# Patient Record
Sex: Male | Born: 1950 | Race: White | Hispanic: No | Marital: Married | Smoking: Current every day smoker
Health system: Southern US, Community
[De-identification: ages and names within clinical notes are randomized; demographics above are authoritative.]

## PROBLEM LIST (undated history)

## (undated) DIAGNOSIS — C7951 Secondary malignant neoplasm of bone: Secondary | ICD-10-CM

## (undated) DIAGNOSIS — C3490 Malignant neoplasm of unspecified part of unspecified bronchus or lung: Secondary | ICD-10-CM

## (undated) DIAGNOSIS — R197 Diarrhea, unspecified: Secondary | ICD-10-CM

## (undated) DIAGNOSIS — Z923 Personal history of irradiation: Secondary | ICD-10-CM

## (undated) DIAGNOSIS — I499 Cardiac arrhythmia, unspecified: Secondary | ICD-10-CM

## (undated) DIAGNOSIS — I639 Cerebral infarction, unspecified: Secondary | ICD-10-CM

## (undated) DIAGNOSIS — I4891 Unspecified atrial fibrillation: Secondary | ICD-10-CM

## (undated) DIAGNOSIS — I82409 Acute embolism and thrombosis of unspecified deep veins of unspecified lower extremity: Secondary | ICD-10-CM

## (undated) DIAGNOSIS — C801 Malignant (primary) neoplasm, unspecified: Secondary | ICD-10-CM

## (undated) DIAGNOSIS — R42 Dizziness and giddiness: Secondary | ICD-10-CM

## (undated) DIAGNOSIS — Z973 Presence of spectacles and contact lenses: Secondary | ICD-10-CM

## (undated) DIAGNOSIS — F1010 Alcohol abuse, uncomplicated: Secondary | ICD-10-CM

## (undated) DIAGNOSIS — G4730 Sleep apnea, unspecified: Secondary | ICD-10-CM

## (undated) DIAGNOSIS — M1990 Unspecified osteoarthritis, unspecified site: Secondary | ICD-10-CM

## (undated) DIAGNOSIS — K219 Gastro-esophageal reflux disease without esophagitis: Secondary | ICD-10-CM

## (undated) HISTORY — PX: TONSILLECTOMY: SUR1361

## (undated) HISTORY — DX: Dizziness and giddiness: R42

## (undated) HISTORY — DX: Cerebral infarction, unspecified: I63.9

---
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
## 1969-07-05 HISTORY — PX: RETINAL DETACHMENT SURGERY: SHX105

## 2000-12-30 ENCOUNTER — Emergency Department (HOSPITAL_COMMUNITY): Admission: EM | Admit: 2000-12-30 | Discharge: 2000-12-31 | Payer: Self-pay | Admitting: Emergency Medicine

## 2001-07-05 HISTORY — PX: ANKLE FRACTURE SURGERY: SHX122

## 2001-08-21 ENCOUNTER — Emergency Department (HOSPITAL_COMMUNITY): Admission: EM | Admit: 2001-08-21 | Discharge: 2001-08-21 | Payer: Self-pay | Admitting: Emergency Medicine

## 2001-08-22 ENCOUNTER — Encounter: Payer: Self-pay | Admitting: Orthopedic Surgery

## 2001-08-22 ENCOUNTER — Ambulatory Visit (HOSPITAL_COMMUNITY): Admission: RE | Admit: 2001-08-22 | Discharge: 2001-08-23 | Payer: Self-pay | Admitting: Orthopedic Surgery

## 2001-11-17 ENCOUNTER — Ambulatory Visit (HOSPITAL_COMMUNITY): Admission: RE | Admit: 2001-11-17 | Discharge: 2001-11-17 | Payer: Self-pay | Admitting: *Deleted

## 2016-07-05 HISTORY — PX: COLONOSCOPY: SHX174

## 2017-04-04 HISTORY — PX: OTHER SURGICAL HISTORY: SHX169

## 2017-07-05 DIAGNOSIS — I639 Cerebral infarction, unspecified: Secondary | ICD-10-CM

## 2017-07-05 HISTORY — DX: Cerebral infarction, unspecified: I63.9

## 2017-08-09 ENCOUNTER — Other Ambulatory Visit: Payer: Self-pay | Admitting: Family Medicine

## 2017-08-09 DIAGNOSIS — R200 Anesthesia of skin: Secondary | ICD-10-CM

## 2017-08-16 ENCOUNTER — Ambulatory Visit
Admission: RE | Admit: 2017-08-16 | Discharge: 2017-08-16 | Disposition: A | Discharge status: Home / Self Care | Payer: Medicare Other | Source: Ambulatory Visit | Attending: Family Medicine | Admitting: Family Medicine

## 2017-08-16 DIAGNOSIS — R200 Anesthesia of skin: Secondary | ICD-10-CM

## 2017-08-18 ENCOUNTER — Other Ambulatory Visit: Payer: Self-pay | Admitting: Family Medicine

## 2017-08-18 DIAGNOSIS — R200 Anesthesia of skin: Secondary | ICD-10-CM

## 2017-09-02 ENCOUNTER — Ambulatory Visit
Admission: RE | Admit: 2017-09-02 | Discharge: 2017-09-02 | Disposition: A | Discharge status: Home / Self Care | Payer: Medicare Other | Source: Ambulatory Visit | Attending: Family Medicine | Admitting: Family Medicine

## 2017-09-02 DIAGNOSIS — R200 Anesthesia of skin: Secondary | ICD-10-CM

## 2017-09-02 MED ORDER — GADOBENATE DIMEGLUMINE 529 MG/ML IV SOLN
17.0000 mL | Freq: Once | INTRAVENOUS | Status: AC | PRN
  Administered 2017-09-02: 17 mL via INTRAVENOUS

## 2017-11-08 ENCOUNTER — Encounter: Payer: Self-pay | Admitting: Diagnostic Neuroimaging

## 2017-11-08 ENCOUNTER — Ambulatory Visit: Payer: Medicare Other | Admitting: Diagnostic Neuroimaging

## 2017-11-08 VITALS — BP 157/84 | HR 52 | Ht 68.0 in | Wt 183.0 lb

## 2017-11-08 DIAGNOSIS — I639 Cerebral infarction, unspecified: Secondary | ICD-10-CM | POA: Diagnosis not present

## 2017-11-08 DIAGNOSIS — I6381 Other cerebral infarction due to occlusion or stenosis of small artery: Secondary | ICD-10-CM

## 2017-11-08 MED ORDER — ASPIRIN 81 MG PO TABS: 81.0000 mg | ORAL_TABLET | Freq: Every day | ORAL | Status: DC

## 2017-11-08 NOTE — Progress Notes (Signed)
[facility]  PATIENT: Albert Perez DOB: 1951/03/04  REFERRING CLINICIAN: Charlynne Cousins, MD HISTORY FROM: patient  REASON FOR VISIT: new consult    HISTORICAL  CHIEF COMPLAINT:  Chief Complaint  Patient presents with  . New Patient (Initial Visit)    Numbness R thumb and R lip  . Referral    Aura Dials, MD  . Room 7    He is here alone    HISTORY OF PRESENT ILLNESS:   67 year old male here for evaluation of right hand and right mouth numbness.  October 2018 patient woke up and had sudden onset of right thumb and right lip numbness.  He had some word finding difficulty.  He did not seek medical attention for this.  Since that time symptoms have persisted but is slightly improved.  No problem with weakness.  No problem with left side of the body.  Patient does endorse at least half pack of cigarettes per day.  He also "drinks too much" alcohol, including beer and hard liquor.  He does have some high blood pressure but is trying to control this with diet and exercise.  He states his systolic blood pressure ranges in the 130 - 150 range.    REVIEW OF SYSTEMS: Full 14 system review of systems performed and negative with exception of: Numbness snoring moles melanoma.  ALLERGIES: Not on File  HOME MEDICATIONS: Outpatient Medications Prior to Visit  Medication Sig Dispense Refill  . aspirin 325 MG tablet Take 325 mg by mouth daily.    . meloxicam (MOBIC) 15 MG tablet Take 15 mg by mouth as needed for pain.     No facility-administered medications prior to visit.     PAST MEDICAL HISTORY: Past Medical History:  Diagnosis Date  . Vertigo     PAST SURGICAL HISTORY: Past Surgical History:  Procedure Laterality Date  . ANKLE FRACTURE SURGERY Right 2003  . COLONOSCOPY  2018  . ELBOW SURGERY Left 1964  . melanoma removal  04/2017   Lt shoulder  . RETINAL DETACHMENT SURGERY Right 1971    FAMILY HISTORY: Family History  Problem Relation Age of  Onset  . Aneurysm Mother   . Colon cancer Brother   . Prostate cancer Brother     SOCIAL HISTORY:  Social History   Socioeconomic History  . Marital status: Married    Spouse name: Not on file  . Number of children: 2  . Years of education: 60  . Highest education level: Some college, no degree  Occupational History  . Not on file  Social Needs  . Financial resource strain: Not on file  . Food insecurity:    Worry: Not on file    Inability: Not on file  . Transportation needs:    Medical: Not on file    Non-medical: Not on file  Tobacco Use  . Smoking status: Current Every Day Smoker    Packs/day: 0.50    Types: Cigarettes, Cigars  . Smokeless tobacco: Never Used  Substance and Sexual Activity  . Alcohol use: Yes    Alcohol/week: 14.4 oz    Types: 24 Cans of beer per week  . Drug use: Not Currently    Comment: marijuana rare  . Sexual activity: Not on file  Lifestyle  . Physical activity:    Days per week: Not on file    Minutes per session: Not on file  . Stress: Not on file  Relationships  . Social connections:    Talks  on phone: Not on file    Gets together: Not on file    Attends religious service: Not on file    Active member of club or organization: Not on file    Attends meetings of clubs or organizations: Not on file    Relationship status: Not on file  . Intimate partner violence:    Fear of current or ex partner: Not on file    Emotionally abused: Not on file    Physically abused: Not on file    Forced sexual activity: Not on file  Other Topics Concern  . Not on file  Social History Narrative   Lives at home with wife, daughter and grandson   Right handed   Caffeine: sodas 2 per day     PHYSICAL EXAM  GENERAL EXAM/CONSTITUTIONAL: Vitals:  Vitals:   11/08/17 1255  BP: (!) 157/84  Pulse: (!) 52  Weight: 183 lb (83 kg)  Height: 5\' 8"  (1.727 m)   Body mass index is 27.83 kg/m. No exam data present  Patient is in no distress; well  developed, nourished and groomed; neck is supple  CARDIOVASCULAR:  Examination of carotid arteries is normal; no carotid bruits  Regular rate and rhythm, no murmurs  Examination of peripheral vascular system by observation and palpation is normal  EYES:  Ophthalmoscopic exam of optic discs and posterior segments is normal; no papilledema or hemorrhages  MUSCULOSKELETAL:  Gait, strength, tone, movements noted in Neurologic exam below  NEUROLOGIC: MENTAL STATUS:  No flowsheet data found.  awake, alert, oriented to person, place and time  recent and remote memory intact  normal attention and concentration  language fluent, comprehension intact, naming intact,   fund of knowledge appropriate  CRANIAL NERVE:   2nd - no papilledema on fundoscopic exam  2nd, 3rd, 4th, 6th - pupils equal and reactive to light, visual fields full to confrontation, extraocular muscles intact, no nystagmus  5th - facial sensation --> DECR IN RIGHT LIP  7th - facial strength symmetric  8th - hearing intact  9th - palate elevates symmetrically, uvula midline  11th - shoulder shrug symmetric  12th - tongue protrusion midline  MOTOR:   normal bulk and tone, full strength in the BUE, BLE  SENSORY:   normal and symmetric to light touch, temperature, vibration; SLIGHTLY DECR IN RIGHT HAND AND RIGHT LIP  COORDINATION:   finger-nose-finger, fine finger movements normal  REFLEXES:   deep tendon reflexes present and symmetric  GAIT/STATION:   narrow based gait; able to walk tandem; romberg is negative    DIAGNOSTIC DATA (LABS, IMAGING, TESTING) - I reviewed patient records, labs, notes, testing and imaging myself where available.  No results found for: WBC, HGB, HCT, MCV, PLT No results found for: NA, K, CL, CO2, GLUCOSE, BUN, CREATININE, CALCIUM, PROT, ALBUMIN, AST, ALT, ALKPHOS, BILITOT, GFRNONAA, GFRAA No results found for: CHOL, HDL, LDLCALC, LDLDIRECT, TRIG, CHOLHDL No  results found for: HGBA1C No results found for: VITAMINB12 No results found for: TSH   09/02/17 MRI brain [I reviewed images myself and agree with interpretation. Also there is probable chronic left thalamic ischemic infarction. -VRP]   Brain: There is no evidence of acute infarct, intracranial hemorrhage, mass, midline shift, or extra-axial fluid collection. The ventricles and sulci are normal for age. Scattered T2 hyperintensities in the cerebral white matter are nonspecific but compatible with mild chronic small vessel ischemic disease. There are 2 small foci of FLAIR hyperintensity versus artifact in the inferomedial cerebellar hemispheres, not confirmed  on standard T2 sequences. Chronic lacunar infarcts versus dilated perivascular spaces are present in the right caudate body and left thalamus. No abnormal enhancement is identified.  Vascular: Major intracranial vascular flow voids are preserved.  Skull and upper cervical spine: Unremarkable bone marrow signal.  Sinuses/Orbits: Right scleral banding. Minimal scattered paranasal sinus mucosal thickening. Clear mastoid air cells.  Other: None.  IMPRESSION: 1. No acute intracranial abnormality. 2. Mild chronic small vessel ischemic disease.     ASSESSMENT AND PLAN  67 y.o. year old male here with new onset of right hand and right mouth numbness, most likely related to chronic left thalamic ischemic infarction, noted on MRI of the brain.  This is likely due to small vessel thrombosis, related to patient's history of hypertension, smoking and alcohol abuse.  Will complete stroke work-up with echocardiogram.  Patient should continue on aspirin 81 mg/day.  Also should treat underlying risk factors including hypertension.  In future could consider sleep study to rule out sleep apnea. Follow up lipid panel with PCP.    Dx:  1. Thalamic stroke (HCC)     PLAN: - aspirin 81mg  daily - follow up BP with PCP; monitor BP at  home - reduce alcohol (caution with withdrawal) - stop cigarette smoking - check TTE  Orders Placed This Encounter  Procedures  . ECHOCARDIOGRAM COMPLETE   Meds ordered this encounter  Medications  . aspirin 81 MG tablet    Sig: Take 1 tablet (81 mg total) by mouth daily.   Return in about 6 months (around 05/11/2018) for with NP Venancio Poisson).  I reviewed images, labs, notes, records myself. I summarized findings and reviewed with patient, for this high risk condition (stroke) requiring high complexity decision making.     Penni Bombard, MD 09/11/5318, 2:33 PM Certified in Neurology, Neurophysiology and Neuroimaging  [facility] [address] [place], [place] [address] [phone]

## 2017-11-08 NOTE — Patient Instructions (Addendum)
-   aspirin 81mg  daily  - follow up BP with PCP; monitor BP at home (omron BP monitor)  - gradually reduce alcohol (caution with withdrawal)  - stop cigarette smoking  - check ultrasound of heart (echocardiogram)

## 2017-11-16 ENCOUNTER — Other Ambulatory Visit: Payer: Self-pay

## 2017-11-16 ENCOUNTER — Ambulatory Visit (HOSPITAL_COMMUNITY): Payer: Medicare Other | Attending: Cardiovascular Disease

## 2017-11-16 DIAGNOSIS — I639 Cerebral infarction, unspecified: Secondary | ICD-10-CM | POA: Diagnosis present

## 2017-11-16 DIAGNOSIS — I6381 Other cerebral infarction due to occlusion or stenosis of small artery: Secondary | ICD-10-CM

## 2017-11-16 DIAGNOSIS — I119 Hypertensive heart disease without heart failure: Secondary | ICD-10-CM | POA: Insufficient documentation

## 2017-11-16 DIAGNOSIS — Z720 Tobacco use: Secondary | ICD-10-CM | POA: Diagnosis not present

## 2017-11-18 ENCOUNTER — Telehealth: Payer: Self-pay | Admitting: *Deleted

## 2017-11-18 NOTE — Telephone Encounter (Signed)
Pt returned RN's call. Please call to advise

## 2017-11-18 NOTE — Telephone Encounter (Signed)
-----   Message from Penni Bombard, MD sent at 11/17/2017  3:17 PM EDT ----- Unremarkable study. No major findings. Please call patient. Continue current plan. -VRP

## 2017-11-18 NOTE — Telephone Encounter (Signed)
LMVM for pt on his home # that Echocardiogram is unremarkable, no major findings per Dr. Leta Baptist.  Continue current plan.  Pt is to return call if questions.

## 2018-05-10 ENCOUNTER — Ambulatory Visit: Payer: Medicare Other | Admitting: Adult Health

## 2018-05-10 ENCOUNTER — Encounter: Payer: Self-pay | Admitting: Adult Health

## 2018-05-10 VITALS — BP 125/73 | HR 56 | Ht 68.0 in | Wt 185.0 lb

## 2018-05-10 DIAGNOSIS — I10 Essential (primary) hypertension: Secondary | ICD-10-CM | POA: Diagnosis not present

## 2018-05-10 DIAGNOSIS — I639 Cerebral infarction, unspecified: Secondary | ICD-10-CM | POA: Diagnosis not present

## 2018-05-10 DIAGNOSIS — E785 Hyperlipidemia, unspecified: Secondary | ICD-10-CM | POA: Diagnosis not present

## 2018-05-10 DIAGNOSIS — I6381 Other cerebral infarction due to occlusion or stenosis of small artery: Secondary | ICD-10-CM

## 2018-05-10 MED ORDER — NICOTINE 14 MG/24HR TD PT24: 14.0000 mg | MEDICATED_PATCH | Freq: Every day | TRANSDERMAL | 0 refills | Status: DC

## 2018-05-10 MED ORDER — BUPROPION HCL ER (SMOKING DET) 150 MG PO TB12: 150.0000 mg | ORAL_TABLET | Freq: Two times a day (BID) | ORAL | 2 refills | Status: DC

## 2018-05-10 NOTE — Progress Notes (Signed)
[facility]  PATIENT: Albert Perez DOB: [birth year]  REFERRING CLINICIAN: Charlynne Cousins, MD HISTORY FROM: patient  REASON FOR VISIT: new consult    HISTORICAL  CHIEF COMPLAINT:  Chief Complaint  Patient presents with  . Follow-up    Follow up for stroke room in back hallway pt alone    HISTORY OF PRESENT ILLNESS:  Initial visit 11/08/2017 VP:  67 year old male here for evaluation of right hand and right mouth numbness.  October 2018 patient woke up and had sudden onset of right thumb and right lip numbness.  He had some word finding difficulty.  He did not seek medical attention for this.  Since that time symptoms have persisted but is slightly improved.  No problem with weakness.  No problem with left side of the body.  Patient does endorse at least half pack of cigarettes per day.  He also "drinks too much" alcohol, including beer and hard liquor.  He does have some high blood pressure but is trying to control this with diet and exercise.  He states his systolic blood pressure ranges in the 130 - 150 range.   Interval history 05/10/2018: Patient is being seen today for follow-up visit.  He did undergo echocardiogram which was unremarkable.  He does say he continues to have right hand numbness but only mild lip numbness with improvement.  He continues to take aspirin 81 mg without side effects of bleeding or bruising.  Continues to take Crestor 5 mg without side effects myalgias.  Blood pressure today satisfactory 125/73.  He does state that he is currently smoking half pack of cigarettes per day and did attempt to use nicotine 21 mg patches but endorses rash despite rotating sites.  While using pack, he was able to decrease from 10 cigarettes daily to 4 cigarettes daily.  He is very motivated to quit.  He does continue to drink alcohol but has greatly decreased.  He does endorse OSA symptoms such as snoring, daytime fatigue, and easily napping.  Denies any prior work-up  with sleep study in the past.  No further concerns at this time.  Denies new or worsening stroke/TIA symptoms.   REVIEW OF SYSTEMS: Full 14 system review of systems performed and negative with exception of: Ringing in ears, leg swelling, snoring, joint pain, muscle cramps, moles, memory loss and numbness  ALLERGIES: No Known Allergies  HOME MEDICATIONS: Outpatient Medications Prior to Visit  Medication Sig Dispense Refill  . aspirin 81 MG tablet Take 1 tablet (81 mg total) by mouth daily.    . Cyanocobalamin (B-12 PO) Take 1,000 mg by mouth.    . Omega-3 1000 MG CAPS Take 2,000 mg by mouth.     . rosuvastatin (CRESTOR) 5 MG tablet Take 5 mg by mouth daily.  2  . meloxicam (MOBIC) 15 MG tablet Take 15 mg by mouth as needed for pain.    . TURMERIC PO Take by mouth.     No facility-administered medications prior to visit.     PAST MEDICAL HISTORY: Past Medical History:  Diagnosis Date  . Stroke ([place])   . Vertigo     PAST SURGICAL HISTORY: Past Surgical History:  Procedure Laterality Date  . ANKLE FRACTURE SURGERY Right 2003  . COLONOSCOPY  2018  . ELBOW SURGERY Left 1964  . melanoma removal  04/2017   Lt shoulder  . RETINAL DETACHMENT SURGERY Right 1971    FAMILY HISTORY: Family History  Problem Relation Age of Onset  . Aneurysm  Mother   . Colon cancer Brother   . Prostate cancer Brother     SOCIAL HISTORY:  Social History   Socioeconomic History  . Marital status: Married    Spouse name: Not on file  . Number of children: 2  . Years of education: 54  . Highest education level: Some college, no degree  Occupational History  . Not on file  Social Needs  . Financial resource strain: Not on file  . Food insecurity:    Worry: Not on file    Inability: Not on file  . Transportation needs:    Medical: Not on file    Non-medical: Not on file  Tobacco Use  . Smoking status: Current Every Day Smoker    Packs/day: 0.50    Types: Cigarettes  . Smokeless  tobacco: Never Used  Substance and Sexual Activity  . Alcohol use: Yes    Alcohol/week: 24.0 standard drinks    Types: 24 Cans of beer per week  . Drug use: Not Currently    Comment: marijuana rare  . Sexual activity: Not on file  Lifestyle  . Physical activity:    Days per week: Not on file    Minutes per session: Not on file  . Stress: Not on file  Relationships  . Social connections:    Talks on phone: Not on file    Gets together: Not on file    Attends religious service: Not on file    Active member of club or organization: Not on file    Attends meetings of clubs or organizations: Not on file    Relationship status: Not on file  . Intimate partner violence:    Fear of current or ex partner: Not on file    Emotionally abused: Not on file    Physically abused: Not on file    Forced sexual activity: Not on file  Other Topics Concern  . Not on file  Social History Narrative   Lives at home with wife, daughter and grandson   Right handed   Caffeine: sodas 2 per day     PHYSICAL EXAM  GENERAL EXAM/CONSTITUTIONAL: Vitals:  Vitals:   05/10/18 1519  BP: 125/73  Pulse: (!) 56  Weight: 185 lb (83.9 kg)  Height: 5\' 8"  (1.727 m)   Body mass index is 28.13 kg/m. No exam data present  Patient is in no distress; well developed, nourished and groomed; neck is supple  CARDIOVASCULAR:  Examination of carotid arteries is normal; no carotid bruits  Regular rate and rhythm, no murmurs  Examination of peripheral vascular system by observation and palpation is normal  EYES:  Ophthalmoscopic exam of optic discs and posterior segments is normal; no papilledema or hemorrhages  MUSCULOSKELETAL:  Gait, strength, tone, movements noted in Neurologic exam below  NEUROLOGIC: MENTAL STATUS:  No flowsheet data found.  awake, alert, oriented to person, place and time  recent and remote memory intact  normal attention and concentration  language fluent, comprehension  intact, naming intact,   fund of knowledge appropriate  CRANIAL NERVE:   2nd - no papilledema on fundoscopic exam  2nd, 3rd, 4th, 6th - pupils equal and reactive to light, visual fields full to confrontation, extraocular muscles intact, no nystagmus  5th - facial sensation intact  7th - facial strength symmetric  8th - hearing intact  9th - palate elevates symmetrically, uvula midline  11th - shoulder shrug symmetric  12th - tongue protrusion midline  MOTOR:   normal bulk  and tone, full strength in the BUE, BLE  SENSORY:   normal and symmetric to light touch, temperature, vibration; SLIGHTLY DECR IN RIGHT HAND   COORDINATION:   finger-nose-finger, fine finger movements normal  REFLEXES:   deep tendon reflexes present and symmetric  GAIT/STATION:   narrow based gait; able to walk tandem; romberg is negative    DIAGNOSTIC DATA (LABS, IMAGING, TESTING) - I reviewed patient records, labs, notes, testing and imaging myself where available.  No results found for: WBC, HGB, HCT, MCV, PLT No results found for: NA, K, CL, CO2, GLUCOSE, BUN, CREATININE, CALCIUM, PROT, ALBUMIN, AST, ALT, ALKPHOS, BILITOT, GFRNONAA, GFRAA No results found for: CHOL, HDL, LDLCALC, LDLDIRECT, TRIG, CHOLHDL No results found for: HGBA1C No results found for: VITAMINB12 No results found for: TSH   09/02/17 MRI brain [I reviewed images myself and agree with interpretation. Also there is probable chronic left thalamic ischemic infarction. -VRP]   Brain: There is no evidence of acute infarct, intracranial hemorrhage, mass, midline shift, or extra-axial fluid collection. The ventricles and sulci are normal for age. Scattered T2 hyperintensities in the cerebral white matter are nonspecific but compatible with mild chronic small vessel ischemic disease. There are 2 small foci of FLAIR hyperintensity versus artifact in the inferomedial cerebellar hemispheres, not confirmed on standard  T2 sequences. Chronic lacunar infarcts versus dilated perivascular spaces are present in the right caudate body and left thalamus. No abnormal enhancement is identified.  Vascular: Major intracranial vascular flow voids are preserved.  Skull and upper cervical spine: Unremarkable bone marrow signal.  Sinuses/Orbits: Right scleral banding. Minimal scattered paranasal sinus mucosal thickening. Clear mastoid air cells.  Other: None.  IMPRESSION: 1. No acute intracranial abnormality. 2. Mild chronic small vessel ischemic disease.     ASSESSMENT AND PLAN  67 y.o. year old male who is being seen today for follow-up of left thalamic infarct most likely secondary to small vessel disease.  Vascular risk factors include HTN, HLD, tobacco use and alcohol use.  He returns today for follow-up evaluation and overall has been stable with residual right hand numbness but otherwise has been stable without new or worsening stroke/TIA symptoms.   Dx:  1. Thalamic stroke ([place])   2. Essential hypertension   3. Hyperlipidemia with target LDL less than 70     -Continue aspirin 81 mg daily  and Crestor for secondary stroke prevention -F/u with PCP regarding your HLD and HTN management -Advised to trial nicotine patches 14 mg to see if lesser dose causes skin irritation as well.  Also recommended to start Zyban 150 mg daily for 3 days and then increase to 150 mg twice daily to assist with smoking cessation -Referral placed for sleep study to [facility] -Advised to continue to stay active and maintain a healthy diet -continue to monitor BP at home -Maintain strict control of hypertension with blood pressure goal below 130/90, diabetes with hemoglobin A1c goal below 6.5% and cholesterol with LDL cholesterol (bad cholesterol) goal below 70 mg/dL. I also advised the patient to eat a healthy diet with plenty of whole grains, cereals, fruits and vegetables, exercise regularly and maintain ideal  body weight.  Follow up in 6 months or call earlier if needed  Greater than 50% of time during this 25 minute visit was spent on counseling,explanation of diagnosis of left thalamic infarct, reviewing risk factor management of HTN, HLD, alcohol use and continued tobacco use, planning of further management, discussion with patient and family and coordination of care  Venancio Poisson, AGNP-BC  [facility] [address] [place], [place] [address]  Phone [phone] Fax [phone] Note: This document was prepared with digital dictation and possible smart phrase technology. Any transcriptional errors that result from this process are unintentional.

## 2018-05-10 NOTE — Patient Instructions (Signed)
Continue aspirin 81 mg daily  and Crestor 5mg   for secondary stroke prevention  Continue to follow up with PCP regarding cholesterol and blood pressure management   Use nicotine patches 14mg  along with starting Zyban 150mg  daily for 3 days and then increase to 150mg  twice a day. By week 2, you should stop smoking at this time.   You will be call to schedule sleep apnea testing  Follow up with orthopedic concerning knee pain   Continue to monitor blood pressure at home  Maintain strict control of hypertension with blood pressure goal below 130/90, diabetes with hemoglobin A1c goal below 6.5% and cholesterol with LDL cholesterol (bad cholesterol) goal below 70 mg/dL. I also advised the patient to eat a healthy diet with plenty of whole grains, cereals, fruits and vegetables, exercise regularly and maintain ideal body weight.  Followup in the future with me in 6 months or call earlier if needed       Thank you for coming to see [place] at [facility]. I hope we have been able to provide you high quality care today.  You may receive a patient satisfaction survey over the next few weeks. We would appreciate your feedback and comments so that we may continue to improve ourselves and the health of our patients.

## 2018-05-11 ENCOUNTER — Ambulatory Visit: Payer: Medicare Other | Admitting: Adult Health

## 2018-05-23 NOTE — Progress Notes (Signed)
I reviewed note and agree with plan.   VIKRAM R. PENUMALLI, MD  Certified in Neurology, Neurophysiology and Neuroimaging  [facility] [address], [address] [place], [place] [address] [phone]   

## 2018-07-19 ENCOUNTER — Ambulatory Visit: Payer: Medicare Other | Admitting: Neurology

## 2018-07-19 ENCOUNTER — Encounter: Payer: Self-pay | Admitting: Neurology

## 2018-07-19 VITALS — BP 155/75 | HR 54 | Ht 68.5 in | Wt 186.0 lb

## 2018-07-19 DIAGNOSIS — I10 Essential (primary) hypertension: Secondary | ICD-10-CM | POA: Diagnosis not present

## 2018-07-19 DIAGNOSIS — I6381 Other cerebral infarction due to occlusion or stenosis of small artery: Secondary | ICD-10-CM

## 2018-07-19 DIAGNOSIS — I639 Cerebral infarction, unspecified: Secondary | ICD-10-CM

## 2018-07-19 DIAGNOSIS — F10182 Alcohol abuse with alcohol-induced sleep disorder: Secondary | ICD-10-CM

## 2018-07-19 DIAGNOSIS — R0683 Snoring: Secondary | ICD-10-CM

## 2018-07-19 DIAGNOSIS — E785 Hyperlipidemia, unspecified: Secondary | ICD-10-CM | POA: Diagnosis not present

## 2018-07-19 DIAGNOSIS — Z8679 Personal history of other diseases of the circulatory system: Secondary | ICD-10-CM | POA: Insufficient documentation

## 2018-07-19 NOTE — Progress Notes (Signed)
SLEEP MEDICINE CLINIC   Provider:  Larey Seat, MD   Primary Care Physician:  Albert Dials, MD   Referring Provider: Nolberto Perez , MD and Albert Maudlin, NP    Chief Complaint  Patient presents with  . New Patient (Initial Visit)    pt alone, rm 10. pt states that he tends to go to bed late, states that he has snored for years. pt states he feels well rested when he wakes up. denies apnea that he knows of. goes to bed 1 am and wakes up 7:30 am.    HPI:  Albert Perez is a 68 y.o. male patient, who had suffered a stroke in 2019 and was seen in follow up by NP Albert Perez and Dr Albert Perez in 05-2018.  He is seen here on 07-19-2018 in a referral to evaluate him for sleep apnea.  The patient was seen by Albert Perez on 08 Nov 2017 being evaluated for right hand and right angle of the mouth numbness.  His symptoms were related to a stroke he also had some word finding difficulties initially but did not seek medical attention he did not feel any extremity weakness, gait instability or cognitive impairment.  Dr. Leta Perez noted that the patient does smoke and also drinks about his blood pressure at the time of his visit was slightly elevated.  On 10 May 2018 there was a follow-up visit his echocardiogram had meanwhile been obtained and was unremarkable.  Hand and lip numbness had slightly further improved he continue to take aspirin 81 mg Crestor 5 mg, blood pressure was well controlled 125/73 mmHg.  He continues to smoke but is down to half a pack a day and he has used nicotine patches.  He also has reduced his alcohol intake.  He did report symptoms of snoring, daytime fatigue and easily falling asleep when not stimulated or physically active.  He had no further sleep studies or sleep evaluations prior and was referred based on the above named concerns.  An MRI of the brain was obtained on 02 September 2017 which showed a left thalamic ischemic infarction and chronic lacunar infarcts.  The  right caudate body was also involved.  No recent contrast enhancement.  The impression was that of mild chronic small vessel ischemic disease with lacunar infarcts and a left thalamic ischemic stroke.     Sleep habits are as follows: He tinkers with electriclighting fixtures and with cars as a hobby. The garage is his evening workshop. 1972 Oldmobile Cutless convertible, and a [place].bedtime is around 1 - 1.30 AM-  He prays hard every night and sleeps well- the bedroom is cool, comfy and dark and quiet, no electronics are running. He sleeps prone- one one pillow. He dreams every night and vividly and a lot, lively- hard to differentiate reality from dream. He wakes up once for the bathroom. He rise at 7.20 and starts his day, refreshed.  No intentional naps, no inadvertent naps either.  sleeps 6 hours or less. On sundays he sleeps in at 9 AM.    Sleep medical history and family sleep history: HTN, alcohol and tobacco overuse/ abuse.  Had atrial fib in 1999, and quit coffee after that.  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  Symptoms also include vivid dreams.    Social history: former Armed forces operational officer, now Freight forwarder, used to own  a racing team, motorcycle team-  Married with 2 adult children - his wife gets to bed at 9 Pm and rises at 5.45 AM-  works at [employer].  He tinkers with electriclighting fixtures and with cars as a hobby. The garage is his evening workshop. 1972 Oldmobile Cutless convertible, and a [place].  1/2 pack  per day, no iced tea but pepsi sodas, no coffee, alcohol: mixed drinks and beer.   Review of Systems: Out of a complete 14 system review, the patient complains of only the following symptoms, and all other reviewed systems are negative.  vivid dreams, stroke.  The overall [name] states that he has been snoring forever,  since he can remember, and his wife has never known him not to snore, and all his family snores.  Epworth sleepiness score:5  , Fatigue severity score; 14 points  , depression score : 2/ 15  I am very happy   Symptoms endorsed were swelling in the legs, tinnitus or ringing in the ears, loud snoring, joint pain without joint swelling, the feeling of not getting enough sleep, numbness in the right hand but only part of the right hand is involved.  Hands sleepiness and snoring.  Also reviewed his medication list and his family history is a strong family history of snoring diabetes and prostate cancer.   Social History   Socioeconomic History  . Marital status: Married    Spouse name: Not on file  . Number of children: 2  . Years of education: 26  . Highest education level: Some college, no degree  Occupational History  . Not on file  Social Needs  . Financial resource strain: Not on file  . Food insecurity:    Worry: Not on file    Inability: Not on file  . Transportation needs:    Medical: Not on file    Non-medical: Not on file  Tobacco Use  . Smoking status: Current Every Day Smoker    Packs/day: 0.50    Types: Cigarettes  . Smokeless tobacco: Never Used  Substance and Sexual Activity  . Alcohol use: Yes    Alcohol/week: 24.0 standard drinks    Types: 24 Cans of beer per week  . Drug use: Not Currently    Comment: marijuana rare  . Sexual activity: Not on file  Lifestyle  . Physical activity:    Days per week: Not on file    Minutes per session: Not on file  . Stress: Not on file  Relationships  . Social connections:    Talks on phone: Not on file    Gets together: Not on file    Attends religious service: Not on file    Active member of club or organization: Not on file    Attends meetings of clubs or organizations: Not on file    Relationship status: Not on file  . Intimate partner violence:    Fear of current or ex partner: Not on file    Emotionally abused: Not  on file    Physically abused: Not on file    Forced sexual activity: Not on file  Other Topics Concern  . Not on file  Social History Narrative   Lives at home with wife, daughter and grandson   Right handed   Caffeine: sodas 2 per day    Family History  Problem Relation Age of Onset  . Aneurysm Mother   . Colon cancer Brother   . Prostate cancer Brother  Past Medical History:  Diagnosis Date  . Stroke ([place])   . Vertigo     Past Surgical History:  Procedure Laterality Date  . ANKLE FRACTURE SURGERY Right 2003  . COLONOSCOPY  2018  . ELBOW SURGERY Left 1964  . melanoma removal  04/2017   Lt shoulder  . RETINAL DETACHMENT SURGERY Right 1971    Current Outpatient Medications  Medication Sig Dispense Refill  . aspirin 81 MG tablet Take 1 tablet (81 mg total) by mouth daily.    . Cyanocobalamin (B-12 PO) Take 1,000 mg by mouth.    . Omega-3 1000 MG CAPS Take 2,000 mg by mouth.     . rosuvastatin (CRESTOR) 5 MG tablet Take 5 mg by mouth daily.  2  . buPROPion (ZYBAN) 150 MG 12 hr tablet Take 1 tablet (150 mg total) by mouth 2 (two) times daily. Take 150mg  daily for the first 3 days. After 3 days, take 150mg  twice daily. (Patient not taking: Reported on 07/19/2018) 60 tablet 2   No current facility-administered medications for this visit.     Allergies as of 07/19/2018  . (No Known Allergies)    Vitals: BP (!) 155/75   Pulse (!) 54   Ht 5' 8.5" (1.74 m)   Wt 186 lb (84.4 kg)   BMI 27.87 kg/m  Last Weight:  Wt Readings from Last 1 Encounters:  07/19/18 186 lb (84.4 kg)   EXH:BZJI mass index is 27.87 kg/m.     Last Height:   Ht Readings from Last 1 Encounters:  07/19/18 5' 8.5" (1.74 m)    Physical exam:  General: The patient is awake, alert and appears not in acute distress. The patient is well groomed. Head: Normocephalic, atraumatic. Neck is supple. Mallampati -   With a swollen , red uvula. Thick uvula.  neck circumference:16. 5 , thick tongue, .  Nasal airflow congested. Cardiovascular:  Regular rate and rhythm , without  murmurs or carotid bruit, and without distended neck veins.  Had irregular heart rate .  Peripheral pulses palpable.  Respiratory: Lungs are clear to auscultation,but  bronchial rhonchi. . Skin:  Facial and palmar erythema. Without evidence of edema, or rash Trunk: BMI is 27.9. The patient's posture is relaxed.   Neurologic exam : The patient is awake and alert, oriented to place and time.    Attention span & concentration ability appears normal.  Speech is fluent,  without dysarthria, dysphonia or aphasia.  Mood and affect are aloof.  Cranial nerves: Pupils are equal and briskly reactive to light.  Extraocular movements  in vertical and horizontal planes intact and without nystagmus. Visual fields by finger perimetry are intact. Hearing to finger rub intact.   Facial sensation reduced at the right angle of his mouth.  Facial motor strength is asymmetric, there is a clear droop pf the right mouth.  but tongue and uvula move midline. Shoulder shrug was symmetrical.   Motor exam:  Normal tone, muscle bulk and symmetric strength in all extremities.  Sensory:  Fine touch, pinprick and vibration were tested in all extremities. Proprioception tested in the upper extremities was normal.  Coordination: Rapid alternating movements in the fingers/hands was normal. Finger-to-nose maneuver  normal without evidence of ataxia, dysmetria or tremor.  Gait and station: Patient walks without assistive device  Deep tendon reflexes: in the  upper and lower extremities are symmetric.   Assessment:  After physical and neurologic examination, review of laboratory studies,  Personal review of imaging studies, reports of  other /same  Imaging studies, results of polysomnography and / or neurophysiology testing and pre-existing records as far as provided in visit., my assessment is   1)  patient with no evident awareness of sleep  problems, life long snorer. Wife reported apneas. Alcohol certainly can contribute to snoring volume, and he may have developed  COPD. No wheezing but rhonci.   2)  Remote stroke before March 2019-  With residual findings in sensory and facial motor.   3) alcohol and nicotine abuse , ongoing.    The patient was advised of the nature of the diagnosed disorder , the treatment options and the  risks for general health and wellness arising from not treating the condition.   I spent more than 45 minutes of face to face time with the patient.  Greater than 50% of time was spent in counseling and coordination of care. We have discussed the diagnosis and differential and I answered the patient's questions.    Plan:  Treatment plan and additional workup :   He will need to use a HST device testing, per [facility] guidelines. I have no doubt that he will present with a high RDI and he has risk factors for OSA and hypoxemia.  Return to primary physician for  Smoking cessation and alcohol counseling.       Albert Seat, MD 2/87/6811, 57:26 AM  Certified in Neurology by ABPN Certified in [place] by [facility] [address], [place], [place] [address]

## 2018-08-09 ENCOUNTER — Ambulatory Visit (INDEPENDENT_AMBULATORY_CARE_PROVIDER_SITE_OTHER): Payer: Medicare Other | Admitting: Neurology

## 2018-08-09 DIAGNOSIS — G4733 Obstructive sleep apnea (adult) (pediatric): Secondary | ICD-10-CM | POA: Diagnosis not present

## 2018-08-09 DIAGNOSIS — I639 Cerebral infarction, unspecified: Secondary | ICD-10-CM

## 2018-08-09 DIAGNOSIS — E785 Hyperlipidemia, unspecified: Secondary | ICD-10-CM

## 2018-08-09 DIAGNOSIS — I10 Essential (primary) hypertension: Secondary | ICD-10-CM

## 2018-08-09 DIAGNOSIS — I6381 Other cerebral infarction due to occlusion or stenosis of small artery: Secondary | ICD-10-CM

## 2018-08-09 DIAGNOSIS — F10182 Alcohol abuse with alcohol-induced sleep disorder: Secondary | ICD-10-CM

## 2018-08-09 DIAGNOSIS — R0683 Snoring: Secondary | ICD-10-CM

## 2018-08-18 DIAGNOSIS — G4733 Obstructive sleep apnea (adult) (pediatric): Secondary | ICD-10-CM | POA: Insufficient documentation

## 2018-08-18 NOTE — Procedures (Signed)
NAME:   Albert Perez                                                                                DOB: 02/26/2020 MEDICAL RECORD No:  [id number]                                                       DOS:  08/09/2018 REFERRING PHYSICIAN: Andrey Spearman, MD STUDY PERFORMED: Home Sleep Test on Watch Pat HISTORY: Albert Perez is a 68 y.o. male patient, who suffered a stroke in 2019 and was seen in follow up by NP Vanschaick and Dr. Leta Baptist in 05-10-2018. He is seen here on 07-19-2018 in a referral to evaluate him for sleep apnea. The patient was seen by Ronette Deter on 08 Nov 2017 being evaluated for right hand and right angle of the mouth numbness.  His symptoms were related to a stroke. He also had some word finding difficulties initially but did not seek medical attention he did not feel any extremity weakness, gait instability or cognitive impairment.  Dr. Leta Baptist noted that the patient does smoke and also drinks about his blood pressure at the time of his visit was slightly elevated.  On 10 May 2018 there was a follow-up visit his echocardiogram had meanwhile been obtained and was unremarkable.  He continued to smoke but cut down to half a pack a day and he has used nicotine patches. He also had reduced his alcohol intake.  He did report symptoms of snoring, daytime fatigue and easily falling asleep when not stimulated or physically active.  He had no further sleep studies or sleep evaluations prior and was referred based on the above named concerns.  An MRI of the brain from 02 September 2017 showed a left thalamic ischemic infarction and chronic lacunar infarcts. The right caudate body was also involved. Epworth sleepiness score: 5/24, Fatigue severity score; 14/63 points, BMI: 27.4 kg/m2  STUDY RESULTS:  Total Recording Time: 7 h 40 mins; Estimated Sleep Time:  5 h 35 mins Total Apnea/Hypopnea Index (AHI):  49.0 /h; RDI:  52.6/h; REM AHI:  60.8/h Average Oxygen Saturation:    94%; Lowest Oxygen  Saturation:   84%  Total Time in Oxygen Saturation below 89 %:  3.6 minutes  Average Heart Rate:  51 bpm, max 85 bpm.  IMPRESSION: Severe Obstructive Sleep Apnea with mild REM accentuation, loud snoring, and clinically insignificant oxygen desaturation.  RECOMMENDATION: Treatment options include CPAP, dental device and Inspire device. Treatment of first choice is autotitration CPAP therapy, 5-16 cm water, 2 cm EPR and mask of patient's choice and comfort, heated humidity. If not tolerated may change to the alternative treatments named above.  I certify that I have reviewed the raw data recording prior to the issuance of this report in accordance with the standards of the American Academy of Sleep Medicine (AASM). Larey Seat, M.D.   08-18-2018   Medical Director of [facility] at [facility], accredited by the AASM. Diplomat of the ABPN and ABSM.

## 2018-08-18 NOTE — Addendum Note (Signed)
Addended by: Larey Seat on: 08/18/2018 12:09 PM   Modules accepted: Orders

## 2018-08-22 ENCOUNTER — Telehealth: Payer: Self-pay | Admitting: Neurology

## 2018-08-22 NOTE — Telephone Encounter (Signed)
Pt returned call. I advised pt that Dr. Brett Fairy reviewed their sleep study results and found that pt has sleep apnea. Dr. Brett Fairy recommends that pt auto CPAP. I reviewed PAP compliance expectations with the pt. Pt is agreeable to starting a CPAP. I advised pt that an order will be sent to a DME, [facility], and [facility] will call the pt within about one week after they file with the pt's insurance. [facility] will show the pt how to use the machine, fit for masks, and troubleshoot the CPAP if needed. A follow up appt was made for insurance purposes with Debbora Presto, NP, April 21,2020 at 10:30 am  Pt verbalized understanding to arrive 15 minutes early and bring their CPAP. A letter with all of this information in it will be mailed to the pt as a reminder. I verified with the pt that the address we have on file is correct. Pt verbalized understanding of results. Pt had no questions at this time but was encouraged to call back if questions arise. I have sent the order to [facility] and have received confirmation that they have received the order.

## 2018-08-22 NOTE — Telephone Encounter (Signed)
-----   Message from Larey Seat, MD sent at 08/18/2018 12:09 PM EST ----- IMPRESSION: Severe Obstructive Sleep Apnea ( AHI 49/h) with mild REM  accentuation, loud snoring, and clinically insignificant oxygen  desaturation.  RECOMMENDATION: Treatment options include CPAP, dental device and  Inspire device. Treatment of first choice is autotitration CPAP therapy, 5-16 cm  water, 2 cm EPR and mask of patient's choice and comfort, heated  humidity. If CPAP is not tolerated may change to the alternative treatments named  above. Please educate about need for smoking cessation.  Cc Dr Sheryn Bison, MD

## 2018-08-22 NOTE — Telephone Encounter (Signed)
Called patient to discuss sleep study results. No answer at this time. LVM for the patient to call back.   

## 2018-10-23 ENCOUNTER — Telehealth: Payer: Self-pay | Admitting: *Deleted

## 2018-10-23 NOTE — Telephone Encounter (Signed)
LMVM for pt and wife to convert appt to VV webex, this is 3rd message left.  (also update chart).

## 2018-10-23 NOTE — Telephone Encounter (Signed)
VV webex made, email sent.

## 2018-10-23 NOTE — Telephone Encounter (Signed)
Pt returned call and stated he doesn't know how to do a video visit , I discussed how it works and patient is agreeable and has given consent  Email- [email or web address]

## 2018-10-23 NOTE — Telephone Encounter (Signed)
Spoke to pt, he was at work and not able to update chart.

## 2018-10-24 ENCOUNTER — Ambulatory Visit (INDEPENDENT_AMBULATORY_CARE_PROVIDER_SITE_OTHER): Payer: Medicare Other | Admitting: Family Medicine

## 2018-10-24 ENCOUNTER — Encounter: Payer: Self-pay | Admitting: Family Medicine

## 2018-10-24 ENCOUNTER — Other Ambulatory Visit: Payer: Self-pay

## 2018-10-24 DIAGNOSIS — G4733 Obstructive sleep apnea (adult) (pediatric): Secondary | ICD-10-CM

## 2018-10-24 DIAGNOSIS — I6381 Other cerebral infarction due to occlusion or stenosis of small artery: Secondary | ICD-10-CM

## 2018-10-24 DIAGNOSIS — I639 Cerebral infarction, unspecified: Secondary | ICD-10-CM | POA: Diagnosis not present

## 2018-10-24 NOTE — Progress Notes (Signed)
PATIENT: Albert Perez DOB: [birth year]  REASON FOR VISIT: follow up HISTORY FROM: patient  Virtual Visit via Telephone Note  I connected with [name] on 10/24/18 at 10:30 AM EDT by telephone and verified that I am speaking with the correct person using two identifiers.   I discussed the limitations, risks, security and privacy concerns of performing an evaluation and management service by telephone and the availability of in person appointments. I also discussed with the patient that there may be a patient responsible charge related to this service. The patient expressed understanding and agreed to proceed.   History of Present Illness:  10/24/18 Albert Perez is a 68 y.o. male for follow up os OSA on CPAP.  Mr. Albert Perez admits that he is not using CPAP in quite some time.  He cannot even remember what mask he was using.  He states that he was having a very difficult time getting accustomed to using the machine at night.  Download report is unavailable for review today.  He states that he has not used his machine at all over the last 30 days.    History (copied from Dr Dohmeier's note on 07/19/2018)  HPI:  Albert Perez is a 68 y.o. male patient, who had suffered a stroke in 2019 and was seen in follow up by NP VanSchaick and Dr Leta Baptist in 05-2018.  He is seen here on 07-19-2018 in a referral to evaluate him for sleep apnea.  The patient was seen by Ronette Deter on 08 Nov 2017 being evaluated for right hand and right angle of the mouth numbness.  His symptoms were related to a stroke he also had some word finding difficulties initially but did not seek medical attention he did not feel any extremity weakness, gait instability or cognitive impairment.  Dr. Leta Baptist noted that the patient does smoke and also drinks about his blood pressure at the time of his visit was slightly elevated.  On 10 May 2018 there was a follow-up visit his echocardiogram had meanwhile been obtained and  was unremarkable.  Hand and lip numbness had slightly further improved he continue to take aspirin 81 mg Crestor 5 mg, blood pressure was well controlled 125/73 mmHg.  He continues to smoke but is down to half a pack a day and he has used nicotine patches.  He also has reduced his alcohol intake.  He did report symptoms of snoring, daytime fatigue and easily falling asleep when not stimulated or physically active.  He had no further sleep studies or sleep evaluations prior and was referred based on the above named concerns.  An MRI of the brain was obtained on 02 September 2017 which showed a left thalamic ischemic infarction and chronic lacunar infarcts.  The right caudate body was also involved.  No recent contrast enhancement.  The impression was that of mild chronic small vessel ischemic disease with lacunar infarcts and a left thalamic ischemic stroke.     Sleep habits are as follows: He tinkers with electriclighting fixtures and with cars as a hobby. The garage is his evening workshop. 1972 Oldmobile Cutless convertible, and a [place].bedtime is around 1 - 1.30 AM-  He prays hard every night and sleeps well- the bedroom is cool, comfy and dark and quiet, no electronics are running. He sleeps prone- one one pillow. He dreams every night and vividly and a lot, lively- hard to differentiate reality from dream. He wakes up once for the bathroom. He rise  at 7.20 and starts his day, refreshed.  No intentional naps, no inadvertent naps either.  sleeps 6 hours or less. On sundays he sleeps in at 9 AM.    Sleep medical history and family sleep history: HTN, alcohol and tobacco overuse/ abuse.  Had atrial fib in 1999, and quit coffee after that.  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  Symptoms also include vivid dreams.    Social history: former Armed forces operational officer,  now Freight forwarder, used to own a Adult nurse, motorcycle team-  Married with 2 adult children - his wife gets to bed at 9 Pm and rises at 5.45 AM-  works at [employer].  He tinkers with electriclighting fixtures and with cars as a hobby. The garage is his evening workshop. 1972 Oldmobile Cutless convertible, and a [place].  1/2 pack  per day, no iced tea but pepsi sodas, no coffee, alcohol: mixed drinks and beer.    Observations/Objective:  Generalized: Well developed, in no acute distress  Mentation: Alert oriented to time, place, history taking. Follows all commands speech and language fluent   Assessment and Plan:  68 y.o. year old male  has a past medical history of Stroke ([place]) and Vertigo. here with    ICD-10-CM   1. Severe obstructive sleep apnea-hypopnea syndrome G47.33 For home use only DME continuous positive airway pressure (CPAP)  2. Thalamic stroke ([place]) I63.9    We have had a long detailed discussion regarding risk factors for untreated sleep apnea including stroke and heart attack.  We have discussed the details of his previous sleep study including apneic events.  We have reviewed his diagnosis of severe sleep apnea.  I have educated Mr. Albert Perez that he is at an increased risk for stroke due to previous history as well as untreated sleep apnea.  I have educated him on the importance of treating sleep apnea.  After discussion of multiple options, Mr. Albert Perez has decided that he wishes to give CPAP therapy another try.  He agrees to start therapy today.  I have advised nightly use and for greater than 4 hours each night.  I will also send an order to the DME company to refit him for a nasal mask.  I feel that he may do better with this device.  I have advised very close follow-up to ensure that he stays compliant with therapy.  I recommend we see him back in 4 weeks.  He verbalizes understanding and agreement with this plan.  Orders Placed This Encounter  Procedures   For home  use only DME continuous positive airway pressure (CPAP)    Refit mask please, consider nasal mask    Order Specific Question:   Patient has OSA or probable OSA    Answer:   Yes    Order Specific Question:   Is the patient currently using CPAP in the home    Answer:   Yes    Order Specific Question:   Settings    Answer:   Other see comments    Order Specific Question:   CPAP supplies needed    Answer:   Mask, headgear, cushions, filters, heated tubing and water chamber    No orders of the defined types were placed in this encounter.    Follow Up Instructions:  I discussed the assessment and treatment plan with the patient. The  patient was provided an opportunity to ask questions and all were answered. The patient agreed with the plan and demonstrated an understanding of the instructions.   The patient was advised to call back or seek an in-person evaluation if the symptoms worsen or if the condition fails to improve as anticipated.  I provided 30 minutes of non-face-to-face time during this encounter.  Mr. Troy is located at his place of business during video conference.  Provider is located at her place of residence.  Liane Comber, RN help to facilitate visit.   Debbora Presto, NP

## 2018-10-25 ENCOUNTER — Telehealth: Payer: Self-pay | Admitting: Family Medicine

## 2018-10-25 NOTE — Telephone Encounter (Signed)
Called patient and got him scheduled for a 4 week follow-up per Amy on 5/20. I advised patient on the phone that we will assume that this will be treated as a virtual visit and patient should expect to go through the same process as his appointment yesterday. I advised patient that if this changes we will let him know. Patient agreed to do a virtual visit again for this appointment.

## 2018-10-25 NOTE — Telephone Encounter (Signed)
Noted  

## 2018-11-08 ENCOUNTER — Telehealth: Payer: Self-pay

## 2018-11-08 NOTE — Telephone Encounter (Signed)
Left vm for patient that his visit with JEssica NP for stroke will be video due to COVID 19. We will need his verbal consent to do video and to file insurance. We will cell phone number to text link to.

## 2018-11-09 ENCOUNTER — Ambulatory Visit (INDEPENDENT_AMBULATORY_CARE_PROVIDER_SITE_OTHER): Payer: Medicare Other | Admitting: Adult Health

## 2018-11-09 ENCOUNTER — Encounter: Payer: Self-pay | Admitting: Adult Health

## 2018-11-09 ENCOUNTER — Other Ambulatory Visit: Payer: Self-pay

## 2018-11-09 DIAGNOSIS — G4733 Obstructive sleep apnea (adult) (pediatric): Secondary | ICD-10-CM

## 2018-11-09 DIAGNOSIS — E785 Hyperlipidemia, unspecified: Secondary | ICD-10-CM

## 2018-11-09 DIAGNOSIS — Z720 Tobacco use: Secondary | ICD-10-CM

## 2018-11-09 DIAGNOSIS — I639 Cerebral infarction, unspecified: Secondary | ICD-10-CM | POA: Diagnosis not present

## 2018-11-09 DIAGNOSIS — I10 Essential (primary) hypertension: Secondary | ICD-10-CM

## 2018-11-09 DIAGNOSIS — I6381 Other cerebral infarction due to occlusion or stenosis of small artery: Secondary | ICD-10-CM

## 2018-11-09 NOTE — Telephone Encounter (Signed)
I spoke with patient and receive verbal consent and to file insurance. I stated provider will text him a link to his cell phone 5-10 minutes prior to join and he will click on it.  You will be asked to enter your name (First and Last) and then it will put you into Jessica NP "waiting room." Janett Billow NP should be with you at your scheduled appointment time, but please be patient because he may be running a few minutes behind. Pt verbalized understanding.

## 2018-11-09 NOTE — Progress Notes (Signed)
[facility]  PATIENT: Albert Perez DOB: 05/11/1951  REFERRING CLINICIAN: Charlynne Cousins, MD HISTORY FROM: patient   Virtual Visit via Video Note  I connected with Albert Perez on 11/09/18 at  1:45 PM EDT by a video enabled telemedicine application located remotely in my own home and verified that I am speaking with the correct person using two identifiers who was located at their own home.   I discussed the limitations of evaluation and management by telemedicine and the availability of in person appointments. The patient expressed understanding and agreed to proceed.     HISTORY OF PRESENT ILLNESS:  Initial visit 11/08/2017 VP:  Albert Perez is a 68 y.o. male was being followed in this office for chronic left thalamic infarct with right hand and right mouth numbness.  He was scheduled today for stroke follow-up office visit but due to COVID-19 safety precautions, visit transition to telemedicine via [email or web address] with patient's consent.  Has been stable from a stroke standpoint with mild residual right thumb numbness but denies any other residual numbness. He continues on aspirin 81 mg and rosuvastatin 5 mg without side effects.  Blood pressure 130-135.  Tobacco use continues with about 8-10 cigarettes per day. He continues to try to decrease amt aware he needs to quit completely. Recent weight loss. Recently underwent OSA study with diagnosis of severe OSA with recommendation of CPAP initiation.  He did have initial CPAP compliance visit on 10/24/2018 but he apparently has not been compliant with CPAP machine and has not used the machine at all over the past 30 days prior.  He did agree other visit to start use of CPAP that evening but admits that he did not restart.  He feels as though there is too much moisture coming out of the mask and does not feel it is safe to be "breathing that into my lungs" and continues to have difficulty tolerating mask.  He did not follow-up with DME  company as recommended.  He states he is considering "donating my machine to somebody who needs it more".  No further concerns at this time.    REVIEW OF SYSTEMS: Full 14 system review of systems performed and negative with exception of: Numbness, apnea and snoring  ALLERGIES: No Known Allergies  HOME MEDICATIONS: Outpatient Medications Prior to Visit  Medication Sig Dispense Refill   aspirin 81 MG tablet Take 1 tablet (81 mg total) by mouth daily.     buPROPion (ZYBAN) 150 MG 12 hr tablet Take 1 tablet (150 mg total) by mouth 2 (two) times daily. Take 150mg  daily for the first 3 days. After 3 days, take 150mg  twice daily. (Patient not taking: Reported on 07/19/2018) 60 tablet 2   Cyanocobalamin (B-12 PO) Take 1,000 mg by mouth.     Omega-3 1000 MG CAPS Take 2,000 mg by mouth.      rosuvastatin (CRESTOR) 5 MG tablet Take 5 mg by mouth daily.  2   No facility-administered medications prior to visit.     PAST MEDICAL HISTORY: Past Medical History:  Diagnosis Date   Stroke [facility])    Vertigo     PAST SURGICAL HISTORY: Past Surgical History:  Procedure Laterality Date   ANKLE FRACTURE SURGERY Right 2003   COLONOSCOPY  2018   ELBOW SURGERY Left 1964   melanoma removal  04/2017   Lt shoulder   RETINAL DETACHMENT SURGERY Right 1971    FAMILY HISTORY: Family History  Problem Relation Age of Onset  Aneurysm Mother    Colon cancer Brother    Prostate cancer Brother     SOCIAL HISTORY:  Social History   Socioeconomic History   Marital status: Married    Spouse name: Not on file   Number of children: 2   Years of education: 14   Highest education level: Some college, no degree  Occupational History   Not on file  Social Needs   Financial resource strain: Not on file   Food insecurity:    Worry: Not on file    Inability: Not on file   Transportation needs:    Medical: Not on file    Non-medical: Not on file  Tobacco Use   Smoking status:  Current Every Day Smoker    Packs/day: 0.50    Types: Cigarettes   Smokeless tobacco: Never Used  Substance and Sexual Activity   Alcohol use: Yes    Alcohol/week: 24.0 standard drinks    Types: 24 Cans of beer per week   Drug use: Not Currently    Comment: marijuana rare   Sexual activity: Not on file  Lifestyle   Physical activity:    Days per week: Not on file    Minutes per session: Not on file   Stress: Not on file  Relationships   Social connections:    Talks on phone: Not on file    Gets together: Not on file    Attends religious service: Not on file    Active member of club or organization: Not on file    Attends meetings of clubs or organizations: Not on file    Relationship status: Not on file   Intimate partner violence:    Fear of current or ex partner: Not on file    Emotionally abused: Not on file    Physically abused: Not on file    Forced sexual activity: Not on file  Other Topics Concern   Not on file  Social History Narrative   Lives at home with wife, daughter and grandson   Right handed   Caffeine: sodas 2 per day     PHYSICAL EXAM  GENERAL EXAM/CONSTITUTIONAL:  Patient is in no distress; well developed, nourished and groomed; pleasant elderly Caucasian male   NEUROLOGIC: MENTAL STATUS:   awake, alert, oriented to person, place and time  recent and remote memory intact  normal attention and concentration  language fluent, comprehension intact, naming intact,   fund of knowledge appropriate  CRANIAL NERVE:   2nd -UTA  2nd, 3rd, 4th, 6th -  extraocular muscles intact, no nystagmus  5th - facial sensation intact  7th - facial strength symmetric  8th - hearing intact to voice  9th - palate elevates symmetrically, uvula midline  11th - shoulder shrug symmetric  12th - tongue protrusion midline  MOTOR:   No evidence of weakness per direct assessment  SENSORY:   normal and symmetric to light touch  COORDINATION:    finger-nose-finger, fine finger movements normal  REFLEXES:   deep tendon reflexes present and symmetric  GAIT/STATION:   narrow based gait; able to walk tandem    DIAGNOSTIC DATA (LABS, IMAGING, TESTING) - I reviewed patient records, labs, notes, testing and imaging myself where available.  Home sleep test 08/09/2018 STUDY RESULTS: Total Recording Time: 7 h 40 mins; Estimated  Sleep Time: 5 h 35 mins Total Apnea/Hypopnea Index (AHI): 49.0 /h; RDI: 52.6/h; REM AHI: 60.8/h Average Oxygen Saturation:  94%; Lowest Oxygen Saturation:84%  Total Time in Oxygen  Saturation below 89 %: 3.6 minutes  Average Heart Rate: 51 bpm, max 85 bpm.  IMPRESSION: Severe Obstructive Sleep Apnea with mild REM  accentuation, loud snoring, and clinically insignificant oxygen  desaturation.      ASSESSMENT AND PLAN  68 y.o. year old male who is being seen today for follow-up of left thalamic infarct most likely secondary to small vessel disease.  Vascular risk factors include HTN, HLD, tobacco use and alcohol use.  Recent diagnosis of OSA with recommendations of CPAP therapy.  Stable from stroke standpoint with residual mild right thumb numbness.     -Continue aspirin 81 mg daily  and Crestor for secondary stroke prevention -F/u with PCP regarding your HLD and HTN management -Discussion regarding smoking cessation -Appointment with Debbora Presto, NP on 11/22/2018 for CPAP compliance.  Long discussion with patient regarding the importance of use of CPAP especially with findings of severe sleep apnea.  Discussion of increased risk of stroke and heart disease with untreated OSA.  He states he will try the CPAP machine again starting Sunday evening and agreed to contact DME company if he continues have difficulty tolerating.  If he continues to have difficulty tolerating, can consider use of dental device or inspire device -Advised to continue to stay active and maintain a healthy diet -continue to  monitor BP at home -Maintain strict control of hypertension with blood pressure goal below 130/90, diabetes with hemoglobin A1c goal below 6.5% and cholesterol with LDL cholesterol (bad cholesterol) goal below 70 mg/dL. I also advised the patient to eat a healthy diet with plenty of whole grains, cereals, fruits and vegetables, exercise regularly and maintain ideal body weight.  Follow-up as needed is stable from a stroke standpoint  Greater than 50% of time during this 25 minute visit was spent on counseling,explanation of diagnosis of left thalamic infarct, reviewing risk factor management of HTN, HLD, alcohol use and continued tobacco use, planning of further management, discussion with patient and family and coordination of care    Venancio Poisson, AGNP-BC  [facility] [address] [place], [place] [address]  Phone [phone] Fax [phone] Note: This document was prepared with digital dictation and possible smart phrase technology. Any transcriptional errors that result from this process are unintentional.

## 2018-11-09 NOTE — Progress Notes (Signed)
I agree with the above plan 

## 2018-11-22 ENCOUNTER — Other Ambulatory Visit: Payer: Self-pay

## 2018-11-22 ENCOUNTER — Ambulatory Visit: Payer: Medicare Other | Admitting: Family Medicine

## 2018-11-22 NOTE — Telephone Encounter (Signed)
Pt forgot about scheduled appt and is not in office to have it. Requesting a call to r/s

## 2018-11-22 NOTE — Telephone Encounter (Signed)
LMVM for pt to call back and we can reschedule him with Amy/NP anytime available.  Should be able to use same link for this appt.  Ok to schedule when he calls back.

## 2018-12-30 ENCOUNTER — Emergency Department (HOSPITAL_COMMUNITY)
Admission: EM | Admit: 2018-12-30 | Discharge: 2018-12-31 | Disposition: A | Discharge status: Home / Self Care | Payer: Medicare Other | Attending: Emergency Medicine | Admitting: Emergency Medicine

## 2018-12-30 ENCOUNTER — Encounter (HOSPITAL_COMMUNITY): Payer: Self-pay | Admitting: Emergency Medicine

## 2018-12-30 ENCOUNTER — Other Ambulatory Visit: Payer: Self-pay

## 2018-12-30 ENCOUNTER — Emergency Department (HOSPITAL_COMMUNITY): Payer: Medicare Other

## 2018-12-30 DIAGNOSIS — S42292A Other displaced fracture of upper end of left humerus, initial encounter for closed fracture: Secondary | ICD-10-CM | POA: Insufficient documentation

## 2018-12-30 DIAGNOSIS — Y92009 Unspecified place in unspecified non-institutional (private) residence as the place of occurrence of the external cause: Secondary | ICD-10-CM | POA: Diagnosis not present

## 2018-12-30 DIAGNOSIS — Y999 Unspecified external cause status: Secondary | ICD-10-CM | POA: Diagnosis not present

## 2018-12-30 DIAGNOSIS — Z79899 Other long term (current) drug therapy: Secondary | ICD-10-CM | POA: Diagnosis not present

## 2018-12-30 DIAGNOSIS — Z8673 Personal history of transient ischemic attack (TIA), and cerebral infarction without residual deficits: Secondary | ICD-10-CM | POA: Insufficient documentation

## 2018-12-30 DIAGNOSIS — S4992XA Unspecified injury of left shoulder and upper arm, initial encounter: Secondary | ICD-10-CM | POA: Diagnosis present

## 2018-12-30 DIAGNOSIS — Z7982 Long term (current) use of aspirin: Secondary | ICD-10-CM | POA: Diagnosis not present

## 2018-12-30 DIAGNOSIS — F17210 Nicotine dependence, cigarettes, uncomplicated: Secondary | ICD-10-CM | POA: Diagnosis not present

## 2018-12-30 DIAGNOSIS — Y9389 Activity, other specified: Secondary | ICD-10-CM | POA: Diagnosis not present

## 2018-12-30 DIAGNOSIS — X500XXA Overexertion from strenuous movement or load, initial encounter: Secondary | ICD-10-CM | POA: Diagnosis not present

## 2018-12-30 MED ORDER — FENTANYL CITRATE (PF) 100 MCG/2ML IJ SOLN
50.0000 ug | Freq: Once | INTRAMUSCULAR | Status: AC
  Administered 2018-12-30: 50 ug via INTRAVENOUS
  Filled 2018-12-30: qty 2

## 2018-12-30 MED ORDER — OXYCODONE HCL 5 MG PO TABS
5.0000 mg | ORAL_TABLET | Freq: Once | ORAL | Status: AC
  Administered 2018-12-30: 5 mg via ORAL
  Filled 2018-12-30: qty 1

## 2018-12-30 NOTE — ED Provider Notes (Signed)
Patient seen/examined in the Emergency Department in conjunction with Advanced Practice Provider Ge[place] Patient reports left shoulder after feeling a "pop" Exam : awake/alert, no distress, left shoulder tenderness, distal pulses intact Plan: pt with left prox. Humerus fx.  Sling and ortho f/u    Ripley Fraise, MD 12/30/18 2324

## 2018-12-30 NOTE — ED Notes (Signed)
Patient placed on cardiac monitor in triage.

## 2018-12-30 NOTE — Discharge Instructions (Signed)
Please read and follow all provided instructions.  Your diagnoses today include:  1. Other closed displaced fracture of proximal end of left humerus, initial encounter     Tests performed today include:  An x-ray of the affected area - shows broken upper arm (humerus) fracture.   Vital signs. See below for your results today.   Medications prescribed:   Oxycodone - narcotic pain medication  DO NOT drive or perform any activities that require you to be awake and alert because this medicine can make you drowsy.   Use pain medication only under direct supervision at the lowest possible dose needed to control your pain.   Take any prescribed medications only as directed.  Home care instructions:   Follow any educational materials contained in this packet  Follow R.I.C.E. Protocol:  R - rest your injury   I  - use ice on injury without applying directly to skin  C - compress injury with bandage or splint  E - elevate the injury as much as possible  Follow-up instructions: Please follow-up with the provided orthopedic physician next week. Call Monday for an appointment.   Return instructions:   Please return if your fingers are numb or tingling, appear gray or blue, or you have severe pain (also elevate the arm and loosen splint or wrap if you were given one)  Please return to the Emergency Department if you experience worsening symptoms.   Please return if you have any other emergent concerns.  Additional Information:  Your vital signs today were: BP (!) 179/72 (BP Location: Right Arm)    Pulse 61    Temp 97.9 F (36.6 C) (Oral)    Resp 15    SpO2 100%  If your blood pressure (BP) was elevated above 135/85 this visit, please have this repeated by your doctor within one month. --------------

## 2018-12-30 NOTE — ED Triage Notes (Signed)
Per EMS, patient from home, deformity to left shoulder after "feeling pop" while moving a rug.   20g R AC 171mcg Fentanyl

## 2018-12-30 NOTE — ED Provider Notes (Signed)
[facility] DEPT Provider Note   CSN: [id number] Arrival date & time: 12/30/18  2144     History   Chief Complaint Chief Complaint  Patient presents with  . Shoulder Injury    HPI Albert Perez is a 68 y.o. male.     Patient presents to the emergency department by EMS with complaint of acute onset left shoulder and upper arm pain.  Patient states that he was attempting to straighten a rug at home and gave the rug a jerk.  Patient had immediate pain in his left upper arm.  EMS was called.  They administered pain medication with temporary relief.  X-rays in the emergency department demonstrate a left proximal humerus fracture.  Patient denies any numbness or tingling in the arm or hand on the left side.  He denies other injuries.  Patient has a history of melanoma 2 years ago which was excised, lymph nodes negative.  He did not have any chemotherapy or radiation therapy.  The onset of this condition was acute. The course is constant. Aggravating factors: none. Alleviating factors: none.       Past Medical History:  Diagnosis Date  . Stroke ([place])   . Vertigo     Patient Active Problem List   Diagnosis Date Noted  . Severe obstructive sleep apnea-hypopnea syndrome 08/18/2018  . History of atrial fibrillation 07/19/2018  . Snoring 07/19/2018  . Essential hypertension 07/19/2018  . Thalamic stroke ([place]) 07/19/2018    Past Surgical History:  Procedure Laterality Date  . ANKLE FRACTURE SURGERY Right 2003  . COLONOSCOPY  2018  . ELBOW SURGERY Left 1964  . melanoma removal  04/2017   Lt shoulder  . RETINAL DETACHMENT SURGERY Right 1971        Home Medications    Prior to Admission medications   Medication Sig Start Date End Date Taking? Authorizing Provider  aspirin 81 MG tablet Take 1 tablet (81 mg total) by mouth daily. 11/08/17   Penumalli, Earlean Polka, MD  buPROPion (ZYBAN) 150 MG 12 hr tablet Take 1 tablet (150 mg total) by mouth 2 (two)  times daily. Take 150mg  daily for the first 3 days. After 3 days, take 150mg  twice daily. Patient not taking: Reported on 07/19/2018 05/10/18   Venancio Poisson, NP  Cyanocobalamin (B-12 PO) Take 1,000 mg by mouth.    [provider]  Omega-3 1000 MG CAPS Take 2,000 mg by mouth.     [provider]  rosuvastatin (CRESTOR) 5 MG tablet Take 5 mg by mouth daily. 04/26/18   [provider]    Family History Family History  Problem Relation Age of Onset  . Aneurysm Mother   . Colon cancer Brother   . Prostate cancer Brother     Social History Social History   Tobacco Use  . Smoking status: Current Every Day Smoker    Packs/day: 0.50    Types: Cigarettes  . Smokeless tobacco: Never Used  Substance Use Topics  . Alcohol use: Yes    Alcohol/week: 24.0 standard drinks    Types: 24 Cans of beer per week  . Drug use: Not Currently    Comment: marijuana rare     Allergies   Patient has no known allergies.   Review of Systems Review of Systems  Constitutional: Negative for activity change.  Musculoskeletal: Positive for arthralgias. Negative for back pain, gait problem, joint swelling and neck pain.  Skin: Negative for wound.  Neurological: Negative for weakness and  numbness.     Physical Exam Updated Vital Signs BP (!) 155/79 (BP Location: Right Arm)   Pulse (!) 48   Temp 97.9 F (36.6 C) (Oral)   Resp (!) 21   SpO2 96%   Physical Exam Vitals signs and nursing note reviewed.  Constitutional:      Appearance: He is well-developed.  HENT:     Head: Normocephalic and atraumatic.  Eyes:     General:        Right eye: No discharge.        Left eye: No discharge.     Conjunctiva/sclera: Conjunctivae normal.  Neck:     Musculoskeletal: Normal range of motion and neck supple.  Cardiovascular:     Rate and Rhythm: Normal rate and regular rhythm.     Pulses:          Radial pulses are 2+ on the right side and 2+ on the left side.     Heart  sounds: Normal heart sounds.  Pulmonary:     Effort: Pulmonary effort is normal.     Breath sounds: Normal breath sounds.  Abdominal:     Palpations: Abdomen is soft.     Tenderness: There is no abdominal tenderness.  Musculoskeletal:     Left shoulder: He exhibits decreased range of motion and tenderness.     Left elbow: Normal.     Left wrist: Normal.     Left upper arm: He exhibits tenderness. He exhibits no bony tenderness and no swelling.  Skin:    General: Skin is warm and dry.  Neurological:     Mental Status: He is alert.      ED Treatments / Results  Labs (all labs ordered are listed, but only abnormal results are displayed) Labs Reviewed - No data to display  EKG    Radiology Dg Shoulder Left  Result Date: 12/30/2018 CLINICAL DATA:  LEFT shoulder deformity after feeling a pop while moving a rug, injury EXAM: LEFT SHOULDER - 2+ VIEW COMPARISON:  None FINDINGS: Osseous demineralization. AC joint alignment normal. Visualized LEFT ribs intact. Comminuted fracture of the proximal LEFT humeral diaphysis, minimally displaced. No glenohumeral dislocation seen. IMPRESSION: Comminuted minimally displaced oblique fracture of the proximal LEFT humeral diaphysis. Electronically Signed   By: Lavonia Dana M.D.   On: 12/30/2018 23:03   Dg Humerus Left  Result Date: 12/30/2018 CLINICAL DATA:  LEFT shoulder deformity after feeling a pop while moving a rug EXAM: LEFT HUMERUS - 2+ VIEW COMPARISON:  None FINDINGS: Osseous demineralization. Elbow and shoulder joint alignments normal. Comminuted minimally displaced oblique fracture of the proximal LEFT humeral diaphysis. Remainder of humerus intact. Orthopedic screw at proximal ulna. IMPRESSION: Comminuted minimally displaced oblique fracture of the proximal LEFT humeral diaphysis. Electronically Signed   By: Lavonia Dana M.D.   On: 12/30/2018 23:06    Procedures Procedures (including critical care time)  Medications Ordered in ED  Medications  fentaNYL (SUBLIMAZE) injection 50 mcg (50 mcg Intravenous Given 12/30/18 2328)  oxyCODONE (Oxy IR/ROXICODONE) immediate release tablet 5 mg (5 mg Oral Given 12/30/18 2329)     Initial Impression / Assessment and Plan / ED Course  I have reviewed the triage vital signs and the nursing notes.  Pertinent labs & imaging results that were available during my care of the patient were reviewed by me and considered in my medical decision making (see chart for details).        Patient seen and examined.  X-rays personally reviewed and  discussed with patient at bedside.  Patient with significant pain in his arm making transfer from the wheelchair to the bed difficult.  Patient will be given additional pain medication, sling/immobilizer.  Will need orthopedic follow-up and will be given a referral.  Upper extremity is neurovascularly intact.  Patient discussed with and seen by Dr. Christy Gentles.  Vital signs reviewed and are as follows: BP (!) 155/79 (BP Location: Right Arm)   Pulse (!) 48   Temp 97.9 F (36.6 C) (Oral)   Resp (!) 21   SpO2 96%   12:26 AM patient rechecked.  Upper extremity remains neurovascularly intact.  Patient is sitting on the side of the bed.  Pain is controlled.  He states that he is ready for discharge.  He will be prescribed oxycodone for home.  We discussed pain medication precautions.  He will call orthopedic office on Monday.    Use pain medication only under direct supervision at the lowest possible dose needed to control your pain.   Patient counseled on use of narcotic pain medications. Counseled not to combine these medications with others containing tylenol. Urged not to drink alcohol, drive, or perform any other activities that requires focus while taking these medications. The patient verbalizes understanding and agrees with the plan.   Final Clinical Impressions(s) / ED Diagnoses   Final diagnoses:  Other closed displaced fracture of proximal end  of left humerus, initial encounter   Patient with closed left proximal humerus fracture.  No distal vascular or nerve compromise suspected.  Pain controlled in ED.  No head or neck injury suspected.  Patient will need orthopedic follow-up.  ED Discharge Orders         Ordered    oxyCODONE (ROXICODONE) 5 MG immediate release tablet  Every 6 hours PRN     12/31/18 0024           Carlisle Cater, PA-C 12/31/18 Hinda Kehr, MD 12/31/18 0221

## 2018-12-31 MED ORDER — OXYCODONE HCL 5 MG PO TABS: 2.5000 mg | ORAL_TABLET | Freq: Four times a day (QID) | ORAL | 0 refills | Status: DC | PRN

## 2019-01-16 ENCOUNTER — Other Ambulatory Visit (HOSPITAL_COMMUNITY): Payer: Self-pay | Admitting: Family Medicine

## 2019-01-16 ENCOUNTER — Ambulatory Visit (HOSPITAL_COMMUNITY)
Admission: RE | Admit: 2019-01-16 | Discharge: 2019-01-16 | Disposition: A | Discharge status: Home / Self Care | Payer: Medicare Other | Source: Ambulatory Visit | Attending: Family Medicine | Admitting: Family Medicine

## 2019-01-16 ENCOUNTER — Other Ambulatory Visit: Payer: Self-pay

## 2019-01-16 DIAGNOSIS — M79604 Pain in right leg: Secondary | ICD-10-CM

## 2019-01-16 DIAGNOSIS — M7989 Other specified soft tissue disorders: Secondary | ICD-10-CM | POA: Diagnosis not present

## 2019-01-16 NOTE — Progress Notes (Signed)
RLE venous duplex       has been completed. Preliminary results can be found under CV proc through chart review. June Leap, BS, RDMS, RVT  Called results to Amy

## 2019-01-30 ENCOUNTER — Other Ambulatory Visit: Payer: Self-pay

## 2019-01-30 ENCOUNTER — Ambulatory Visit (HOSPITAL_COMMUNITY)
Admission: RE | Admit: 2019-01-30 | Discharge: 2019-01-30 | Disposition: A | Discharge status: Home / Self Care | Payer: Medicare Other | Source: Ambulatory Visit | Attending: Urgent Care | Admitting: Urgent Care

## 2019-01-30 ENCOUNTER — Other Ambulatory Visit (HOSPITAL_COMMUNITY): Payer: Self-pay

## 2019-01-30 DIAGNOSIS — R609 Edema, unspecified: Secondary | ICD-10-CM | POA: Insufficient documentation

## 2019-01-30 NOTE — Progress Notes (Signed)
Left upper extremity venous duplex has been completed. Preliminary results can be found in CV Proc through chart review.  Results were given to Dr. Rip Harbour.  01/30/19 4:47 PM Carlos Levering RVT

## 2019-02-14 ENCOUNTER — Other Ambulatory Visit: Payer: Self-pay | Admitting: Family Medicine

## 2019-02-14 DIAGNOSIS — R9389 Abnormal findings on diagnostic imaging of other specified body structures: Secondary | ICD-10-CM

## 2019-02-21 ENCOUNTER — Other Ambulatory Visit: Payer: Self-pay

## 2019-02-21 ENCOUNTER — Ambulatory Visit
Admission: RE | Admit: 2019-02-21 | Discharge: 2019-02-21 | Disposition: A | Discharge status: Home / Self Care | Payer: Medicare Other | Source: Ambulatory Visit | Attending: Family Medicine | Admitting: Family Medicine

## 2019-02-21 DIAGNOSIS — R9389 Abnormal findings on diagnostic imaging of other specified body structures: Secondary | ICD-10-CM

## 2019-02-21 MED ORDER — IOPAMIDOL (ISOVUE-300) INJECTION 61%
75.0000 mL | Freq: Once | INTRAVENOUS | Status: AC | PRN
  Administered 2019-02-21: 15:00:00 75 mL via INTRAVENOUS

## 2019-03-19 ENCOUNTER — Telehealth: Payer: Self-pay | Admitting: *Deleted

## 2019-03-19 DIAGNOSIS — R918 Other nonspecific abnormal finding of lung field: Secondary | ICD-10-CM

## 2019-03-19 NOTE — Telephone Encounter (Signed)
Oncology Nurse Navigator Documentation  Oncology Nurse Navigator Flowsheets 03/19/2019  Navigator Location [facility]  Referral Date to RadOnc/MedOnc 03/19/2019  Navigator Encounter Type Telephone/I received referral on Mr. Streett today. I called him to schedule him for [place] this week.  He verbalized understanding of appt time and place.   Telephone Outgoing Call  Treatment Phase Abnormal Scans  Barriers/Navigation Needs Coordination of Care;Education  Education Other  Interventions Coordination of Care;Education  Coordination of Care Appts  Education Method Verbal  Acuity Level 1  Time Spent with Patient 30

## 2019-03-20 ENCOUNTER — Encounter: Payer: Self-pay | Admitting: *Deleted

## 2019-03-22 ENCOUNTER — Inpatient Hospital Stay: Payer: Medicare Other | Attending: Internal Medicine

## 2019-03-22 ENCOUNTER — Inpatient Hospital Stay (HOSPITAL_BASED_OUTPATIENT_CLINIC_OR_DEPARTMENT_OTHER): Payer: Medicare Other | Admitting: Internal Medicine

## 2019-03-22 ENCOUNTER — Other Ambulatory Visit: Payer: Self-pay | Admitting: *Deleted

## 2019-03-22 ENCOUNTER — Encounter: Payer: Self-pay | Admitting: Internal Medicine

## 2019-03-22 ENCOUNTER — Other Ambulatory Visit: Payer: Self-pay

## 2019-03-22 ENCOUNTER — Telehealth: Payer: Self-pay | Admitting: Emergency Medicine

## 2019-03-22 VITALS — BP 156/70 | HR 58 | Temp 97.8°F | Resp 17 | Ht 67.5 in | Wt 182.3 lb

## 2019-03-22 DIAGNOSIS — Z8042 Family history of malignant neoplasm of prostate: Secondary | ICD-10-CM | POA: Diagnosis not present

## 2019-03-22 DIAGNOSIS — I10 Essential (primary) hypertension: Secondary | ICD-10-CM

## 2019-03-22 DIAGNOSIS — Z7901 Long term (current) use of anticoagulants: Secondary | ICD-10-CM | POA: Insufficient documentation

## 2019-03-22 DIAGNOSIS — R911 Solitary pulmonary nodule: Secondary | ICD-10-CM

## 2019-03-22 DIAGNOSIS — Z800 Family history of malignant neoplasm of digestive organs: Secondary | ICD-10-CM

## 2019-03-22 DIAGNOSIS — Z85820 Personal history of malignant melanoma of skin: Secondary | ICD-10-CM

## 2019-03-22 DIAGNOSIS — Z79899 Other long term (current) drug therapy: Secondary | ICD-10-CM | POA: Insufficient documentation

## 2019-03-22 DIAGNOSIS — F17210 Nicotine dependence, cigarettes, uncomplicated: Secondary | ICD-10-CM | POA: Diagnosis not present

## 2019-03-22 DIAGNOSIS — Z7982 Long term (current) use of aspirin: Secondary | ICD-10-CM | POA: Insufficient documentation

## 2019-03-22 DIAGNOSIS — R918 Other nonspecific abnormal finding of lung field: Secondary | ICD-10-CM | POA: Insufficient documentation

## 2019-03-22 DIAGNOSIS — Z8673 Personal history of transient ischemic attack (TIA), and cerebral infarction without residual deficits: Secondary | ICD-10-CM | POA: Diagnosis not present

## 2019-03-22 LAB — CMP (CANCER CENTER ONLY)
ALT: 23 U/L (ref 0–44)
AST: 20 U/L (ref 15–41)
Albumin: 4.1 g/dL (ref 3.5–5.0)
Alkaline Phosphatase: 98 U/L (ref 38–126)
Anion gap: 9 (ref 5–15)
BUN: 16 mg/dL (ref 8–23)
CO2: 24 mmol/L (ref 22–32)
Calcium: 9.1 mg/dL (ref 8.9–10.3)
Chloride: 106 mmol/L (ref 98–111)
Creatinine: 1.11 mg/dL (ref 0.61–1.24)
GFR, Est AFR Am: >60 mL/min (ref >60)
GFR, Estimated: >60 mL/min (ref >60)
Glucose, Bld: 103 mg/dL — ABNORMAL HIGH (ref 70–99)
Potassium: 4.1 mmol/L (ref 3.5–5.1)
Sodium: 139 mmol/L (ref 135–145)
Total Bilirubin: 0.7 mg/dL (ref 0.3–1.2)
Total Protein: 6.8 g/dL (ref 6.5–8.1)

## 2019-03-22 LAB — CBC WITH DIFFERENTIAL (CANCER CENTER ONLY)
Abs Immature Granulocytes: 0.04 10*3/uL (ref 0.00–0.07)
Basophils Absolute: 0 10*3/uL (ref 0.0–0.1)
Basophils Relative: 1 %
Eosinophils Absolute: 0.2 10*3/uL (ref 0.0–0.5)
Eosinophils Relative: 3 %
HCT: 40.7 % (ref 39.0–52.0)
Hemoglobin: 13.7 g/dL (ref 13.0–17.0)
Immature Granulocytes: 1 %
Lymphocytes Relative: 28 %
Lymphs Abs: 1.8 10*3/uL (ref 0.7–4.0)
MCH: 32.3 pg (ref 26.0–34.0)
MCHC: 33.7 g/dL (ref 30.0–36.0)
MCV: 96 fL (ref 80.0–100.0)
Monocytes Absolute: 0.6 10*3/uL (ref 0.1–1.0)
Monocytes Relative: 10 %
Neutro Abs: 3.8 10*3/uL (ref 1.7–7.7)
Neutrophils Relative %: 57 %
Platelet Count: 262 10*3/uL (ref 150–400)
RBC: 4.24 MIL/uL (ref 4.22–5.81)
RDW: 14.3 % (ref 11.5–15.5)
WBC Count: 6.4 10*3/uL (ref 4.0–10.5)
nRBC: 0 % (ref 0.0–0.2)

## 2019-03-22 NOTE — Telephone Encounter (Signed)
Also he is on xarelto for A fib Would need to stop 2 days prior.

## 2019-03-22 NOTE — Progress Notes (Signed)
The proposed treatment plan discussed at cancer conference on 03/22/19 is for discussion purpose only and is not a binding recommendation.  The patient was not examined nor present for their treatment options.

## 2019-03-22 NOTE — Progress Notes (Signed)
[place] [phone]   Fax:([phone] Multidisciplinary thoracic oncology clinic  CONSULT NOTE  REFERRING PHYSICIAN: Dr. Orpah Melter  REASON FOR CONSULTATION:  68 years old white male with suspicious lung cancer.  HPI Albert Perez is a 68 y.o. male with past medical history significant for vertigo and stroke and currently on Xarelto.  The patient presented to his primary care physician complaining of cough with hemoptysis.  Chest x-ray was performed initially in their office and it showed suspicious lesion in the left lung.  This was followed by CT scan of the chest with contrast on 02/21/2019 and that showed a masslike consolidation of the superior segment left lower lobe measuring approximately 3.5 x 2.2 x 4.0 cm with some degree of internal cavitation and a large hilum of adjacent groundglass measuring at least 7.0 cm.  The scan showed no enlarged mediastinal, hilar or axillary lymph nodes.  There are prominent but not pathologically enlarged AP window lymph nodes measuring up to 0.9 cm.  These findings are highly concerning for primary lung malignancy. The patient was referred to the multidisciplinary thoracic oncology clinic today for further evaluation and recommendation regarding his condition. When seen today the patient is feeling fine except for the persistent mild cough with blood-tinged sputum but no significant chest pain, shortness breath or frank hemoptysis.  He intentionally lost around 10 pounds in the last 12 months.  He has no nausea, vomiting, diarrhea or constipation.  He denied having any headache or visual changes. Family history significant for mother died from aneurysm, brother had colon and prostate cancer and sister had cancer of unknown to him. The patient is married and has a son who is 67 and daughter 31.  The patient used to work in [employer].  He has a history of smoking for a total of 35 years and quit last months.  He also drinks  2-3 alcoholic drinks every day.  He smoked marijuana occasionally.  HPI  Past Medical History:  Diagnosis Date  . Stroke ([place])   . Vertigo     Past Surgical History:  Procedure Laterality Date  . ANKLE FRACTURE SURGERY Right 2003  . COLONOSCOPY  2018  . ELBOW SURGERY Left 1964  . melanoma removal  04/2017   Lt shoulder  . RETINAL DETACHMENT SURGERY Right 1971    Family History  Problem Relation Age of Onset  . Aneurysm Mother   . Colon cancer Brother   . Prostate cancer Brother     Social History Social History   Tobacco Use  . Smoking status: Current Every Day Smoker    Packs/day: 0.50    Types: Cigarettes  . Smokeless tobacco: Never Used  Substance Use Topics  . Alcohol use: Yes    Alcohol/week: 24.0 standard drinks    Types: 24 Cans of beer per week  . Drug use: Not Currently    Comment: marijuana rare    No Known Allergies  Current Outpatient Medications  Medication Sig Dispense Refill  . aspirin 81 MG tablet Take 1 tablet (81 mg total) by mouth daily.    Marland Kitchen buPROPion (ZYBAN) 150 MG 12 hr tablet Take 1 tablet (150 mg total) by mouth 2 (two) times daily. Take 150mg  daily for the first 3 days. After 3 days, take 150mg  twice daily. (Patient not taking: Reported on 07/19/2018) 60 tablet 2  . Cyanocobalamin (B-12 PO) Take 1,000 mg by mouth.    . Omega-3 1000 MG CAPS Take 2,000 mg  by mouth.     . oxyCODONE (ROXICODONE) 5 MG immediate release tablet Take 0.5-1 tablets (2.5-5 mg total) by mouth every 6 (six) hours as needed for severe pain. 10 tablet 0  . rosuvastatin (CRESTOR) 5 MG tablet Take 5 mg by mouth daily.  2   No current facility-administered medications for this visit.     Review of Systems  Constitutional: positive for weight loss Eyes: negative Ears, nose, mouth, throat, and face: negative Respiratory: positive for cough and sputum Cardiovascular: negative Gastrointestinal: negative Genitourinary:negative Integument/breast: negative  Hematologic/lymphatic: negative Musculoskeletal:negative Neurological: negative Behavioral/Psych: negative Endocrine: negative Allergic/Immunologic: negative  Physical Exam  DTO:IZTIW, healthy, no distress, well nourished and well developed SKIN: skin color, texture, turgor are normal, no rashes or significant lesions HEAD: Normocephalic, No masses, lesions, tenderness or abnormalities EYES: normal, PERRLA, Conjunctiva are pink and non-injected EARS: External ears normal, Canals clear OROPHARYNX:no exudate, no erythema and lips, buccal mucosa, and tongue normal  NECK: supple, no adenopathy, no JVD LYMPH:  no palpable lymphadenopathy, no hepatosplenomegaly LUNGS: clear to auscultation , and palpation HEART: regular rate & rhythm, no murmurs and no gallops ABDOMEN:abdomen soft, non-tender, normal bowel sounds and no masses or organomegaly BACK: Back symmetric, no curvature., No CVA tenderness EXTREMITIES:no joint deformities, effusion, or inflammation, no edema  NEURO: alert & oriented x 3 with fluent speech, no focal motor/sensory deficits  PERFORMANCE STATUS: ECOG 1  LABORATORY DATA: Lab Results  Component Value Date   WBC 6.4 03/22/2019   HGB 13.7 03/22/2019   HCT 40.7 03/22/2019   MCV 96.0 03/22/2019   PLT 262 03/22/2019      Chemistry   No results found for: NA, K, CL, CO2, BUN, CREATININE, GLU No results found for: CALCIUM, ALKPHOS, AST, ALT, BILITOT     RADIOGRAPHIC STUDIES: Ct Chest W Contrast  Result Date: 02/21/2019 CLINICAL DATA:  Abnormal chest x-ray, possible malignancy EXAM: CT CHEST WITH CONTRAST TECHNIQUE: Multidetector CT imaging of the chest was performed during intravenous contrast administration. CONTRAST:  63mL ISOVUE-300 IOPAMIDOL (ISOVUE-300) INJECTION 61% COMPARISON:  Chest radiograph, 02/09/2019, MR left shoulder, 02/16/2019, left humeral radiographs, 01/29/2019 FINDINGS: Cardiovascular: Aortic atherosclerosis. Normal heart size. Scattered  three-vessel coronary artery calcifications. No pericardial effusion. Mediastinum/Nodes: No enlarged mediastinal, hilar, or axillary lymph nodes. There are prominent although not pathologically enlarged AP window lymph nodes measuring up to 9 mm (series 2, image 62). Thyroid gland, trachea, and esophagus demonstrate no significant findings. Lungs/Pleura: There is a masslike consolidation of the superior segment left lower lobe measuring approximately 3.5 x 2.2 x 4.0 cm with some degree of internal cavitation and a large halo of adjacent ground-glass measuring at least 7.0 cm (series 3, image 75, series 5, image 115). There is fine centrilobular nodularity in the upper lobes consistent with smoking-related respiratory bronchiolitis. No pleural effusion or pneumothorax. Upper Abdomen: No acute abnormality. Musculoskeletal: Disc degenerative disease and osteophytosis of the midthoracic spine, with sclerosis of the T8 vertebral body and a lucent superior endplate deformity. Incompletely callused fracture of the mid left humerus, previously characterized by MRI and radiographs. IMPRESSION: 1. There is a masslike consolidation of the superior segment left lower lobe measuring approximately 3.5 x 2.2 x 4.0 cm with some degree of internal cavitation and a large halo of adjacent ground-glass measuring at least 7.0 cm (series 3, image 75, series 5, image 115). There are prominent although not pathologically enlarged AP window lymph nodes measuring up to 9 mm (series 2, image 62). Findings are highly concerning for primary lung  malignancy. Consider PET/CT to characterize for metabolic activity and percutaneous CT-guided biopsy. 2. Disc degenerative disease and osteophytosis of the midthoracic spine, with sclerosis of the T8 vertebral body and a lucent superior endplate deformity. This likely reflects reactive endplate change and an unusually large Schmorl type lesion of the endplate although osseous metastatic disease is not  excluded. This may be further characterized by contrast enhanced MRI and PET-CT. 3.  Coronary artery disease and aortic atherosclerosis. Electronically Signed   By: Darrel Candle M.D.   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  The patient also has a history of early stage malignant melanoma removed from the left shoulder in October 2018 at [facility] and possibility of metastatic disease in the lung cannot be excluded.   PLAN: I had a lengthy discussion with the patient today about his current condition and further investigation to confirm his diagnosis. I personally and independently reviewed the scan images and discussed the result and showed the images to the patient today. I recommended for him to have a PET scan for further evaluation of this lesion and to rule out any other metastatic disease. I also referred the patient to Dr. Valeta Harms for consideration of bronchoscopy for tissue diagnosis and also for pulmonary function test. I will arrange for the patient to come back to the multidisciplinary thoracic oncology clinic in 2 weeks for discussion of his PET scan as well as the biopsy results and also surgical evaluation if no evidence of metastatic disease. The patient is in agreement with the current plan. He was advised to call immediately if he has any concerning symptoms in the interval. The patient voices understanding of current disease status and treatment options and is in agreement with the current care plan.  All questions were answered. The patient knows to call the clinic with any problems, questions or concerns. We can certainly see the patient much sooner if necessary.  Thank you so much for allowing me to participate in the care of [place]. I will continue to follow up the patient with you and assist in his care.  I spent 40 minutes  counseling the patient face to face. The total time spent in the appointment was 60 minutes.  Disclaimer: This note was dictated with voice recognition software. Similar sounding words can inadvertently be transcribed and may not be corrected upon review.   Eilleen Kempf March 22, 2019, 2:02 PM

## 2019-03-22 NOTE — Telephone Encounter (Signed)
Need to try to set him up with OV w me, preferably on Tuesday 9/22, so that we can potentially arrange for ENB on the am of 9/23.   I think best plan is to speak to patient, tell him I'll meet him on 9/22, go ahead and ask LO to schedule him for 9/23 morning ENB.   The other issues:  - he will need a COVID test - need to make his CT chest from 02/21/2019 into a superD, done at [place].   Thanks for your help.

## 2019-03-22 NOTE — Telephone Encounter (Signed)
LMTCB x1 for pt. I have left a message with [facility] to convert the CT from 02/21/2019.

## 2019-03-23 ENCOUNTER — Encounter: Payer: Self-pay | Admitting: Internal Medicine

## 2019-03-23 ENCOUNTER — Other Ambulatory Visit: Payer: Self-pay | Admitting: Emergency Medicine

## 2019-03-23 NOTE — Telephone Encounter (Signed)
Called and spoke to patient.  Scheduled pre-procedure COVID testing.

## 2019-03-23 NOTE — Telephone Encounter (Signed)
Spoke with pt. He has been scheduled to see RB on 03/27/2019 at 1100. I have contacted [facility] again today. They are going to get this taken care of and will be available pick at the [address] location. RB is aware of this.  Will route message to Burman Nieves to arrange COVID testing.

## 2019-03-24 ENCOUNTER — Other Ambulatory Visit (HOSPITAL_COMMUNITY)
Admission: RE | Admit: 2019-03-24 | Discharge: 2019-03-24 | Disposition: A | Discharge status: Home / Self Care | Payer: Medicare Other | Source: Ambulatory Visit | Attending: Emergency Medicine | Admitting: Emergency Medicine

## 2019-03-24 DIAGNOSIS — Z20828 Contact with and (suspected) exposure to other viral communicable diseases: Secondary | ICD-10-CM | POA: Diagnosis not present

## 2019-03-24 DIAGNOSIS — Z01812 Encounter for preprocedural laboratory examination: Secondary | ICD-10-CM | POA: Diagnosis present

## 2019-03-25 LAB — NOVEL CORONAVIRUS, NAA (HOSP ORDER, SEND-OUT TO REF LAB; TAT 18-24 HRS): SARS-CoV-2, NAA: NOT DETECTED

## 2019-03-26 ENCOUNTER — Encounter: Payer: Self-pay | Admitting: *Deleted

## 2019-03-26 NOTE — Progress Notes (Signed)
Oncology Nurse Navigator Documentation  Oncology Nurse Navigator Flowsheets 03/26/2019  Diagnosis Status Additional Work Up  Navigator Follow Up Date: 03/28/2019  Navigator Follow Up Reason: Radiology  Navigator Location [facility]  Referral Date to RadOnc/MedOnc -  Navigator Encounter Type Other/I followed up on Albert Perez PET scan.  This scan is not authed yet.  I contacted autho coordinator to expedite this process so it can get scheduled.   Telephone -  Abnormal Finding Date 02/21/2019  Treatment Phase Abnormal Scans  Barriers/Navigation Needs Coordination of Care  Education -  Interventions Coordination of Care  Coordination of Care Other  Education Method -  Acuity Level 2-Minimal Needs (1-2 Barriers Identified)  Time Spent with Patient 30

## 2019-03-27 ENCOUNTER — Encounter (HOSPITAL_COMMUNITY): Payer: Self-pay | Admitting: Vascular Surgery

## 2019-03-27 ENCOUNTER — Other Ambulatory Visit: Payer: Self-pay

## 2019-03-27 ENCOUNTER — Ambulatory Visit: Payer: Medicare Other | Admitting: Emergency Medicine

## 2019-03-27 ENCOUNTER — Encounter: Payer: Self-pay | Admitting: Emergency Medicine

## 2019-03-27 ENCOUNTER — Telehealth: Payer: Self-pay | Admitting: Emergency Medicine

## 2019-03-27 VITALS — BP 156/98 | HR 68 | Ht 67.5 in | Wt 181.0 lb

## 2019-03-27 DIAGNOSIS — Z720 Tobacco use: Secondary | ICD-10-CM

## 2019-03-27 DIAGNOSIS — R918 Other nonspecific abnormal finding of lung field: Secondary | ICD-10-CM

## 2019-03-27 NOTE — Telephone Encounter (Signed)
Orders entered

## 2019-03-27 NOTE — Progress Notes (Signed)
Pt denies SOB, chest pain, and being under the care of a cardiologist. Pt denies having a cardiac cath but stated that a stress test was performed " in the 80's. Pt denies having an EKG and chest x ray in the last year. Pt stated that he had A-fib " around 2000-2001. Pt stated that last dose of Xarelto was 03/22/19 as instructed by MD.  Pt made aware to stop taking vitamins, fish oil and herbal medications. Do not take any NSAIDs ie: Ibuprofen, Advil, Naproxen (Aleve), Motrin, BC and Goody Powder. Pt verbalized understanding of all pre-op instructions. PA, Anesthesiology, asked to review pt history.

## 2019-03-27 NOTE — Progress Notes (Signed)
Subjective:    Patient ID: Albert Perez, male    DOB: 12-27-1950, 68 y.o.   MRN: [id number]  HPI 68 year old former smoker (10 pack years, recently stopped), with a history of CVA. He was started on xarelto in July after he developed a DVT in his LUE.  He was evaluated for cough and hemoptysis in the last month, underwent CT chest 02/21/2019 which I reviewed and which shows left lower lobe opacity, mixed groundglass and solid with an area of apparent cavitation.  He denies any significant dyspnea or chest pain.  His hemoptysis has improved since he held his Xarelto.  He is here today for further evaluation of the left lower lobe lesion.   Review of Systems  Constitutional: Negative for fever and unexpected weight change.  HENT: Negative for congestion, dental problem, ear pain, nosebleeds, postnasal drip, rhinorrhea, sinus pressure, sneezing, sore throat and trouble swallowing.   Eyes: Negative for redness and itching.  Respiratory: Negative for cough, chest tightness, shortness of breath and wheezing.   Cardiovascular: Negative for palpitations and leg swelling.  Gastrointestinal: Negative for nausea and vomiting.  Genitourinary: Negative for dysuria.  Musculoskeletal: Negative for joint swelling.  Skin: Negative for rash.  Neurological: Negative for headaches.  Hematological: Does not bruise/bleed easily.  Psychiatric/Behavioral: Negative for dysphoric mood. The patient is not nervous/anxious.     Past Medical History:  Diagnosis Date  . A-fib [facility])    PMH: 2000-2001  . Arthritis   . Cancer ([place])    melanoma 2018  . DVT (deep venous thrombosis) ([facility])    Left brachial vein DVT (01/30/19, following fracture)  . ETOH abuse   . GERD (gastroesophageal reflux disease)   . Sleep apnea    do not wear CPAP  . Stroke [facility])    " mini"  . Vertigo   . Wears glasses      Family History  Problem Relation Age of Onset  . Aneurysm Mother   . Colon cancer Brother   . Prostate cancer  Brother      Social History   Socioeconomic History  . Marital status: Married    Spouse name: Not on file  . Number of children: 2  . Years of education: 71  . Highest education level: Some college, no degree  Occupational History  . Not on file  Social Needs  . Financial resource strain: Not on file  . Food insecurity    Worry: Not on file    Inability: Not on file  . Transportation needs    Medical: Not on file    Non-medical: Not on file  Tobacco Use  . Smoking status: Former Smoker    Packs/day: 0.50    Years: 20.00    Pack years: 10.00    Types: Cigarettes    Quit date: 03/06/2019    Years since quitting: 0.0  . Smokeless tobacco: Never Used  Substance and Sexual Activity  . Alcohol use: Yes    Alcohol/week: 24.0 standard drinks    Types: 24 Cans of beer per week    Comment: 3 -4 drinks of Vodka daily  . Drug use: Yes    Types: Marijuana    Comment: CBD oil  . Sexual activity: Not on file  Lifestyle  . Physical activity    Days per week: Not on file    Minutes per session: Not on file  . Stress: Not on file  Relationships  . Social Herbalist on phone:  Not on file    Gets together: Not on file    Attends religious service: Not on file    Active member of club or organization: Not on file    Attends meetings of clubs or organizations: Not on file    Relationship status: Not on file  . Intimate partner violence    Fear of current or ex partner: Not on file    Emotionally abused: Not on file    Physically abused: Not on file    Forced sexual activity: Not on file  Other Topics Concern  . Not on file  Social History Narrative   Lives at home with wife, daughter and grandson   Right handed   Caffeine: sodas 2 per day     No Known Allergies   Outpatient Medications Prior to Visit  Medication Sig Dispense Refill  . acetaminophen (TYLENOL) 650 MG CR tablet Take 1,300 mg by mouth every 8 (eight) hours as needed for pain.    Marland Kitchen aspirin 81 MG  tablet Take 1 tablet (81 mg total) by mouth daily.    . Cyanocobalamin (B-12 PO) Take 1,000 mg by mouth daily.     . Omega-3 Fatty Acids (OMEGA-3 PO) Take 1,200 mg by mouth daily.     . rivaroxaban (XARELTO) 20 MG TABS tablet Take 20 mg by mouth daily with supper.    . rosuvastatin (CRESTOR) 5 MG tablet Take 5 mg by mouth daily.  2   No facility-administered medications prior to visit.         Objective:   Physical Exam Vitals:   03/27/19 1123  BP: (!) 156/98  Pulse: 68  SpO2: 100%  Weight: 181 lb (82.1 kg)  Height: 5' 7.5" (1.715 m)   Gen: Pleasant, well-nourished, in no distress,  normal affect  ENT: No lesions,  mouth clear,  oropharynx clear, no postnasal drip  Neck: No JVD, no stridor  Lungs: No use of accessory muscles, no crackles or wheezing on normal respiration, no wheeze on forced expiration  Cardiovascular: RRR, heart sounds normal, no murmur or gallops, no peripheral edema  Musculoskeletal: No deformities, no cyanosis or clubbing  Neuro: alert, awake, non focal  Skin: Warm, no lesions or rash     Assessment & Plan:  Mass of lower lobe of left lung In a patient with a minimal tobacco history.  The mass has both solid and groundglass components, may have some area of cavitation.  Suspicious for primary malignancy.  He needs a tissue diagnosis.  Based on the cytology, PFT, PET scan to look for any distant disease, he may end up being a surgical candidate for IIb disease.  He is set up for navigational bronchoscopy on 9/23.  Keep Xarelto on hold.   Baltazar Apo, MD, PhD 03/27/2019, 2:58 PM [facility] [phone] or if no answer [phone]

## 2019-03-27 NOTE — Patient Instructions (Signed)
Do not restart your Xarelto at this time We are planning for navigational bronchoscopy on 9/23.  Do not eat after midnight.  Arrive at outpatient admitting tomorrow as instructed We will arrange for full pulmonary function testing in the near future to assess overall lung function. Follow with Dr Lamonte Sakai in 1 month

## 2019-03-27 NOTE — Anesthesia Preprocedure Evaluation (Addendum)
Anesthesia Evaluation  Patient identified by MRN, date of birth, ID band Patient awake    Reviewed: Allergy & Precautions, NPO status , Patient's Chart, lab work & pertinent test results  Airway Mallampati: II  TM Distance: >3 FB Neck ROM: Full    Dental  (+) Dental Advisory Given   Pulmonary sleep apnea , former smoker,    Pulmonary exam normal breath sounds clear to auscultation       Cardiovascular hypertension, Normal cardiovascular exam+ dysrhythmias Atrial Fibrillation  Rhythm:Regular Rate:Normal     Neuro/Psych PSYCHIATRIC DISORDERS TIA   GI/Hepatic Neg liver ROS, GERD  ,  Endo/Other  negative endocrine ROS  Renal/GU negative Renal ROS     Musculoskeletal  (+) Arthritis ,   Abdominal   Peds negative pediatric ROS (+)  Hematology negative hematology ROS (+)   Anesthesia Other Findings   Reproductive/Obstetrics negative OB ROS                           Anesthesia Physical Anesthesia Plan  ASA: III  Anesthesia Plan: General   Post-op Pain Management:    Induction: Intravenous  PONV Risk Score and Plan: 2 and Ondansetron, Dexamethasone and Treatment may vary due to age or medical condition  Airway Management Planned: Oral ETT  Additional Equipment: None  Intra-op Plan:   Post-operative Plan: Extubation in OR  Informed Consent: I have reviewed the patients History and Physical, chart, labs and discussed the procedure including the risks, benefits and alternatives for the proposed anesthesia with the patient or authorized representative who has indicated his/her understanding and acceptance.     Dental advisory given  Plan Discussed with: CRNA  Anesthesia Plan Comments: (See PAT note written 03/27/2019 by Myra Gianotti, PA-C. SAME DAY WORK-UP. History melanoma. Recent former smoker. + ETOH 3-4/day. Remote afib ~ 2000 (SR in 2003)--no recent EKG, so EKG on arrival day of  surgery. LUE DVT 01/30/19 following injury.    )       Anesthesia Quick Evaluation

## 2019-03-27 NOTE — H&P (View-Only) (Signed)
Subjective:    Patient ID: Albert Perez, male    DOB: 1951/03/25, 68 y.o.   MRN: [id number]  HPI 68 year old former smoker (10 pack years, recently stopped), with a history of CVA. He was started on xarelto in July after he developed a DVT in his LUE.  He was evaluated for cough and hemoptysis in the last month, underwent CT chest 02/21/2019 which I reviewed and which shows left lower lobe opacity, mixed groundglass and solid with an area of apparent cavitation.  He denies any significant dyspnea or chest pain.  His hemoptysis has improved since he held his Xarelto.  He is here today for further evaluation of the left lower lobe lesion.   Review of Systems  Constitutional: Negative for fever and unexpected weight change.  HENT: Negative for congestion, dental problem, ear pain, nosebleeds, postnasal drip, rhinorrhea, sinus pressure, sneezing, sore throat and trouble swallowing.   Eyes: Negative for redness and itching.  Respiratory: Negative for cough, chest tightness, shortness of breath and wheezing.   Cardiovascular: Negative for palpitations and leg swelling.  Gastrointestinal: Negative for nausea and vomiting.  Genitourinary: Negative for dysuria.  Musculoskeletal: Negative for joint swelling.  Skin: Negative for rash.  Neurological: Negative for headaches.  Hematological: Does not bruise/bleed easily.  Psychiatric/Behavioral: Negative for dysphoric mood. The patient is not nervous/anxious.     Past Medical History:  Diagnosis Date  . A-fib [facility])    PMH: 2000-2001  . Arthritis   . Cancer ([place])    melanoma 2018  . DVT (deep venous thrombosis) (HCC)    Left brachial vein DVT (01/30/19, following fracture)  . ETOH abuse   . GERD (gastroesophageal reflux disease)   . Sleep apnea    do not wear CPAP  . Stroke [facility])    " mini"  . Vertigo   . Wears glasses      Family History  Problem Relation Age of Onset  . Aneurysm Mother   . Colon cancer Brother   . Prostate cancer  Brother      Social History   Socioeconomic History  . Marital status: Married    Spouse name: Not on file  . Number of children: 2  . Years of education: 56  . Highest education level: Some college, no degree  Occupational History  . Not on file  Social Needs  . Financial resource strain: Not on file  . Food insecurity    Worry: Not on file    Inability: Not on file  . Transportation needs    Medical: Not on file    Non-medical: Not on file  Tobacco Use  . Smoking status: Former Smoker    Packs/day: 0.50    Years: 20.00    Pack years: 10.00    Types: Cigarettes    Quit date: 03/06/2019    Years since quitting: 0.0  . Smokeless tobacco: Never Used  Substance and Sexual Activity  . Alcohol use: Yes    Alcohol/week: 24.0 standard drinks    Types: 24 Cans of beer per week    Comment: 3 -4 drinks of Vodka daily  . Drug use: Yes    Types: Marijuana    Comment: CBD oil  . Sexual activity: Not on file  Lifestyle  . Physical activity    Days per week: Not on file    Minutes per session: Not on file  . Stress: Not on file  Relationships  . Social Herbalist on phone:  Not on file    Gets together: Not on file    Attends religious service: Not on file    Active member of club or organization: Not on file    Attends meetings of clubs or organizations: Not on file    Relationship status: Not on file  . Intimate partner violence    Fear of current or ex partner: Not on file    Emotionally abused: Not on file    Physically abused: Not on file    Forced sexual activity: Not on file  Other Topics Concern  . Not on file  Social History Narrative   Lives at home with wife, daughter and grandson   Right handed   Caffeine: sodas 2 per day     No Known Allergies   Outpatient Medications Prior to Visit  Medication Sig Dispense Refill  . acetaminophen (TYLENOL) 650 MG CR tablet Take 1,300 mg by mouth every 8 (eight) hours as needed for pain.    Marland Kitchen aspirin 81 MG  tablet Take 1 tablet (81 mg total) by mouth daily.    . Cyanocobalamin (B-12 PO) Take 1,000 mg by mouth daily.     . Omega-3 Fatty Acids (OMEGA-3 PO) Take 1,200 mg by mouth daily.     . rivaroxaban (XARELTO) 20 MG TABS tablet Take 20 mg by mouth daily with supper.    . rosuvastatin (CRESTOR) 5 MG tablet Take 5 mg by mouth daily.  2   No facility-administered medications prior to visit.         Objective:   Physical Exam Vitals:   03/27/19 1123  BP: (!) 156/98  Pulse: 68  SpO2: 100%  Weight: 181 lb (82.1 kg)  Height: 5' 7.5" (1.715 m)   Gen: Pleasant, well-nourished, in no distress,  normal affect  ENT: No lesions,  mouth clear,  oropharynx clear, no postnasal drip  Neck: No JVD, no stridor  Lungs: No use of accessory muscles, no crackles or wheezing on normal respiration, no wheeze on forced expiration  Cardiovascular: RRR, heart sounds normal, no murmur or gallops, no peripheral edema  Musculoskeletal: No deformities, no cyanosis or clubbing  Neuro: alert, awake, non focal  Skin: Warm, no lesions or rash     Assessment & Plan:  Mass of lower lobe of left lung In a patient with a minimal tobacco history.  The mass has both solid and groundglass components, may have some area of cavitation.  Suspicious for primary malignancy.  He needs a tissue diagnosis.  Based on the cytology, PFT, PET scan to look for any distant disease, he may end up being a surgical candidate for IIb disease.  He is set up for navigational bronchoscopy on 9/23.  Keep Xarelto on hold.   Baltazar Apo, MD, PhD 03/27/2019, 2:58 PM [facility] [phone] or if no answer [phone]

## 2019-03-27 NOTE — Progress Notes (Addendum)
Anesthesia Chart Review:  Case: [id number] Date/Time: 03/28/19 1015   Procedure: VIDEO BRONCHOSCOPY WITH ENDOBRONCHIAL NAVIGATION (N/A )   Anesthesia type: General   Pre-op diagnosis: LUNG MASS   Location: [facility] OR ROOM 10 / [place] [facility]   Surgeon: Collene Gobble, MD      DISCUSSION: Patient is a 68 year old male scheduled for the above procedure. He recently developed hemoptysis prompting a chest CT which showed a left lower lobe mass suspicious for lung carcinoma; however, also with history of melanoma. Oncology referred for above procedure in hopes to get definitive tissue diagnosis. He was just seen by Dr. Lamonte Sakai on 03/27/19--note is not yet completed, but it looks like he advised Xarelto to be held for 2 days (although patient reported last dose 03/22/19).  History includes former smoker (quit 03/06/19), afib (~ 2000/2001; no known recent afib), LUE DVT (01/30/19), CVA (right hand/mouth numbness; 09/02/17 MRI: chronic lacunar infarcts vs dilated perivascular spaces in the right caudate body and left thalamus), vertigo, OSA (intolerant to CPAP), melanoma (s/p wide local excision, left back/shoulder 04/04/17), ETOH abuse (reported 24 alcoholic beverages per week: 3-4 vodka + Gatorade daily).    He will need an EKG on the day of surgery (last tracing 08/22/01 in [place] showed NSR; no EKG at his PCP office [facility]). He had labs on 03/22/19. He is on Xarelto, so he would meet anesthesia criteria for day of surgery PT/INR--unless deferred by his assigned anesthesiologist. 03/24/19 COVID-19 test negative. I updated anesthesiologist Oleta Mouse, MD of history. Anesthesia team to evaluate on the day of surgery.    PROVIDERSAura Dials, MD is listed as PCP. 02/09/19 office notes from his Orpah Melter, MD and referral to [facility] scanned under Media Tab. Since DVT was provoked, three month of Xarelto planned. Curt Bears, MD is HEM-ONC - Neurologist is with [employer]. Initially  seen by Andrey Spearman, MD for stroke-like symptoms. Also saw Dohmeier, Asencion Partridge, MD for sleep evaluation. Last visit with Claris Gower, NP on 11/09/18. She was stable from a CVA standpoint, but he was not tolerating CPAP at that time.    LABS: As of 03/22/19, labs showed: Lab Results  Component Value Date   WBC 6.4 03/22/2019   HGB 13.7 03/22/2019   HCT 40.7 03/22/2019   PLT 262 03/22/2019   GLUCOSE 103 (H) 03/22/2019   ALT 23 03/22/2019   AST 20 03/22/2019   NA 139 03/22/2019   K 4.1 03/22/2019   CL 106 03/22/2019   CREATININE 1.11 03/22/2019   BUN 16 03/22/2019   CO2 24 03/22/2019      OTHER:  Home Sleep Test 08/09/18: IMPRESSION: Severe Obstructive Sleep Apnea ( AHI 49/h) with mild REM  accentuation, loud snoring, and clinically insignificant oxygen  desaturation.  RECOMMENDATION: Treatment options include CPAP, dental device and  Inspire device.  Treatment of first choice is autotitration CPAP therapy, 5-16 cm  water, 2 cm EPR and mask of patient's choice and comfort, heated  humidity.  If CPAP is not tolerated may change to the alternative treatments named  above. Please educate about need for smoking cessation.    IMAGES: CT Chest 02/21/19: IMPRESSION: 1. There is a masslike consolidation of the superior segment left lower lobe measuring approximately 3.5 x 2.2 x 4.0 cm with some degree of internal cavitation and a large halo of adjacent ground-glass measuring at least 7.0 cm (series 3, image 75, series 5, image 115). There are prominent although not pathologically enlarged AP window lymph nodes  measuring up to 9 mm (series 2, image 62). Findings are highly concerning for primary lung malignancy. Consider PET/CT to characterize for metabolic activity and percutaneous CT-guided biopsy. 2. Disc degenerative disease and osteophytosis of the midthoracic spine, with sclerosis of the T8 vertebral body and a lucent superior endplate deformity. This likely reflects  reactive endplate change and an unusually large Schmorl type lesion of the endplate although osseous metastatic disease is not excluded. This may be further characterized by contrast enhanced MRI and PET-CT. 3.  Coronary artery disease and aortic atherosclerosis.   EKG: Needs day of surgery.    CV: LUE Venous US 01/30/19: Left: Findings consistent with acute deep vein thrombosis involving the left brachial veins. Findings consistent with acute superficial vein thrombosis involving the left basilic vein.    Echo 11/16/17: Study Conclusions - Left ventricle: The cavity size was normal. Wall thickness was   normal. Systolic function was normal. The estimated ejection   fraction was in the range of 55% to 60%. Wall motion was normal;   there were no regional wall motion abnormalities. Doppler   parameters are consistent with abnormal left ventricular   relaxation (grade 1 diastolic dysfunction). - Right atrium: The atrium was mildly dilated.   Carotid US 08/16/17: IMPRESSION: Minor carotid atherosclerosis. No hemodynamically significant ICA stenosis. Degree of narrowing less than 50% bilaterally by ultrasound criteria. Patent antegrade vertebral flow bilaterally.   Reported a remote history of a stress test in the 1980's.   Past Medical History:  Diagnosis Date  . A-fib [facility])    PMH: 2000-2001  . Arthritis   . Cancer ([place])    melanoma 2018  . DVT (deep venous thrombosis) (HCC)    Left brachial vein DVT (01/30/19, following fracture)  . ETOH abuse   . GERD (gastroesophageal reflux disease)   . Sleep apnea    do not wear CPAP  . Stroke [facility])    " mini"  . Vertigo   . Wears glasses     Past Surgical History:  Procedure Laterality Date  . ANKLE FRACTURE SURGERY Right 2003  . COLONOSCOPY  2018  . ELBOW SURGERY Left 1964  . melanoma removal  04/2017   Lt shoulder  . RETINAL DETACHMENT SURGERY Right 1971  . TONSILLECTOMY      MEDICATIONS: No current  facility-administered medications for this encounter.    Marland Kitchen acetaminophen (TYLENOL) 650 MG CR tablet  . aspirin 81 MG tablet  . Cyanocobalamin (B-12 PO)  . Omega-3 Fatty Acids (OMEGA-3 PO)  . rosuvastatin (CRESTOR) 5 MG tablet  . rivaroxaban (XARELTO) 20 MG TABS tablet    Myra Gianotti, PA-C Surgical Short Stay/Anesthesiology [facility] Phone [phone] [facility] Phone [phone] 03/27/2019 2:49 PM

## 2019-03-27 NOTE — Assessment & Plan Note (Signed)
In a patient with a minimal tobacco history.  The mass has both solid and groundglass components, may have some area of cavitation.  Suspicious for primary malignancy.  He needs a tissue diagnosis.  Based on the cytology, PFT, PET scan to look for any distant disease, he may end up being a surgical candidate for IIb disease.  He is set up for navigational bronchoscopy on 9/23.  Keep Xarelto on hold.

## 2019-03-27 NOTE — Telephone Encounter (Signed)
Called [place] and spoke with Sherlynn Stalls who states the pt is needing pre-op orders for his ENB tomorrow 03/28/2019.   Dr. Lamonte Sakai please advise. Thanks.

## 2019-03-28 ENCOUNTER — Ambulatory Visit (HOSPITAL_COMMUNITY)
Admission: RE | Admit: 2019-03-28 | Discharge: 2019-03-28 | Disposition: A | Discharge status: Home / Self Care | Payer: Medicare Other | Attending: Emergency Medicine | Admitting: Emergency Medicine

## 2019-03-28 ENCOUNTER — Encounter (HOSPITAL_COMMUNITY)
Admission: RE | Disposition: A | Discharge status: Home / Self Care | Payer: Self-pay | Source: Home / Self Care | Attending: Emergency Medicine

## 2019-03-28 ENCOUNTER — Ambulatory Visit (HOSPITAL_COMMUNITY): Payer: Medicare Other | Admitting: Vascular Surgery

## 2019-03-28 ENCOUNTER — Ambulatory Visit (HOSPITAL_COMMUNITY): Payer: Medicare Other

## 2019-03-28 ENCOUNTER — Encounter (HOSPITAL_COMMUNITY): Payer: Self-pay | Admitting: Emergency Medicine

## 2019-03-28 DIAGNOSIS — Z7901 Long term (current) use of anticoagulants: Secondary | ICD-10-CM | POA: Insufficient documentation

## 2019-03-28 DIAGNOSIS — Z86718 Personal history of other venous thrombosis and embolism: Secondary | ICD-10-CM | POA: Diagnosis not present

## 2019-03-28 DIAGNOSIS — M1990 Unspecified osteoarthritis, unspecified site: Secondary | ICD-10-CM | POA: Insufficient documentation

## 2019-03-28 DIAGNOSIS — Z419 Encounter for procedure for purposes other than remedying health state, unspecified: Secondary | ICD-10-CM

## 2019-03-28 DIAGNOSIS — R918 Other nonspecific abnormal finding of lung field: Secondary | ICD-10-CM | POA: Diagnosis present

## 2019-03-28 DIAGNOSIS — Z85820 Personal history of malignant melanoma of skin: Secondary | ICD-10-CM | POA: Insufficient documentation

## 2019-03-28 DIAGNOSIS — K219 Gastro-esophageal reflux disease without esophagitis: Secondary | ICD-10-CM | POA: Diagnosis not present

## 2019-03-28 DIAGNOSIS — Z8673 Personal history of transient ischemic attack (TIA), and cerebral infarction without residual deficits: Secondary | ICD-10-CM | POA: Diagnosis not present

## 2019-03-28 DIAGNOSIS — G4733 Obstructive sleep apnea (adult) (pediatric): Secondary | ICD-10-CM | POA: Insufficient documentation

## 2019-03-28 DIAGNOSIS — I2510 Atherosclerotic heart disease of native coronary artery without angina pectoris: Secondary | ICD-10-CM | POA: Insufficient documentation

## 2019-03-28 DIAGNOSIS — Z87891 Personal history of nicotine dependence: Secondary | ICD-10-CM | POA: Diagnosis not present

## 2019-03-28 DIAGNOSIS — I4891 Unspecified atrial fibrillation: Secondary | ICD-10-CM | POA: Diagnosis not present

## 2019-03-28 DIAGNOSIS — Z79899 Other long term (current) drug therapy: Secondary | ICD-10-CM | POA: Insufficient documentation

## 2019-03-28 DIAGNOSIS — Z7982 Long term (current) use of aspirin: Secondary | ICD-10-CM | POA: Diagnosis not present

## 2019-03-28 DIAGNOSIS — I10 Essential (primary) hypertension: Secondary | ICD-10-CM | POA: Insufficient documentation

## 2019-03-28 DIAGNOSIS — Z9889 Other specified postprocedural states: Secondary | ICD-10-CM

## 2019-03-28 DIAGNOSIS — C3432 Malignant neoplasm of lower lobe, left bronchus or lung: Secondary | ICD-10-CM | POA: Insufficient documentation

## 2019-03-28 DIAGNOSIS — I700 Atherosclerosis of aorta: Secondary | ICD-10-CM | POA: Diagnosis not present

## 2019-03-28 HISTORY — DX: Unspecified osteoarthritis, unspecified site: M19.90

## 2019-03-28 HISTORY — DX: Unspecified atrial fibrillation: I48.91

## 2019-03-28 HISTORY — PX: VIDEO BRONCHOSCOPY WITH ENDOBRONCHIAL NAVIGATION: SHX6175

## 2019-03-28 HISTORY — DX: Sleep apnea, unspecified: G47.30

## 2019-03-28 HISTORY — DX: Acute embolism and thrombosis of unspecified deep veins of unspecified lower extremity: I82.409

## 2019-03-28 HISTORY — DX: Gastro-esophageal reflux disease without esophagitis: K21.9

## 2019-03-28 HISTORY — DX: Presence of spectacles and contact lenses: Z97.3

## 2019-03-28 HISTORY — DX: Malignant (primary) neoplasm, unspecified: C80.1

## 2019-03-28 HISTORY — DX: Alcohol abuse, uncomplicated: F10.10

## 2019-03-28 SURGERY — VIDEO BRONCHOSCOPY WITH ENDOBRONCHIAL NAVIGATION
Anesthesia: General | Site: Chest

## 2019-03-28 MED ORDER — MIDAZOLAM HCL 2 MG/2ML IJ SOLN
INTRAMUSCULAR | Status: DC | PRN
  Administered 2019-03-28: 2 mg via INTRAVENOUS

## 2019-03-28 MED ORDER — FENTANYL CITRATE (PF) 250 MCG/5ML IJ SOLN
INTRAMUSCULAR | Status: AC
  Filled 2019-03-28: qty 5

## 2019-03-28 MED ORDER — ROCURONIUM BROMIDE 10 MG/ML (PF) SYRINGE
PREFILLED_SYRINGE | INTRAVENOUS | Status: DC | PRN
  Administered 2019-03-28: 50 mg via INTRAVENOUS

## 2019-03-28 MED ORDER — ONDANSETRON HCL 4 MG/2ML IJ SOLN: 4.0000 mg | Freq: Once | INTRAMUSCULAR | Status: DC | PRN

## 2019-03-28 MED ORDER — PROPOFOL 10 MG/ML IV BOLUS
INTRAVENOUS | Status: DC | PRN
  Administered 2019-03-28: 110 mg via INTRAVENOUS

## 2019-03-28 MED ORDER — DEXAMETHASONE SODIUM PHOSPHATE 10 MG/ML IJ SOLN
INTRAMUSCULAR | Status: DC | PRN
  Administered 2019-03-28: 10 mg via INTRAVENOUS

## 2019-03-28 MED ORDER — RIVAROXABAN 20 MG PO TABS: 20.0000 mg | ORAL_TABLET | Freq: Every day | ORAL | Status: DC

## 2019-03-28 MED ORDER — LIDOCAINE 2% (20 MG/ML) 5 ML SYRINGE
INTRAMUSCULAR | Status: DC | PRN
  Administered 2019-03-28: 100 mg via INTRAVENOUS

## 2019-03-28 MED ORDER — FENTANYL CITRATE (PF) 250 MCG/5ML IJ SOLN
INTRAMUSCULAR | Status: DC | PRN
  Administered 2019-03-28 (×3): 50 ug via INTRAVENOUS

## 2019-03-28 MED ORDER — SODIUM CHLORIDE 0.9 % IV SOLN
INTRAVENOUS | Status: DC | PRN
  Administered 2019-03-28: 15 ug/min via INTRAVENOUS

## 2019-03-28 MED ORDER — EPHEDRINE SULFATE 50 MG/ML IJ SOLN
INTRAMUSCULAR | Status: DC | PRN
  Administered 2019-03-28: 10 mg via INTRAVENOUS

## 2019-03-28 MED ORDER — MEPERIDINE HCL 25 MG/ML IJ SOLN: 6.2500 mg | INTRAMUSCULAR | Status: DC | PRN

## 2019-03-28 MED ORDER — PROPOFOL 10 MG/ML IV BOLUS
INTRAVENOUS | Status: AC
  Filled 2019-03-28: qty 20

## 2019-03-28 MED ORDER — ONDANSETRON HCL 4 MG/2ML IJ SOLN
INTRAMUSCULAR | Status: DC | PRN
  Administered 2019-03-28: 4 mg via INTRAVENOUS

## 2019-03-28 MED ORDER — FENTANYL CITRATE (PF) 100 MCG/2ML IJ SOLN: 25.0000 ug | INTRAMUSCULAR | Status: DC | PRN

## 2019-03-28 MED ORDER — 0.9 % SODIUM CHLORIDE (POUR BTL) OPTIME
TOPICAL | Status: DC | PRN
  Administered 2019-03-28: 1000 mL

## 2019-03-28 MED ORDER — MIDAZOLAM HCL 2 MG/2ML IJ SOLN
INTRAMUSCULAR | Status: AC
  Filled 2019-03-28: qty 2

## 2019-03-28 MED ORDER — SUGAMMADEX SODIUM 200 MG/2ML IV SOLN
INTRAVENOUS | Status: DC | PRN
  Administered 2019-03-28: 200 mg via INTRAVENOUS

## 2019-03-28 MED ORDER — LACTATED RINGERS IV SOLN
INTRAVENOUS | Status: DC
  Administered 2019-03-28: 09:00:00 via INTRAVENOUS

## 2019-03-28 SURGICAL SUPPLY — 44 items
ADAPTER BRONCHOSCOPE OLYMPUS (ADAPTER) ×3 IMPLANT
ADAPTER VALVE BIOPSY EBUS (MISCELLANEOUS) IMPLANT
ADPR BSCP OLMPS EDG (ADAPTER) ×1
ADPTR VALVE BIOPSY EBUS (MISCELLANEOUS)
BRUSH CYTOL CELLEBRITY 1.5X140 (MISCELLANEOUS) ×5 IMPLANT
BRUSH SUPERTRAX BIOPSY (INSTRUMENTS) IMPLANT
BRUSH SUPERTRAX NDL-TIP CYTO (INSTRUMENTS) ×5 IMPLANT
CANISTER SUCT 3000ML PPV (MISCELLANEOUS) ×3 IMPLANT
CHANNEL WORK EXTEND EDGE 180 (KITS) IMPLANT
CHANNEL WORK EXTEND EDGE 90 (KITS) IMPLANT
CONT SPEC 4OZ CLIKSEAL STRL BL (MISCELLANEOUS) ×3 IMPLANT
COVER BACK TABLE 60X90IN (DRAPES) ×3 IMPLANT
COVER WAND RF STERILE (DRAPES) ×3 IMPLANT
FILTER STRAW FLUID ASPIR (MISCELLANEOUS) IMPLANT
FORCEPS BIOP SUPERTRX PREMAR (INSTRUMENTS) ×5 IMPLANT
GAUZE SPONGE 4X4 12PLY STRL (GAUZE/BANDAGES/DRESSINGS) ×3 IMPLANT
GLOVE BIO SURGEON STRL SZ7.5 (GLOVE) ×6 IMPLANT
GOWN STRL REUS W/ TWL LRG LVL3 (GOWN DISPOSABLE) ×2 IMPLANT
GOWN STRL REUS W/TWL LRG LVL3 (GOWN DISPOSABLE) ×6
KIT CLEAN ENDO COMPLIANCE (KITS) ×3 IMPLANT
KIT LOCATABLE GUIDE (CANNULA) IMPLANT
KIT MARKER FIDUCIAL DELIVERY (KITS) IMPLANT
KIT PROCEDURE EDGE 180 (KITS) IMPLANT
KIT PROCEDURE EDGE 90 (KITS) ×2 IMPLANT
KIT TURNOVER KIT B (KITS) ×3 IMPLANT
MARKER SKIN DUAL TIP RULER LAB (MISCELLANEOUS) ×3 IMPLANT
NDL SUPERTRX PREMARK BIOPSY (NEEDLE) ×1 IMPLANT
NEEDLE SUPERTRX PREMARK BIOPSY (NEEDLE) ×6 IMPLANT
NS IRRIG 1000ML POUR BTL (IV SOLUTION) ×3 IMPLANT
OIL SILICONE PENTAX (PARTS (SERVICE/REPAIRS)) ×3 IMPLANT
PAD ARMBOARD 7.5X6 YLW CONV (MISCELLANEOUS) ×6 IMPLANT
PATCHES PATIENT (LABEL) ×9 IMPLANT
SYR 20ML ECCENTRIC (SYRINGE) ×3 IMPLANT
SYR 20ML LL LF (SYRINGE) ×3 IMPLANT
SYR 50ML SLIP (SYRINGE) ×3 IMPLANT
TOWEL GREEN STERILE FF (TOWEL DISPOSABLE) ×3 IMPLANT
TRAP SPECIMEN MUCOUS 40CC (MISCELLANEOUS) IMPLANT
TUBE CONNECTING 20'X1/4 (TUBING) ×1
TUBE CONNECTING 20X1/4 (TUBING) ×2 IMPLANT
UNDERPAD 30X30 (UNDERPADS AND DIAPERS) ×3 IMPLANT
VALVE BIOPSY  SINGLE USE (MISCELLANEOUS) ×2
VALVE BIOPSY SINGLE USE (MISCELLANEOUS) ×1 IMPLANT
VALVE SUCTION BRONCHIO DISP (MISCELLANEOUS) ×3 IMPLANT
WATER STERILE IRR 1000ML POUR (IV SOLUTION) ×3 IMPLANT

## 2019-03-28 NOTE — Interval H&P Note (Signed)
History and Physical Interval Note:  03/28/2019 8:34 AM  Albert Perez  has presented today for surgery, with the diagnosis of LUNG MASS.  The various methods of treatment have been discussed with the patient and family. After consideration of risks, benefits and other options for treatment, the patient has consented to  Procedure(s): [place] (N/A) as a surgical intervention.  The patient's history has been reviewed, patient examined, no change in status, stable for surgery.  I have reviewed the patient's chart and labs.  Questions were answered to the patient's satisfaction.     Baltazar Apo, MD, PhD 03/28/2019, 8:35 AM [facility] [phone] or if no answer [phone]

## 2019-03-28 NOTE — Anesthesia Postprocedure Evaluation (Signed)
Anesthesia Post Note  Patient: Albert Perez  Procedure(s) Performed: VIDEO BRONCHOSCOPY WITH ENDOBRONCHIAL NAVIGATION and Biopsies (N/A Chest)     Patient location during evaluation: PACU Anesthesia Type: General Level of consciousness: sedated and patient cooperative Pain management: pain level controlled Vital Signs Assessment: post-procedure vital signs reviewed and stable Respiratory status: spontaneous breathing Cardiovascular status: stable Anesthetic complications: no    Last Vitals:  Vitals:   03/28/19 1105 03/28/19 1120  BP: (!) 127/56 (!) 129/57  Pulse: (!) 54 (!) 51  Resp: 20 14  Temp:  (!) 36.1 C  SpO2: 94% 91%    Last Pain:  Vitals:   03/28/19 1120  TempSrc:   PainSc: 0-No pain                 Nolon Nations

## 2019-03-28 NOTE — OR Nursing (Signed)
Cytology Br[place] for Nursing Reserch  Per Gladstone Lighter from Patient.

## 2019-03-28 NOTE — Transfer of Care (Signed)
Immediate Anesthesia Transfer of Care Note  Patient: Albert Perez  Procedure(s) Performed: VIDEO BRONCHOSCOPY WITH ENDOBRONCHIAL NAVIGATION and Biopsies (N/A Chest)  Patient Location: PACU  Anesthesia Type:General  Level of Consciousness: awake, alert  and oriented  Airway & Oxygen Therapy: Patient Spontanous Breathing and Patient connected to nasal cannula oxygen  Post-op Assessment: Report given to RN and Post -op Vital signs reviewed and stable  Post vital signs: Reviewed and stable  Last Vitals:  Vitals Value Taken Time  BP 127/50 03/28/19 1050  Temp    Pulse 56 03/28/19 1050  Resp 12 03/28/19 1050  SpO2 97 % 03/28/19 1050  Vitals shown include unvalidated device data.  Last Pain:  Vitals:   03/28/19 0821  TempSrc: Oral      Patients Stated Pain Goal: 3 (74/14/23 9532)  Complications: No apparent anesthesia complications

## 2019-03-28 NOTE — Discharge Instructions (Signed)
Flexible Bronchoscopy, Care After This sheet gives you information about how to care for yourself after your test. Your doctor may also give you more specific instructions. If you have problems or questions, contact your doctor. Follow these instructions at home: Eating and drinking  Do not eat or drink anything (not even water) for 2 hours after your test, or until your numbing medicine (local anesthetic) wears off.  When your numbness is gone and your cough and gag reflexes have come back, you may: ? Eat only soft foods. ? Slowly drink liquids.  The day after the test, go back to your normal diet. Driving  Do not drive for 24 hours if you were given a medicine to help you relax (sedative).  Do not drive or use heavy machinery while taking prescription pain medicine. General instructions   Take over-the-counter and prescription medicines only as told by your doctor.  Return to your normal activities as told. Ask what activities are safe for you.  Do not use any products that have nicotine or tobacco in them. This includes cigarettes and e-cigarettes. If you need help quitting, ask your doctor.  Keep all follow-up visits as told by your doctor. This is important. It is very important if you had a tissue sample (biopsy) taken. Get help right away if:  You have shortness of breath that gets worse.  You get light-headed.  You feel like you are going to pass out (faint).  You have chest pain.  You cough up: ? More than a little blood. ? More blood than before. Summary  Do not eat or drink anything (not even water) for 2 hours after your test, or until your numbing medicine wears off.  Do not use cigarettes. Do not use e-cigarettes.  Get help right away if you have chest pain.   Please call our office for any questions or concerns.  [phone].   This information is not intended to replace advice given to you by your health care provider. Make sure you discuss any  questions you have with your health care provider. Document Released: 04/18/2009 Document Revised: 06/03/2017 Document Reviewed: 07/09/2016 Elsevier Patient Education  2020 [employer].

## 2019-03-28 NOTE — Op Note (Signed)
Video Bronchoscopy with Electromagnetic Navigation Procedure Note  Date of Operation: 03/28/2019  Pre-op Diagnosis: Left lower lobe mass  Post-op Diagnosis: Same  Surgeon: Baltazar Apo  Assistants: None  Anesthesia: General endotracheal anesthesia  Operation: Flexible video fiberoptic bronchoscopy with electromagnetic navigation and biopsies.  Estimated Blood Loss: Minimal  Complications: None apparent  Indications and History: Albert Perez is a 68 y.o. male with history of tobacco use.  He presented with hemoptysis after he was started on Xarelto for an upper extremity DVT.  CT chest has revealed a mixed density left lower lobe mass.  Recommendation was made to achieve a tissue diagnosis via navigational bronchoscopy with biopsies.  The risks, benefits, complications, treatment options and expected outcomes were discussed with the patient.  The possibilities of pneumothorax, pneumonia, reaction to medication, pulmonary aspiration, perforation of a viscus, bleeding, failure to diagnose a condition and creating a complication requiring transfusion or operation were discussed with the patient who freely signed the consent.    Description of Procedure: The patient was seen in the Preoperative Area, was examined and was deemed appropriate to proceed.  The patient was taken to OR 10, identified as Albert Perez and the procedure verified as Flexible Video Fiberoptic Bronchoscopy.  A Time Out was held and the above information confirmed.   Prior to the date of the procedure a high-resolution CT scan of the chest was performed. Utilizing [place] a virtual tracheobronchial tree was generated to allow the creation of distinct navigation pathways to the patient's parenchymal abnormalities.  Targets were planned in both the solid component of the left lower lobe mass (target 1) and the adjacent groundglass component of the left lower lobe mass (target 2).  After being taken to the  operating room general anesthesia was initiated and the patient  was orally intubated. The video fiberoptic bronchoscope was introduced via the endotracheal tube and a general inspection was performed which showed normal airways throughout.  There was some small amount of bleeding noted from 1 of the subsegmental airways in the superior segment of the left lower lobe. The extendable working channel and locator guide were introduced into the bronchoscope. The distinct navigation pathways prepared prior to this procedure were then utilized to navigate to within 1 cm of the center of patient's lesions identified on CT scan. The extendable working channel was secured into place and the locator guide was withdrawn. Under fluoroscopic guidance transbronchial needle brushings, transbronchial Wang needle biopsies, and transbronchial forceps biopsies were performed at the solid component labeled target 1 to be sent for cytology and pathology.  Attention was then turned to the groundglass component and transbronchial brushings, transbronchial biopsies were performed in this region also (target 2).  A bronchioalveolar lavage was performed in the superior segment of the left lower lobe and sent for cytology and microbiology (bacterial, fungal, AFB smears and cultures). At the end of the procedure a general airway inspection was performed and there was no evidence of active bleeding. The bronchoscope was removed.  The patient tolerated the procedure well. There was no significant blood loss and there were no obvious complications. A post-procedural chest x-ray is pending.  Samples: 1. Transbronchial needle brushings from left lower lobe nodule (target 1) 2. Transbronchial Wang needle biopsies from left lower lobe nodule (target 1) 3. Transbronchial forceps biopsies from left lower lobe nodule (target 1) 4.  Transbronchial brushings from left lower lobe groundglass opacity (target 2) 5.  Transbronchial forceps biopsies  from left lower lobe groundglass opacity (  target 2) 6. Bronchoalveolar lavage from left lower lobe superior segment  Plans:  The patient will be discharged from the PACU to home when recovered from anesthesia and after chest x-ray is reviewed. We will review the cytology, pathology and microbiology results with the patient when they become available. Outpatient followup will be with Dr. Lamonte Sakai.   Baltazar Apo, MD, PhD 03/28/2019, 10:48 AM [facility] [phone] or if no answer [phone]

## 2019-03-28 NOTE — Anesthesia Procedure Notes (Signed)
Procedure Name: Intubation Date/Time: 03/28/2019 9:35 AM Performed by: Clearnce Sorrel, CRNA Pre-anesthesia Checklist: Patient identified, Emergency Drugs available, Suction available, Patient being monitored and Timeout performed Patient Re-evaluated:Patient Re-evaluated prior to induction Oxygen Delivery Method: Circle system utilized Preoxygenation: Pre-oxygenation with 100% oxygen Induction Type: IV induction Ventilation: Mask ventilation without difficulty Laryngoscope Size: Mac and 4 Grade View: Grade I Tube type: Oral Tube size: 8.5 mm Number of attempts: 1 Airway Equipment and Method: Stylet Placement Confirmation: positive ETCO2,  ETT inserted through vocal cords under direct vision and breath sounds checked- equal and bilateral Secured at: 23 cm Tube secured with: Tape Dental Injury: Teeth and Oropharynx as per pre-operative assessment

## 2019-03-29 ENCOUNTER — Encounter (HOSPITAL_COMMUNITY): Payer: Self-pay | Admitting: Emergency Medicine

## 2019-03-29 ENCOUNTER — Other Ambulatory Visit: Payer: Self-pay

## 2019-03-29 LAB — CYTOLOGY - NON PAP

## 2019-03-30 ENCOUNTER — Telehealth: Payer: Self-pay | Admitting: Emergency Medicine

## 2019-03-30 LAB — CULTURE, RESPIRATORY W GRAM STAIN: Culture: NORMAL

## 2019-03-30 LAB — SURGICAL PATHOLOGY

## 2019-03-30 LAB — CYTOLOGY - NON PAP

## 2019-03-30 NOTE — Telephone Encounter (Signed)
Called patient to review biopsy results. Shows NSCLCA consistent with adenoCA. He was unavailable, left a voice mail that we would try him back.

## 2019-04-02 ENCOUNTER — Telehealth: Payer: Self-pay | Admitting: *Deleted

## 2019-04-02 NOTE — Telephone Encounter (Signed)
Oncology Nurse Navigator Documentation  Oncology Nurse Navigator Flowsheets 04/02/2019  Diagnosis Status -  Navigator Follow Up Date: -  Navigator Follow Up Reason: -  Navigator Location [facility]  Referral Date to RadOnc/MedOnc -  Navigator Encounter Type Telephone/I received a message today from Dr. Lamonte Sakai that patient needs to be seen at [place].  I called patient to update and he verbalized understanding of appt time and place.  I will udpate scheduling for tcts and rad onc on appt.  I will update path dept to put him on for discussion.   Telephone -  Abnormal Finding Date -  Treatment Phase Pre-Tx/Tx Discussion  Barriers/Navigation Needs Coordination of Care;Education  Education Other  Interventions Coordination of Care;Education  Coordination of Care Appts;Other  Education Method Verbal  Acuity Level 3-Moderate Needs (3-4 Barriers Identified)  Time Spent with Patient 45

## 2019-04-02 NOTE — Telephone Encounter (Signed)
Spoke with Albert Perez regarding his bronchoscopy results.  Show adenocarcinoma as expected.  He has a PET scan this Wednesday, is planning to go to [facility] on Thursday afternoon.  He has not yet had PFT but he may be a surgical candidate depending on the staging evaluation.  He tolerated the bronchoscopy well, no complications noted except for some sore throat which is resolving.  He will call me as needed

## 2019-04-04 ENCOUNTER — Other Ambulatory Visit: Payer: Self-pay

## 2019-04-04 ENCOUNTER — Ambulatory Visit (HOSPITAL_COMMUNITY)
Admission: RE | Admit: 2019-04-04 | Discharge: 2019-04-04 | Disposition: A | Discharge status: Home / Self Care | Payer: Medicare Other | Source: Ambulatory Visit | Attending: Internal Medicine | Admitting: Internal Medicine

## 2019-04-04 DIAGNOSIS — I2510 Atherosclerotic heart disease of native coronary artery without angina pectoris: Secondary | ICD-10-CM | POA: Diagnosis not present

## 2019-04-04 DIAGNOSIS — N433 Hydrocele, unspecified: Secondary | ICD-10-CM | POA: Diagnosis not present

## 2019-04-04 DIAGNOSIS — R911 Solitary pulmonary nodule: Secondary | ICD-10-CM | POA: Diagnosis present

## 2019-04-04 LAB — GLUCOSE, CAPILLARY: Glucose-Capillary: 106 mg/dL — ABNORMAL HIGH (ref 70–99)

## 2019-04-04 MED ORDER — FLUDEOXYGLUCOSE F - 18 (FDG) INJECTION
9.0200 | Freq: Once | INTRAVENOUS | Status: AC | PRN
  Administered 2019-04-04: 9.02 via INTRAVENOUS

## 2019-04-05 ENCOUNTER — Encounter: Payer: Self-pay | Admitting: Internal Medicine

## 2019-04-05 ENCOUNTER — Encounter: Payer: Self-pay | Admitting: *Deleted

## 2019-04-05 ENCOUNTER — Encounter: Payer: Medicare Other | Admitting: Thoracic Surgery (Cardiothoracic Vascular Surgery)

## 2019-04-05 ENCOUNTER — Other Ambulatory Visit: Payer: Self-pay

## 2019-04-05 ENCOUNTER — Other Ambulatory Visit: Payer: Self-pay | Admitting: *Deleted

## 2019-04-05 ENCOUNTER — Inpatient Hospital Stay: Payer: Medicare Other

## 2019-04-05 ENCOUNTER — Inpatient Hospital Stay: Payer: Medicare Other | Attending: Internal Medicine | Admitting: Internal Medicine

## 2019-04-05 ENCOUNTER — Ambulatory Visit
Admission: RE | Admit: 2019-04-05 | Discharge: 2019-04-05 | Disposition: A | Discharge status: Home / Self Care | Payer: Medicare Other | Source: Ambulatory Visit | Attending: Radiation Oncology | Admitting: Radiation Oncology

## 2019-04-05 VITALS — BP 155/54 | HR 55 | Temp 98.9°F | Resp 18 | Wt 184.8 lb

## 2019-04-05 VITALS — BP 155/54 | HR 55 | Temp 98.9°F | Resp 18 | Ht 67.5 in | Wt 184.8 lb

## 2019-04-05 DIAGNOSIS — Z7189 Other specified counseling: Secondary | ICD-10-CM

## 2019-04-05 DIAGNOSIS — Z8673 Personal history of transient ischemic attack (TIA), and cerebral infarction without residual deficits: Secondary | ICD-10-CM | POA: Insufficient documentation

## 2019-04-05 DIAGNOSIS — G893 Neoplasm related pain (acute) (chronic): Secondary | ICD-10-CM | POA: Insufficient documentation

## 2019-04-05 DIAGNOSIS — Z85820 Personal history of malignant melanoma of skin: Secondary | ICD-10-CM | POA: Diagnosis not present

## 2019-04-05 DIAGNOSIS — Z7982 Long term (current) use of aspirin: Secondary | ICD-10-CM | POA: Insufficient documentation

## 2019-04-05 DIAGNOSIS — C3432 Malignant neoplasm of lower lobe, left bronchus or lung: Secondary | ICD-10-CM | POA: Insufficient documentation

## 2019-04-05 DIAGNOSIS — R918 Other nonspecific abnormal finding of lung field: Secondary | ICD-10-CM

## 2019-04-05 DIAGNOSIS — C7951 Secondary malignant neoplasm of bone: Secondary | ICD-10-CM | POA: Insufficient documentation

## 2019-04-05 DIAGNOSIS — Z7901 Long term (current) use of anticoagulants: Secondary | ICD-10-CM | POA: Insufficient documentation

## 2019-04-05 DIAGNOSIS — C3490 Malignant neoplasm of unspecified part of unspecified bronchus or lung: Secondary | ICD-10-CM

## 2019-04-05 DIAGNOSIS — I10 Essential (primary) hypertension: Secondary | ICD-10-CM | POA: Diagnosis not present

## 2019-04-05 DIAGNOSIS — Z86718 Personal history of other venous thrombosis and embolism: Secondary | ICD-10-CM | POA: Insufficient documentation

## 2019-04-05 DIAGNOSIS — I4891 Unspecified atrial fibrillation: Secondary | ICD-10-CM | POA: Diagnosis not present

## 2019-04-05 DIAGNOSIS — C3492 Malignant neoplasm of unspecified part of left bronchus or lung: Secondary | ICD-10-CM

## 2019-04-05 DIAGNOSIS — Z5111 Encounter for antineoplastic chemotherapy: Secondary | ICD-10-CM

## 2019-04-05 DIAGNOSIS — Z79899 Other long term (current) drug therapy: Secondary | ICD-10-CM | POA: Diagnosis not present

## 2019-04-05 NOTE — Progress Notes (Signed)
The proposed treatment discussed in cancer conference 04/05/19 is for discussion purpose only and is not a binding recommendation.  The patient was not physically examined nor present for their treatment options.  Therefore, final treatment plans cannot be decided.

## 2019-04-05 NOTE — Progress Notes (Signed)
Radiation Oncology         [phone] ________________________________  Multidisciplinary Thoracic Oncology Clinic [facility]) Initial Outpatient Consultation  Name: Albert Perez MRN: [id number]  Date: 04/05/2019  DOB: 10-28-1950  CC:Aura Dials, MD  Curt Bears, MD   REFERRING PHYSICIAN: Curt Bears, MD  DIAGNOSIS: Stage IV (T3, N0, M1c) non-small cell lung cancer, adenocarcinoma presented with cavitary left lower lobe lung mass in addition to other pulmonary nodules and scattered hypermetabolic osseous metastatic disease involving the spine, ribs and bony pelvis as well as left proximal humeral with  pathologic fracture  HISTORY OF PRESENT ILLNESS::Albert Perez is a 68 y.o. male who presented for a scheduled follow up and reported new onset hemoptysis the night before. He underwent chest x-ray that day, on 02/09/2019, which showed a possible mass. He proceeded to chest CT on 02/21/2019, which revealed a 4 cm mass-like consolidation in the left lower lobe.  He was referred to Dr. Julien Nordmann on 03/22/2019, who ordered PET scan and referred the patient for surgical consideration. He met with Dr. Lamonte Sakai on 03/27/2019, who recommended navigational bronchoscopy. Performed the following day, pathology and cytology from the procedure showed results consistent with malignant cell consistent with non-small cell carcinoma.  He underwent PET scan yesterday, 04/04/2019, which showed: highly hypermetabolic 2.4 cm LLL mass; hypermetabolic peribronchovascular nodule in the RML likely metastatic; fairly metabolic small lingular nodule probably metastatic; scattered hypermetabolic osseous lesions compatible with metastatic disease, involving the spine, ribs, and bony pelvis; possible metastatic lesion along the left proximal humeral fracture; pathologic fracture the right 9th rib.  .  The patient was referred today for presentation in the multidisciplinary conference.  Radiology studies and pathology slides  were presented there for review and discussion of treatment options.  A consensus was discussed regarding potential next steps.  PREVIOUS RADIATION THERAPY: No  PAST MEDICAL HISTORY:  has a past medical history of A-fib ([place]), Arthritis, Cancer ([place]), DVT (deep venous thrombosis) ([place]), ETOH abuse, GERD (gastroesophageal reflux disease), Sleep apnea, Stroke ([place]), Vertigo, and Wears glasses.    PAST SURGICAL HISTORY: Past Surgical History:  Procedure Laterality Date  . ANKLE FRACTURE SURGERY Right 2003  . COLONOSCOPY  2018  . ELBOW SURGERY Left 1964  . melanoma removal  04/2017   Lt shoulder  . RETINAL DETACHMENT SURGERY Right 1971  . TONSILLECTOMY    . VIDEO BRONCHOSCOPY WITH ENDOBRONCHIAL NAVIGATION N/A 03/28/2019   Procedure: VIDEO BRONCHOSCOPY WITH ENDOBRONCHIAL NAVIGATION and Biopsies;  Surgeon: Collene Gobble, MD;  Location: [facility] OR;  Service: Thoracic;  Laterality: N/A;    FAMILY HISTORY: family history includes Aneurysm in his mother; Colon cancer in his brother; Prostate cancer in his brother.  SOCIAL HISTORY:  reports that he quit smoking about 4 weeks ago. His smoking use included cigarettes. He has a 10.00 pack-year smoking history. He has never used smokeless tobacco. He reports current alcohol use of about 24.0 standard drinks of alcohol per week. He reports current drug use. Drug: Marijuana.  ALLERGIES: Patient has no known allergies.  MEDICATIONS:  Current Outpatient Medications  Medication Sig Dispense Refill  . acetaminophen (TYLENOL) 650 MG CR tablet Take 1,300 mg by mouth every 8 (eight) hours as needed for pain.    Marland Kitchen aspirin 81 MG tablet Take 1 tablet (81 mg total) by mouth daily.    . Cyanocobalamin (B-12 PO) Take 1,000 mg by mouth daily.     . Omega-3 Fatty Acids (OMEGA-3 PO) Take 1,200 mg by mouth daily.     Marland Kitchen  rivaroxaban (XARELTO) 20 MG TABS tablet Take 1 tablet (20 mg total) by mouth daily with supper. Okay to restart this medication on Saturday, 03/31/2019. 30  tablet   . rosuvastatin (CRESTOR) 5 MG tablet Take 5 mg by mouth daily.  2   No current facility-administered medications for this encounter.     REVIEW OF SYSTEMS:     PHYSICAL EXAM:  weight is 184 lb 12.8 oz (83.8 kg). His temperature is 98.9 F (37.2 C). His blood pressure is 155/54 (abnormal) and his pulse is 55 (abnormal). His respiration is 18 and oxygen saturation is 100%.    General: Alert and oriented, in no acute distress HEENT: Head is normocephalic. Extraocular movements are intact. Oropharynx is clear. Neck: Neck is supple, no palpable cervical or supraclavicular lymphadenopathy. Heart: Regular in rate and rhythm with no murmurs, rubs, or gallops. Chest: Clear to auscultation bilaterally, with no rhonchi, wheezes, or rales. Abdomen: Soft, nontender, nondistended, with no rigidity or guarding. Extremities: No cyanosis or edema. Lymphatics: see Neck Exam Skin: No concerning lesions. Musculoskeletal: symmetric strength and muscle tone throughout.  Swelling in the left upper arm and some discomfort with palpation are noted.  Patient has difficulty abducting his left arm in light of pain in this region. Neurologic: Cranial nerves II through XII are grossly intact. No obvious focalities. Speech is fluent. Coordination is intact. Psychiatric: Judgment and insight are intact. Affect is appropriate.    KPS = 80  100 - Normal; no complaints; no evidence of disease. 90   - Able to carry on normal activity; minor signs or symptoms of disease. 80   - Normal activity with effort; some signs or symptoms of disease. 55   - Cares for self; unable to carry on normal activity or to do active work. 60   - Requires occasional assistance, but is able to care for most of his personal needs. 50   - Requires considerable assistance and frequent medical care. 9   - Disabled; requires special care and assistance. 63   - Severely disabled; hospital admission is indicated although death not  imminent. 63   - Very sick; hospital admission necessary; active supportive treatment necessary. 10   - Moribund; fatal processes progressing rapidly. 0     - Dead  Karnofsky DA, Abelmann Shidler, Craver LS and Burchenal [facility] [phone]) The use of the nitrogen mustards in the palliative treatment of carcinoma: with particular reference to bronchogenic carcinoma Cancer 1 [id number]  LABORATORY DATA:  Lab Results  Component Value Date   WBC 6.4 03/22/2019   HGB 13.7 03/22/2019   HCT 40.7 03/22/2019   MCV 96.0 03/22/2019   PLT 262 03/22/2019   Lab Results  Component Value Date   NA 139 03/22/2019   K 4.1 03/22/2019   CL 106 03/22/2019   CO2 24 03/22/2019   Lab Results  Component Value Date   ALT 23 03/22/2019   AST 20 03/22/2019   ALKPHOS 98 03/22/2019   BILITOT 0.7 03/22/2019    PULMONARY FUNCTION TEST:   Recent Review Flowsheet Data    There is no flowsheet data to display.      RADIOGRAPHY: Nm Pet Image Initial (pi) Skull Base To Thigh  Result Date: 04/04/2019 CLINICAL DATA:  Initial treatment strategy for pulmonary nodules. EXAM: NUCLEAR MEDICINE PET SKULL BASE TO THIGH TECHNIQUE: 9.0 mCi F-18 FDG was injected intravenously. Full-ring PET imaging was performed from the skull base to thigh after the radiotracer. CT data was obtained and used for  attenuation correction and anatomic localization. Fasting blood glucose: 106 mg/dl COMPARISON:  Chest CT from 02/21/2019 FINDINGS: Mediastinal blood pool activity: SUV max 2.4 Liver activity: SUV max 3.3 NECK: No significant abnormal hypermetabolic activity in this region. Incidental CT findings: Bilateral common carotid atherosclerotic calcification. Right scleral band. CHEST: Left lower lobe cavitary mass just posterior to the major fissure, with surrounding airspace opacity. The hypermetabolic region associated with this opacity measures 2.4 by 2.2 cm and has a maximum SUV of 12.5. Central cavitation noted. Below this there is airspace opacity  which is not overtly hypermetabolic, leading down to a new cavitary lesion measuring 2.3 by 1.8 cm on image 40/8 which likewise is not particularly hypermetabolic, maximum SUV 2.0. Primarily along the right middle lobe side of the minor fissure, a nodule measuring approximately 2.1 by 1.1 cm on image 36/8 has a maximum SUV of 4.8. A lingular nodule measuring 0.8 by 0.6 cm on image 36/8 has a maximum SUV of 1.5. Medially in the right lower lobe on image 56/8, a 0.6 by 0.5 cm right lower lobe nodule is not appreciably hypermetabolic, but is below sensitive PET-CT size thresholds. The small mediastinal lymph nodes are not appreciably hypermetabolic. Incidental CT findings: Coronary, aortic arch, and branch vessel atherosclerotic vascular disease. ABDOMEN/PELVIS: No significant abnormal hypermetabolic activity in this region. Incidental CT findings: Aortoiliac atherosclerotic vascular disease. Diffuse urinary bladder wall thickening, cystitis is not excluded. Small bilateral scrotal hydroceles. SKELETON: Scattered hypermetabolic osseous lesions present and for the most part are relatively occult on the CT data, or with only very subtle lucency. Lesions are observed in the left C7 vertebral body and pedicle; in the left T8 vertebral body; in the left L3 pedicle; in the right ninth rib; in the left eighth rib in the left iliac crest; in the right iliac bone and adjacent right sacrum; in the left sacrum; and in the right anterior and posterior acetabular walls. Index lesion in the sacrum along the SI joint has a maximum SUV of 12.0. Index lesion in the left L3 pedicle has a maximum SUV of 8.7. There is also considerable accentuated activity within along the known left proximal humeral fracture which has an irregular appearance. I suspect that this is probably a pathologic fracture given the degree of activity although healing response can sometimes simulated pathologic fracture. Maximum SUV along the left proximal humeral  healing fracture site is 10.4. Incidental CT findings: The right ninth rib lesion is likely associated with a pathologic fracture degenerative endplate sclerosis at G9-9 and L4-5 with Schmorl's nodes. Suspected T8 compression fracture. IMPRESSION: 1. 2.4 cm left lower lobe cavitary mass is highly hypermetabolic with maximum SUV of 12.5, favoring malignancy especially in the context of other findings. This lesion is associated with adjacent left lower lobe airspace opacity and a new cavitary lesion which are less hypermetabolic and probably infectious. Hypermetabolic peribronchovascular nodule in the right middle lobe just below the minor fissure, likely metastatic. There is also a smaller lingular nodule which is faintly metabolic and probably metastatic. 2. Scattered hypermetabolic osseous lesions compatible with metastatic disease, involving the spine, ribs, and bony pelvis. In addition, I suspect that there is an underlying metastatic lesion along the left proximal humeral fracture which may have predisposed to pathologic fracture. There is a pathologic fracture the right ninth rib as well. 3. Although lung cancer primary in the left lower lobe is certainly a possibility, by report the patient has a history of melanoma, and metastatic melanoma is also a differential  diagnostic consideration. 4. Other imaging findings of potential clinical significance: Aortic Atherosclerosis (ICD10-I70.0). Coronary atherosclerosis. Diffuse urinary bladder wall thickening, cystitis not excluded. Small bilateral scrotal hydroceles. Electronically Signed   By: Van Clines M.D.   On: 04/04/2019 10:01   Dg Chest Port 1 View  Result Date: 03/28/2019 CLINICAL DATA:  Bronchoscopy with biopsy. EXAM: PORTABLE CHEST 1 VIEW COMPARISON:  CT 02/21/2019. FINDINGS: Mediastinum and hilar structures normal. Mild left base subsegmental atelectasis and or scarring. No focal infiltrate. No pleural effusion or pneumothorax. Cardiomegaly  with normal pulmonary vascularity. Degenerative change thoracic spine. IMPRESSION: 1.  Cardiomegaly.  No pulmonary venous congestion. 2. Mild left base subsegmental atelectasis and or scarring. No focal infiltrate noted. No pneumothorax post bronchoscopy. Electronically Signed   By: Marcello Moores  Register   On: 03/28/2019 11:16   Dg C-arm Bronchoscopy  Result Date: 03/28/2019 C-ARM BRONCHOSCOPY: Fluoroscopy was utilized by the requesting physician.  No radiographic interpretation.      IMPRESSION: Stage IV (T3, N0, M1c) non-small cell lung cancer, adenocarcinoma.  Unfortunately the patient's PET scan yesterday shows widespread disease and therefore he would not be a candidate for surgical intervention.  Thoracic consultation this afternoon is therefore been canceled.  Patient does have some discomfort/pain in the left upper arm.  He actually is awakened at night couple times due to left arm pain.  This afternoon I reviewed the patient's prior imaging concerning his fracture of the left humerus.  Does appear in retrospect that this is a pathologic fracture and in light of his continued pain in this area would recommend a short course of palliative radiation therapy.  He does not seem to be symptomatic from his other areas of osseous metastasis.  Patient will proceed with blood test today for molecular testing.  Systemic management details are pending at this time [facility].  PLAN: Patient will be scheduled for simulation and planning next week concerning treatments to his left humerus area.  Anticipate 2 weeks of radiation therapy.     ------------------------------------------------  Blair Promise, PhD, MD  This document serves as a record of services personally performed by Gery Pray, MD. It was created on his behalf by Wilburn Mylar, a trained medical scribe. The creation of this record is based on the scribe's personal observations and the provider's statements to them. This document has been  checked and approved by the attending provider.

## 2019-04-05 NOTE — Progress Notes (Signed)
[facility] Telephone:([phone]) [phone]   Fax:([phone]) [phone]  OFFICE PROGRESS NOTE  Aura Dials, MD [place] [address]  DIAGNOSIS: Stage IV (T3, N0, M1c) non-small cell lung cancer, adenocarcinoma presented with cavitary left lower lobe lung mass in addition to other pulmonary nodules and scattered hypermetabolic osseous metastatic disease involving the spine, ribs and bony pelvis as well as left proximal humeral pathologic fracture  PRIOR THERAPY: None  CURRENT THERAPY: None.  INTERVAL HISTORY: Albert Perez 68 y.o. male returns to the clinic today for follow-up visit.  The patient is feeling fine today with no concerning complaints except for mild pain in the left arm.  He denied having any current chest pain, shortness of breath except with exertion with mild cough and no hemoptysis.  He denied having any fever or chills.  He has no nausea, vomiting, diarrhea or constipation.  He denied having any headache or visual changes.  The patient had several studies performed recently including a PET scan as well as bronchoscopy with biopsy of the left lower lobe lung mass on March 28, 2019 and the final pathology ([id number]) was consistent with a lung adenocarcinoma. Immunohistochemical stains show that the scant tumor cells are diffusely positive for TTF-1 and show focal labeling for p63 and CK 5/6 (likely nonspecific). This immunophenotype is most consistent with a lung adenocarcinoma.  The patient also had a PET scan performed recently and he is here for evaluation and discussion of his scan results as well as treatment options based on the recent biopsy.  MEDICAL HISTORY: Past Medical History:  Diagnosis Date  . A-fib [facility])    PMH: 2000-2001  . Arthritis   . Cancer ([facility])    melanoma 2018  . DVT (deep venous thrombosis) (H[facility])    Left brachial vein DVT (01/30/19, following fracture)  . ETOH abuse   . GERD (gastroesophageal reflux disease)   .  Sleep apnea    do not wear CPAP  . Stroke [facility])    " mini"  . Vertigo   . Wears glasses     ALLERGIES:  has No Known Allergies.  MEDICATIONS:  Current Outpatient Medications  Medication Sig Dispense Refill  . acetaminophen (TYLENOL) 650 MG CR tablet Take 1,300 mg by mouth every 8 (eight) hours as needed for pain.    Marland Kitchen aspirin 81 MG tablet Take 1 tablet (81 mg total) by mouth daily.    . Cyanocobalamin (B-12 PO) Take 1,000 mg by mouth daily.     . Omega-3 Fatty Acids (OMEGA-3 PO) Take 1,200 mg by mouth daily.     . rivaroxaban (XARELTO) 20 MG TABS tablet Take 1 tablet (20 mg total) by mouth daily with supper. Okay to restart this medication on Saturday, 03/31/2019. 30 tablet   . rosuvastatin (CRESTOR) 5 MG tablet Take 5 mg by mouth daily.  2   No current facility-administered medications for this visit.     SURGICAL HISTORY:  Past Surgical History:  Procedure Laterality Date  . ANKLE FRACTURE SURGERY Right 2003  . COLONOSCOPY  2018  . ELBOW SURGERY Left 1964  . melanoma removal  04/2017   Lt shoulder  . RETINAL DETACHMENT SURGERY Right 1971  . TONSILLECTOMY    . VIDEO BRONCHOSCOPY WITH ENDOBRONCHIAL NAVIGATION N/A 03/28/2019   Procedure: VIDEO BRONCHOSCOPY WITH ENDOBRONCHIAL NAVIGATION and Biopsies;  Surgeon: Collene Gobble, MD;  Location: [facility];  Service: Thoracic;  Laterality: N/A;    REVIEW  OF SYSTEMS:  Constitutional: positive for fatigue Eyes: negative Ears, nose, mouth, throat, and face: negative Respiratory: positive for cough and dyspnea on exertion Cardiovascular: negative Gastrointestinal: negative Genitourinary:negative Integument/breast: negative Hematologic/lymphatic: negative Musculoskeletal:positive for bone pain Neurological: negative Behavioral/Psych: negative Endocrine: negative Allergic/Immunologic: negative   PHYSICAL EXAMINATION: General appearance: alert, cooperative and no distress Head: Normocephalic, without obvious abnormality, atraumatic  Neck: no adenopathy, no JVD, supple, symmetrical, trachea midline and thyroid not enlarged, symmetric, no tenderness/mass/nodules Lymph nodes: Cervical, supraclavicular, and axillary nodes normal. Resp: clear to auscultation bilaterally Back: symmetric, no curvature. ROM normal. No CVA tenderness. Cardio: regular rate and rhythm, S1, S2 normal, no murmur, click, rub or gallop GI: soft, non-tender; bowel sounds normal; no masses,  no organomegaly Extremities: extremities normal, atraumatic, no cyanosis or edema Neurologic: Alert and oriented X 3, normal strength and tone. Normal symmetric reflexes. Normal coordination and gait  ECOG PERFORMANCE STATUS: 1 - Symptomatic but completely ambulatory  Blood pressure (!) 155/54, pulse (!) 55, temperature 98.9 F (37.2 C), temperature source Temporal, resp. rate 18, height 5' 7.5" (1.715 m), weight 184 lb 12.8 oz (83.8 kg), SpO2 100 %.  LABORATORY DATA: Lab Results  Component Value Date   WBC 6.4 03/22/2019   HGB 13.7 03/22/2019   HCT 40.7 03/22/2019   MCV 96.0 03/22/2019   PLT 262 03/22/2019      Chemistry      Component Value Date/Time   NA 139 03/22/2019 1341   K 4.1 03/22/2019 1341   CL 106 03/22/2019 1341   CO2 24 03/22/2019 1341   BUN 16 03/22/2019 1341   CREATININE 1.11 03/22/2019 1341      Component Value Date/Time   CALCIUM 9.1 03/22/2019 1341   ALKPHOS 98 03/22/2019 1341   AST 20 03/22/2019 1341   ALT 23 03/22/2019 1341   BILITOT 0.7 03/22/2019 1341       RADIOGRAPHIC STUDIES: Nm Pet Image Initial (pi) Skull Base To Thigh  Result Date: 04/04/2019 CLINICAL DATA:  Initial treatment strategy for pulmonary nodules. EXAM: NUCLEAR MEDICINE PET SKULL BASE TO THIGH TECHNIQUE: 9.0 mCi F-18 FDG was injected intravenously. Full-ring PET imaging was performed from the skull base to thigh after the radiotracer. CT data was obtained and used for attenuation correction and anatomic localization. Fasting blood glucose: 106 mg/dl  COMPARISON:  Chest CT from 02/21/2019 FINDINGS: Mediastinal blood pool activity: SUV max 2.4 Liver activity: SUV max 3.3 NECK: No significant abnormal hypermetabolic activity in this region. Incidental CT findings: Bilateral common carotid atherosclerotic calcification. Right scleral band. CHEST: Left lower lobe cavitary mass just posterior to the major fissure, with surrounding airspace opacity. The hypermetabolic region associated with this opacity measures 2.4 by 2.2 cm and has a maximum SUV of 12.5. Central cavitation noted. Below this there is airspace opacity which is not overtly hypermetabolic, leading down to a new cavitary lesion measuring 2.3 by 1.8 cm on image 40/8 which likewise is not particularly hypermetabolic, maximum SUV 2.0. Primarily along the right middle lobe side of the minor fissure, a nodule measuring approximately 2.1 by 1.1 cm on image 36/8 has a maximum SUV of 4.8. A lingular nodule measuring 0.8 by 0.6 cm on image 36/8 has a maximum SUV of 1.5. Medially in the right lower lobe on image 56/8, a 0.6 by 0.5 cm right lower lobe nodule is not appreciably hypermetabolic, but is below sensitive PET-CT size thresholds. The small mediastinal lymph nodes are not appreciably hypermetabolic. Incidental CT findings: Coronary, aortic arch, and branch vessel atherosclerotic  vascular disease. ABDOMEN/PELVIS: No significant abnormal hypermetabolic activity in this region. Incidental CT findings: Aortoiliac atherosclerotic vascular disease. Diffuse urinary bladder wall thickening, cystitis is not excluded. Small bilateral scrotal hydroceles. SKELETON: Scattered hypermetabolic osseous lesions present and for the most part are relatively occult on the CT data, or with only very subtle lucency. Lesions are observed in the left C7 vertebral body and pedicle; in the left T8 vertebral body; in the left L3 pedicle; in the right ninth rib; in the left eighth rib in the left iliac crest; in the right iliac bone and  adjacent right sacrum; in the left sacrum; and in the right anterior and posterior acetabular walls. Index lesion in the sacrum along the SI joint has a maximum SUV of 12.0. Index lesion in the left L3 pedicle has a maximum SUV of 8.7. There is also considerable accentuated activity within along the known left proximal humeral fracture which has an irregular appearance. I suspect that this is probably a pathologic fracture given the degree of activity although healing response can sometimes simulated pathologic fracture. Maximum SUV along the left proximal humeral healing fracture site is 10.4. Incidental CT findings: The right ninth rib lesion is likely associated with a pathologic fracture degenerative endplate sclerosis at P7-9 and L4-5 with Schmorl's nodes. Suspected T8 compression fracture. IMPRESSION: 1. 2.4 cm left lower lobe cavitary mass is highly hypermetabolic with maximum SUV of 12.5, favoring malignancy especially in the context of other findings. This lesion is associated with adjacent left lower lobe airspace opacity and a new cavitary lesion which are less hypermetabolic and probably infectious. Hypermetabolic peribronchovascular nodule in the right middle lobe just below the minor fissure, likely metastatic. There is also a smaller lingular nodule which is faintly metabolic and probably metastatic. 2. Scattered hypermetabolic osseous lesions compatible with metastatic disease, involving the spine, ribs, and bony pelvis. In addition, I suspect that there is an underlying metastatic lesion along the left proximal humeral fracture which may have predisposed to pathologic fracture. There is a pathologic fracture the right ninth rib as well. 3. Although lung cancer primary in the left lower lobe is certainly a possibility, by report the patient has a history of melanoma, and metastatic melanoma is also a differential diagnostic consideration. 4. Other imaging findings of potential clinical significance:  Aortic Atherosclerosis (ICD10-I70.0). Coronary atherosclerosis. Diffuse urinary bladder wall thickening, cystitis not excluded. Small bilateral scrotal hydroceles. Electronically Signed   By: Van Clines M.D.   On: 04/04/2019 10:01   Dg Chest Port 1 View  Result Date: 03/28/2019 CLINICAL DATA:  Bronchoscopy with biopsy. EXAM: PORTABLE CHEST 1 VIEW COMPARISON:  CT 02/21/2019. FINDINGS: Mediastinum and hilar structures normal. Mild left base subsegmental atelectasis and or scarring. No focal infiltrate. No pleural effusion or pneumothorax. Cardiomegaly with normal pulmonary vascularity. Degenerative change thoracic spine. IMPRESSION: 1.  Cardiomegaly.  No pulmonary venous congestion. 2. Mild left base subsegmental atelectasis and or scarring. No focal infiltrate noted. No pneumothorax post bronchoscopy. Electronically Signed   By: Marcello Moores  Register   On: 03/28/2019 11:16   Dg C-arm Bronchoscopy  Result Date: 03/28/2019 C-ARM BRONCHOSCOPY: Fluoroscopy was utilized by the requesting physician.  No radiographic interpretation.    ASSESSMENT AND PLAN: This is a very pleasant 68 years old white male recently diagnosed with stage IV (T3, N0, M1c) non-small cell lung cancer, adenocarcinoma presented with large left lower lobe lung mass in addition to metastatic bone disease diagnosed in September 2020. I had a lengthy discussion with the patient today  about his current disease stage, prognosis and treatment options. I personally and independently reviewed the PET scan images and discussed the result and showed the images to the patient today. I recommended for the patient to have MRI of the brain to complete the staging work-up of his disease. I will also send a blood test to [facility] for molecular studies to check for the presence of any actionable mutations. For the painful bone disease, the patient will be seen later today by Dr. Sondra Come for evaluation and discussion of palliative radiotherapy to  this area especially the left arm. I will see him back for follow-up visit in 2 weeks for evaluation and more detailed discussion of his treatment options based on the molecular studies and staging work-up. He was advised to call immediately if he has any concerning symptoms in the interval. The patient voices understanding of current disease status and treatment options and is in agreement with the current care plan.  All questions were answered. The patient knows to call the clinic with any problems, questions or concerns. We can certainly see the patient much sooner if necessary.  I spent 20 minutes counseling the patient face to face. The total time spent in the appointment was 30 minutes.  Disclaimer: This note was dictated with voice recognition software. Similar sounding words can inadvertently be transcribed and may not be corrected upon review.

## 2019-04-05 NOTE — Progress Notes (Signed)
Oncology Nurse Navigator Documentation  Oncology Nurse Navigator Flowsheets 04/05/2019  Diagnosis Status Confirmed Diagnosis Complete  Navigator Follow Up Date: 04/10/2019  Navigator Follow Up Reason: Other/I spoke with patient at thoracic clinic today.  I gave and explain information on lung cancer and next steps.  I help coordinate appt for lab and him getting [facility].  He verbalized understanding of appt time and place.   Navigator Location [facility]  Referral Date to RadOnc/MedOnc -  Navigator Encounter Type Clinic/MDC  Telephone -  Abnormal Finding Date -  Confirmed Diagnosis Date 03/28/2019  Multidisiplinary Clinic Date 04/05/2019  Patient Visit Type MedOnc  Treatment Phase Pre-Tx/Tx Discussion  Barriers/Navigation Needs Coordination of Care;Education  Education Newly Diagnosed Cancer Education;Other  Interventions Coordination of Care;Education  Coordination of Care Appts;Other  Education Method Verbal  Acuity Level 3-Moderate Needs (3-4 Barriers Identified)  Time Spent with Patient 45

## 2019-04-06 ENCOUNTER — Encounter: Payer: Self-pay | Admitting: Internal Medicine

## 2019-04-06 ENCOUNTER — Telehealth: Payer: Self-pay | Admitting: Internal Medicine

## 2019-04-06 DIAGNOSIS — Z5111 Encounter for antineoplastic chemotherapy: Secondary | ICD-10-CM | POA: Insufficient documentation

## 2019-04-06 DIAGNOSIS — Z7189 Other specified counseling: Secondary | ICD-10-CM | POA: Insufficient documentation

## 2019-04-06 DIAGNOSIS — C3492 Malignant neoplasm of unspecified part of left bronchus or lung: Secondary | ICD-10-CM | POA: Insufficient documentation

## 2019-04-06 NOTE — Telephone Encounter (Signed)
Scheduled appt per 10/1 los - unable to reach pt . Left message with appt date and time

## 2019-04-08 NOTE — Progress Notes (Signed)
  Radiation Oncology         [phone] ________________________________  Name: Albert Perez MRN: [id number]  Date: 04/09/2019  DOB: 1950-09-21  SIMULATION AND TREATMENT PLANNING NOTE    ICD-10-CM   1. Adenocarcinoma of left lung, stage 4 (HCC)  C34.92     DIAGNOSIS:  Stage IV (T3, N0, M1c) non-small cell lung cancer, adenocarcinoma with left proximal humerus pathologic fracture  NARRATIVE:  The patient was brought to the [facility].  Identity was confirmed.  All relevant records and images related to the planned course of therapy were reviewed.  The patient freely provided informed written consent to proceed with treatment after reviewing the details related to the planned course of therapy. The consent form was witnessed and verified by the simulation staff.  Then, the patient was set-up in a stable reproducible  supine position for radiation therapy.  CT images were obtained.  Surface markings were placed.  The CT images were loaded into the planning software.  Then the target and avoidance structures were contoured.  Treatment planning then occurred.  The radiation prescription was entered and confirmed.  Then, I designed and supervised the construction of a total of 5 medically necessary complex treatment devices.  I have requested : Isodose Plan.  I have ordered:dose calc.  PLAN:  The patient will receive 30 Gy in 10 fractions.  -----------------------------------  Blair Promise, PhD, MD  This document serves as a record of services personally performed by Gery Pray, MD. It was created on his behalf by Wilburn Mylar, a trained medical scribe. The creation of this record is based on the scribe's personal observations and the provider's statements to them. This document has been checked and approved by the attending provider.

## 2019-04-09 ENCOUNTER — Telehealth: Payer: Self-pay | Admitting: Internal Medicine

## 2019-04-09 ENCOUNTER — Other Ambulatory Visit: Payer: Self-pay

## 2019-04-09 ENCOUNTER — Ambulatory Visit
Admission: RE | Admit: 2019-04-09 | Discharge: 2019-04-09 | Disposition: A | Discharge status: Home / Self Care | Payer: Medicare Other | Source: Ambulatory Visit | Attending: Radiation Oncology | Admitting: Radiation Oncology

## 2019-04-09 DIAGNOSIS — C3492 Malignant neoplasm of unspecified part of left bronchus or lung: Secondary | ICD-10-CM | POA: Diagnosis present

## 2019-04-09 DIAGNOSIS — Z510 Encounter for antineoplastic radiation therapy: Secondary | ICD-10-CM | POA: Diagnosis not present

## 2019-04-09 NOTE — Telephone Encounter (Signed)
LVM for patient in reference to upcoming MRI appointment gave address and arrival time, also let patient know I would leave a map for him at his next treatment appointment

## 2019-04-10 DIAGNOSIS — Z510 Encounter for antineoplastic radiation therapy: Secondary | ICD-10-CM | POA: Diagnosis not present

## 2019-04-11 ENCOUNTER — Other Ambulatory Visit: Payer: Self-pay | Admitting: Emergency Medicine

## 2019-04-11 ENCOUNTER — Ambulatory Visit
Admission: RE | Admit: 2019-04-11 | Discharge: 2019-04-11 | Disposition: A | Discharge status: Home / Self Care | Payer: Medicare Other | Source: Ambulatory Visit | Attending: Radiation Oncology | Admitting: Radiation Oncology

## 2019-04-11 ENCOUNTER — Telehealth: Payer: Self-pay | Admitting: *Deleted

## 2019-04-11 ENCOUNTER — Other Ambulatory Visit: Payer: Self-pay

## 2019-04-11 DIAGNOSIS — Z510 Encounter for antineoplastic radiation therapy: Secondary | ICD-10-CM | POA: Diagnosis not present

## 2019-04-11 DIAGNOSIS — C3492 Malignant neoplasm of unspecified part of left bronchus or lung: Secondary | ICD-10-CM

## 2019-04-11 NOTE — Progress Notes (Signed)
  Radiation Oncology         [phone] ________________________________  Name: Albert Perez MRN: [id number]  Date: 04/11/2019  DOB: 1951/01/06  Simulation Verification Note    ICD-10-CM   1. Adenocarcinoma of left lung, stage 4 (HCC)  C34.92     Status: outpatient  NARRATIVE: The patient was brought to the treatment unit and placed in the planned treatment position. The clinical setup was verified. Then port films were obtained and uploaded to the radiation oncology medical record software.  The treatment beams were carefully compared against the planned radiation fields. The position location and shape of the radiation fields was reviewed. They targeted volume of tissue appears to be appropriately covered by the radiation beams. Organs at risk appear to be excluded as planned.  Based on my personal review, I approved the simulation verification. The patient's treatment will proceed as planned.  -----------------------------------  Blair Promise, PhD, MD

## 2019-04-11 NOTE — Telephone Encounter (Signed)
Oncology Nurse Navigator Documentation  Oncology Nurse Navigator Flowsheets 04/11/2019  Diagnosis Status -  Navigator Follow Up Date: -  Navigator Follow Up Reason: -  Navigator Location [facility]  Referral Date to RadOnc/MedOnc -  Navigator Encounter Type Telephone/I followed up on Mr. Helmers scheduled and his MRI brain and follow up are set up.  He received his first radiation therapy today.  I called and spoke with patient.  He had questions about his cancer.  I clarified.  He was thankful for the follow up.    I did check to see if Mr. Humbert Morozov was completed but it is still being processed.   Telephone Outgoing Call  Abnormal Finding Date -  Confirmed Diagnosis Date -  Multidisiplinary Clinic Date -  Treatment Initiated Date 04/11/2019  Patient Visit Type -  Treatment Phase Treatment  Barriers/Navigation Needs Coordination of Care;Education  Education Other  Interventions -  Coordination of Care Other  Education Method Verbal  Acuity Level 2-Minimal Needs (1-2 Barriers Identified)  Time Spent with Patient 75

## 2019-04-12 ENCOUNTER — Other Ambulatory Visit: Payer: Self-pay

## 2019-04-12 ENCOUNTER — Ambulatory Visit
Admission: RE | Admit: 2019-04-12 | Discharge: 2019-04-12 | Disposition: A | Discharge status: Home / Self Care | Payer: Medicare Other | Source: Ambulatory Visit | Attending: Radiation Oncology | Admitting: Radiation Oncology

## 2019-04-12 DIAGNOSIS — Z510 Encounter for antineoplastic radiation therapy: Secondary | ICD-10-CM | POA: Diagnosis not present

## 2019-04-13 ENCOUNTER — Ambulatory Visit
Admission: RE | Admit: 2019-04-13 | Discharge: 2019-04-13 | Disposition: A | Discharge status: Home / Self Care | Payer: Medicare Other | Source: Ambulatory Visit | Attending: Radiation Oncology | Admitting: Radiation Oncology

## 2019-04-13 ENCOUNTER — Other Ambulatory Visit: Payer: Self-pay

## 2019-04-13 DIAGNOSIS — Z510 Encounter for antineoplastic radiation therapy: Secondary | ICD-10-CM | POA: Diagnosis not present

## 2019-04-16 ENCOUNTER — Other Ambulatory Visit: Payer: Self-pay

## 2019-04-16 ENCOUNTER — Ambulatory Visit
Admission: RE | Admit: 2019-04-16 | Discharge: 2019-04-16 | Disposition: A | Discharge status: Home / Self Care | Payer: Medicare Other | Source: Ambulatory Visit | Attending: Radiation Oncology | Admitting: Radiation Oncology

## 2019-04-16 ENCOUNTER — Ambulatory Visit: Payer: Medicare Other | Admitting: Radiation Oncology

## 2019-04-16 ENCOUNTER — Ambulatory Visit (HOSPITAL_COMMUNITY)
Admission: RE | Admit: 2019-04-16 | Discharge: 2019-04-16 | Disposition: A | Discharge status: Home / Self Care | Payer: Medicare Other | Source: Ambulatory Visit | Attending: Internal Medicine | Admitting: Internal Medicine

## 2019-04-16 DIAGNOSIS — C3490 Malignant neoplasm of unspecified part of unspecified bronchus or lung: Secondary | ICD-10-CM | POA: Insufficient documentation

## 2019-04-16 DIAGNOSIS — Z510 Encounter for antineoplastic radiation therapy: Secondary | ICD-10-CM | POA: Diagnosis not present

## 2019-04-16 MED ORDER — GADOBUTROL 1 MMOL/ML IV SOLN
8.0000 mL | Freq: Once | INTRAVENOUS | Status: AC | PRN
  Administered 2019-04-16: 10:00:00 8 mL via INTRAVENOUS

## 2019-04-17 ENCOUNTER — Ambulatory Visit
Admission: RE | Admit: 2019-04-17 | Discharge: 2019-04-17 | Disposition: A | Discharge status: Home / Self Care | Payer: Medicare Other | Source: Ambulatory Visit | Attending: Radiation Oncology | Admitting: Radiation Oncology

## 2019-04-17 ENCOUNTER — Other Ambulatory Visit: Payer: Self-pay

## 2019-04-17 DIAGNOSIS — Z510 Encounter for antineoplastic radiation therapy: Secondary | ICD-10-CM | POA: Diagnosis not present

## 2019-04-18 ENCOUNTER — Other Ambulatory Visit: Payer: Self-pay

## 2019-04-18 ENCOUNTER — Ambulatory Visit
Admission: RE | Admit: 2019-04-18 | Discharge: 2019-04-18 | Disposition: A | Discharge status: Home / Self Care | Payer: Medicare Other | Source: Ambulatory Visit | Attending: Radiation Oncology | Admitting: Radiation Oncology

## 2019-04-18 DIAGNOSIS — Z510 Encounter for antineoplastic radiation therapy: Secondary | ICD-10-CM | POA: Diagnosis not present

## 2019-04-19 ENCOUNTER — Inpatient Hospital Stay: Payer: Medicare Other

## 2019-04-19 ENCOUNTER — Inpatient Hospital Stay: Payer: Medicare Other | Admitting: Physician Assistant

## 2019-04-19 ENCOUNTER — Encounter: Payer: Self-pay | Admitting: Physician Assistant

## 2019-04-19 ENCOUNTER — Ambulatory Visit
Admission: RE | Admit: 2019-04-19 | Discharge: 2019-04-19 | Disposition: A | Discharge status: Home / Self Care | Payer: Medicare Other | Source: Ambulatory Visit | Attending: Radiation Oncology | Admitting: Radiation Oncology

## 2019-04-19 ENCOUNTER — Other Ambulatory Visit: Payer: Self-pay | Admitting: Internal Medicine

## 2019-04-19 ENCOUNTER — Other Ambulatory Visit: Payer: Self-pay

## 2019-04-19 VITALS — BP 151/70 | HR 61 | Temp 98.9°F | Resp 17 | Ht 67.5 in | Wt 184.6 lb

## 2019-04-19 DIAGNOSIS — C3432 Malignant neoplasm of lower lobe, left bronchus or lung: Secondary | ICD-10-CM | POA: Diagnosis not present

## 2019-04-19 DIAGNOSIS — I10 Essential (primary) hypertension: Secondary | ICD-10-CM

## 2019-04-19 DIAGNOSIS — C3492 Malignant neoplasm of unspecified part of left bronchus or lung: Secondary | ICD-10-CM | POA: Diagnosis not present

## 2019-04-19 DIAGNOSIS — M84522D Pathological fracture in neoplastic disease, left humerus, subsequent encounter for fracture with routine healing: Secondary | ICD-10-CM | POA: Diagnosis not present

## 2019-04-19 DIAGNOSIS — Z5111 Encounter for antineoplastic chemotherapy: Secondary | ICD-10-CM

## 2019-04-19 DIAGNOSIS — Z510 Encounter for antineoplastic radiation therapy: Secondary | ICD-10-CM | POA: Diagnosis not present

## 2019-04-19 DIAGNOSIS — Z7189 Other specified counseling: Secondary | ICD-10-CM | POA: Diagnosis not present

## 2019-04-19 LAB — CMP (CANCER CENTER ONLY)
ALT: 22 U/L (ref 0–44)
AST: 20 U/L (ref 15–41)
Albumin: 4.1 g/dL (ref 3.5–5.0)
Alkaline Phosphatase: 108 U/L (ref 38–126)
Anion gap: 10 (ref 5–15)
BUN: 20 mg/dL (ref 8–23)
CO2: 25 mmol/L (ref 22–32)
Calcium: 9.5 mg/dL (ref 8.9–10.3)
Chloride: 106 mmol/L (ref 98–111)
Creatinine: 1.1 mg/dL (ref 0.61–1.24)
GFR, Est AFR Am: >60 mL/min (ref >60)
GFR, Estimated: >60 mL/min (ref >60)
Glucose, Bld: 109 mg/dL — ABNORMAL HIGH (ref 70–99)
Potassium: 4.6 mmol/L (ref 3.5–5.1)
Sodium: 141 mmol/L (ref 135–145)
Total Bilirubin: 0.6 mg/dL (ref 0.3–1.2)
Total Protein: 7 g/dL (ref 6.5–8.1)

## 2019-04-19 LAB — CBC WITH DIFFERENTIAL (CANCER CENTER ONLY)
Abs Immature Granulocytes: 0.03 10*3/uL (ref 0.00–0.07)
Basophils Absolute: 0 10*3/uL (ref 0.0–0.1)
Basophils Relative: 0 %
Eosinophils Absolute: 0.1 10*3/uL (ref 0.0–0.5)
Eosinophils Relative: 2 %
HCT: 39.5 % (ref 39.0–52.0)
Hemoglobin: 13.1 g/dL (ref 13.0–17.0)
Immature Granulocytes: 1 %
Lymphocytes Relative: 27 %
Lymphs Abs: 1.7 10*3/uL (ref 0.7–4.0)
MCH: 32.3 pg (ref 26.0–34.0)
MCHC: 33.2 g/dL (ref 30.0–36.0)
MCV: 97.5 fL (ref 80.0–100.0)
Monocytes Absolute: 0.7 10*3/uL (ref 0.1–1.0)
Monocytes Relative: 11 %
Neutro Abs: 3.8 10*3/uL (ref 1.7–7.7)
Neutrophils Relative %: 59 %
Platelet Count: 241 10*3/uL (ref 150–400)
RBC: 4.05 MIL/uL — ABNORMAL LOW (ref 4.22–5.81)
RDW: 13.7 % (ref 11.5–15.5)
WBC Count: 6.3 10*3/uL (ref 4.0–10.5)
nRBC: 0 % (ref 0.0–0.2)

## 2019-04-19 MED ORDER — OSIMERTINIB MESYLATE 80 MG PO TABS: 80.0000 mg | ORAL_TABLET | Freq: Every day | ORAL | 2 refills | Status: DC

## 2019-04-19 MED ORDER — OXYCODONE-ACETAMINOPHEN 5-325 MG PO TABS: 1.0000 | ORAL_TABLET | Freq: Three times a day (TID) | ORAL | 0 refills | Status: DC | PRN

## 2019-04-19 MED ORDER — PROCHLORPERAZINE MALEATE 10 MG PO TABS: 10.0000 mg | ORAL_TABLET | Freq: Four times a day (QID) | ORAL | 1 refills | Status: DC | PRN

## 2019-04-19 NOTE — Progress Notes (Signed)
START ON PATHWAY REGIMEN - Non-Small Cell Lung     A cycle is every 28 days:     Osimertinib   **Always confirm dose/schedule in your pharmacy ordering system**  Patient Characteristics: Stage IV Metastatic, Nonsquamous, Molecular Targeted Therapy, Initial Molecular Targeted Therapy, EGFR Sensitizing Mutation - Common AJCC T Category: T3 Current Disease Status: Distant Metastases AJCC N Category: N0 AJCC M Category: M1c AJCC 8 Stage Grouping: IVB Histology: Nonsquamous Cell ROS1 Rearrangement Status: Negative T790M Mutation Status: Not Applicable - EGFR Mutation 1st Line of Therapy Other Mutations/Biomarkers: No Other Actionable Mutations Chemotherapy/Immunotherapy LOT: Not Appropriate Molecular Targeted Therapy: Initial Molecular Targeted Therapy MET Exon 14 Mutation Status: Negative RET Gene Fusion Status: Negative EGFR Mutation Status: Sensitizing Common NTRK Gene Fusion Status: Negative PD-L1 Expression Status: Quantity Not Sufficient ALK Rearrangement Status: Negative BRAF V600E Mutation Status: Negative Intent of Therapy: Non-Curative / Palliative Intent, Discussed with Patient

## 2019-04-19 NOTE — Progress Notes (Signed)
[place] OFFICE PROGRESS NOTE  Aura Dials, MD [place] [address]  DIAGNOSIS: Stage IV (T3, N0, M1c) non-small cell lung cancer, adenocarcinoma presented with cavitary left lower lobe lung mass in addition to other pulmonary nodules and scattered hypermetabolic osseous metastatic disease involving the spine, ribs and bony pelvis as well as left proximal humeral pathologic fracture.   Molecular Biomarkers performed by [facility] DETECTED ALTERATION(S) / [name]  [id number]  Afatinib, Dacomitinib, Erlotinib, Gefitinib, Osimertinib, Ramucirumab Neratinib  FGFR1Amplification  Erdafitinib, Lenvatinib, Nintedanib, Pazopanib, Pemigatinib, Ponatinib  [id number] None  PRIOR THERAPY: None  CURRENT THERAPY:  1) Palliative radiotherapy to the the left humerus under the care of Dr. Sondra Come. Last treatment expected on 04/24/2019 2) Oral chemotherapy with Tagrisso 80 p.o. daily. Expected to start within the next week.   INTERVAL HISTORY: Albert Perez 68 y.o. male returns to the clinic for a follow-up visit.  The patient is feeling well today without any concerning complaints. He denies any fever, chills, night sweats, or weight loss.  He denies any chest pain or shortness of breath. The patient still has a few episodes of hemoptysis. He states that about 1/2 of the time he coughs, that he has blood clots about the size of a finger nail. He denies any nausea, vomiting, diarrhea, or constipation.  He denies any headache or visual changes. He recently had a brain MRI performed which was negative for any evidence of metastatic disease to the brain. The patient was recently diagnosed with stage IV non-small cell lung cancer, adenocarcinoma.  The patient had molecular studies performed by [facility] to assess for any actionable mutations, for which an oral chemotherapy agent can be used.  In the meantime, the patient has been receiving palliative  radiotherapy to the left humerus under the care of Dr. Sondra Come. The patient has a pathological fracture in this region. The patient's last radiation treatment is expected to be on 04/24/2019. He still is endorsing pain in his left arm, particularly at nighttime. The patient tries to take Tylenol or Advil PM for his pain, but still he is waking up after 1.5 hours secondary to pain. The patient is here today for evaluation and to review the results of his molecular studies in order to determine his treatment.   MEDICAL HISTORY: Past Medical History:  Diagnosis Date  . A-fib [facility])    PMH: 2000-2001  . Arthritis   . Cancer ([place])    melanoma 2018  . DVT (deep venous thrombosis) ([facility])    Left brachial vein DVT (01/30/19, following fracture)  . ETOH abuse   . GERD (gastroesophageal reflux disease)   . Sleep apnea    do not wear CPAP  . Stroke [facility])    " mini"  . Vertigo   . Wears glasses     ALLERGIES:  has No Known Allergies.  MEDICATIONS:  Current Outpatient Medications  Medication Sig Dispense Refill  . acetaminophen (TYLENOL) 650 MG CR tablet Take 1,300 mg by mouth every 8 (eight) hours as needed for pain.    Marland Kitchen aspirin 81 MG tablet Take 1 tablet (81 mg total) by mouth daily.    . Cyanocobalamin (B-12 PO) Take 1,000 mg by mouth daily.     . Omega-3 Fatty Acids (OMEGA-3 PO) Take 1,200 mg by mouth daily.     . rivaroxaban (XARELTO) 20 MG TABS tablet Take 1 tablet (20 mg total) by mouth daily with supper. Okay to restart this medication  on Saturday, 03/31/2019. 30 tablet   . rosuvastatin (CRESTOR) 5 MG tablet Take 5 mg by mouth daily.  2  . osimertinib mesylate (TAGRISSO) 80 MG tablet Take 1 tablet (80 mg total) by mouth daily. 30 tablet 2  . oxyCODONE-acetaminophen (PERCOCET/ROXICET) 5-325 MG tablet Take 1 tablet by mouth every 8 (eight) hours as needed for severe pain. 10 tablet 0  . prochlorperazine (COMPAZINE) 10 MG tablet Take 1 tablet (10 mg total) by mouth every 6 (six) hours as  needed for nausea or vomiting. 30 tablet 1   No current facility-administered medications for this visit.     SURGICAL HISTORY:  Past Surgical History:  Procedure Laterality Date  . ANKLE FRACTURE SURGERY Right 2003  . COLONOSCOPY  2018  . ELBOW SURGERY Left 1964  . melanoma removal  04/2017   Lt shoulder  . RETINAL DETACHMENT SURGERY Right 1971  . TONSILLECTOMY    . VIDEO BRONCHOSCOPY WITH ENDOBRONCHIAL NAVIGATION N/A 03/28/2019   Procedure: VIDEO BRONCHOSCOPY WITH ENDOBRONCHIAL NAVIGATION and Biopsies;  Surgeon: Collene Gobble, MD;  Location: [facility] OR;  Service: Thoracic;  Laterality: N/A;    REVIEW OF SYSTEMS:   Review of Systems  Constitutional: Negative for appetite change, chills, fatigue, fever and unexpected weight change.  HENT:   Negative for mouth sores, nosebleeds, sore throat and trouble swallowing.   Eyes: Negative for eye problems and icterus.  Respiratory: Positive for hemoptysis. Negative for shortness of breath and wheezing.   Cardiovascular: Negative for chest pain and leg swelling.  Gastrointestinal: Negative for abdominal pain, constipation, diarrhea, nausea and vomiting.  Genitourinary: Negative for bladder incontinence, difficulty urinating, dysuria, frequency and hematuria.   Musculoskeletal: Positive for left humerus pain. Negative for back pain, gait problem, neck pain and neck stiffness.  Skin: Negative for itching and rash.  Neurological: Negative for dizziness, extremity weakness, gait problem, headaches, light-headedness and seizures.  Hematological: Negative for adenopathy. Does not bruise/bleed easily.  Psychiatric/Behavioral: Negative for confusion, depression and sleep disturbance. The patient is not nervous/anxious.     PHYSICAL EXAMINATION:  Blood pressure (!) 151/70, pulse 61, temperature 98.9 F (37.2 C), temperature source Temporal, resp. rate 17, height 5' 7.5" (1.715 m), weight 184 lb 9.6 oz (83.7 kg), SpO2 100 %.  ECOG PERFORMANCE STATUS:  1 - Symptomatic but completely ambulatory  Physical Exam  Constitutional: Oriented to person, place, and time and well-developed, well-nourished, and in no distress. HENT:  Head: Normocephalic and atraumatic.  Mouth/Throat: Oropharynx is clear and moist. No oropharyngeal exudate.  Eyes: Conjunctivae are normal. Right eye exhibits no discharge. Left eye exhibits no discharge. No scleral icterus.  Neck: Normal range of motion. Neck supple.  Cardiovascular: Normal rate, regular rhythm, normal heart sounds and intact distal pulses.   Pulmonary/Chest: Effort normal and breath sounds normal. No respiratory distress. No wheezes. No rales.  Abdominal: Soft. Bowel sounds are normal. Exhibits no distension and no mass. There is no tenderness.  Musculoskeletal: Normal range of motion. Exhibits no edema.  Lymphadenopathy:    No cervical adenopathy.  Neurological: Alert and oriented to person, place, and time. Exhibits normal muscle tone. Gait normal. Coordination normal.  Skin: Skin is warm and dry. No rash noted. Not diaphoretic. No erythema. No pallor.  Psychiatric: Mood, memory and judgment normal.  Vitals reviewed.  LABORATORY DATA: Lab Results  Component Value Date   WBC 6.3 04/19/2019   HGB 13.1 04/19/2019   HCT 39.5 04/19/2019   MCV 97.5 04/19/2019   PLT 241 04/19/2019  Chemistry      Component Value Date/Time   NA 141 04/19/2019 1404   K 4.6 04/19/2019 1404   CL 106 04/19/2019 1404   CO2 25 04/19/2019 1404   BUN 20 04/19/2019 1404   CREATININE 1.10 04/19/2019 1404      Component Value Date/Time   CALCIUM 9.5 04/19/2019 1404   ALKPHOS 108 04/19/2019 1404   AST 20 04/19/2019 1404   ALT 22 04/19/2019 1404   BILITOT 0.6 04/19/2019 1404       RADIOGRAPHIC STUDIES:  Mr Jeri Cos MG Contrast  Result Date: 04/16/2019 CLINICAL DATA:  Stage IV non-small cell lung cancer left lower lobe. EXAM: MRI HEAD WITHOUT AND WITH CONTRAST TECHNIQUE: Multiplanar, multiecho pulse  sequences of the brain and surrounding structures were obtained without and with intravenous contrast. CONTRAST:  33m GADAVIST GADOBUTROL 1 MMOL/ML IV SOLN COMPARISON:  09/02/2017 FINDINGS: Brain: Diffusion imaging does not show any acute or subacute infarction or other cause of restricted diffusion. The brainstem and cerebellum are normal. 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 Mild chronic small-vessel change of the cerebral hemispheric white matter. No cortical or large vessel territory infarction. No sign of primary or metastatic mass lesion, hemorrhage, hydrocephalus or extra-axial collection. Vascular: Major vessels at the base of the brain show flow. Skull and upper cervical spine: Negative Sinuses/Orbits: Sinuses are clear. Chronic deformity of the right globe. Other: None IMPRESSION: No evidence of metastatic disease. Chronic small-vessel ischemic changes as outlined above. Electronically Signed   By: MNelson ChimesM.D.   On: 04/16/2019 11:05   Nm Pet Image Initial (pi) Skull Base To Thigh  Result Date: 04/04/2019 CLINICAL DATA:  Initial treatment strategy for pulmonary nodules. EXAM: NUCLEAR MEDICINE PET SKULL BASE TO THIGH TECHNIQUE: 9.0 mCi F-18 FDG was injected intravenously. Full-ring PET imaging was performed from the skull base to thigh after the radiotracer. CT data was obtained and used for attenuation correction and anatomic localization. Fasting blood glucose: 106 mg/dl COMPARISON:  Chest CT from 02/21/2019 FINDINGS: Mediastinal blood pool activity: SUV max 2.4 Liver activity: SUV max 3.3 NECK: No significant abnormal hypermetabolic activity in this region. Incidental CT findings: Bilateral common carotid atherosclerotic calcification. Right scleral band. CHEST: Left lower lobe cavitary mass just posterior to the major fissure, with surrounding airspace opacity. The hypermetabolic region associated with this opacity measures 2.4 by 2.2 cm and has a maximum SUV of 12.5. Central cavitation  noted. Below this there is airspace opacity which is not overtly hypermetabolic, leading down to a new cavitary lesion measuring 2.3 by 1.8 cm on image 40/8 which likewise is not particularly hypermetabolic, maximum SUV 2.0. Primarily along the right middle lobe side of the minor fissure, a nodule measuring approximately 2.1 by 1.1 cm on image 36/8 has a maximum SUV of 4.8. A lingular nodule measuring 0.8 by 0.6 cm on image 36/8 has a maximum SUV of 1.5. Medially in the right lower lobe on image 56/8, a 0.6 by 0.5 cm right lower lobe nodule is not appreciably hypermetabolic, but is below sensitive PET-CT size thresholds. The small mediastinal lymph nodes are not appreciably hypermetabolic. Incidental CT findings: Coronary, aortic arch, and branch vessel atherosclerotic vascular disease. ABDOMEN/PELVIS: No significant abnormal hypermetabolic activity in this region. Incidental CT findings: Aortoiliac atherosclerotic vascular disease. Diffuse urinary bladder wall thickening, cystitis is not excluded. Small bilateral scrotal hydroceles. SKELETON: Scattered hypermetabolic osseous lesions present and for the most part are relatively occult on the CT data, or with only very subtle lucency. Lesions  are observed in the left C7 vertebral body and pedicle; in the left T8 vertebral body; in the left L3 pedicle; in the right ninth rib; in the left eighth rib in the left iliac crest; in the right iliac bone and adjacent right sacrum; in the left sacrum; and in the right anterior and posterior acetabular walls. Index lesion in the sacrum along the SI joint has a maximum SUV of 12.0. Index lesion in the left L3 pedicle has a maximum SUV of 8.7. There is also considerable accentuated activity within along the known left proximal humeral fracture which has an irregular appearance. I suspect that this is probably a pathologic fracture given the degree of activity although healing response can sometimes simulated pathologic fracture.  Maximum SUV along the left proximal humeral healing fracture site is 10.4. Incidental CT findings: The right ninth rib lesion is likely associated with a pathologic fracture degenerative endplate sclerosis at Q3-7 and L4-5 with Schmorl's nodes. Suspected T8 compression fracture. IMPRESSION: 1. 2.4 cm left lower lobe cavitary mass is highly hypermetabolic with maximum SUV of 12.5, favoring malignancy especially in the context of other findings. This lesion is associated with adjacent left lower lobe airspace opacity and a new cavitary lesion which are less hypermetabolic and probably infectious. Hypermetabolic peribronchovascular nodule in the right middle lobe just below the minor fissure, likely metastatic. There is also a smaller lingular nodule which is faintly metabolic and probably metastatic. 2. Scattered hypermetabolic osseous lesions compatible with metastatic disease, involving the spine, ribs, and bony pelvis. In addition, I suspect that there is an underlying metastatic lesion along the left proximal humeral fracture which may have predisposed to pathologic fracture. There is a pathologic fracture the right ninth rib as well. 3. Although lung cancer primary in the left lower lobe is certainly a possibility, by report the patient has a history of melanoma, and metastatic melanoma is also a differential diagnostic consideration. 4. Other imaging findings of potential clinical significance: Aortic Atherosclerosis (ICD10-I70.0). Coronary atherosclerosis. Diffuse urinary bladder wall thickening, cystitis not excluded. Small bilateral scrotal hydroceles. Electronically Signed   By: Van Clines M.D.   On: 04/04/2019 10:01   Dg Chest Port 1 View  Result Date: 03/28/2019 CLINICAL DATA:  Bronchoscopy with biopsy. EXAM: PORTABLE CHEST 1 VIEW COMPARISON:  CT 02/21/2019. FINDINGS: Mediastinum and hilar structures normal. Mild left base subsegmental atelectasis and or scarring. No focal infiltrate. No  pleural effusion or pneumothorax. Cardiomegaly with normal pulmonary vascularity. Degenerative change thoracic spine. IMPRESSION: 1.  Cardiomegaly.  No pulmonary venous congestion. 2. Mild left base subsegmental atelectasis and or scarring. No focal infiltrate noted. No pneumothorax post bronchoscopy. Electronically Signed   By: Marcello Moores  Register   On: 03/28/2019 11:16   Dg C-arm Bronchoscopy  Result Date: 03/28/2019 C-ARM BRONCHOSCOPY: Fluoroscopy was utilized by the requesting physician.  No radiographic interpretation.     ASSESSMENT/PLAN:  This is a very pleasant 68 year old Caucasian male recently diagnosed with stage IV non-small cell lung cancer, adenocarcinoma.  He presented with a large left lower lobe lung mass with metastatic disease to the bone in the spine, ribs, pelvis, and a pathological fracture of the proximal left humerus.  He was diagnosed in September 2020.    The patient was seen with Dr. Julien Nordmann today.  Dr. Julien Nordmann discussed the results of the patient's molecular studies performed by [facility] with him today. 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   Dr. Julien Nordmann had a lengthy discussion today with the  patient about his current condition and treatment options.  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 Julien Nordmann recommends that the patient be treated with targeted oral chemotherapy with Tagrisso 80 mg p.o. daily. The patient is interested in this option and is expected to start treatment in the next few days.  The adverse side effects of treatment were discussed including but not limited to diarrhea, cardiac conduction abnormalities, skin rash, nausea, vomiting, liver dysfunction, and fatigue.  I have sent a prescription for Tagrisso 80 mg p.o. daily to the the [facility].   We will obtain a baseline EKG on the patient while in the clinic today.   We will see the patient back for a follow up visit in 3 weeks for evaluation,  repeat blood work, and to manage any adverse side effects of treatment.   I have sent a prescription for a few tablets of percocet to the patient's pharmacy to manage his cancer related pain secondary to his fracture. I have also send a prescription for compazine 10 mg p.o. every 6 hours as needed for nausea.   The patient expressed his wishes to for body donation upon his passing. He is requesting that his body be donated for students in anatomy/cadaver lab. The patient was provided information regarding body donation and various programs.   The patient was advised to call immediately if he has any concerning symptoms in the interval. The patient voices understanding of current disease status and treatment options and is in agreement with the current care plan. All questions were answered. The patient knows to call the clinic with any problems, questions or concerns. We can certainly see the patient much sooner if necessary   Orders Placed This Encounter  Procedures  . CBC with Differential (Cancer Center Only)    Standing Status:   Future    Standing Expiration Date:   04/18/2020  . CMP ([place] only)    Standing Status:   Future    Standing Expiration Date:   04/18/2020  . EKG 12-Lead    Standing Status:   Standing    Number of Occurrences:   1    Order Specific Question:   Reason for Exam    Answer:   Baseline EKG before starting chemotherapy      L , PA-C 04/19/19  ADDENDUM: Hematology/Oncology Attending: I had a face-to-face encounter with the patient today.  I recommended his care plan.  This is a very pleasant 68 years old white male who was recently diagnosed with metastatic non-small cell lung cancer, adenocarcinoma presented with large left lower lobe lung mass in addition to metastatic disease to the bones in the spine drip pelvis as well as left arm.  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 The patient is  currently undergoing palliative radiotherapy to the left arm lesion under the care of Dr. Sondra Come. I had a lengthy discussion with the patient today about his current condition and treatment options. With the detection of EGFR mutation with deletion in exon 19, I discussed with radiation treatment with target therapy with EGFR tyrosine kinase inhibitor with Tagrisso 80 mg p.o. daily. I discussed with the patient the adverse effect of this treatment including but not limited to skin rash, diarrhea, interstitial lung disease, QT prolongation as well as cardiac dysfunction. We will arrange for the patient to have a EKG before starting the treatment. He will come back for follow-up  visit in 3 weeks for evaluation with repeat blood work for close monitoring of his condition and management of any adverse effect of his treatment. The patient was advised to call immediately if he has any concerning symptoms in the interval.  Disclaimer: This note was dictated with voice recognition software. Similar sounding words can inadvertently be transcribed and may be missed upon review. Eilleen Kempf, MD 04/19/19

## 2019-04-19 NOTE — Patient Instructions (Addendum)
[email or web address] -[employer] (Physician Assistant Studies or Physical Therapy).  [email or web address] For more information, to talk directly with our staff, or to visit our campus, please contact the [employer] Program by phone at [phone]) or by email at [email or web address]  Osimertinib oral tablets What is this medicine? Osimertinib (OH sim ER ti nib) is a medicine that targets proteins in cancer cells and stops the cancer cells from growing. It is used to treat non-small cell lung cancer. This medicine may be used for other purposes; ask your health care provider or pharmacist if you have questions. COMMON BRAND NAME(S): Tagrisso What should I tell my health care provider before I take this medicine? They need to know if you have any of these conditions:  heart disease  history of irregular heartbeat  history of low levels of calcium, magnesium, or potassium in the blood  lung or breathing disease, like asthma  QT prolongation  scarring or thickening of the lungs  an unusual or allergic reaction to osimertinib, other medicines, foods, dyes, or preservatives  pregnant or trying to get pregnant  breast-feeding How should I use this medicine? Take osimertinib tablets by mouth with a glass of water. You can take it with or without food. Follow the directions on the prescription label. Take your medicine at regular intervals. Do not take it more often than directed. Do not stop taking except on your doctor's advice. If swallowing is difficult, you can take the tablet by placing your dose in a container with 2 ounces of cool water only. Stir until the tablet is in small pieces. The tablet will not completely dissolve. Do not crush or chew. Drink the water and the tablet pieces right away. Then add 4 to 8 ounces of water to the same container and drink to make sure you take your full dose. Talk to your  pediatrician regarding the use of this medicine in children. Special care may be needed. Overdosage: If you think you have taken too much of this medicine contact a poison control center or emergency room at once. NOTE: This medicine is only for you. Do not share this medicine with others. What if I miss a dose? If you miss a dose, do not make up for the missed dose. Take your next dose at your regular time. Do not take extra or double doses. What may interact with this medicine? Do not take this medicine with any of the following medications:  cisapride  dronedarone  penicillamine  pimozide  thioridazine This medicine may interact with the following medications:  certain medicines for seizures like carbamazepine, phenobarbital, phenytoin  other medicines that prolong the QT interval (cause an abnormal heart rhythm) like dofetilide, ziprasidone  rifampin  rosuvastatin  St. John's Wort  sulfasalazine  topotecan This list may not describe all possible interactions. Give your health care provider a list of all the medicines, herbs, non-prescription drugs, or dietary supplements you use. Also tell them if you smoke, drink alcohol, or use illegal drugs. Some items may interact with your medicine. What should I watch for while using this medicine? Visit your doctor for regular check ups. Report any side effects. Continue your course of treatment unless your doctor tells you to stop. You will need blood work done while you are taking this medicine. Do not become pregnant while taking this medicine or for 6 weeks after the last dose. Males with male partners of reproductive potential should use effective contraception for 4 months after the last dose.  Women should inform their doctor if they wish to become pregnant or think they might be pregnant. There is a potential for serious side effects to an unborn child. This medicine may interfere with the ability to have a child for both men  and women. You should talk with your doctor or health care professional if you are concerned about your fertility. Talk to your health care professional or pharmacist for more information. Do not breast-feed an infant while taking this medicine or for 2 weeks after the last dose. This drug may make you feel generally unwell. This is not uncommon, as chemotherapy can affect healthy cells as well as cancer cells. Report any side effects. Continue your course of treatment even though you feel ill unless your doctor tells you to stop. What side effects may I notice from receiving this medicine? Side effects that you should report to your doctor or health care professional as soon as possible:  allergic reactions like skin rash, itching or hives, swelling of the face, lips, or tongue  red spots on the skin  redness, blistering, peeling, or loosening of the skin, including inside the mouth  signs and symptoms of a dangerous change in heartbeat or heart rhythm like chest pain; dizziness; fast or irregular heartbeat; palpitations; feeling faint or lightheaded, falls; breathing problems  signs and symptoms of increased potassium like muscle weakness; chest pain; or fast, irregular heartbeat  signs and symptoms of low potassium like muscle cramps or muscle pain; chest pain; dizziness; feeling faint or lightheaded, falls; palpitations; breathing problems; or fast, irregular heartbeat  swelling of the legs or ankles  unusually weak or tired Side effects that usually do not require medical attention (report to your doctor or health care professional if they continue or are bothersome):  diarrhea  dry skin  nail changes This list may not describe all possible side effects. Call your doctor for medical advice about side effects. You may report side effects to FDA at 1-800-FDA-1088. Where should I keep my medicine? Keep out of the reach of children. Store at room temperature between 20 and 25 degrees C  (68 and 77 degrees F). Throw away any unused medicine after the expiration date. NOTE: This sheet is a summary. It may not cover all possible information. If you have questions about this medicine, talk to your doctor, pharmacist, or health care provider.  2020 Elsevier/Gold Standard (2018-06-26 18:27:35)

## 2019-04-20 ENCOUNTER — Telehealth: Payer: Self-pay | Admitting: Pharmacist

## 2019-04-20 ENCOUNTER — Other Ambulatory Visit: Payer: Self-pay

## 2019-04-20 ENCOUNTER — Ambulatory Visit
Admission: RE | Admit: 2019-04-20 | Discharge: 2019-04-20 | Disposition: A | Discharge status: Home / Self Care | Payer: Medicare Other | Source: Ambulatory Visit | Attending: Radiation Oncology | Admitting: Radiation Oncology

## 2019-04-20 ENCOUNTER — Telehealth: Payer: Self-pay | Admitting: Internal Medicine

## 2019-04-20 ENCOUNTER — Telehealth: Payer: Self-pay

## 2019-04-20 DIAGNOSIS — Z510 Encounter for antineoplastic radiation therapy: Secondary | ICD-10-CM | POA: Diagnosis not present

## 2019-04-20 LAB — GUARDANT 360

## 2019-04-20 NOTE — Telephone Encounter (Signed)
Oral Oncology Patient Advocate Encounter  Received notification from [employer] that prior authorization for Tagrisso is required.  PA submitted on CoverMyMeds Key [id number] Status is pending  Oral Oncology Clinic will continue to follow.  [place] Patient [place] Phone [phone] Fax [phone] 04/20/2019    3:33 PM

## 2019-04-20 NOTE — Telephone Encounter (Addendum)
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  Labs from 04/18/49 assessed, OK for treatment initiation. 04/19/19 EKG shows QTc at 432 msec, safe for Tagrisso initiation  Current medication list in Epic reviewed, no clinically significant DDIs with osimertinib identified.  Prescription has been e-scribed to the [facility] for benefits analysis and approval on 04/19/19 by MD.  Oral Oncology Clinic will continue to follow for insurance authorization, copayment issues, initial counseling and start date.  Johny Drilling, PharmD, BCPS, BCOP  04/20/2019 12:28 PM Oral Oncology Clinic [phone]

## 2019-04-20 NOTE — Telephone Encounter (Signed)
Confirmed with patient lab/fu 11/5.

## 2019-04-23 ENCOUNTER — Ambulatory Visit
Admission: RE | Admit: 2019-04-23 | Discharge: 2019-04-23 | Disposition: A | Discharge status: Home / Self Care | Payer: Medicare Other | Source: Ambulatory Visit | Attending: Radiation Oncology | Admitting: Radiation Oncology

## 2019-04-23 ENCOUNTER — Other Ambulatory Visit: Payer: Self-pay

## 2019-04-23 ENCOUNTER — Telehealth: Payer: Self-pay

## 2019-04-23 DIAGNOSIS — Z510 Encounter for antineoplastic radiation therapy: Secondary | ICD-10-CM | POA: Diagnosis not present

## 2019-04-23 NOTE — Telephone Encounter (Signed)
Oral Oncology Patient Advocate Encounter  Met patient in the lobby to complete application for [employer]ME in an effort to reduce patient's out of pocket expense for Tagrisso to $0.    Application completed and faxed to [phone].   AZ&ME patient assistance phone number for follow up is [phone].   This encounter will be updated until final determination.   [place] Patient [place] Phone [phone] Fax [phone] 04/23/2019    11:28 AM

## 2019-04-23 NOTE — Telephone Encounter (Signed)
Oral Oncology Patient Advocate Encounter  Prior Authorization for Albert Perez has been approved.    PA# [id number] Effective dates: 04/20/19 through 07/04/20  Oral Oncology Clinic will continue to follow.   [place] Patient [place] Phone [phone] Fax [phone] 04/23/2019    11:26 AM

## 2019-04-24 ENCOUNTER — Ambulatory Visit: Payer: Medicare Other

## 2019-04-24 ENCOUNTER — Encounter: Payer: Self-pay | Admitting: Radiation Oncology

## 2019-04-24 ENCOUNTER — Ambulatory Visit
Admission: RE | Admit: 2019-04-24 | Discharge: 2019-04-24 | Disposition: A | Discharge status: Home / Self Care | Payer: Medicare Other | Source: Ambulatory Visit | Attending: Radiation Oncology | Admitting: Radiation Oncology

## 2019-04-24 ENCOUNTER — Other Ambulatory Visit: Payer: Self-pay

## 2019-04-24 DIAGNOSIS — Z510 Encounter for antineoplastic radiation therapy: Secondary | ICD-10-CM | POA: Diagnosis not present

## 2019-04-24 NOTE — Telephone Encounter (Signed)
Oral Oncology Patient Advocate Encounter  Received notification from AZ[employer]ME Patient Assistance program that patient has been successfully enrolled into their program to receive Tagrisso from the manufacturer at $0 out of pocket until 07/05/19.    I called and left a voicemail for the patient.    Oral Oncology Clinic will continue to follow.  [place] Patient [place] Phone [phone] Fax [phone] 04/24/2019    2:23 PM

## 2019-04-25 ENCOUNTER — Ambulatory Visit: Payer: Medicare Other

## 2019-04-25 ENCOUNTER — Telehealth: Payer: Self-pay | Admitting: Primary Care

## 2019-04-25 NOTE — Telephone Encounter (Signed)
Pt needs to self quarantine between covid test and PFT  LMTCB

## 2019-04-25 NOTE — Telephone Encounter (Signed)
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   Counseled patient on administration, dosing, side effects, monitoring, drug-food interactions, safe handling, storage, and disposal.  Patient will take Tagrisso 80 tablets, 1 tablet by mouth once daily, without regard to food.  Tagrisso start date: TBD, pending medication acquisition Patient has been approved to receive Tagrisso at $0 out of pocket charge through a manufacturer assistance program with [employer] He has not yet received his 1st shipment  Adverse effects include but are not limited to: diarrhea, mouth sores, decreased appetitie, fatigue, dry skin, rash, nail changes, altered cardiac conduction, and decreased blood counts or electrolytes.  Patient will obtain anti diarrheal and alert the office of 4 or more loose stools above baseline.   Reviewed with patient importance of keeping a medication schedule and plan for any missed doses.  Medication reconciliation performed and medication/allergy list updated.  Insurance authorization for Newman Nip has been obtained. Test claim at the pharmacy revealed copayment $2900 for 1st fill of Tagrisso. This is not affordable to the patient. There are no foundation copayment grants available for patient's diagnosis. Patient has been successfully enrolled to receive Tagrisso through [facility] manufacturer assistance program as above.  Patient instructed to call patient assistance pharmacy 10 days before he needs a new bottle of Tagrisso each month.  All questions answered.  Mr. Beer voiced understanding and appreciation.   Patient knows to call the office with questions or concerns.  Johny Drilling, PharmD, BCPS, BCOP  04/25/2019   9:27 AM Oral Oncology  Clinic [phone]

## 2019-04-26 ENCOUNTER — Ambulatory Visit: Payer: Medicare Other

## 2019-04-26 NOTE — Telephone Encounter (Signed)
Pt is returning call - please call CB# [phone]

## 2019-04-26 NOTE — Telephone Encounter (Signed)
Attempted to call patient, left message for patient to call back.

## 2019-04-26 NOTE — Telephone Encounter (Signed)
Called and spoke to pt. Informed him he needs to self isolate if he must go out to practice safe social distancing, good frequent hand hygiene, and wear a mask. Pt verbalized understanding and denied any further questions or concerns at this time.

## 2019-04-27 ENCOUNTER — Ambulatory Visit: Payer: Medicare Other

## 2019-04-27 NOTE — Telephone Encounter (Signed)
Oral Oncology Patient Advocate Encounter  Confirmed with the patient that Albert Perez was shipped by AZ&ME on 10/21 to be delivered on 10/22  [place] Patient [place] Phone [phone] Fax [phone] 04/27/2019   8:23 AM

## 2019-04-30 ENCOUNTER — Other Ambulatory Visit (HOSPITAL_COMMUNITY)
Admission: RE | Admit: 2019-04-30 | Discharge: 2019-04-30 | Disposition: A | Discharge status: Home / Self Care | Payer: Medicare Other | Source: Ambulatory Visit | Attending: Emergency Medicine | Admitting: Emergency Medicine

## 2019-04-30 DIAGNOSIS — Z01812 Encounter for preprocedural laboratory examination: Secondary | ICD-10-CM | POA: Diagnosis present

## 2019-04-30 DIAGNOSIS — Z20828 Contact with and (suspected) exposure to other viral communicable diseases: Secondary | ICD-10-CM | POA: Insufficient documentation

## 2019-05-01 LAB — NOVEL CORONAVIRUS, NAA (HOSP ORDER, SEND-OUT TO REF LAB; TAT 18-24 HRS): SARS-CoV-2, NAA: NOT DETECTED

## 2019-05-02 ENCOUNTER — Other Ambulatory Visit: Payer: Self-pay | Admitting: *Deleted

## 2019-05-02 DIAGNOSIS — R918 Other nonspecific abnormal finding of lung field: Secondary | ICD-10-CM

## 2019-05-03 ENCOUNTER — Ambulatory Visit: Payer: Medicare Other | Admitting: Primary Care

## 2019-05-03 ENCOUNTER — Encounter: Payer: Self-pay | Admitting: Primary Care

## 2019-05-03 ENCOUNTER — Ambulatory Visit (INDEPENDENT_AMBULATORY_CARE_PROVIDER_SITE_OTHER): Payer: Medicare Other | Admitting: Emergency Medicine

## 2019-05-03 ENCOUNTER — Other Ambulatory Visit: Payer: Self-pay

## 2019-05-03 DIAGNOSIS — R918 Other nonspecific abnormal finding of lung field: Secondary | ICD-10-CM | POA: Diagnosis not present

## 2019-05-03 LAB — PULMONARY FUNCTION TEST
DL/VA % pred: 97 %
DL/VA: 4 ml/min/mmHg/L
DLCO unc % pred: 97 %
DLCO unc: 23.24 ml/min/mmHg
FEF 25-75 Post: 3.5 L/sec
FEF 25-75 Pre: 3.05 L/sec
FEF2575-%Change-Post: 14 %
FEF2575-%Pred-Post: 155 %
FEF2575-%Pred-Pre: 135 %
FEV1-%Change-Post: 4 %
FEV1-%Pred-Post: 109 %
FEV1-%Pred-Pre: 105 %
FEV1-Post: 3.2 L
FEV1-Pre: 3.07 L
FEV1FVC-%Change-Post: 2 %
FEV1FVC-%Pred-Pre: 107 %
FEV6-%Change-Post: 2 %
FEV6-%Pred-Post: 104 %
FEV6-%Pred-Pre: 102 %
FEV6-Post: 3.92 L
FEV6-Pre: 3.82 L
FEV6FVC-%Change-Post: 0 %
FEV6FVC-%Pred-Post: 105 %
FEV6FVC-%Pred-Pre: 105 %
FVC-%Change-Post: 1 %
FVC-%Pred-Post: 99 %
FVC-%Pred-Pre: 97 %
FVC-Post: 3.95 L
FVC-Pre: 3.87 L
Post FEV1/FVC ratio: 81 %
Post FEV6/FVC ratio: 99 %
Pre FEV1/FVC ratio: 79 %
Pre FEV6/FVC Ratio: 99 %
RV % pred: 107 %
RV: 2.42 L
TLC % pred: 98 %
TLC: 6.29 L

## 2019-05-03 NOTE — Progress Notes (Signed)
PFT done today. 

## 2019-05-03 NOTE — Assessment & Plan Note (Signed)
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 Mohammed/oncology, last seen on 04/19/19  - Started on targeted oral chemotherapy with Tagrisso 80mg  daily - PFTs normal; no evidence of obstruction or restriction / FEV1 3.20 (109%) - FU in 3-4 months with Dr. Lamonte Sakai to monitor respiratory symptoms and then likely only as needed

## 2019-05-03 NOTE — Progress Notes (Addendum)
@Patient  ID: [name], male    DOB: 1950-09-15, 68 y.o.   MRN: [id number]  Chief Complaint  Patient presents with  . Follow-up    Referring provider: Aura Dials, MD  HPI: 68 year old male, former smoker (10 pack years). PMH significant for adenocarcinoma left lung stage 4, severe obstructive sleep apnea-hypopnea syndrome, thalamic stroke, LUE DVT. Patient of Dr. Lamonte Sakai, seen for initial consult on 03/27/19 for abnormal chest imaging and hemoptysis. He underwent CT chest 02/21/19 which showed left lower lobe opacity, mixed ground glass/solid appearance with an area of apparent cavitation. He had a navigational bronchoscopy on 9/23 which showed adenocarcinoma. PET scan showed 2.4 LLL cavitary mass which was highly hypermetabolic with max SUV 34.7 as well as other pulmonary nodules and scattered hypermetabolic osseous metastatic disease. Currently undergoing palliative radiotherapy to the left humerus, last treatment on 04/24/19. He saw Dr. Julien Nordmann on 04/19/19 and recommended to started targeted oral chemotherapy with Tagrisso 80mg  daily. Caner pain treated with percocet. Plan follow-up in 3 weeks.   05/03/2019 Patient presents today for follow-up visit with PFTs. He is doing very well considering, no respiratory complaints. States that he feels great. He has no shortness of breath. Occasional productive cough with white mucus. He has had no hemoptysis since stopping Xarelto. Bone pain has been subsiding, now mostly in the left shoulder. Takes 1/2 percocet at night if pain wakes him up. He has limited mobility to this arm, he was having physical therapy but that seemed to aggravate his pain more.   PFTs 05/03/2019 FVC 3.95 (99%), FEV1 3.20 (109%), ratio 81, TLC 98. No BD response, normal DLCO.  No Known Allergies   There is no immunization history on file for this patient.  Past Medical History:  Diagnosis Date  . A-fib [facility])    PMH: 2000-2001  . Arthritis   . Cancer ([place])    melanoma 2018  . DVT (deep venous thrombosis) (HCC)    Left brachial vein DVT (01/30/19, following fracture)  . ETOH abuse   . GERD (gastroesophageal reflux disease)   . Sleep apnea    do not wear CPAP  . Stroke [facility])    " mini"  . Vertigo   . Wears glasses     Tobacco History: Social History   Tobacco Use  Smoking Status Former Smoker  . Packs/day: 0.50  . Years: 20.00  . Pack years: 10.00  . Types: Cigarettes  . Quit date: 03/06/2019  . Years since quitting: 0.1  Smokeless Tobacco Never Used   Counseling given: Not Answered   Outpatient Medications Prior to Visit  Medication Sig Dispense Refill  . acetaminophen (TYLENOL) 650 MG CR tablet Take 1,300 mg by mouth every 8 (eight) hours as needed for pain.    Marland Kitchen aspirin 81 MG tablet Take 1 tablet (81 mg total) by mouth daily.    . Cyanocobalamin (B-12 PO) Take 1,000 mg by mouth daily.     . Omega-3 Fatty Acids (OMEGA-3 PO) Take 1,200 mg by mouth daily.     Marland Kitchen osimertinib mesylate (TAGRISSO) 80 MG tablet Take 1 tablet (80 mg total) by mouth daily. 30 tablet 2  . oxyCODONE-acetaminophen (PERCOCET/ROXICET) 5-325 MG tablet Take 1 tablet by mouth every 8 (eight) hours as needed for severe pain. 10 tablet 0  . prochlorperazine (COMPAZINE) 10 MG tablet Take 1 tablet (10 mg total) by mouth every 6 (six) hours as needed for nausea or vomiting. 30 tablet 1  . rosuvastatin (CRESTOR) 5 MG  tablet Take 5 mg by mouth daily.  2  . rivaroxaban (XARELTO) 20 MG TABS tablet Take 1 tablet (20 mg total) by mouth daily with supper. Okay to restart this medication on Saturday, 03/31/2019. (Patient not taking: Reported on 05/03/2019) 30 tablet    No facility-administered medications prior to visit.    Review of Systems  Review of Systems  Constitutional: Negative.  Negative for activity change, appetite change, fatigue, fever and unexpected weight change.  Respiratory: Positive for cough. Negative for chest tightness, shortness of breath and wheezing.    Gastrointestinal: Negative for nausea.  Musculoskeletal:       Left shoulder pain   Physical Exam  BP 134/70 (BP Location: Right Arm, Cuff Size: Large)   Pulse (!) 57   Ht 5\' 7"  (1.702 m)   Wt 183 lb (83 kg)   SpO2 100%   BMI 28.66 kg/m  Physical Exam Constitutional:      Appearance: Normal appearance.  HENT:     Head: Normocephalic and atraumatic.  Cardiovascular:     Rate and Rhythm: Normal rate and regular rhythm.  Pulmonary:     Effort: Pulmonary effort is normal.     Breath sounds: Normal breath sounds.     Comments: CTA Musculoskeletal:     Comments: Limited ROM to LUE  Neurological:     General: No focal deficit present.     Mental Status: He is alert and oriented to person, place, and time. Mental status is at baseline.  Psychiatric:        Mood and Affect: Mood normal.        Behavior: Behavior normal.        Thought Content: Thought content normal.        Judgment: Judgment normal.     Lab Results:  CBC    Component Value Date/Time   WBC 6.3 04/19/2019 1404   RBC 4.05 (L) 04/19/2019 1404   HGB 13.1 04/19/2019 1404   HCT 39.5 04/19/2019 1404   PLT 241 04/19/2019 1404   MCV 97.5 04/19/2019 1404   MCH 32.3 04/19/2019 1404   MCHC 33.2 04/19/2019 1404   RDW 13.7 04/19/2019 1404   LYMPHSABS 1.7 04/19/2019 1404   MONOABS 0.7 04/19/2019 1404   EOSABS 0.1 04/19/2019 1404   BASOSABS 0.0 04/19/2019 1404    BMET    Component Value Date/Time   NA 141 04/19/2019 1404   K 4.6 04/19/2019 1404   CL 106 04/19/2019 1404   CO2 25 04/19/2019 1404   GLUCOSE 109 (H) 04/19/2019 1404   BUN 20 04/19/2019 1404   CREATININE 1.10 04/19/2019 1404   CALCIUM 9.5 04/19/2019 1404   GFRNONAA >60 04/19/2019 1404   GFRAA >60 04/19/2019 1404    BNP No results found for: BNP  ProBNP No results found for: PROBNP  Imaging: Mr Jeri Cos YQ Contrast  Result Date: 04/16/2019 CLINICAL DATA:  Stage IV non-small cell lung cancer left lower lobe. EXAM: MRI HEAD WITHOUT AND  WITH CONTRAST TECHNIQUE: Multiplanar, multiecho pulse sequences of the brain and surrounding structures were obtained without and with intravenous contrast. CONTRAST:  3mL GADAVIST GADOBUTROL 1 MMOL/ML IV SOLN COMPARISON:  09/02/2017 FINDINGS: Brain: Diffusion imaging does not show any acute or subacute infarction or other cause of restricted diffusion. The brainstem and cerebellum are normal. 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 Mild chronic small-vessel change of the cerebral hemispheric white matter. No cortical or large vessel territory infarction. No sign of primary or metastatic mass lesion, hemorrhage,  hydrocephalus or extra-axial collection. Vascular: Major vessels at the base of the brain show flow. Skull and upper cervical spine: Negative Sinuses/Orbits: Sinuses are clear. Chronic deformity of the right globe. Other: None IMPRESSION: No evidence of metastatic disease. Chronic small-vessel ischemic changes as outlined above. Electronically Signed   By: Nelson Chimes M.D.   On: 04/16/2019 11:05   Nm Pet Image Initial (pi) Skull Base To Thigh  Result Date: 04/04/2019 CLINICAL DATA:  Initial treatment strategy for pulmonary nodules. EXAM: NUCLEAR MEDICINE PET SKULL BASE TO THIGH TECHNIQUE: 9.0 mCi F-18 FDG was injected intravenously. Full-ring PET imaging was performed from the skull base to thigh after the radiotracer. CT data was obtained and used for attenuation correction and anatomic localization. Fasting blood glucose: 106 mg/dl COMPARISON:  Chest CT from 02/21/2019 FINDINGS: Mediastinal blood pool activity: SUV max 2.4 Liver activity: SUV max 3.3 NECK: No significant abnormal hypermetabolic activity in this region. Incidental CT findings: Bilateral common carotid atherosclerotic calcification. Right scleral band. CHEST: Left lower lobe cavitary mass just posterior to the major fissure, with surrounding airspace opacity. The hypermetabolic region associated with this opacity measures 2.4 by 2.2  cm and has a maximum SUV of 12.5. Central cavitation noted. Below this there is airspace opacity which is not overtly hypermetabolic, leading down to a new cavitary lesion measuring 2.3 by 1.8 cm on image 40/8 which likewise is not particularly hypermetabolic, maximum SUV 2.0. Primarily along the right middle lobe side of the minor fissure, a nodule measuring approximately 2.1 by 1.1 cm on image 36/8 has a maximum SUV of 4.8. A lingular nodule measuring 0.8 by 0.6 cm on image 36/8 has a maximum SUV of 1.5. Medially in the right lower lobe on image 56/8, a 0.6 by 0.5 cm right lower lobe nodule is not appreciably hypermetabolic, but is below sensitive PET-CT size thresholds. The small mediastinal lymph nodes are not appreciably hypermetabolic. Incidental CT findings: Coronary, aortic arch, and branch vessel atherosclerotic vascular disease. ABDOMEN/PELVIS: No significant abnormal hypermetabolic activity in this region. Incidental CT findings: Aortoiliac atherosclerotic vascular disease. Diffuse urinary bladder wall thickening, cystitis is not excluded. Small bilateral scrotal hydroceles. SKELETON: Scattered hypermetabolic osseous lesions present and for the most part are relatively occult on the CT data, or with only very subtle lucency. Lesions are observed in the left C7 vertebral body and pedicle; in the left T8 vertebral body; in the left L3 pedicle; in the right ninth rib; in the left eighth rib in the left iliac crest; in the right iliac bone and adjacent right sacrum; in the left sacrum; and in the right anterior and posterior acetabular walls. Index lesion in the sacrum along the SI joint has a maximum SUV of 12.0. Index lesion in the left L3 pedicle has a maximum SUV of 8.7. There is also considerable accentuated activity within along the known left proximal humeral fracture which has an irregular appearance. I suspect that this is probably a pathologic fracture given the degree of activity although healing  response can sometimes simulated pathologic fracture. Maximum SUV along the left proximal humeral healing fracture site is 10.4. Incidental CT findings: The right ninth rib lesion is likely associated with a pathologic fracture degenerative endplate sclerosis at A2-1 and L4-5 with Schmorl's nodes. Suspected T8 compression fracture. IMPRESSION: 1. 2.4 cm left lower lobe cavitary mass is highly hypermetabolic with maximum SUV of 12.5, favoring malignancy especially in the context of other findings. This lesion is associated with adjacent left lower lobe airspace opacity and  a new cavitary lesion which are less hypermetabolic and probably infectious. Hypermetabolic peribronchovascular nodule in the right middle lobe just below the minor fissure, likely metastatic. There is also a smaller lingular nodule which is faintly metabolic and probably metastatic. 2. Scattered hypermetabolic osseous lesions compatible with metastatic disease, involving the spine, ribs, and bony pelvis. In addition, I suspect that there is an underlying metastatic lesion along the left proximal humeral fracture which may have predisposed to pathologic fracture. There is a pathologic fracture the right ninth rib as well. 3. Although lung cancer primary in the left lower lobe is certainly a possibility, by report the patient has a history of melanoma, and metastatic melanoma is also a differential diagnostic consideration. 4. Other imaging findings of potential clinical significance: Aortic Atherosclerosis (ICD10-I70.0). Coronary atherosclerosis. Diffuse urinary bladder wall thickening, cystitis not excluded. Small bilateral scrotal hydroceles. Electronically Signed   By: Van Clines M.D.   On: 04/04/2019 10:01     Assessment & Plan:  68 year old male, former smoker. 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 He underwent palliative radiotherapy to the left humerus, last treatment on  04/24/19. Recently started on oral chemotherapy with Dr. Earlie Server. He feels extremely well except for pain in left arm/shoulder. He is taking 1/2 tab percocet for pain at night if needed. He had normal PFTs today. These had been ordered earlier this month before he underwent his PET scan for possible surgical resection. He is having no respiratory symptoms. Occasional cough with clear mucus, no further hemoptysis. PCP recently stopped Xarelto. He will be following up with oncology next week on 05/10/19. I will have him follow-up with Dr. Lamonte Sakai in 3-4 months, otherwise we will see him on as needed basis if he develops respiratory symptoms.   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 Mohammed/oncology, last seen on 04/19/19  - Started on targeted oral chemotherapy with Tagrisso 80mg  daily - PFTs normal; no evidence of obstruction or restriction / FEV1 3.20 (109%) - FU in 3-4 months with Dr. Lamonte Sakai to monitor respiratory symptoms and then likely only as needed    Martyn Ehrich, NP 05/03/2019

## 2019-05-03 NOTE — Patient Instructions (Signed)
Pulmonary function testing today was normal  No changes to plan  Follow up with oncology next week on 11/5  Return to see Dr. Lamonte Sakai in 3-4 months, otherwise we will see you on an as needed basis if you develop respiratory symptoms

## 2019-05-10 ENCOUNTER — Inpatient Hospital Stay: Payer: Medicare Other

## 2019-05-10 ENCOUNTER — Encounter: Payer: Self-pay | Admitting: Internal Medicine

## 2019-05-10 ENCOUNTER — Telehealth: Payer: Self-pay | Admitting: Internal Medicine

## 2019-05-10 ENCOUNTER — Inpatient Hospital Stay: Payer: Medicare Other | Attending: Internal Medicine | Admitting: Internal Medicine

## 2019-05-10 ENCOUNTER — Other Ambulatory Visit: Payer: Self-pay

## 2019-05-10 VITALS — BP 112/48 | HR 55 | Temp 98.0°F | Resp 17 | Ht 67.0 in | Wt 181.8 lb

## 2019-05-10 DIAGNOSIS — Z5111 Encounter for antineoplastic chemotherapy: Secondary | ICD-10-CM

## 2019-05-10 DIAGNOSIS — Z86718 Personal history of other venous thrombosis and embolism: Secondary | ICD-10-CM | POA: Diagnosis not present

## 2019-05-10 DIAGNOSIS — Z8673 Personal history of transient ischemic attack (TIA), and cerebral infarction without residual deficits: Secondary | ICD-10-CM | POA: Insufficient documentation

## 2019-05-10 DIAGNOSIS — C3432 Malignant neoplasm of lower lobe, left bronchus or lung: Secondary | ICD-10-CM | POA: Insufficient documentation

## 2019-05-10 DIAGNOSIS — I4891 Unspecified atrial fibrillation: Secondary | ICD-10-CM | POA: Diagnosis not present

## 2019-05-10 DIAGNOSIS — Z85820 Personal history of malignant melanoma of skin: Secondary | ICD-10-CM | POA: Insufficient documentation

## 2019-05-10 DIAGNOSIS — C3492 Malignant neoplasm of unspecified part of left bronchus or lung: Secondary | ICD-10-CM | POA: Diagnosis not present

## 2019-05-10 DIAGNOSIS — Z7901 Long term (current) use of anticoagulants: Secondary | ICD-10-CM | POA: Insufficient documentation

## 2019-05-10 DIAGNOSIS — Z7982 Long term (current) use of aspirin: Secondary | ICD-10-CM | POA: Insufficient documentation

## 2019-05-10 DIAGNOSIS — Z79899 Other long term (current) drug therapy: Secondary | ICD-10-CM | POA: Diagnosis not present

## 2019-05-10 DIAGNOSIS — G4730 Sleep apnea, unspecified: Secondary | ICD-10-CM | POA: Insufficient documentation

## 2019-05-10 DIAGNOSIS — C7951 Secondary malignant neoplasm of bone: Secondary | ICD-10-CM | POA: Insufficient documentation

## 2019-05-10 LAB — CMP (CANCER CENTER ONLY)
ALT: 20 U/L (ref 0–44)
AST: 17 U/L (ref 15–41)
Albumin: 3.9 g/dL (ref 3.5–5.0)
Alkaline Phosphatase: 173 U/L — ABNORMAL HIGH (ref 38–126)
Anion gap: 10 (ref 5–15)
BUN: 18 mg/dL (ref 8–23)
CO2: 27 mmol/L (ref 22–32)
Calcium: 9.3 mg/dL (ref 8.9–10.3)
Chloride: 105 mmol/L (ref 98–111)
Creatinine: 1.39 mg/dL — ABNORMAL HIGH (ref 0.61–1.24)
GFR, Est AFR Am: 60 mL/min — ABNORMAL LOW (ref >60)
GFR, Estimated: 51 mL/min — ABNORMAL LOW (ref >60)
Glucose, Bld: 107 mg/dL — ABNORMAL HIGH (ref 70–99)
Potassium: 4.5 mmol/L (ref 3.5–5.1)
Sodium: 142 mmol/L (ref 135–145)
Total Bilirubin: 0.5 mg/dL (ref 0.3–1.2)
Total Protein: 7 g/dL (ref 6.5–8.1)

## 2019-05-10 LAB — CBC WITH DIFFERENTIAL (CANCER CENTER ONLY)
Abs Immature Granulocytes: 0.02 10*3/uL (ref 0.00–0.07)
Basophils Absolute: 0 10*3/uL (ref 0.0–0.1)
Basophils Relative: 1 %
Eosinophils Absolute: 0.2 10*3/uL (ref 0.0–0.5)
Eosinophils Relative: 3 %
HCT: 40.9 % (ref 39.0–52.0)
Hemoglobin: 13.6 g/dL (ref 13.0–17.0)
Immature Granulocytes: 0 %
Lymphocytes Relative: 21 %
Lymphs Abs: 1.4 10*3/uL (ref 0.7–4.0)
MCH: 32.2 pg (ref 26.0–34.0)
MCHC: 33.3 g/dL (ref 30.0–36.0)
MCV: 96.7 fL (ref 80.0–100.0)
Monocytes Absolute: 0.6 10*3/uL (ref 0.1–1.0)
Monocytes Relative: 9 %
Neutro Abs: 4.3 10*3/uL (ref 1.7–7.7)
Neutrophils Relative %: 66 %
Platelet Count: 164 10*3/uL (ref 150–400)
RBC: 4.23 MIL/uL (ref 4.22–5.81)
RDW: 13.2 % (ref 11.5–15.5)
WBC Count: 6.6 10*3/uL (ref 4.0–10.5)
nRBC: 0 % (ref 0.0–0.2)

## 2019-05-10 NOTE — Telephone Encounter (Signed)
Scheduled per 11/05 los, patient will receive updates on My chart.

## 2019-05-10 NOTE — Progress Notes (Signed)
[place] [phone]   Fax:([phone]  OFFICE PROGRESS NOTE  Aura Dials, MD [place] [address]  DIAGNOSIS: Stage IV (T3, N0, M1c) non-small cell lung cancer, adenocarcinoma presented with cavitary left lower lobe lung mass in addition to other pulmonary nodules and scattered hypermetabolic osseous metastatic disease involving the spine, ribs and bony pelvis as well as left proximal humeral pathologic fracture.   Molecular Biomarkers performed by [employer](S) / [name]      [id number]  Afatinib, Dacomitinib, Erlotinib, Gefitinib, Osimertinib, Ramucirumab Neratinib  FGFR1Amplification  Erdafitinib, Lenvatinib, Nintedanib, Pazopanib, Pemigatinib, Ponatinib  [id number] None  PRIOR THERAPY: None  CURRENT THERAPY:  1) Palliative radiotherapy to the the left humerus under the care of Dr. Sondra Come. Last treatment expected on 04/24/2019 2) Targeted therapy with Tagrisso 80 p.o. daily.  He started the first dose on April 21, 2019.  INTERVAL HISTORY: Albert Perez 68 y.o. male returns to the clinic today for follow-up visit.  The patient is feeling fine today with no concerning complaints.  He is currently on treatment with Tagrisso 80 mg p.o. daily started April 21, 2019.  He is tolerating his treatment well with no concerning adverse effects.  He had one episode of diarrhea and very mild skin rash.  He denied having any recent weight loss or night sweats.  He has no nausea, vomiting, diarrhea or constipation.  He has no fever or chills.  He denied having any headache or visual changes.  He is here today for evaluation and repeat blood work.  MEDICAL HISTORY: Past Medical History:  Diagnosis Date  . A-fib [facility])    PMH: 2000-2001  . Arthritis   . Cancer ([place])    melanoma 2018  . DVT (deep venous thrombosis) (HCC)    Left brachial vein DVT (01/30/19, following fracture)  . ETOH  abuse   . GERD (gastroesophageal reflux disease)   . Sleep apnea    do not wear CPAP  . Stroke [facility])    " mini"  . Vertigo   . Wears glasses     ALLERGIES:  has No Known Allergies.  MEDICATIONS:  Current Outpatient Medications  Medication Sig Dispense Refill  . acetaminophen (TYLENOL) 650 MG CR tablet Take 1,300 mg by mouth every 8 (eight) hours as needed for pain.    Marland Kitchen aspirin 81 MG tablet Take 1 tablet (81 mg total) by mouth daily.    . Cyanocobalamin (B-12 PO) Take 1,000 mg by mouth daily.     . Omega-3 Fatty Acids (OMEGA-3 PO) Take 1,200 mg by mouth daily.     Marland Kitchen osimertinib mesylate (TAGRISSO) 80 MG tablet Take 1 tablet (80 mg total) by mouth daily. 30 tablet 2  . oxyCODONE-acetaminophen (PERCOCET/ROXICET) 5-325 MG tablet Take 1 tablet by mouth every 8 (eight) hours as needed for severe pain. 10 tablet 0  . prochlorperazine (COMPAZINE) 10 MG tablet Take 1 tablet (10 mg total) by mouth every 6 (six) hours as needed for nausea or vomiting. 30 tablet 1  . rivaroxaban (XARELTO) 20 MG TABS tablet Take 1 tablet (20 mg total) by mouth daily with supper. Okay to restart this medication on Saturday, 03/31/2019. (Patient not taking: Reported on 05/03/2019) 30 tablet   . rosuvastatin (CRESTOR) 5 MG tablet Take 5 mg by mouth daily.  2   No current facility-administered medications for this visit.     SURGICAL HISTORY:  Past Surgical History:  Procedure  Laterality Date  . ANKLE FRACTURE SURGERY Right 2003  . COLONOSCOPY  2018  . ELBOW SURGERY Left 1964  . melanoma removal  04/2017   Lt shoulder  . RETINAL DETACHMENT SURGERY Right 1971  . TONSILLECTOMY    . VIDEO BRONCHOSCOPY WITH ENDOBRONCHIAL NAVIGATION N/A 03/28/2019   Procedure: VIDEO BRONCHOSCOPY WITH ENDOBRONCHIAL NAVIGATION and Biopsies;  Surgeon: Collene Gobble, MD;  Location: [facility] OR;  Service: Thoracic;  Laterality: N/A;    REVIEW OF SYSTEMS:  A comprehensive review of systems was negative.   PHYSICAL EXAMINATION: General  appearance: alert, cooperative and no distress Head: Normocephalic, without obvious abnormality, atraumatic Neck: no adenopathy, no JVD, supple, symmetrical, trachea midline and thyroid not enlarged, symmetric, no tenderness/mass/nodules Lymph nodes: Cervical, supraclavicular, and axillary nodes normal. Resp: clear to auscultation bilaterally Back: symmetric, no curvature. ROM normal. No CVA tenderness. Cardio: regular rate and rhythm, S1, S2 normal, no murmur, click, rub or gallop GI: soft, non-tender; bowel sounds normal; no masses,  no organomegaly Extremities: extremities normal, atraumatic, no cyanosis or edema  ECOG PERFORMANCE STATUS: 1 - Symptomatic but completely ambulatory  Blood pressure (!) 112/48, pulse (!) 55, temperature 98 F (36.7 C), temperature source Temporal, resp. rate 17, height '5\' 7"'  (1.702 m), weight 181 lb 12.8 oz (82.5 kg), SpO2 99 %.  LABORATORY DATA: Lab Results  Component Value Date   WBC 6.6 05/10/2019   HGB 13.6 05/10/2019   HCT 40.9 05/10/2019   MCV 96.7 05/10/2019   PLT 164 05/10/2019      Chemistry      Component Value Date/Time   NA 141 04/19/2019 1404   K 4.6 04/19/2019 1404   CL 106 04/19/2019 1404   CO2 25 04/19/2019 1404   BUN 20 04/19/2019 1404   CREATININE 1.10 04/19/2019 1404      Component Value Date/Time   CALCIUM 9.5 04/19/2019 1404   ALKPHOS 108 04/19/2019 1404   AST 20 04/19/2019 1404   ALT 22 04/19/2019 1404   BILITOT 0.6 04/19/2019 1404       RADIOGRAPHIC STUDIES: Mr Jeri Cos KH Contrast  Result Date: 04/16/2019 CLINICAL DATA:  Stage IV non-small cell lung cancer left lower lobe. EXAM: MRI HEAD WITHOUT AND WITH CONTRAST TECHNIQUE: Multiplanar, multiecho pulse sequences of the brain and surrounding structures were obtained without and with intravenous contrast. CONTRAST:  3m GADAVIST GADOBUTROL 1 MMOL/ML IV SOLN COMPARISON:  09/02/2017 FINDINGS: Brain: Diffusion imaging does not show any acute or subacute infarction or  other cause of restricted diffusion. The brainstem and cerebellum are normal. 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 Mild chronic small-vessel change of the cerebral hemispheric white matter. No cortical or large vessel territory infarction. No sign of primary or metastatic mass lesion, hemorrhage, hydrocephalus or extra-axial collection. Vascular: Major vessels at the base of the brain show flow. Skull and upper cervical spine: Negative Sinuses/Orbits: Sinuses are clear. Chronic deformity of the right globe. Other: None IMPRESSION: No evidence of metastatic disease. Chronic small-vessel ischemic changes as outlined above. Electronically Signed   By: MNelson ChimesM.D.   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  He is status post palliative radiotherapy to the metastatic disease in the left arm. The patient is currently undergoing treatment with target therapy with Tagrisso 80 mg p.o. daily started 3 weeks ago.  The patient is tolerating this treatment well with no concerning adverse effects. His lab work is unremarkable today except for mild renal insufficiency. I recommended for the patient to continue his current treatment with Tagrisso with the same dose. I will see him back for follow-up visit in 3 weeks for evaluation and repeat blood work. He was advised to call immediately if he has any concerning symptoms in the interval. The patient voices understanding of current disease status and treatment options and is in agreement with the current care plan.  All questions were  answered. The patient knows to call the clinic with any problems, questions or concerns. We can certainly see the patient much sooner if necessary.  I spent 10 minutes counseling the patient face to face. The total time spent in the appointment was 15 minutes.  Disclaimer: This note was dictated with voice recognition software. Similar sounding words can inadvertently be transcribed and may not be corrected upon review.

## 2019-05-11 ENCOUNTER — Telehealth: Payer: Self-pay

## 2019-05-11 NOTE — Telephone Encounter (Signed)
Oral Oncology Patient Advocate Encounter  Tagrisso patient assistance with [employer]&ME will expire 07/05/19  I have a completed renewal application that is ready to send when the renewal period starts.  Oral Oncology clinic will continue to follow.  [facility] [place] Phone [phone] Fax [phone] 05/11/2019   2:15 PM

## 2019-05-14 ENCOUNTER — Encounter: Payer: Self-pay | Admitting: Radiation Oncology

## 2019-05-14 NOTE — Progress Notes (Incomplete)
°  Patient Name: Albert Perez MRN: [id number] DOB: 1951-02-10 Referring Physician: Aura Dials (Profile Not Attached) Date of Service: 04/24/2019 [facility], [place]                                                        End Of Treatment Note  Diagnoses: C79.51-Secondary malignant neoplasm of bone  Cancer Staging: Stage IV (T3, N0, M1c) non-small cell lung cancer, adenocarcinoma with left proximal humerus pathologic fracture  Intent: Palliative  Radiation Treatment Dates: 04/11/2019 through 04/24/2019 Site Technique Total Dose (Gy) Dose per Fx (Gy) Completed Fx Beam Energies  Extremity: Ext_Lt_humerus Complex 30/30 3 10/10 10X   Narrative: The patient tolerated radiation therapy relatively well. On first week of treatment, patient reported increased pain in left elbow and shoulder at night. Pain exacerbated when laying down. Fatigue level was high at that time. Pain and fatigue had improved by second week of treatment. However, movement in left shoulder had decreased from first to second radiation treatment. The left upper arm revealed no significant radiation reaction and the plain x-ray did not show any evidence of a new fracture.   Plan: The patient will follow-up with radiation oncology in one month.  ________________________________________________  -----------------------------------  Blair Promise, PhD, MD  This document serves as a record of services personally performed by Gery Pray, MD. It was created on his behalf by Clerance Lav, a trained medical scribe. The creation of this record is based on the scribe's personal observations and the provider's statements to them. This document has been checked and approved by the attending provider.

## 2019-05-29 ENCOUNTER — Encounter: Payer: Self-pay | Admitting: Physician Assistant

## 2019-05-29 ENCOUNTER — Inpatient Hospital Stay: Payer: Medicare Other

## 2019-05-29 ENCOUNTER — Other Ambulatory Visit: Payer: Self-pay

## 2019-05-29 ENCOUNTER — Telehealth: Payer: Self-pay | Admitting: Physician Assistant

## 2019-05-29 ENCOUNTER — Inpatient Hospital Stay: Payer: Medicare Other | Admitting: Physician Assistant

## 2019-05-29 VITALS — BP 139/70 | HR 60 | Temp 97.9°F | Resp 18 | Ht 67.0 in | Wt 181.5 lb

## 2019-05-29 DIAGNOSIS — C3492 Malignant neoplasm of unspecified part of left bronchus or lung: Secondary | ICD-10-CM

## 2019-05-29 DIAGNOSIS — C3432 Malignant neoplasm of lower lobe, left bronchus or lung: Secondary | ICD-10-CM | POA: Diagnosis not present

## 2019-05-29 LAB — CBC WITH DIFFERENTIAL (CANCER CENTER ONLY)
Abs Immature Granulocytes: 0.02 10*3/uL (ref 0.00–0.07)
Basophils Absolute: 0 10*3/uL (ref 0.0–0.1)
Basophils Relative: 1 %
Eosinophils Absolute: 0.2 10*3/uL (ref 0.0–0.5)
Eosinophils Relative: 3 %
HCT: 41 % (ref 39.0–52.0)
Hemoglobin: 13.3 g/dL (ref 13.0–17.0)
Immature Granulocytes: 0 %
Lymphocytes Relative: 16 %
Lymphs Abs: 1 10*3/uL (ref 0.7–4.0)
MCH: 31.3 pg (ref 26.0–34.0)
MCHC: 32.4 g/dL (ref 30.0–36.0)
MCV: 96.5 fL (ref 80.0–100.0)
Monocytes Absolute: 0.6 10*3/uL (ref 0.1–1.0)
Monocytes Relative: 10 %
Neutro Abs: 4.2 10*3/uL (ref 1.7–7.7)
Neutrophils Relative %: 70 %
Platelet Count: 159 10*3/uL (ref 150–400)
RBC: 4.25 MIL/uL (ref 4.22–5.81)
RDW: 13.2 % (ref 11.5–15.5)
WBC Count: 6 10*3/uL (ref 4.0–10.5)
nRBC: 0 % (ref 0.0–0.2)

## 2019-05-29 LAB — CMP (CANCER CENTER ONLY)
ALT: 18 U/L (ref 0–44)
AST: 18 U/L (ref 15–41)
Albumin: 3.7 g/dL (ref 3.5–5.0)
Alkaline Phosphatase: 123 U/L (ref 38–126)
Anion gap: 10 (ref 5–15)
BUN: 17 mg/dL (ref 8–23)
CO2: 25 mmol/L (ref 22–32)
Calcium: 9.1 mg/dL (ref 8.9–10.3)
Chloride: 107 mmol/L (ref 98–111)
Creatinine: 1.24 mg/dL (ref 0.61–1.24)
GFR, Est AFR Am: >60 mL/min (ref >60)
GFR, Estimated: 59 mL/min — ABNORMAL LOW (ref >60)
Glucose, Bld: 121 mg/dL — ABNORMAL HIGH (ref 70–99)
Potassium: 4.5 mmol/L (ref 3.5–5.1)
Sodium: 142 mmol/L (ref 135–145)
Total Bilirubin: 0.5 mg/dL (ref 0.3–1.2)
Total Protein: 7.1 g/dL (ref 6.5–8.1)

## 2019-05-29 NOTE — Progress Notes (Signed)
[facility] OFFICE PROGRESS NOTE  Aura Dials, MD [place] [address]  DIAGNOSIS: Stage IV (T3, N0, M1c) non-small cell lung cancer, adenocarcinoma presented with cavitary left lower lobe lung mass in addition to other pulmonary nodules and scattered hypermetabolic osseous metastatic disease involving the spine, ribs and bony pelvis as well as left proximal humeral pathologic fracture.   Molecular Biomarkers performed by [employer] DETECTED [employer]TERATION(S) / [name] [id number]  Afatinib, Dacomitinib, Erlotinib, Gefitinib, Osimertinib, Ramucirumab Neratinib  FGFR1Amplification  Erdafitinib, Lenvatinib, Nintedanib, Pazopanib, Pemigatinib, Ponatinib  [id number]  PRIOR THERAPY: None  CURRENT THERAPY: 1) Palliative radiotherapy to the the left humerus under the care of Dr. Sondra Come. Last treatment expected on 04/24/2019 2) Targeted therapy with Tagrisso 80 p.o. daily.  He started the first dose on April 21, 2019. Status post 1 month of treatment.   INTERVAL HISTORY: JAVARES KAUFHOLD 68 y.o. male returns to the clinic for a follow up visit. The patient is feeling well today without any concerning complaints except for a mild rash on his ankle and left chest. He has been using hydrocortisone for this concern because it is pruritic. He also notes occasional fatigue. Otherwise, the patient continues to tolerate treatment with oral chemotherapy with Tagrisso well besides the mild skin rash. Denies any fever, chills, night sweats, or weight loss. Denies any chest pain, shortness of breath, cough, or hemoptysis. Denies any nausea, vomiting, or constipation. He has mild diarrhea "now and again" but it never exceeds more than 1 loose stool per day. He has not needed to take imodium for his symptoms. Denies any headache or visual changes.  The patient is here today for evaluation and repeat labs.    MEDICAL HISTORY: Past Medical History:   Diagnosis Date  . A-fib [facility])    PMH: 2000-2001  . Arthritis   . Cancer ([facility])    melanoma 2018  . DVT (deep venous thrombosis) (H[facility])    Left brachial vein DVT (01/30/19, following fracture)  . ETOH abuse   . GERD (gastroesophageal reflux disease)   . Sleep apnea    do not wear CPAP  . Stroke [facility])    " mini"  . Vertigo   . Wears glasses     ALLERGIES:  has No Known Allergies.  MEDICATIONS:  Current Outpatient Medications  Medication Sig Dispense Refill  . acetaminophen (TYLENOL) 650 MG CR tablet Take 1,300 mg by mouth every 8 (eight) hours as needed for pain.    Marland Kitchen aspirin 81 MG tablet Take 1 tablet (81 mg total) by mouth daily.    . Cyanocobalamin (B-12 PO) Take 1,000 mg by mouth daily.     . Omega-3 Fatty Acids (OMEGA-3 PO) Take 1,200 mg by mouth daily.     Marland Kitchen osimertinib mesylate (TAGRISSO) 80 MG tablet Take 1 tablet (80 mg total) by mouth daily. 30 tablet 2  . prochlorperazine (COMPAZINE) 10 MG tablet Take 1 tablet (10 mg total) by mouth every 6 (six) hours as needed for nausea or vomiting. 30 tablet 1  . rosuvastatin (CRESTOR) 5 MG tablet Take 5 mg by mouth daily.  2  . oxyCODONE-acetaminophen (PERCOCET/ROXICET) 5-325 MG tablet Take 1 tablet by mouth every 8 (eight) hours as needed for severe pain. (Patient not taking: Reported on 05/29/2019) 10 tablet 0   No current facility-administered medications for this visit.     SURGICAL HISTORY:  Past Surgical History:  Procedure Laterality Date  . ANKLE FRACTURE SURGERY Right 2003  .  COLONOSCOPY  2018  . ELBOW SURGERY Left 1964  . melanoma removal  04/2017   Lt shoulder  . RETINAL DETACHMENT SURGERY Right 1971  . TONSILLECTOMY    . VIDEO BRONCHOSCOPY WITH ENDOBRONCHIAL NAVIGATION N/A 03/28/2019   Procedure: VIDEO BRONCHOSCOPY WITH ENDOBRONCHIAL NAVIGATION and Biopsies;  Surgeon: Collene Gobble, MD;  Location: [facility] OR;  Service: Thoracic;  Laterality: N/A;    REVIEW OF SYSTEMS:   Review of Systems  Constitutional:  Positive for occasional fatigue. Negative for appetite change, chills, fever and unexpected weight change.  HENT: Negative for mouth sores, nosebleeds, sore throat and trouble swallowing.   Eyes: Negative for eye problems and icterus.  Respiratory: Negative for cough, hemoptysis, shortness of breath and wheezing.   Cardiovascular: Negative for chest pain and leg swelling.  Gastrointestinal: Positive for mild occasional diarrhea. Negative for abdominal pain, constipation, nausea and vomiting.  Genitourinary: Negative for bladder incontinence, difficulty urinating, dysuria, frequency and hematuria.   Musculoskeletal: Negative for back pain, gait problem, neck pain and neck stiffness.  Skin: Positive for pruritic rash rash on ankle and left shoulder.  Neurological: Negative for dizziness, extremity weakness, gait problem, headaches, light-headedness and seizures.  Hematological: Negative for adenopathy. Does not bruise/bleed easily.  Psychiatric/Behavioral: Negative for confusion, depression and sleep disturbance. The patient is not nervous/anxious.     PHYSICAL EXAMINATION:  Blood pressure 139/70, pulse 60, temperature 97.9 F (36.6 C), temperature source Temporal, resp. rate 18, height '5\' 7"'  (1.702 m), weight 181 lb 8 oz (82.3 kg), SpO2 98 %.  ECOG PERFORMANCE STATUS: 1 - Symptomatic but completely ambulatory  Physical Exam  Constitutional: Oriented to person, place, and time and well-developed, well-nourished, and in no distress.  HENT:  Head: Normocephalic and atraumatic.  Mouth/Throat: Oropharynx is clear and moist. No oropharyngeal exudate.  Eyes: Conjunctivae are normal. Right eye exhibits no discharge. Left eye exhibits no discharge. No scleral icterus.  Neck: Normal range of motion. Neck supple.  Cardiovascular: Normal rate, regular rhythm, normal heart sounds and intact distal pulses.   Pulmonary/Chest: Effort normal and breath sounds normal. No respiratory distress. No wheezes. No  rales.  Abdominal: Soft. Bowel sounds are normal. Exhibits no distension and no mass. There is no tenderness.  Musculoskeletal: Normal range of motion. Exhibits no edema.  Lymphadenopathy:    No cervical adenopathy.  Neurological: Alert and oriented to person, place, and time. Exhibits normal muscle tone. Gait normal. Coordination normal.  Skin: Skin is warm and dry. No rash noted. Not diaphoretic. No erythema. No pallor.  Psychiatric: Mood, memory and judgment normal.  Vitals reviewed.  LABORATORY DATA: Lab Results  Component Value Date   WBC 6.0 05/29/2019   HGB 13.3 05/29/2019   HCT 41.0 05/29/2019   MCV 96.5 05/29/2019   PLT 159 05/29/2019      Chemistry      Component Value Date/Time   NA 142 05/29/2019 1117   K 4.5 05/29/2019 1117   CL 107 05/29/2019 1117   CO2 25 05/29/2019 1117   BUN 17 05/29/2019 1117   CREATININE 1.24 05/29/2019 1117      Component Value Date/Time   CALCIUM 9.1 05/29/2019 1117   ALKPHOS 123 05/29/2019 1117   AST 18 05/29/2019 1117   ALT 18 05/29/2019 1117   BILITOT 0.5 05/29/2019 1117       RADIOGRAPHIC STUDIES:  No results found.   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 He presented with cavitary left lower lobe lung mass in addition to other pulmonary nodules and scattered hypermetabolic osseous metastatic disease involving the spines, ribs and bony pelvis with left proximal humeral pathologic fracture diagnosed in September 2020.  He is status post palliative radiotherapy to the metastatic disease in the left arm.  The patient is currently undergoing treatment with target therapy with Tagrisso 80 mg p.o. daily started 3 weeks ago.  The patient is tolerating this treatment well with no concerning adverse effects except for a mild skin rash. He has been on this approximately 1 month.   The patient was  seen with Dr. Julien Nordmann. Labs were WNL. Dr. Julien Nordmann recommends that the patient continue on the same treatment at the same dose.   We will see the patient back for a follow up visit in 1 month with a restaging CT scan of the chest, abdomen, and pelvis.   For his mild skin rash, the patient will continue to use hydrocortisone cream. His diarrhea is mild and has resolved without the use of imodium .   The patient was advised to call immediately if he has any concerning symptoms in the interval. The patient voices understanding of current disease status and treatment options and is in agreement with the current care plan. All questions were answered. The patient knows to call the clinic with any problems, questions or concerns. We can certainly see the patient much sooner if necessary     Orders Placed This Encounter  Procedures  . CT Chest W Contrast    Standing Status:   Future    Standing Expiration Date:   05/28/2020    Order Specific Question:   ** REASON FOR EXAM (FREE TEXT)    Answer:   Restaging Lung Cancer    Order Specific Question:   If indicated for the ordered procedure, I authorize the administration of contrast media per Radiology protocol    Answer:   Yes    Order Specific Question:   Preferred imaging location?    Answer:   [facility]    Order Specific Question:   Radiology Contrast Protocol - do NOT remove file path    Answer:   \\[email or web address].pdf  . CT Abdomen Pelvis W Contrast    Standing Status:   Future    Standing Expiration Date:   05/28/2020    Order Specific Question:   ** REASON FOR EXAM (FREE TEXT)    Answer:   Restaging Lung Cancer    Order Specific Question:   If indicated for the ordered procedure, I authorize the administration of contrast media per Radiology protocol    Answer:   Yes    Order Specific Question:   Preferred imaging location?    Answer:   [facility]    Order Specific Question:   Is Oral  Contrast requested for this exam?    Answer:   Yes, Per Radiology protocol    Order Specific Question:   Radiology Contrast Protocol - do NOT remove file path    Answer:   \\[email or web address]\CTProtocols.pdf  . [place] ([place] only)    Standing Status:   Future    Standing Expiration Date:   05/28/2020  . CBC with Differential (Cancer Center Only)    Standing Status:   Future    Standing Expiration Date:   05/28/2020     Tobe Sos Rileigh Kawashima, PA-C 05/29/19  ADDENDUM: Hematology/Oncology Attending: I had a face-to-face  encounter with the patient today.  I recommended his care plan.  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  He is currently undergoing treatment with targeted therapy with Tagrisso 80 mg p.o. daily status post 1 months of treatment.  The patient has been tolerating this treatment well with no concerning adverse effect except for few episodes of diarrhea.  He even did not have to take Imodium for it.  He denied having any other significant chest pain, shortness of breath, cough or hemoptysis.  He denied having any weight loss or night sweats. I recommended for the patient to continue his current treatment with Tagrisso with the same dose. We will see him back for follow-up visit in 1 months for evaluation with repeat CT scan of the chest, abdomen pelvis for restaging of his disease. The patient was advised to call immediately if he has any concerning symptoms in the interval.  Disclaimer: This note was dictated with voice recognition software. Similar sounding words can inadvertently be transcribed and may be missed upon review. Eilleen Kempf, MD 05/29/19

## 2019-05-29 NOTE — Telephone Encounter (Signed)
Scheduled per los. Gave avs and calendar  

## 2019-05-31 ENCOUNTER — Encounter: Payer: Self-pay | Admitting: Radiation Oncology

## 2019-06-04 ENCOUNTER — Other Ambulatory Visit: Payer: Self-pay

## 2019-06-04 ENCOUNTER — Encounter: Payer: Self-pay | Admitting: Radiation Oncology

## 2019-06-04 ENCOUNTER — Ambulatory Visit
Admission: RE | Admit: 2019-06-04 | Discharge: 2019-06-04 | Disposition: A | Discharge status: Home / Self Care | Payer: Medicare Other | Source: Ambulatory Visit | Attending: Radiation Oncology | Admitting: Radiation Oncology

## 2019-06-04 ENCOUNTER — Emergency Department (HOSPITAL_COMMUNITY)
Admission: EM | Admit: 2019-06-04 | Discharge: 2019-06-04 | Disposition: A | Discharge status: Home / Self Care | Payer: Medicare Other | Attending: Emergency Medicine | Admitting: Emergency Medicine

## 2019-06-04 VITALS — BP 127/79 | HR 140 | Temp 98.0°F | Resp 20 | Wt 179.0 lb

## 2019-06-04 DIAGNOSIS — C3492 Malignant neoplasm of unspecified part of left bronchus or lung: Secondary | ICD-10-CM | POA: Insufficient documentation

## 2019-06-04 DIAGNOSIS — I10 Essential (primary) hypertension: Secondary | ICD-10-CM | POA: Diagnosis not present

## 2019-06-04 DIAGNOSIS — I471 Supraventricular tachycardia: Secondary | ICD-10-CM | POA: Diagnosis not present

## 2019-06-04 DIAGNOSIS — C7951 Secondary malignant neoplasm of bone: Secondary | ICD-10-CM | POA: Insufficient documentation

## 2019-06-04 DIAGNOSIS — Z7982 Long term (current) use of aspirin: Secondary | ICD-10-CM | POA: Insufficient documentation

## 2019-06-04 DIAGNOSIS — Z923 Personal history of irradiation: Secondary | ICD-10-CM | POA: Insufficient documentation

## 2019-06-04 DIAGNOSIS — Z79899 Other long term (current) drug therapy: Secondary | ICD-10-CM | POA: Insufficient documentation

## 2019-06-04 DIAGNOSIS — R000 Tachycardia, unspecified: Secondary | ICD-10-CM | POA: Diagnosis present

## 2019-06-04 DIAGNOSIS — R918 Other nonspecific abnormal finding of lung field: Secondary | ICD-10-CM

## 2019-06-04 LAB — COMPREHENSIVE METABOLIC PANEL
ALT: 24 U/L (ref 0–44)
AST: 22 U/L (ref 15–41)
Albumin: 4 g/dL (ref 3.5–5.0)
Alkaline Phosphatase: 102 U/L (ref 38–126)
Anion gap: 11 (ref 5–15)
BUN: 15 mg/dL (ref 8–23)
CO2: 19 mmol/L — ABNORMAL LOW (ref 22–32)
Calcium: 9 mg/dL (ref 8.9–10.3)
Chloride: 108 mmol/L (ref 98–111)
Creatinine, Ser: 1.22 mg/dL (ref 0.61–1.24)
GFR calc Af Amer: >60 mL/min (ref >60)
GFR calc non Af Amer: >60 mL/min (ref >60)
Glucose, Bld: 122 mg/dL — ABNORMAL HIGH (ref 70–99)
Potassium: 4.2 mmol/L (ref 3.5–5.1)
Sodium: 138 mmol/L (ref 135–145)
Total Bilirubin: 1 mg/dL (ref 0.3–1.2)
Total Protein: 7.5 g/dL (ref 6.5–8.1)

## 2019-06-04 LAB — CBC WITH DIFFERENTIAL/PLATELET
Abs Immature Granulocytes: 0.02 10*3/uL (ref 0.00–0.07)
Basophils Absolute: 0.1 10*3/uL (ref 0.0–0.1)
Basophils Relative: 1 %
Eosinophils Absolute: 0.2 10*3/uL (ref 0.0–0.5)
Eosinophils Relative: 3 %
HCT: 46 % (ref 39.0–52.0)
Hemoglobin: 15.1 g/dL (ref 13.0–17.0)
Immature Granulocytes: 0 %
Lymphocytes Relative: 22 %
Lymphs Abs: 1.3 10*3/uL (ref 0.7–4.0)
MCH: 31.3 pg (ref 26.0–34.0)
MCHC: 32.8 g/dL (ref 30.0–36.0)
MCV: 95.2 fL (ref 80.0–100.0)
Monocytes Absolute: 0.7 10*3/uL (ref 0.1–1.0)
Monocytes Relative: 11 %
Neutro Abs: 3.8 10*3/uL (ref 1.7–7.7)
Neutrophils Relative %: 63 %
Platelets: 164 10*3/uL (ref 150–400)
RBC: 4.83 MIL/uL (ref 4.22–5.81)
RDW: 13 % (ref 11.5–15.5)
WBC: 5.9 10*3/uL (ref 4.0–10.5)
nRBC: 0 % (ref 0.0–0.2)

## 2019-06-04 LAB — MAGNESIUM: Magnesium: 2 mg/dL (ref 1.7–2.4)

## 2019-06-04 MED ORDER — SODIUM CHLORIDE 0.9 % IV BOLUS
1000.0000 mL | Freq: Once | INTRAVENOUS | Status: AC
  Administered 2019-06-04: 1000 mL via INTRAVENOUS

## 2019-06-04 MED ORDER — DILTIAZEM HCL 25 MG/5ML IV SOLN
20.0000 mg | Freq: Once | INTRAVENOUS | Status: DC
  Filled 2019-06-04: qty 5

## 2019-06-04 NOTE — Progress Notes (Signed)
Radiation Oncology         [phone] ________________________________  Name: AVIYON HOCEVAR MRN: [id number]  Date: 06/04/2019  DOB: [birth year]  Follow-Up Visit Note  CC: Aura Dials, MD  Aura Dials, MD    ICD-10-CM   1. Adenocarcinoma of left lung, stage 4 (HCC)  C34.92   2. Mass of lower lobe of left lung  R91.8     Diagnosis: Stage IV (T3, N0, M1c) non-small cell lung cancer, adenocarcinomawith left proximal humerus pathologic fracture  Interval Since Last Radiation: One month and one week.  Radiation Treatment Dates: 04/11/2019 through 04/24/2019 Site Technique Total Dose (Gy) Dose per Fx (Gy) Completed Fx Beam Energies  Extremity: Ext_Lt_humerus Complex 30/30 3 10/10 10X    Narrative:  The patient returns today for routine follow-up. Since end of treatment, patient was seen by Geraldo Pitter, pulmonology NP, on 05/03/2019. PFTs were normal at that time. He also followed up with Dr. Julien Nordmann on 05/10/2019 and 05/29/2019 He is undergoing treatment with Tagrisso 80 mg p.o. daily that was started on 04/21/2019.  On review of systems, patient reports increased heart rate with some dyspnea. He denies dizziness or lightheadedness and all other symptoms.                            ALLERGIES:  has No Known Allergies.  Meds: Current Outpatient Medications  Medication Sig Dispense Refill  . acetaminophen (TYLENOL) 650 MG CR tablet Take 1,300 mg by mouth every 8 (eight) hours as needed for pain.    Marland Kitchen aspirin 81 MG tablet Take 1 tablet (81 mg total) by mouth daily.    . Cyanocobalamin (B-12 PO) Take 1,000 mg by mouth daily.     . Omega-3 Fatty Acids (OMEGA-3 PO) Take 1,200 mg by mouth daily.     Marland Kitchen osimertinib mesylate (TAGRISSO) 80 MG tablet Take 1 tablet (80 mg total) by mouth daily. 30 tablet 2  . rosuvastatin (CRESTOR) 5 MG tablet Take 5 mg by mouth daily.  2  . oxyCODONE-acetaminophen (PERCOCET/ROXICET) 5-325 MG tablet Take 1 tablet by mouth every 8 (eight) hours as  needed for severe pain. (Patient not taking: Reported on 05/29/2019) 10 tablet 0  . prochlorperazine (COMPAZINE) 10 MG tablet Take 1 tablet (10 mg total) by mouth every 6 (six) hours as needed for nausea or vomiting. (Patient not taking: Reported on 06/04/2019) 30 tablet 1   No current facility-administered medications for this encounter.     Physical Findings: The patient is in no acute distress. Patient is alert and oriented.  weight is 179 lb (81.2 kg). His temperature is 98 F (36.7 C). His blood pressure is 127/79 and his pulse is 140 (abnormal). His respiration is 20 and oxygen saturation is 99%. .    Lungs are clear to auscultation bilaterally. Heart has significantly increased rate  No palpable cervical, supraclavicular, or axillary adenopathy. Abdomen soft, non-tender, normal bowel sounds.  Peripheral pulses are irregular  Lab Findings: Lab Results  Component Value Date   WBC 5.9 06/04/2019   HGB 15.1 06/04/2019   HCT 46.0 06/04/2019   MCV 95.2 06/04/2019   PLT 164 06/04/2019    Radiographic Findings: No results found.  Impression: Patient received good benefit from his palliative radiation therapy directed to the left humerus region.  He has much better range of and essentially no pain in this area.  Plan: .  As needed follow-up in radiation oncology. he will also follow  up with Dr. Julien Nordmann on 06/25/2019 and will have restaging CT scan of chest/abdomen/pelvis done.  Medical oncology was contacted concerning patient's abnormal pulse.  Recommended patient present to the emergency room for further evaluation.  Patient was transferred ported by our nursing staff for evaluation.  ____________________________________   Blair Promise, PhD, MD  This document serves as a record of services personally performed by Gery Pray, MD. It was created on his behalf by Clerance Lav, a trained medical scribe. The creation of this record is based on the scribe's personal observations and  the provider's statements to them. This document has been checked and approved by the attending provider.

## 2019-06-04 NOTE — ED Notes (Signed)
Pt HR is 64 Cardizem medication not given. Pt states " I just held my breath and it went away"

## 2019-06-04 NOTE — Discharge Instructions (Signed)
If you develop recurrent palpitations that do not go away, develop chest pain, shortness of breath, dizziness/lightheadedness, or any other new/concerning symptoms then return to the ER or call 911.  Otherwise you need to follow-up with cardiology.

## 2019-06-04 NOTE — ED Triage Notes (Signed)
Pt arrived POV from Cancer center with CC Tachycardia. Pt denies chest pain or dizzyness.

## 2019-06-04 NOTE — ED Notes (Signed)
Pt verbalizes understanding of DC instructions. Pt belongings returned and is ambulatory out of ED.  

## 2019-06-04 NOTE — Progress Notes (Signed)
Patient is here for a follow-up appointment today. Patient denies any pain. Reports mild fatigue. Patient denies any sob or difficulty with swallowing. Patient states that his appetite is fair. Patient denis any issues with his skin.Patient denies any swelling in his extremities. Patient states that he has numbness in his fingers but that was present before radiation. Patient denis nay nausea or vomiting. Vitals:   06/04/19 1103  BP: 127/79  Pulse: (!) 140  Resp: 20  Temp: 98 F (36.7 C)  SpO2: 99%  Weight: 179 lb (81.2 kg)

## 2019-06-04 NOTE — ED Provider Notes (Addendum)
[facility] DEPT Provider Note   CSN: [id number] Arrival date & time: 06/04/19  1202     History   Chief Complaint Chief Complaint  Patient presents with   Tachycardia    HPI Albert Perez is a 68 y.o. male.     HPI  68 year old male presents with palpitations and high heart rate.  Over the last 3 or 4 weeks he has had 2 episodes of transient palpitations lasting only a few minutes.  Most recently while at a planned doctor visit he noticed recurrent palpitations that have not gone away.  He denies any shortness of breath, chest pain, dizziness.  No recent illness.  Chronically has some diarrhea from his treatment.  Palpitations are improved at this time.  Past Medical History:  Diagnosis Date   A-fib [facility])    PMH: 2000-2001   Arthritis    Cancer ([place])    melanoma 2018   DVT (deep venous thrombosis) (HCC)    Left brachial vein DVT (01/30/19, following fracture)   ETOH abuse    GERD (gastroesophageal reflux disease)    Sleep apnea    do not wear CPAP   Stroke [facility])    " mini"   Vertigo    Wears glasses     Patient Active Problem List   Diagnosis Date Noted   Adenocarcinoma of left lung, stage 4 ([place]) 04/06/2019   Goals of care, counseling/discussion 04/06/2019   Encounter for antineoplastic chemotherapy 04/06/2019   Mass of lower lobe of left lung 03/27/2019   Severe obstructive sleep apnea-hypopnea syndrome 08/18/2018   History of atrial fibrillation 07/19/2018   Snoring 07/19/2018   Essential hypertension 07/19/2018   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 Authorizing Provider  acetaminophen (TYLENOL) 650 MG CR tablet Take 1,300 mg by mouth every 8 (eight) hours as needed for pain.    [provider]  aspirin 81 MG tablet Take 1 tablet (81 mg total) by mouth daily. 11/08/17   Penumalli, Earlean Polka, MD  Cyanocobalamin (B-12 PO) Take 1,000 mg by mouth daily.     [provider]  Omega-3 Fatty Acids (OMEGA-3 PO) Take 1,200 mg by mouth daily.     [provider]  osimertinib mesylate (TAGRISSO) 80 MG tablet Take 1 tablet (80 mg total) by mouth daily. 04/19/19   Heilingoetter, Cassandra L, PA-C  oxyCODONE-acetaminophen (PERCOCET/ROXICET) 5-325 MG tablet Take 1 tablet by mouth every 8 (eight) hours as needed for severe pain. Patient not taking: Reported on 05/29/2019 04/19/19   Heilingoetter, Cassandra L, PA-C  prochlorperazine (COMPAZINE) 10 MG tablet Take 1 tablet (10 mg total) by mouth every 6 (six) hours as needed for nausea or vomiting. Patient not taking: Reported on 06/04/2019 04/19/19   Heilingoetter, Cassandra L, PA-C  rosuvastatin (CRESTOR) 5 MG tablet Take 5 mg by mouth daily. 04/26/18   [provider]    Family History Family History  Problem Relation Age of Onset   Aneurysm Mother    Colon cancer Brother    Prostate cancer Brother     Social History Social History   Tobacco Use   Smoking status: Former Smoker    Packs/day: 0.50    Years: 20.00    Pack years: 10.00    Types: Cigarettes    Quit date: 03/06/2019    Years since quitting: 0.2   Smokeless tobacco: Never Used  Substance Use Topics   Alcohol use: Yes    Alcohol/week: 24.0 standard drinks    Types: 24 Cans of beer per week    Comment: 3 -4 drinks of Vodka daily   Drug use: Yes    Types: Marijuana     Comment: CBD oil     Allergies   Patient has no known allergies.   Review of Systems Review of Systems  Constitutional: Negative for fever.  Respiratory: Negative for shortness of breath.   Cardiovascular: Positive for palpitations. Negative for chest pain.  Gastrointestinal: Negative for diarrhea and vomiting.  Neurological: Negative for dizziness and light-headedness.  All other systems reviewed and are negative.    Physical Exam Updated Vital Signs BP 123/77    Pulse 61    Temp 97.6 F (36.4 C) (Oral)    Resp 14    SpO2 98%   Physical Exam Vitals signs and nursing note reviewed.  Constitutional:      General: He is not in acute distress.    Appearance: He is well-developed. He is not ill-appearing or diaphoretic.  HENT:     Head: Normocephalic and atraumatic.     Right Ear: External ear normal.     Left Ear: External ear normal.     Nose: Nose normal.  Eyes:     General:        Right eye: No discharge.        Left eye: No discharge.  Neck:     Musculoskeletal: Neck supple.  Cardiovascular:     Rate and Rhythm: Regular rhythm. Tachycardia present.     Heart sounds: Normal heart sounds.  Pulmonary:     Effort: Pulmonary effort is normal.     Breath sounds: Normal breath sounds.  Abdominal:     Palpations: Abdomen is soft.     Tenderness: There is no abdominal tenderness.  Skin:    General: Skin is warm and dry.  Neurological:     Mental Status: He is alert.  Psychiatric:        Mood and Affect: Mood is not anxious.      ED Treatments / Results  Labs (all labs ordered are listed, but only abnormal results are displayed) Labs Reviewed  COMPREHENSIVE METABOLIC PANEL - Abnormal; Notable for the following components:      Result Value   CO2 19 (*)    Glucose, Bld 122 (*)    All other components within normal limits  CBC WITH DIFFERENTIAL/PLATELET  MAGNESIUM    EKG EKG Interpretation  Date/Time:  Monday June 04 2019 12:07:53 EST Ventricular  Rate:  152 PR Interval:    QRS Duration: 94 QT Interval:  309 QTC Calculation: 492 R Axis:   56 Text Interpretation: Supraventricular tachycardia possibly aflutter 2:1 ST depression, probably rate related SVT new since Oct 2020 Confirmed by Sherwood Gambler [phone]) on 06/04/2019 12:31:45 PM   EKG Interpretation  Date/Time:  Monday June 04 2019 13:18:28 EST Ventricular Rate:  64 PR Interval:    QRS Duration: 91 QT Interval:  434 QTC Calculation: 448 R Axis:   73 Text Interpretation: Sinus rhythm no acute ST/T changes SVT resolved Confirmed by Sherwood Gambler [phone]) on 06/04/2019 1:26:00 PM       Radiology No results found.  Procedures Procedures (including critical care time)  Medications Ordered in ED Medications  diltiazem (CARDIZEM) injection 20 mg (has no administration in time range)  sodium chloride 0.9 % bolus 1,000 mL (1,000 mLs Intravenous New Bag/Given 06/04/19 1247)     Initial Impression / Assessment and Plan / ED Course  I have reviewed the triage vital signs and the nursing notes.  Pertinent labs & imaging results that were available during my care of the patient were reviewed by me and considered in my medical decision making (see chart for details).        Patient was waiting to get diltiazem for what appeared to be SVT but could have been 2:1 a flutter.  He states that when he was holding his breath and after he was almost done with the fluids he could feel the palpitations go away.  He is now in sinus rhythm.  Given is not obviously clear this was A. fib/a flutter I do not think he needs to immediately go on blood thinners.  He does need to follow-up with cardiology.  We discussed return precautions.  Final Clinical Impressions(s) / ED Diagnoses   Final diagnoses:  SVT (supraventricular tachycardia) [facility])    ED Discharge Orders    None       Sherwood Gambler, MD 06/04/19 1326    Sherwood Gambler, MD 06/04/19 1327

## 2019-06-04 NOTE — Progress Notes (Signed)
Pt with tachycardia, 140 and 160 on recheck. Pt was encouraged to present to [facility] ED for suspected Afib with RVR. Pt agreeable. Pt transported via [place] to [facility] ED per this RN. Report given to [facility], ED RN. Pt's wife Blanch Media notified per this RN. Loma Sousa, RN BSN

## 2019-06-07 ENCOUNTER — Encounter: Payer: Self-pay | Admitting: Internal Medicine

## 2019-06-07 ENCOUNTER — Ambulatory Visit (HOSPITAL_COMMUNITY)
Admission: RE | Admit: 2019-06-07 | Discharge: 2019-06-07 | Disposition: A | Discharge status: Home / Self Care | Payer: Medicare Other | Source: Ambulatory Visit | Attending: Physician Assistant | Admitting: Physician Assistant

## 2019-06-07 ENCOUNTER — Other Ambulatory Visit: Payer: Self-pay

## 2019-06-07 DIAGNOSIS — C3492 Malignant neoplasm of unspecified part of left bronchus or lung: Secondary | ICD-10-CM

## 2019-06-07 MED ORDER — SODIUM CHLORIDE (PF) 0.9 % IJ SOLN
INTRAMUSCULAR | Status: AC
  Filled 2019-06-07: qty 50

## 2019-06-07 MED ORDER — IOHEXOL 300 MG/ML  SOLN
100.0000 mL | Freq: Once | INTRAMUSCULAR | Status: AC | PRN
  Administered 2019-06-07: 100 mL via INTRAVENOUS

## 2019-06-13 ENCOUNTER — Telehealth: Payer: Self-pay | Admitting: Pharmacist

## 2019-06-13 NOTE — Telephone Encounter (Signed)
Oral Chemotherapy Pharmacist Encounter   I called and LVM for the patient to call me back in reference to his AZ&ME patient assistance. He returned me call and let me know that he mail and faxed in the Medicare AZ&ME attestation for 2021 re-enrollment.   We will following up with AZ&ME about his 2021 re-enrollment status.   Darl Pikes, PharmD, BCPS, [facility] Hematology/Oncology Clinical Pharmacist [facility]/AP Oral [facility] [phone]  06/13/2019 11:13 AM

## 2019-06-14 ENCOUNTER — Encounter: Payer: Self-pay | Admitting: Cardiovascular Disease

## 2019-06-14 ENCOUNTER — Other Ambulatory Visit: Payer: Self-pay

## 2019-06-14 ENCOUNTER — Ambulatory Visit: Payer: Medicare Other | Admitting: Cardiovascular Disease

## 2019-06-14 ENCOUNTER — Telehealth: Payer: Self-pay | Admitting: Radiology

## 2019-06-14 DIAGNOSIS — Z720 Tobacco use: Secondary | ICD-10-CM

## 2019-06-14 DIAGNOSIS — I10 Essential (primary) hypertension: Secondary | ICD-10-CM

## 2019-06-14 DIAGNOSIS — Z8679 Personal history of other diseases of the circulatory system: Secondary | ICD-10-CM

## 2019-06-14 DIAGNOSIS — I471 Supraventricular tachycardia: Secondary | ICD-10-CM

## 2019-06-14 DIAGNOSIS — R072 Precordial pain: Secondary | ICD-10-CM | POA: Diagnosis not present

## 2019-06-14 DIAGNOSIS — G4733 Obstructive sleep apnea (adult) (pediatric): Secondary | ICD-10-CM

## 2019-06-14 NOTE — Assessment & Plan Note (Signed)
History of atrial fibrillation back in 2000 that was fairly brief and thought to be related to stress, caffeine and alcohol without recurrence.  He was not anticoagulated

## 2019-06-14 NOTE — Assessment & Plan Note (Signed)
Mr. Laski was recently seen in the emergency room on 06/04/2019 by Dr. Regenia Skeeter.  He was experiencing tachypalpitations but no other symptoms.  This started abruptly for no clear reason.  His EKG showed what appears to be SVT at a heart rate of 152 although it could also clearly be a flutter with 2-1 block.  He converted spontaneously with Valsalva.  He does get similar episodes that are fairly brief in nature on a monthly basis.  I am not convinced this requires anticoagulation.  Going to get a 30-day event monitor to further evaluate.

## 2019-06-14 NOTE — Progress Notes (Signed)
06/14/2019 Albert Perez   01/06/1951  [id number]  Primary Physician Aura Dials, MD Primary Cardiologist: Lorretta Harp MD FACP, [place], [place], [place]  HPI:  Albert Perez is a 68 y.o. 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 Regenia Skeeter for tachypalpitations.  His cardiac risk factors include long history tobacco abuse and treated hyperlipidemia.  There is no family history of heart disease.  He is never had a heart heart attack but did have a stroke in 2018 without neurologic deficits.  He denies chest pain or shortness of breath.  He does lift weights frequently.  He has 2 drinks of alcohol a night.  He smoked a half a pack a day for 50 years although he did stop for 22 years during that time.  He cut back to 3 cigarettes a day 6 months ago and is trying to stop.  He does have a diagnosis of lung cancer.  He does have obstructive sleep apnea but is intolerant to CPAP.  He had a brief episode of A. fib back in 2000 which he relates to increase stress, alcohol and caffeine.  He changed his lifestyle and this resolved.  He was not anticoagulated.  He does admit episodes of tachypalpitations that occur on a monthly basis that are fairly self-limited.  When he was seen in the emergency room for palpitations he appeared to be in SVT versus a flutter with 2 1 block and he converted with Valsalva.  Of note, he did have a chest CT performed on 06/07/2023 lung cancer screening that did show coronary calcification.   Current Meds  Medication Sig  . acetaminophen (TYLENOL) 650 MG CR tablet Take 1,300 mg by mouth every 8 (eight) hours as needed for pain.  Marland Kitchen aspirin 81 MG tablet Take 1 tablet (81 mg total) by mouth daily.  . Cyanocobalamin (B-12 PO) Take 1,000 mg by mouth daily.   . Multiple Vitamin (MULTIVITAMIN PO) Take by mouth daily. Centrum   . Omega-3 Fatty Acids (OMEGA-3 PO) Take 1,200 mg by mouth daily.   Marland Kitchen osimertinib mesylate (TAGRISSO) 80 MG tablet Take 1 tablet (80 mg total) by mouth daily.  . rosuvastatin (CRESTOR) 5 MG tablet Take 5 mg by mouth daily.     No Known Allergies  Social History   Socioeconomic History  . Marital status: Married    Spouse name: Not on file  . Number of children: 2  . Years of education: 78  . Highest education level: Some college, no degree  Occupational History  . Not on file  Tobacco Use  . Smoking status: Former Smoker    Packs/day: 0.50    Years: 20.00    Pack years: 10.00    Types: Cigarettes    Quit date: 03/06/2019    Years since quitting: 0.2  . Smokeless tobacco: Never Used  Substance and Sexual Activity  . Alcohol use: Yes    Alcohol/week: 24.0 standard drinks    Types: 24 Cans of beer per week    Comment: 3 -4 drinks of Vodka daily  . Drug use: Yes    Types: Marijuana    Comment: CBD oil  . Sexual activity: Not on file  Other Topics Concern  . Not on file  Social History Narrative   Lives at home with wife, daughter and grandson  Right handed   Caffeine: sodas 2 per day   Social Determinants of Health   Financial Resource Strain:   . Difficulty of Paying Living Expenses: Not on file  Food Insecurity:   . Worried About Charity fundraiser in the Last Year: Not on file  . Ran Out of Food in the Last Year: Not on file  Transportation Needs:   . Lack of Transportation (Medical): Not on file  . Lack of Transportation (Non-Medical): Not on file  Physical Activity:   . Days of Exercise per Week: Not on file  . Minutes of Exercise per Session: Not on file  Stress:   . Feeling of Stress : Not on file  Social Connections:   . Frequency of Communication with Friends and Family: Not on file  . Frequency of Social Gatherings with Friends and Family: Not on file  . Attends Religious Services: Not on file  . Active Member of Clubs or Organizations: Not on file   . Attends Archivist Meetings: Not on file  . Marital Status: Not on file  Intimate Partner Violence:   . Fear of Current or Ex-Partner: Not on file  . Emotionally Abused: Not on file  . Physically Abused: Not on file  . Sexually Abused: Not on file     Review of Systems: General: negative for chills, fever, night sweats or weight changes.  Cardiovascular: negative for chest pain, dyspnea on exertion, edema, orthopnea, palpitations, paroxysmal nocturnal dyspnea or shortness of breath Dermatological: negative for rash Respiratory: negative for cough or wheezing Urologic: negative for hematuria Abdominal: negative for nausea, vomiting, diarrhea, bright red blood per rectum, melena, or hematemesis Neurologic: negative for visual changes, syncope, or dizziness All other systems reviewed and are otherwise negative except as noted above.    Blood pressure 133/64, pulse (!) 58, height 5\' 7"  (1.702 m), weight 180 lb 12.8 oz (82 kg).  General appearance: alert and no distress Neck: no adenopathy, no carotid bruit, no JVD, supple, symmetrical, trachea midline and thyroid not enlarged, symmetric, no tenderness/mass/nodules Lungs: clear to auscultation bilaterally Heart: regular rate and rhythm, S1, S2 normal, no murmur, click, rub or gallop Extremities: extremities normal, atraumatic, no cyanosis or edema Pulses: 2+ and symmetric Skin: Skin color, texture, turgor normal. No rashes or lesions Neurologic: Alert and oriented X 3, normal strength and tone. Normal symmetric reflexes. Normal coordination and gait  EKG not performed today  ASSESSMENT AND PLAN:   History of atrial fibrillation History of atrial fibrillation back in 2000 that was fairly brief and thought to be related to stress, caffeine and alcohol without recurrence.  He was not anticoagulated  Essential hypertension History of essential potential blood pressure measured today 133/64.  He is not on antihypertensive  medications.  Severe obstructive sleep apnea-hypopnea syndrome History of obstructive sleep apnea not on CPAP because he cannot tolerate it.  Tobacco abuse Long history tobacco abuse having smoked a half a pack a day for 50 years although in the middle that he had stopped for 22 years.  He cut back to 3 6 cigarettes a day 6 months ago and is trying to stop.  He does have a history of lung cancer.  SVT (supraventricular tachycardia) [facility]) Mr. Fussell was recently seen in the emergency room on 06/04/2019 by Dr. Regenia Skeeter.  He was experiencing tachypalpitations but no other symptoms.  This started abruptly for no clear reason.  His EKG showed what appears to be SVT at a heart rate of  152 although it could also clearly be a flutter with 2-1 block.  He converted spontaneously with Valsalva.  He does get similar episodes that are fairly brief in nature on a monthly basis.  I am not convinced this requires anticoagulation.  Going to get a 30-day event monitor to further evaluate.      Lorretta Harp MD FACP,FACC,FAHA, [facility] 06/14/2019 9:10 AM

## 2019-06-14 NOTE — Telephone Encounter (Signed)
Enrolled patient for a 30 [employer] Event monitor to be mailed to patients home.

## 2019-06-14 NOTE — Patient Instructions (Addendum)
Medication Instructions:  Your physician recommends that you continue on your current medications as directed. Please refer to the Current Medication list given to you today.  If you need a refill on your cardiac medications before your next appointment, please call your pharmacy.   Lab work: NONE  Testing/Procedures: Your physician has recommended that you wear an event monitor. Event monitors are medical devices that record the heart's electrical activity. Doctors most often [place] these monitors to diagnose arrhythmias. Arrhythmias are problems with the speed or rhythm of the heartbeat. The monitor is a small, portable device. You can wear one while you do your normal daily activities. This is usually used to diagnose what is causing palpitations/syncope (passing out).  AND  Coronary Calcium Score  Follow-Up: At [facility], you and your health needs are our priority.  As part of our continuing mission to provide you with exceptional heart care, we have created designated Provider Care Teams.  These Care Teams include your primary Cardiologist (physician) and Advanced Practice Providers (APPs -  Physician Assistants and Nurse Practitioners) who all work together to provide you with the care you need, when you need it. You may see Dr Gwenlyn Found or one of the following Advanced Practice Providers on your designated Care Team:    Kerin Ransom, PA-C  [place], [place]  Coletta Memos, [place]  Your physician wants you to follow-up in: 6 months   Any Other Special Instructions Will Be Listed Below (If Applicable).   [employer] Cardiac Event Monitor Instructions Your physician has requested you wear your cardiac event monitor for 30 days, (1-30). [employer] may call or text to confirm a shipping address. The monitor will be sent to a land address via UPS. [employer] will not ship a monitor to a PO BOX. It typically takes 3-5 days to receive your monitor after it has been enrolled. [employer]  will assist with USPS tracking if your package is delayed. The telephone number for [employer] is [phone]. Once you have received your monitor, please review the enclosed instructions. Instruction tutorials can also be viewed under help and settings on the enclosed cell phone. Your monitor has already been registered assigning a specific monitor serial # to you.  Applying the monitor Remove cell phone from case and turn it on. The cell phone works as Dealer and needs to be within [employer] of you at all times. The cell phone will need to be charged on a daily basis. We recommend you plug the cell phone into the enclosed charger at your bedside table every night.  Monitor batteries: You will receive two monitor batteries labelled #1 and #2. These are your recorders. Plug battery #2 onto the second connection on the enclosed charger. Keep one battery on the charger at all times. This will keep the monitor battery deactivated. It will also keep it fully charged for when you need to switch your monitor batteries. A small light will be blinking on the battery emblem when it is charging. The light on the battery emblem will remain on when the battery is fully charged.  Open package of a Monitor strip. Insert battery #1 into black hood on strip and gently squeeze monitor battery onto connection as indicated in instruction booklet. Set aside while preparing skin.  Choose location for your strip, vertical or horizontal, as indicated in the instruction booklet. Shave to remove all hair from location. There cannot be any lotions, oils, powders, or colognes on skin where monitor is to be applied. Wipe  skin clean with enclosed Saline wipe. Dry skin completely.  Peel paper labeled #1 off the back of the Monitor strip exposing the adhesive. Place the monitor on the chest in the vertical or horizontal position shown in the instruction booklet. One arrow on the monitor strip must be pointing  upward. Carefully remove paper labeled #2, attaching remainder of strip to your skin. Try not to create any folds or wrinkles in the strip as you apply it.  Firmly press and release the circle in the center of the monitor battery. You will hear a small beep. This is turning the monitor battery on. The heart emblem on the monitor battery will light up every 5 seconds if the monitor battery in turned on and connected to the patient securely. Do not push and hold the circle down as this turns the monitor battery off. The cell phone will locate the monitor battery. A screen will appear on the cell phone checking the connection of your monitor strip. This may read poor connection initially but change to good connection within the next minute. Once your monitor accepts the connection you will hear a series of 3 beeps followed by a climbing crescendo of beeps. A screen will appear on the cell phone showing the two monitor strip placement options. Touch the picture that demonstrates where you applied the monitor strip.  Your monitor strip and battery are waterproof. You are able to shower, bathe, or swim with the monitor on. They just ask you do not submerge deeper than 3 feet underwater. We recommend removing the monitor if you are swimming in a lake, river, or ocean.  Your monitor battery will need to be switched to a fully charged monitor battery approximately once a week. The cell phone will alert you of an action which needs to be made.  On the cell phone, tap for details to reveal connection status, monitor battery status, and cell phone battery status. The green dots indicates your monitor is in good status. A red dot indicates there is something that needs your attention.  To record a symptom, click the circle on the monitor battery. In 30-60 seconds a list of symptoms will appear on the cell phone. Select your symptom and tap save. Your monitor will record a sustained or  significant arrhythmia regardless of you clicking the button. Some patients do not feel the heart rhythm irregularities. [employer] will notify us of any serious or critical events.  Refer to instruction booklet for instructions on switching batteries, changing strips, the Do not disturb or Pause features, or any additional questions.  Call [employer] at [phone], to confirm your monitor is transmitting and record your baseline. They will answer any questions you may have regarding the monitor instructions at that time.  Returning the monitor to [place] all equipment back into blue box. Peel off strip of paper to expose adhesive and close box securely. There is a prepaid UPS shipping label on this box. Drop in a UPS drop box, or at a UPS facility like [employer]. You may also contact [employer] to arrange UPS to pick up monitor package at your home.   Coronary Calcium Scan A coronary calcium scan is an imaging test used to look for deposits of calcium and other fatty materials (plaques) in the inner lining of the blood vessels of the heart (coronary arteries). These deposits of calcium and plaques can partly clog and narrow the coronary arteries without producing any symptoms or warning signs. This puts a person at  risk for a heart attack. This test can detect these deposits before symptoms develop. Tell a health care provider about:  Any allergies you have.  All medicines you are taking, including vitamins, herbs, eye drops, creams, and over-the-counter medicines.  Any problems you or family members have had with anesthetic medicines.  Any blood disorders you have.  Any surgeries you have had.  Any medical conditions you have.  Whether you are pregnant or may be pregnant. What are the risks? Generally, this is a safe procedure. However, problems may occur, including:  Harm to a pregnant woman and her unborn baby. This test involves the use of radiation. Radiation  exposure can be dangerous to a pregnant woman and her unborn baby. If you are pregnant, you generally should not have this procedure done.  Slight increase in the risk of cancer. This is because of the radiation involved in the test. What happens before the procedure? No preparation is needed for this procedure. What happens during the procedure?   You will undress and remove any jewelry around your neck or chest.  You will put on a hospital gown.  Sticky electrodes will be placed on your chest. The electrodes will be connected to an electrocardiogram (ECG) machine to record a tracing of the electrical activity of your heart.  A CT scanner will take pictures of your heart. During this time, you will be asked to lie still and hold your breath for 2-3 seconds while a picture of your heart is being taken. The procedure may vary among health care providers and hospitals. What happens after the procedure?  You can get dressed.  You can return to your normal activities.  It is up to you to get the results of your test. Ask your health care provider, or the department that is doing the test, when your results will be ready. Summary  A coronary calcium scan is an imaging test used to look for deposits of calcium and other fatty materials (plaques) in the inner lining of the blood vessels of the heart (coronary arteries).  Generally, this is a safe procedure. Tell your health care provider if you are pregnant or may be pregnant.  No preparation is needed for this procedure.  A CT scanner will take pictures of your heart.  You can return to your normal activities after the scan is done. This information is not intended to replace advice given to you by your health care provider. Make sure you discuss any questions you have with your health care provider. Document Released: 12/18/2007 Document Revised: 06/03/2017 Document Reviewed: 05/10/2016 Elsevier Patient Education  2020 [employer].

## 2019-06-14 NOTE — Assessment & Plan Note (Signed)
Long history tobacco abuse having smoked a half a pack a day for 50 years although in the middle that he had stopped for 22 years.  He cut back to 3 6 cigarettes a day 6 months ago and is trying to stop.  He does have a history of lung cancer.

## 2019-06-14 NOTE — Assessment & Plan Note (Signed)
History of obstructive sleep apnea not on CPAP because he cannot tolerate it.

## 2019-06-14 NOTE — Assessment & Plan Note (Signed)
History of essential potential blood pressure measured today 133/64.  He is not on antihypertensive medications.

## 2019-06-18 ENCOUNTER — Encounter: Payer: Self-pay | Admitting: Internal Medicine

## 2019-06-20 ENCOUNTER — Telehealth: Payer: Self-pay | Admitting: Internal Medicine

## 2019-06-20 NOTE — Telephone Encounter (Signed)
R/s appt per 12/16 sch message - unable to reach pt . Left message with appt date and time

## 2019-06-21 ENCOUNTER — Inpatient Hospital Stay: Payer: Medicare Other

## 2019-06-25 ENCOUNTER — Inpatient Hospital Stay: Payer: Medicare Other | Attending: Internal Medicine | Admitting: Internal Medicine

## 2019-06-25 ENCOUNTER — Inpatient Hospital Stay: Payer: Medicare Other

## 2019-06-25 ENCOUNTER — Ambulatory Visit (INDEPENDENT_AMBULATORY_CARE_PROVIDER_SITE_OTHER)
Admission: RE | Admit: 2019-06-25 | Discharge: 2019-06-25 | Disposition: A | Discharge status: Home / Self Care | Payer: Self-pay | Source: Ambulatory Visit | Attending: Cardiovascular Disease | Admitting: Cardiovascular Disease

## 2019-06-25 ENCOUNTER — Encounter: Payer: Self-pay | Admitting: Internal Medicine

## 2019-06-25 ENCOUNTER — Other Ambulatory Visit: Payer: Self-pay

## 2019-06-25 VITALS — BP 147/60 | HR 61 | Temp 98.2°F | Resp 17 | Ht 67.0 in | Wt 177.6 lb

## 2019-06-25 DIAGNOSIS — Z5111 Encounter for antineoplastic chemotherapy: Secondary | ICD-10-CM | POA: Diagnosis not present

## 2019-06-25 DIAGNOSIS — G4730 Sleep apnea, unspecified: Secondary | ICD-10-CM | POA: Insufficient documentation

## 2019-06-25 DIAGNOSIS — Z8673 Personal history of transient ischemic attack (TIA), and cerebral infarction without residual deficits: Secondary | ICD-10-CM | POA: Insufficient documentation

## 2019-06-25 DIAGNOSIS — I4891 Unspecified atrial fibrillation: Secondary | ICD-10-CM | POA: Diagnosis not present

## 2019-06-25 DIAGNOSIS — Z86718 Personal history of other venous thrombosis and embolism: Secondary | ICD-10-CM | POA: Diagnosis not present

## 2019-06-25 DIAGNOSIS — C3432 Malignant neoplasm of lower lobe, left bronchus or lung: Secondary | ICD-10-CM | POA: Insufficient documentation

## 2019-06-25 DIAGNOSIS — I10 Essential (primary) hypertension: Secondary | ICD-10-CM

## 2019-06-25 DIAGNOSIS — Z7982 Long term (current) use of aspirin: Secondary | ICD-10-CM | POA: Insufficient documentation

## 2019-06-25 DIAGNOSIS — C7951 Secondary malignant neoplasm of bone: Secondary | ICD-10-CM | POA: Insufficient documentation

## 2019-06-25 DIAGNOSIS — Z79899 Other long term (current) drug therapy: Secondary | ICD-10-CM | POA: Insufficient documentation

## 2019-06-25 DIAGNOSIS — C3492 Malignant neoplasm of unspecified part of left bronchus or lung: Secondary | ICD-10-CM | POA: Diagnosis not present

## 2019-06-25 DIAGNOSIS — R072 Precordial pain: Secondary | ICD-10-CM

## 2019-06-25 DIAGNOSIS — Z85820 Personal history of malignant melanoma of skin: Secondary | ICD-10-CM | POA: Insufficient documentation

## 2019-06-25 LAB — CBC WITH DIFFERENTIAL (CANCER CENTER ONLY)
Abs Immature Granulocytes: 0.02 K/uL (ref 0.00–0.07)
Basophils Absolute: 0 K/uL (ref 0.0–0.1)
Basophils Relative: 1 %
Eosinophils Absolute: 0.1 K/uL (ref 0.0–0.5)
Eosinophils Relative: 1 %
HCT: 42 % (ref 39.0–52.0)
Hemoglobin: 13.8 g/dL (ref 13.0–17.0)
Immature Granulocytes: 0 %
Lymphocytes Relative: 21 %
Lymphs Abs: 1.2 K/uL (ref 0.7–4.0)
MCH: 31.7 pg (ref 26.0–34.0)
MCHC: 32.9 g/dL (ref 30.0–36.0)
MCV: 96.6 fL (ref 80.0–100.0)
Monocytes Absolute: 0.8 K/uL (ref 0.1–1.0)
Monocytes Relative: 13 %
Neutro Abs: 3.7 K/uL (ref 1.7–7.7)
Neutrophils Relative %: 64 %
Platelet Count: 153 K/uL (ref 150–400)
RBC: 4.35 MIL/uL (ref 4.22–5.81)
RDW: 13.9 % (ref 11.5–15.5)
WBC Count: 5.7 K/uL (ref 4.0–10.5)
nRBC: 0 % (ref 0.0–0.2)

## 2019-06-25 LAB — CMP (CANCER CENTER ONLY)
ALT: 16 U/L (ref 0–44)
AST: 18 U/L (ref 15–41)
Albumin: 3.7 g/dL (ref 3.5–5.0)
Alkaline Phosphatase: 93 U/L (ref 38–126)
Anion gap: 8 (ref 5–15)
BUN: 15 mg/dL (ref 8–23)
CO2: 26 mmol/L (ref 22–32)
Calcium: 9 mg/dL (ref 8.9–10.3)
Chloride: 106 mmol/L (ref 98–111)
Creatinine: 1.21 mg/dL (ref 0.61–1.24)
GFR, Est AFR Am: >60 mL/min (ref >60)
GFR, Estimated: >60 mL/min (ref >60)
Glucose, Bld: 109 mg/dL — ABNORMAL HIGH (ref 70–99)
Potassium: 4.9 mmol/L (ref 3.5–5.1)
Sodium: 140 mmol/L (ref 135–145)
Total Bilirubin: 0.7 mg/dL (ref 0.3–1.2)
Total Protein: 7 g/dL (ref 6.5–8.1)

## 2019-06-25 NOTE — Progress Notes (Signed)
[facility] Telephone:([phone]   [phone]  OFFICE PROGRESS NOTE  Albert Dials, MD [place] [address]  DIAGNOSIS: Stage IV (T3, N0, M1c) non-small cell lung cancer, adenocarcinoma presented with cavitary left lower lobe lung mass in addition to other pulmonary nodules and scattered hypermetabolic osseous metastatic disease involving the spine, ribs and bony pelvis as well as left proximal humeral pathologic fracture.   Molecular Biomarkers performed by [facility](S) / [name]      [id number]  Afatinib, Dacomitinib, Erlotinib, Gefitinib, Osimertinib, Ramucirumab Neratinib  FGFR1Amplification  Erdafitinib, Lenvatinib, Nintedanib, Pazopanib, Pemigatinib, Ponatinib  [id number] None  PRIOR THERAPY: None  CURRENT THERAPY:  1) Palliative radiotherapy to the the left humerus under the care of Dr. Sondra Come. Last treatment expected on 04/24/2019 2) Targeted therapy with Tagrisso 80 p.o. daily.  He started the first dose on April 21, 2019.  Status post 2 months of treatment.  INTERVAL HISTORY: Albert Perez 68 y.o. male returns to the clinic today for follow-up visit.  The patient is feeling fine today with no concerning complaints.  He continues to tolerate his treatment with Tagrisso fairly well.  Has few areas of itching and rash improved with topical creams.  He denied having any current chest pain, shortness of breath, cough or hemoptysis.  He denied having any fever or chills.  He has no nausea, vomiting, diarrhea or constipation.  He has no headache or visual changes.  The patient had repeat CT scan of the chest, abdomen pelvis performed recently and he is here for evaluation and discussion of his discuss results.  MEDICAL HISTORY: Past Medical History:  Diagnosis Date   A-fib [facility])    PMH: 2000-2001   Arthritis    Cancer ([facility] [facility])    melanoma 2018   DVT (deep venous thrombosis)  ([facility])    Left brachial vein DVT (01/30/19, following fracture)   ETOH abuse    GERD (gastroesophageal reflux disease)    Sleep apnea    do not wear CPAP   Stroke ([place])    " mini"   Vertigo    Wears glasses     ALLERGIES:  has No Known Allergies.  MEDICATIONS:  Current Outpatient Medications  Medication Sig Dispense Refill   acetaminophen (TYLENOL) 650 MG CR tablet Take 1,300 mg by mouth every 8 (eight) hours as needed for pain.     aspirin 81 MG tablet Take 1 tablet (81 mg total) by mouth daily.     Cyanocobalamin (B-12 PO) Take 1,000 mg by mouth daily.      Multiple Vitamin (MULTIVITAMIN PO) Take by mouth daily. Centrum     Omega-3 Fatty Acids (OMEGA-3 PO) Take 1,200 mg by mouth daily.      osimertinib mesylate (TAGRISSO) 80 MG tablet Take 1 tablet (80 mg total) by mouth daily. 30 tablet 2   rosuvastatin (CRESTOR) 5 MG tablet Take 5 mg by mouth daily.  2   No current facility-administered medications for this visit.    SURGICAL HISTORY:  Past Surgical History:  Procedure Laterality Date   ANKLE FRACTURE SURGERY Right 2003   COLONOSCOPY  2018   ELBOW SURGERY Left 1964   melanoma removal  04/2017   Lt shoulder   RETINAL DETACHMENT SURGERY Right 1971   TONSILLECTOMY     VIDEO BRONCHOSCOPY WITH ENDOBRONCHIAL NAVIGATION N/A 03/28/2019   Procedure: VIDEO BRONCHOSCOPY WITH ENDOBRONCHIAL NAVIGATION and Biopsies;  Surgeon: Collene Gobble, MD;  Location: [facility] OR;  Service: Thoracic;  Laterality: N/A;    REVIEW OF SYSTEMS:  Constitutional: negative Eyes: negative Ears, nose, mouth, throat, and face: negative Respiratory: negative Cardiovascular: negative Gastrointestinal: negative Genitourinary:negative Integument/breast: negative Hematologic/lymphatic: negative Musculoskeletal:negative Neurological: negative Behavioral/Psych: negative Endocrine: negative Allergic/Immunologic: negative   PHYSICAL EXAMINATION: General appearance: alert, cooperative and  no distress Head: Normocephalic, without obvious abnormality, atraumatic Neck: no adenopathy, no JVD, supple, symmetrical, trachea midline and thyroid not enlarged, symmetric, no tenderness/mass/nodules Lymph nodes: Cervical, supraclavicular, and axillary nodes normal. Resp: clear to auscultation bilaterally Back: symmetric, no curvature. ROM normal. No CVA tenderness. Cardio: regular rate and rhythm, S1, S2 normal, no murmur, click, rub or gallop GI: soft, non-tender; bowel sounds normal; no masses,  no organomegaly Extremities: extremities normal, atraumatic, no cyanosis or edema Neurologic: Alert and oriented X 3, normal strength and tone. Normal symmetric reflexes. Normal coordination and gait  ECOG PERFORMANCE STATUS: 1 - Symptomatic but completely ambulatory  Blood pressure (!) 147/60, pulse 61, temperature 98.2 F (36.8 C), temperature source Temporal, resp. rate 17, height '5\' 7"'$  (1.702 m), weight 177 lb 9.6 oz (80.6 kg), SpO2 99 %.  LABORATORY DATA: Lab Results  Component Value Date   WBC 5.7 06/25/2019   HGB 13.8 06/25/2019   HCT 42.0 06/25/2019   MCV 96.6 06/25/2019   PLT 153 06/25/2019      Chemistry      Component Value Date/Time   NA 140 06/25/2019 1148   K 4.9 06/25/2019 1148   CL 106 06/25/2019 1148   CO2 26 06/25/2019 1148   BUN 15 06/25/2019 1148   CREATININE 1.21 06/25/2019 1148      Component Value Date/Time   CALCIUM 9.0 06/25/2019 1148   ALKPHOS 93 06/25/2019 1148   AST 18 06/25/2019 1148   ALT 16 06/25/2019 1148   BILITOT 0.7 06/25/2019 1148       RADIOGRAPHIC STUDIES: CT Chest W Contrast  Result Date: 06/07/2019 CLINICAL DATA:  Lung cancer.  Restaging. EXAM: CT CHEST, ABDOMEN, AND PELVIS WITH CONTRAST TECHNIQUE: Multidetector CT imaging of the chest, abdomen and pelvis was performed following the standard protocol during bolus administration of intravenous contrast. CONTRAST:  126m OMNIPAQUE IOHEXOL 300 MG/ML  SOLN COMPARISON:  PET-CT  04/04/2019. chest CT 02/21/2019 FINDINGS: CT CHEST FINDINGS Cardiovascular: The heart size is normal. No substantial pericardial effusion. Coronary artery calcification is evident. Mediastinum/Nodes: No mediastinal lymphadenopathy. 8 mm short axis upper normal prevascular node is similar to prior. There is no hilar lymphadenopathy. The esophagus has normal imaging features. There is no axillary lymphadenopathy. Lungs/Pleura: Right middle lobe calcified granuloma. 2.1 x 1.2 cm lobular lesion in the central right middle lobe on 77/6 is similar to prior. This lesion shows hypermetabolism on prior PET-CT. 7 mm nodule medial right lower lobe on 110/6 was 6 mm previously. Interval decrease in size of the left lower lobe pulmonary lesion, now measuring approximately 2.1 x 1.3 cm and incorporated into volume loss and scarring consistent with radiation induced fibrosis. Substantial ground-glass attenuation seen adjacent to this lesion on the prior studies has resolved. 8 mm anterior left upper lobe nodule on 68/6 is similar to prior. No pleural effusion. Musculoskeletal: 2.2 cm left T8 lytic lesion is similar to 04/04/2019. Other thoracic spine lesion show no substantial change period CT ABDOMEN PELVIS FINDINGS Hepatobiliary: No suspicious focal abnormality within the liver parenchyma. There is no evidence for gallstones, gallbladder wall thickening, or pericholecystic fluid. No intrahepatic or extrahepatic biliary dilation. Pancreas: No focal mass lesion. No  dilatation of the main duct. No intraparenchymal cyst. No peripancreatic edema. Spleen: No splenomegaly. No focal mass lesion. Adrenals/Urinary Tract: No adrenal nodule or mass. Similar 1.8 cm right renal cyst. Left kidney unremarkable. No evidence for hydroureter. The urinary bladder appears normal for the degree of distention. Stomach/Bowel: Stomach is unremarkable. No gastric wall thickening. No evidence of outlet obstruction. Duodenum is normally positioned as is  the ligament of Treitz. No small bowel wall thickening. No small bowel dilatation. The terminal ileum is normal. The appendix is normal. No gross colonic mass. No colonic wall thickening. Vascular/Lymphatic: There is abdominal aortic atherosclerosis without aneurysm. There is no gastrohepatic or hepatoduodenal ligament lymphadenopathy. No intraperitoneal or retroperitoneal lymphadenopathy. No pelvic sidewall lymphadenopathy. Similar appearance of prominent lymph nodes in the groin regions bilaterally. Reproductive: The prostate gland and seminal vesicles are unremarkable. Other: No intraperitoneal free fluid. Musculoskeletal: Small bilateral groin hernias contain only fat. Interval development of sclerotic lesion posterior right iliac bone an area of lucency seen previously. This may be related to healing. Similar new sclerosis in the left iliac crest could reflect an area of treatment related healing. Lucent lesions in the L3 vertebral body are similar. IMPRESSION: 1. Interval decrease in size of the left lower lobe pulmonary nodule with of all vein volume loss and scarring compatible with radiation fibrosis. 2. Stable lobular 2.1 x 1.2 cm lesion in the central right middle lobe, seen to be hypermetabolic on previous PET-CT. 3. 7 mm nodule medial right lower lobe minimally progressive in the interval. Close attention on follow-up recommended. 4. Lytic and sclerotic bone lesions in the thoracolumbar spine and bony pelvis. Some of the pelvic lesions of become sclerotic in the interval, potentially reflecting treatment related healing. 5.  Aortic Atherosclerois (ICD10-170.0) Electronically Signed   By: Misty Stanley M.D.   On: 06/07/2019 17:59   CT Abdomen Pelvis W Contrast  Result Date: 06/07/2019 CLINICAL DATA:  Lung cancer.  Restaging. EXAM: CT CHEST, ABDOMEN, AND PELVIS WITH CONTRAST TECHNIQUE: Multidetector CT imaging of the chest, abdomen and pelvis was performed following the standard protocol during bolus  administration of intravenous contrast. CONTRAST:  174m OMNIPAQUE IOHEXOL 300 MG/ML  SOLN COMPARISON:  PET-CT 04/04/2019. chest CT 02/21/2019 FINDINGS: CT CHEST FINDINGS Cardiovascular: The heart size is normal. No substantial pericardial effusion. Coronary artery calcification is evident. Mediastinum/Nodes: No mediastinal lymphadenopathy. 8 mm short axis upper normal prevascular node is similar to prior. There is no hilar lymphadenopathy. The esophagus has normal imaging features. There is no axillary lymphadenopathy. Lungs/Pleura: Right middle lobe calcified granuloma. 2.1 x 1.2 cm lobular lesion in the central right middle lobe on 77/6 is similar to prior. This lesion shows hypermetabolism on prior PET-CT. 7 mm nodule medial right lower lobe on 110/6 was 6 mm previously. Interval decrease in size of the left lower lobe pulmonary lesion, now measuring approximately 2.1 x 1.3 cm and incorporated into volume loss and scarring consistent with radiation induced fibrosis. Substantial ground-glass attenuation seen adjacent to this lesion on the prior studies has resolved. 8 mm anterior left upper lobe nodule on 68/6 is similar to prior. No pleural effusion. Musculoskeletal: 2.2 cm left T8 lytic lesion is similar to 04/04/2019. Other thoracic spine lesion show no substantial change period CT ABDOMEN PELVIS FINDINGS Hepatobiliary: No suspicious focal abnormality within the liver parenchyma. There is no evidence for gallstones, gallbladder wall thickening, or pericholecystic fluid. No intrahepatic or extrahepatic biliary dilation. Pancreas: No focal mass lesion. No dilatation of the main duct. No intraparenchymal cyst. No  peripancreatic edema. Spleen: No splenomegaly. No focal mass lesion. Adrenals/Urinary Tract: No adrenal nodule or mass. Similar 1.8 cm right renal cyst. Left kidney unremarkable. No evidence for hydroureter. The urinary bladder appears normal for the degree of distention. Stomach/Bowel: Stomach is  unremarkable. No gastric wall thickening. No evidence of outlet obstruction. Duodenum is normally positioned as is the ligament of Treitz. No small bowel wall thickening. No small bowel dilatation. The terminal ileum is normal. The appendix is normal. No gross colonic mass. No colonic wall thickening. Vascular/Lymphatic: There is abdominal aortic atherosclerosis without aneurysm. There is no gastrohepatic or hepatoduodenal ligament lymphadenopathy. No intraperitoneal or retroperitoneal lymphadenopathy. No pelvic sidewall lymphadenopathy. Similar appearance of prominent lymph nodes in the groin regions bilaterally. Reproductive: The prostate gland and seminal vesicles are unremarkable. Other: No intraperitoneal free fluid. Musculoskeletal: Small bilateral groin hernias contain only fat. Interval development of sclerotic lesion posterior right iliac bone an area of lucency seen previously. This may be related to healing. Similar new sclerosis in the left iliac crest could reflect an area of treatment related healing. Lucent lesions in the L3 vertebral body are similar. IMPRESSION: 1. Interval decrease in size of the left lower lobe pulmonary nodule with of all vein volume loss and scarring compatible with radiation fibrosis. 2. Stable lobular 2.1 x 1.2 cm lesion in the central right middle lobe, seen to be hypermetabolic on previous PET-CT. 3. 7 mm nodule medial right lower lobe minimally progressive in the interval. Close attention on follow-up recommended. 4. Lytic and sclerotic bone lesions in the thoracolumbar spine and bony pelvis. Some of the pelvic lesions of become sclerotic in the interval, potentially reflecting treatment related healing. 5.  Aortic Atherosclerois (ICD10-170.0) Electronically Signed   By: Misty Stanley M.D.   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  He is status post palliative radiotherapy to the metastatic disease in the left arm. The patient is currently undergoing treatment with target therapy with Tagrisso 80 mg p.o. daily started 2 months ago. He has been tolerating this treatment well with no concerning adverse effect except for mild rash and itching. He had repeat CT scan of the chest, abdomen pelvis performed recently.  I personally and independently reviewed the scans and discussed the results with the patient today. Has a scan showed no concerning findings for disease progression and there was improvement of his disease. I recommended for the patient to continue his current treatment with Tagrisso with the same dose. For the itching and rash he will continue with the topical creams and that is currently very mild. I will see him back for follow-up visit in 1 months for evaluation with repeat blood work. He was advised to call immediately if he has any concerning symptoms in the interval. The patient voices understanding of current disease status and treatment options and is in agreement with the current care plan. All questions were answered. The patient knows to call the clinic with any problems, questions or concerns. We can certainly see the patient much sooner if necessary.   Disclaimer: This note was dictated with voice recognition software. Similar sounding  words can inadvertently be transcribed and may not be corrected upon review.

## 2019-06-26 ENCOUNTER — Encounter (INDEPENDENT_AMBULATORY_CARE_PROVIDER_SITE_OTHER): Payer: Medicare Other

## 2019-06-26 ENCOUNTER — Encounter: Payer: Self-pay | Admitting: Cardiovascular Disease

## 2019-06-26 ENCOUNTER — Other Ambulatory Visit: Payer: Self-pay | Admitting: *Deleted

## 2019-06-26 DIAGNOSIS — Z8679 Personal history of other diseases of the circulatory system: Secondary | ICD-10-CM | POA: Diagnosis not present

## 2019-06-26 DIAGNOSIS — R002 Palpitations: Secondary | ICD-10-CM

## 2019-06-26 DIAGNOSIS — R931 Abnormal findings on diagnostic imaging of heart and coronary circulation: Secondary | ICD-10-CM

## 2019-06-27 ENCOUNTER — Telehealth: Payer: Self-pay | Admitting: Internal Medicine

## 2019-06-27 ENCOUNTER — Telehealth: Payer: Self-pay

## 2019-06-27 NOTE — Telephone Encounter (Signed)
He's had SVT before. Glad that we may have captured this

## 2019-06-27 NOTE — Telephone Encounter (Signed)
Scheduled  Per los. Called and spoke with patient. Confirmed appt

## 2019-06-27 NOTE — Telephone Encounter (Signed)
Received telephone call from [place] with [employer] that patient is in SVT at a rate of 160-162 bpm, patient was in SR. Still awaiting cardiac strip to be sent over.   Spoke with patient. Patient reports he had a fluttering feeling in his chest about an hour ago but that it has resolved. Patient reports he has no dizziness, no n/v, no shortness of breath, no sweating and no chest pain.

## 2019-06-27 NOTE — Telephone Encounter (Signed)
telemetry strip reviewed by D.O.D. and placed to be scanned into chart.

## 2019-06-28 ENCOUNTER — Telehealth: Payer: Self-pay

## 2019-06-28 ENCOUNTER — Encounter: Payer: Self-pay | Admitting: Cardiovascular Disease

## 2019-06-28 NOTE — Telephone Encounter (Signed)
Received a call from [employer] to inform MD that monitor showed pt was in afib, then around 3 am pt had 4 runs of VT (HR 162). Nurse attempted to contact pt. Left message to call back.   Awaiting strip to be reviewed by DOD.

## 2019-06-28 NOTE — Telephone Encounter (Signed)
Monitor reviewed by Dr. Claiborne Billings (DOD) who state monitor reveals PAF with NSVT.  Recommendation are to continue to monitor and have pt  f/u with Dr. Gwenlyn Found. Nurse attempted to contact pt again. Left message to call back.   Strip placed in Dr. Kennon Holter box.

## 2019-07-02 ENCOUNTER — Telehealth: Payer: Self-pay

## 2019-07-02 NOTE — Telephone Encounter (Signed)
Patient returning call.

## 2019-07-02 NOTE — Telephone Encounter (Signed)
Left message to call back  

## 2019-07-02 NOTE — Telephone Encounter (Signed)
Received several critical monitors from [employer] dating 12/25 and 12/28. Strips reviewed by Dr. Marisue Ivan (DOD) who recommend pt schedule an appointment.   Nurse attempted to contact pt. Left message to call back.

## 2019-07-03 ENCOUNTER — Telehealth: Payer: Self-pay

## 2019-07-03 DIAGNOSIS — I471 Supraventricular tachycardia: Secondary | ICD-10-CM

## 2019-07-03 NOTE — Telephone Encounter (Signed)
Left message for pt to CB and schedule appt

## 2019-07-03 NOTE — Telephone Encounter (Signed)
Returned call to pt. Pt states he felt his heart racing during episode but had no other symptoms.   Cardiac report provided to Dr. Gwenlyn Found for review  Recommendation from Dr. Gwenlyn Found is referral to Dr. Allegra Lai in EP. Pt aware of recommendation. Order for referral in [place]

## 2019-07-03 NOTE — Telephone Encounter (Signed)
Albert Perez from med rec provided triage nurse with 12/28 fax from [employer]. Fax is cardiac report for 12/28 at 8:52 am.  Report Analysis: SVT (1 min)  Attempted call to pt at pt main number [phone] (W). 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 Spoke with rep who stated pt on the phone. Provided her with name and call back number. Requested that she ask pt to call back. She verbalized understanding

## 2019-07-03 NOTE — Telephone Encounter (Signed)
Patient is returning phone call.  °

## 2019-07-04 ENCOUNTER — Telehealth: Payer: Self-pay | Admitting: Cardiovascular Disease

## 2019-07-04 NOTE — Telephone Encounter (Signed)
lm2cb  

## 2019-07-04 NOTE — Telephone Encounter (Signed)
Needs return office visit in the next couple weeks to discuss his event monitor findings

## 2019-07-04 NOTE — Telephone Encounter (Signed)
Notified by [employer] that patient had 6s (15 beat) run of VT. This was followed immediately by 18 seconds of what looked like an SVT. Patient then returned to [place]. The [employer] reader was unsure whether the 6 beats of VT were true VT or actual SVT with some aberrant conduction. Will alert Dr. Gwenlyn Found.   Dorcas Melito K. Marletta Lor, MD

## 2019-07-04 NOTE — Telephone Encounter (Signed)
Informed pt that he requested this in a message 06-26-2019 he states that he did not remember doing this. He will come in fasting and have this done.

## 2019-07-04 NOTE — Telephone Encounter (Signed)
This patient has an appointment with Dr. Curt Bears on 07/26/19---does he still need to see Dr. Gwenlyn Found?

## 2019-07-04 NOTE — Telephone Encounter (Signed)
Called to schedule follow up appointment with Dr. Gwenlyn Found to discuss monitor results---patient states he received a letter and paperwork from [place] to have lab work done---he is unsure why he received this.

## 2019-07-06 NOTE — Telephone Encounter (Signed)
If he already has an appointment with [facility] he doesn't need to see me back until sometime in March

## 2019-07-07 ENCOUNTER — Telehealth: Payer: Self-pay | Admitting: Medical

## 2019-07-07 NOTE — Telephone Encounter (Signed)
   Notified by [employer] that the patient had an 11 beat run of VT at 157 bpm around 5:30am, with return to sinus brady at 43 bpm with PACs. It appears this has been a reoccurring issue while on the cardiac monitor. He is scheduled to see Dr. Gwenlyn Found 07/11/2019 and Dr. Curt Bears 07/26/2019 for further evaluation. Will continue to monitor for now.   Abigail Butts, PA-C 07/07/19; 7:36 AM

## 2019-07-09 NOTE — Telephone Encounter (Signed)
Called and let pt know that he does not need appt with Dr Gwenlyn Found on 1/6 if seeing Dr Curt Bears on 1/21. Agreed. Canceled appt.

## 2019-07-11 ENCOUNTER — Ambulatory Visit: Payer: Medicare Other | Admitting: Cardiovascular Disease

## 2019-07-24 ENCOUNTER — Other Ambulatory Visit: Payer: Self-pay | Admitting: Medical Oncology

## 2019-07-24 DIAGNOSIS — C3492 Malignant neoplasm of unspecified part of left bronchus or lung: Secondary | ICD-10-CM

## 2019-07-24 MED ORDER — OSIMERTINIB MESYLATE 80 MG PO TABS: 80.0000 mg | ORAL_TABLET | Freq: Every day | ORAL | 2 refills | Status: DC

## 2019-07-24 NOTE — Telephone Encounter (Signed)
tagrisso refill requested.

## 2019-07-25 ENCOUNTER — Telehealth: Payer: Self-pay | Admitting: Medical Oncology

## 2019-07-25 DIAGNOSIS — C3492 Malignant neoplasm of unspecified part of left bronchus or lung: Secondary | ICD-10-CM

## 2019-07-25 NOTE — Telephone Encounter (Signed)
Pt notified that [facility] has Tagrisso rx and will send out asap.

## 2019-07-26 ENCOUNTER — Inpatient Hospital Stay: Payer: Medicare Other | Attending: Internal Medicine | Admitting: Internal Medicine

## 2019-07-26 ENCOUNTER — Other Ambulatory Visit: Payer: Self-pay

## 2019-07-26 ENCOUNTER — Inpatient Hospital Stay: Payer: Medicare Other

## 2019-07-26 ENCOUNTER — Ambulatory Visit: Payer: Medicare Other | Admitting: Cardiology

## 2019-07-26 ENCOUNTER — Encounter: Payer: Self-pay | Admitting: Internal Medicine

## 2019-07-26 ENCOUNTER — Encounter: Payer: Self-pay | Admitting: Cardiology

## 2019-07-26 VITALS — BP 146/78 | HR 60 | Ht 67.0 in | Wt 179.0 lb

## 2019-07-26 VITALS — BP 141/56 | HR 60 | Temp 97.8°F | Resp 16 | Ht 67.0 in | Wt 177.2 lb

## 2019-07-26 DIAGNOSIS — G4730 Sleep apnea, unspecified: Secondary | ICD-10-CM | POA: Insufficient documentation

## 2019-07-26 DIAGNOSIS — I471 Supraventricular tachycardia, unspecified: Secondary | ICD-10-CM

## 2019-07-26 DIAGNOSIS — I10 Essential (primary) hypertension: Secondary | ICD-10-CM

## 2019-07-26 DIAGNOSIS — C7951 Secondary malignant neoplasm of bone: Secondary | ICD-10-CM | POA: Insufficient documentation

## 2019-07-26 DIAGNOSIS — Z8673 Personal history of transient ischemic attack (TIA), and cerebral infarction without residual deficits: Secondary | ICD-10-CM | POA: Diagnosis not present

## 2019-07-26 DIAGNOSIS — Z9221 Personal history of antineoplastic chemotherapy: Secondary | ICD-10-CM | POA: Diagnosis not present

## 2019-07-26 DIAGNOSIS — C3490 Malignant neoplasm of unspecified part of unspecified bronchus or lung: Secondary | ICD-10-CM | POA: Diagnosis not present

## 2019-07-26 DIAGNOSIS — Z79899 Other long term (current) drug therapy: Secondary | ICD-10-CM | POA: Insufficient documentation

## 2019-07-26 DIAGNOSIS — C3492 Malignant neoplasm of unspecified part of left bronchus or lung: Secondary | ICD-10-CM | POA: Diagnosis not present

## 2019-07-26 DIAGNOSIS — Z923 Personal history of irradiation: Secondary | ICD-10-CM | POA: Insufficient documentation

## 2019-07-26 DIAGNOSIS — Z7982 Long term (current) use of aspirin: Secondary | ICD-10-CM | POA: Diagnosis not present

## 2019-07-26 DIAGNOSIS — Z5111 Encounter for antineoplastic chemotherapy: Secondary | ICD-10-CM

## 2019-07-26 DIAGNOSIS — I4891 Unspecified atrial fibrillation: Secondary | ICD-10-CM | POA: Diagnosis not present

## 2019-07-26 DIAGNOSIS — C3432 Malignant neoplasm of lower lobe, left bronchus or lung: Secondary | ICD-10-CM | POA: Insufficient documentation

## 2019-07-26 DIAGNOSIS — Z85820 Personal history of malignant melanoma of skin: Secondary | ICD-10-CM | POA: Diagnosis not present

## 2019-07-26 DIAGNOSIS — Z86718 Personal history of other venous thrombosis and embolism: Secondary | ICD-10-CM | POA: Insufficient documentation

## 2019-07-26 DIAGNOSIS — Z791 Long term (current) use of non-steroidal anti-inflammatories (NSAID): Secondary | ICD-10-CM | POA: Diagnosis not present

## 2019-07-26 LAB — CBC WITH DIFFERENTIAL (CANCER CENTER ONLY)
Abs Immature Granulocytes: 0.03 10*3/uL (ref 0.00–0.07)
Basophils Absolute: 0 10*3/uL (ref 0.0–0.1)
Basophils Relative: 0 %
Eosinophils Absolute: 0.1 10*3/uL (ref 0.0–0.5)
Eosinophils Relative: 2 %
HCT: 41.6 % (ref 39.0–52.0)
Hemoglobin: 13.7 g/dL (ref 13.0–17.0)
Immature Granulocytes: 1 %
Lymphocytes Relative: 23 %
Lymphs Abs: 1.2 10*3/uL (ref 0.7–4.0)
MCH: 31.6 pg (ref 26.0–34.0)
MCHC: 32.9 g/dL (ref 30.0–36.0)
MCV: 95.9 fL (ref 80.0–100.0)
Monocytes Absolute: 0.8 10*3/uL (ref 0.1–1.0)
Monocytes Relative: 14 %
Neutro Abs: 3.2 10*3/uL (ref 1.7–7.7)
Neutrophils Relative %: 60 %
Platelet Count: 165 10*3/uL (ref 150–400)
RBC: 4.34 MIL/uL (ref 4.22–5.81)
RDW: 14.6 % (ref 11.5–15.5)
WBC Count: 5.3 10*3/uL (ref 4.0–10.5)
nRBC: 0 % (ref 0.0–0.2)

## 2019-07-26 LAB — CMP (CANCER CENTER ONLY)
ALT: 28 U/L (ref 0–44)
AST: 22 U/L (ref 15–41)
Albumin: 3.8 g/dL (ref 3.5–5.0)
Alkaline Phosphatase: 90 U/L (ref 38–126)
Anion gap: 6 (ref 5–15)
BUN: 23 mg/dL (ref 8–23)
CO2: 27 mmol/L (ref 22–32)
Calcium: 8.9 mg/dL (ref 8.9–10.3)
Chloride: 109 mmol/L (ref 98–111)
Creatinine: 1.28 mg/dL — ABNORMAL HIGH (ref 0.61–1.24)
GFR, Est AFR Am: >60 mL/min (ref >60)
GFR, Estimated: 57 mL/min — ABNORMAL LOW (ref >60)
Glucose, Bld: 80 mg/dL (ref 70–99)
Potassium: 4.9 mmol/L (ref 3.5–5.1)
Sodium: 142 mmol/L (ref 135–145)
Total Bilirubin: 0.6 mg/dL (ref 0.3–1.2)
Total Protein: 6.7 g/dL (ref 6.5–8.1)

## 2019-07-26 MED ORDER — CARVEDILOL 6.25 MG PO TABS: 6.2500 mg | ORAL_TABLET | Freq: Two times a day (BID) | ORAL | 3 refills | Status: DC

## 2019-07-26 NOTE — Progress Notes (Signed)
Electrophysiology Office Note   Date:  07/26/2019   ID:  Albert Perez, DOB 1951-06-17, MRN [id number]  PCP:  Albert Dials, MD  Cardiologist:  Albert Perez Primary Electrophysiologist:  Albert Nardo [name], MD    Chief Complaint: SVT   History of Present Illness: Albert Perez is a 69 y.o. male who is being seen today for the evaluation of SVT at the request of Albert Harp, MD. Presenting today for electrophysiology evaluation.  He has a history of atrial fibrillation, DVT, alcohol abuse, OSA not on CPAP, CVA.  He has a diagnosis of lung cancer and is down to 3 cigarettes a day.  He had a brief episode of atrial fibrillation in the year 2000 related to increased stress, alcohol, and caffeine.  He changed his lifestyle and thus this was stopped.  He is not anticoagulated.  He has had fairly frequent tachypalpitations that are self-limited.  He was seen in the emergency room with palpitations that converted with Valsalva.  He has palpitations once or twice a week.  They last for a few minutes at a time.  They generally convert with Valsalva.  They also converted with taking deep breaths.  Today, he denies symptoms of palpitations, chest pain, shortness of breath, orthopnea, PND, lower extremity edema, claudication, dizziness, presyncope, syncope, bleeding, or neurologic sequela. The patient is tolerating medications without difficulties.    Past Medical History:  Diagnosis Date  . A-fib [facility])    PMH: 2000-2001  . Arthritis   . Cancer ([place])    melanoma 2018  . DVT (deep venous thrombosis) (HCC)    Left brachial vein DVT (01/30/19, following fracture)  . ETOH abuse   . GERD (gastroesophageal reflux disease)   . Sleep apnea    do not wear CPAP  . Stroke [facility])    " mini"  . Vertigo   . Wears glasses    Past Surgical History:  Procedure Laterality Date  . ANKLE FRACTURE SURGERY Right 2003  . COLONOSCOPY  2018  . ELBOW SURGERY Left 1964  . melanoma removal  04/2017   Lt shoulder   . RETINAL DETACHMENT SURGERY Right 1971  . TONSILLECTOMY    . VIDEO BRONCHOSCOPY WITH ENDOBRONCHIAL NAVIGATION N/A 03/28/2019   Procedure: VIDEO BRONCHOSCOPY WITH ENDOBRONCHIAL NAVIGATION and Biopsies;  Surgeon: Albert Gobble, MD;  Location: [facility] OR;  Service: Thoracic;  Laterality: N/A;     Current Outpatient Medications  Medication Sig Dispense Refill  . acetaminophen (TYLENOL) 650 MG CR tablet Take 1,300 mg by mouth every 8 (eight) hours as needed for pain.    Marland Kitchen aspirin 81 MG tablet Take 1 tablet (81 mg total) by mouth Perez.    . Cyanocobalamin (B-12 PO) Take 1,000 mg by mouth Perez.     . meloxicam (MOBIC) 15 MG tablet Take 15 mg by mouth Perez.    . Multiple Vitamin (MULTIVITAMIN PO) Take by mouth Perez. Centrum    . Omega-3 Fatty Acids (OMEGA-3 PO) Take 1,200 mg by mouth Perez.     Marland Kitchen osimertinib mesylate (TAGRISSO) 80 MG tablet Take 1 tablet (80 mg total) by mouth Perez. 30 tablet 2  . rosuvastatin (CRESTOR) 5 MG tablet Take 5 mg by mouth Perez.  2   No current facility-administered medications for this visit.    Allergies:   Patient has no known allergies.   Social History:  The patient  reports that he quit smoking about 4 Perez ago. His smoking use included cigarettes. He has a  10.00 pack-year smoking history. He has never used smokeless tobacco. He reports current alcohol use of about 24.0 standard drinks of alcohol per week. He reports current drug use. Drug: Marijuana.   Family History:  The patient's family history includes Aneurysm in his mother; Colon cancer in his brother; Prostate cancer in his brother.    ROS:  Please see the history of present illness.   Otherwise, review of systems is positive for none.   All other systems are reviewed and negative.    PHYSICAL EXAM: VS:  BP (!) 146/78   Pulse 60   Ht 5\' 7"  (1.702 m)   Wt 179 lb (81.2 kg)   SpO2 97%   BMI 28.04 kg/m  , BMI Body mass index is 28.04 kg/m. GEN: Well nourished, well developed, in no acute  distress  HEENT: normal  Neck: no JVD, carotid bruits, or masses Cardiac: RRR; no murmurs, rubs, or gallops,no edema  Respiratory:  clear to auscultation bilaterally, normal work of breathing GI: soft, nontender, nondistended, + BS MS: no deformity or atrophy  Skin: warm and dry Neuro:  Strength and sensation are intact Psych: euthymic mood, full affect  EKG:  EKG is ordered today. Personal review of the ekg ordered shows sinus rhythm, rate 60  Recent Labs: 06/04/2019: Magnesium 2.0 06/25/2019: ALT 16; BUN 15; Creatinine 1.21; Hemoglobin 13.8; Platelet Count 153; Potassium 4.9; Sodium 140    Lipid Panel  No results Perez for: CHOL, TRIG, HDL, CHOLHDL, VLDL, LDLCALC, LDLDIRECT   Wt Readings from Last 3 Encounters:  07/26/19 179 lb (81.2 kg)  06/25/19 177 lb 9.6 oz (80.6 kg)  06/14/19 180 lb 12.8 oz (82 kg)      Other studies Reviewed: Additional studies/ records that were reviewed today include: TTE 11/16/17  Review of the above records today demonstrates:  - Left ventricle: The cavity size was normal. Wall thickness was   normal. Systolic function was normal. The estimated ejection   fraction was in the range of 55% to 60%. Wall motion was normal;   there were no regional wall motion abnormalities. Doppler   parameters are consistent with abnormal left ventricular   relaxation (grade 1 diastolic dysfunction). - Right atrium: The atrium was mildly dilated.   ASSESSMENT AND PLAN:  1.  SVT: Perez in the emergency room 06/04/2019.  I talked about the possibility of ablation versus medical management.  Due to the coronavirus pandemic and decreased procedures, Albert Albert Perez plan for medical management at this time.  Blood pressure is mildly elevated today.  Albert Albert Perez.  Albert Perez.  2.  Hypertension: Mildly elevated today.  Planning to start carvedilol.  This can be adjusted further by his primary physician.  3.   Obstructive sleep apnea: Currently does not wear CPAP.  Albert Perez refer to sleep clinic.  4.  Tobacco abuse: Cessation encouraged  5.  Atrial fibrillation: 1 episode in the year 2000.  Was thought due to stress, caffeine, and alcohol.  No recurrence.  Is currently not anticoagulated.  Case discussed with referring cardiologist  Current medicines are reviewed at length with the patient today.   The patient does not have concerns regarding his medicines.  The following changes were made today:  none  Labs/ tests ordered today include:  Orders Placed This Encounter  Procedures  . EKG 12-Lead     Disposition:   FU with Rachael Ferrie 3 Perez  Signed,  Blayke Cordrey [name], MD  07/26/2019 9:30 AM     [facility] [place] [address] [phone] (office) [phone] (fax)

## 2019-07-26 NOTE — Progress Notes (Signed)
[place] [phone]   Fax:([phone]  OFFICE PROGRESS NOTE  Aura Dials, MD [place] [address]  DIAGNOSIS: Stage IV (T3, N0, M1c) non-small cell lung cancer, adenocarcinoma presented with cavitary left lower lobe lung mass in addition to other pulmonary nodules and scattered hypermetabolic osseous metastatic disease involving the spine, ribs and bony pelvis as well as left proximal humeral pathologic fracture.   Molecular Biomarkers performed by [facility]TER[employer](S) / [name]      [id number]  Afatinib, Dacomitinib, Erlotinib, Gefitinib, Osimertinib, Ramucirumab Neratinib  FGFR1Amplification  Erdafitinib, Lenvatinib, Nintedanib, Pazopanib, Pemigatinib, Ponatinib  [id number] None  PRIOR THERAPY: None  CURRENT THERAPY:  1) Palliative radiotherapy to the the left humerus under the care of Dr. Sondra Come. Last treatment expected on 04/24/2019 2) Targeted therapy with Tagrisso 80 p.o. daily.  He started the first dose on April 21, 2019.  Status post 3 months of treatment.  INTERVAL HISTORY: Albert Perez 69 y.o. male returns to the clinic today for follow-up visit.  The patient is feeling fine today with no concerning complaints.  He is tolerating his treatment with Tagrisso fairly well.  He has 1 episode of diarrhea every week.  He denied having any current chest pain, shortness of breath, cough or hemoptysis.  He denied having any nausea, vomiting, diarrhea or constipation.  He has no headache or visual changes.  The patient has no recent weight loss or night sweats.  Is here today for evaluation and repeat blood work.  MEDICAL HISTORY: Past Medical History:  Diagnosis Date  . A-fib [facility])    PMH: 2000-2001  . Arthritis   . Cancer ([place])    melanoma 2018  . DVT (deep venous thrombosis) (HCC)    Left brachial vein DVT (01/30/19, following fracture)  . ETOH abuse   . GERD  (gastroesophageal reflux disease)   . Sleep apnea    do not wear CPAP  . Stroke [facility])    " mini"  . Vertigo   . Wears glasses     ALLERGIES:  has No Known Allergies.  MEDICATIONS:  Current Outpatient Medications  Medication Sig Dispense Refill  . acetaminophen (TYLENOL) 650 MG CR tablet Take 1,300 mg by mouth every 8 (eight) hours as needed for pain.    Marland Kitchen aspirin 81 MG tablet Take 1 tablet (81 mg total) by mouth daily.    . carvedilol (COREG) 6.25 MG tablet Take 1 tablet (6.25 mg total) by mouth 2 (two) times daily. 60 tablet 3  . Cyanocobalamin (B-12 PO) Take 1,000 mg by mouth daily.     . meloxicam (MOBIC) 15 MG tablet Take 15 mg by mouth daily.    . Multiple Vitamin (MULTIVITAMIN PO) Take by mouth daily. Centrum    . Omega-3 Fatty Acids (OMEGA-3 PO) Take 1,200 mg by mouth daily.     Marland Kitchen osimertinib mesylate (TAGRISSO) 80 MG tablet Take 1 tablet (80 mg total) by mouth daily. 30 tablet 2  . rosuvastatin (CRESTOR) 5 MG tablet Take 5 mg by mouth daily.  2   No current facility-administered medications for this visit.    SURGICAL HISTORY:  Past Surgical History:  Procedure Laterality Date  . ANKLE FRACTURE SURGERY Right 2003  . COLONOSCOPY  2018  . ELBOW SURGERY Left 1964  . melanoma removal  04/2017   Lt shoulder  . RETINAL DETACHMENT SURGERY Right 1971  . TONSILLECTOMY    . VIDEO BRONCHOSCOPY WITH  ENDOBRONCHIAL NAVIGATION N/A 03/28/2019   Procedure: VIDEO BRONCHOSCOPY WITH ENDOBRONCHIAL NAVIGATION and Biopsies;  Surgeon: Collene Gobble, MD;  Location: [facility] OR;  Service: Thoracic;  Laterality: N/A;    REVIEW OF SYSTEMS:  A comprehensive review of systems was negative.   PHYSICAL EXAMINATION: General appearance: alert, cooperative and no distress Head: Normocephalic, without obvious abnormality, atraumatic Neck: no adenopathy, no JVD, supple, symmetrical, trachea midline and thyroid not enlarged, symmetric, no tenderness/mass/nodules Lymph nodes: Cervical, supraclavicular, and  axillary nodes normal. Resp: clear to auscultation bilaterally Back: symmetric, no curvature. ROM normal. No CVA tenderness. Cardio: regular rate and rhythm, S1, S2 normal, no murmur, click, rub or gallop GI: soft, non-tender; bowel sounds normal; no masses,  no organomegaly Extremities: extremities normal, atraumatic, no cyanosis or edema   ECOG PERFORMANCE STATUS: 1 - Symptomatic but completely ambulatory  Blood pressure (!) 141/56, pulse 60, temperature 97.8 F (36.6 C), temperature source Temporal, resp. rate 16, height '5\' 7"'  (1.702 m), weight 177 lb 3.2 oz (80.4 kg), SpO2 100 %.  LABORATORY DATA: Lab Results  Component Value Date   WBC 5.3 07/26/2019   HGB 13.7 07/26/2019   HCT 41.6 07/26/2019   MCV 95.9 07/26/2019   PLT 165 07/26/2019      Chemistry      Component Value Date/Time   NA 140 06/25/2019 1148   K 4.9 06/25/2019 1148   CL 106 06/25/2019 1148   CO2 26 06/25/2019 1148   BUN 15 06/25/2019 1148   CREATININE 1.21 06/25/2019 1148      Component Value Date/Time   CALCIUM 9.0 06/25/2019 1148   ALKPHOS 93 06/25/2019 1148   AST 18 06/25/2019 1148   ALT 16 06/25/2019 1148   BILITOT 0.7 06/25/2019 1148       RADIOGRAPHIC STUDIES: No results found.  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  He is status post palliative radiotherapy to the metastatic disease in the left arm. The patient is currently undergoing treatment with target therapy with Tagrisso 80 mg p.o. daily started 3 months ago. He has been tolerating this treatment well with no concerning adverse  effects. I recommended for him to continue his current treatment with Tagrisso with the same dose. I will see the patient back for follow-up visit in 6 weeks for evaluation after repeating CT scan of the chest, abdomen pelvis for restaging of his disease. The patient was advised to call immediately if he has any concerning symptoms in the interval. The patient voices understanding of current disease status and treatment options and is in agreement with the current care plan. All questions were answered. The patient knows to call the clinic with any problems, questions or concerns. We can certainly see the patient much sooner if necessary.   Disclaimer: This note was dictated with voice recognition software. Similar sounding words can inadvertently be transcribed and may not be corrected upon review.

## 2019-07-26 NOTE — Patient Instructions (Addendum)
Medication Instructions:  Your physician has recommended you make the following change in your medication:  1. START Carvedilol 6.25 mg twice a day  * If you need a refill on your cardiac medications before your next appointment, please call your pharmacy.   Labwork: None ordered If you have labs (blood work) drawn today and your tests are completely normal, you will receive your results only by:  [place] (if you have MyChart) OR  A paper copy in the mail If you have any lab test that is abnormal or we need to change your treatment, we will call you to review the results.  Testing/Procedures: None ordered  Follow-Up: At [facility], you and your health needs are our priority.  As part of our continuing mission to provide you with exceptional heart care, we have created designated Provider Care Teams.  These Care Teams include your primary Cardiologist (physician) and Advanced Practice Providers (APPs -  Physician Assistants and Nurse Practitioners) who all work together to provide you with the care you need, when you need it.  You will need a follow up appointment in 3 months with Dr. Curt Bears.  Please call our office 2 months in advance to schedule this appointment.  You may see Dr Curt Bears or one of the following Advanced Practice Providers on your designated Care Team:    Chanetta Marshall, NP  Tommye Standard, PA-C  Oda Kilts, [place]  Thank you for choosing [facility]!!    Trinidad Curet, RN [phone]  Any Other Special Instructions Will Be Listed Below (If Applicable).  Carvedilol Tablets What is this medicine? CARVEDILOL (KAR ve dil ol) is a beta blocker. It decreases the amount of work your heart has to do and helps your heart beat regularly. It treats high blood pressure. This medicine may be used for other purposes; ask your health care provider or pharmacist if you have questions. COMMON BRAND NAME(S): Coreg What should I tell my health care provider before  I take this medicine? They need to know if you have any of these conditions:  circulation problems  diabetes  history of heart attack or heart disease  liver disease  lung or breathing disease, like asthma or emphysema  pheochromocytoma  slow or irregular heartbeat  thyroid disease  an unusual or allergic reaction to carvedilol, other beta-blockers, medicines, foods, dyes, or preservatives  pregnant or trying to get pregnant  breast-feeding How should I use this medicine? Take this drug by mouth. Take it as directed on the prescription label at the same time every day. Take it with food. Keep taking it unless your health care provider tells you to stop. Talk to your health care provider about the use of this drug in children. Special care may be needed. Overdosage: If you think you have taken too much of this medicine contact a poison control center or emergency room at once. NOTE: This medicine is only for you. Do not share this medicine with others. What if I miss a dose? If you miss a dose, take it as soon as you can. If it is almost time for your next dose, take only that dose. Do not take double or extra doses. What may interact with this medicine? This medicine may interact with the following medications:  certain medicines for blood pressure, heart disease, irregular heart beat  certain medicines for depression, like fluoxetine or paroxetine  certain medicines for diabetes, like glipizide or glyburide  cimetidine  clonidine  cyclosporine  digoxin  MAOIs like Carbex, Eldepryl, Marplan, Nardil, and Parnate  reserpine  rifampin This list may not describe all possible interactions. Give your health care provider a list of all the medicines, herbs, non-prescription drugs, or dietary supplements you use. Also tell them if you smoke, drink alcohol, or use illegal drugs. Some items may interact with your medicine. What should I watch for while using this  medicine? Check your heart rate and blood pressure regularly while you are taking this medicine. Ask your doctor or health care professional what your heart rate and blood pressure should be, and when you should contact him or her. Do not stop taking this medicine suddenly. This could lead to serious heart-related effects. Contact your doctor or health care professional if you have difficulty breathing while taking this drug. Check your weight daily. Ask your doctor or health care professional when you should notify him/her of any weight gain. You may get drowsy or dizzy. Do not drive, use machinery, or do anything that requires mental alertness until you know how this medicine affects you. To reduce the risk of dizzy or fainting spells, do not sit or stand up quickly. Alcohol can make you more drowsy, and increase flushing and rapid heartbeats. Avoid alcoholic drinks. This medicine may increase blood sugar. Ask your healthcare provider if changes in diet or medicines are needed if you have diabetes. If you are going to have surgery, tell your doctor or health care professional that you are taking this medicine. What side effects may I notice from receiving this medicine? Side effects that you should report to your doctor or health care professional as soon as possible:  allergic reactions like skin rash, itching or hives, swelling of the face, lips, or tongue  breathing problems  dark urine  irregular heartbeat   signs and symptoms of high blood sugar such as being more thirsty or hungry or having to urinate more than normal. You may also feel very tired or have blurry vision.  swollen legs or ankles  vomiting  yellowing of the eyes or skin Side effects that usually do not require medical attention (report to your doctor or health care professional if they continue or are bothersome):  change in sex drive or performance  diarrhea  dry eyes (especially if wearing contact  lenses)  dry, itching skin  headache  nausea  unusually tired This list may not describe all possible side effects. Call your doctor for medical advice about side effects. You may report side effects to FDA at 1-800-FDA-1088. Where should I keep my medicine? Keep out of the reach of children and pets. Store at room temperature between 20 and 25 degrees C (68 and 77 degrees F). Protect from moisture. Keep the container tightly closed. Throw away any unused drug after the expiration date. NOTE: This sheet is a summary. It may not cover all possible information. If you have questions about this medicine, talk to your doctor, pharmacist, or health care provider.  2020 [employer] (2019-01-26 17:42:09)

## 2019-07-27 ENCOUNTER — Telehealth: Payer: Self-pay | Admitting: Internal Medicine

## 2019-07-27 NOTE — Telephone Encounter (Signed)
Scheduled per los. Called and left msg. Mailed printout  °

## 2019-08-15 ENCOUNTER — Other Ambulatory Visit: Payer: Self-pay | Admitting: Cardiovascular Disease

## 2019-08-15 DIAGNOSIS — R002 Palpitations: Secondary | ICD-10-CM

## 2019-08-15 DIAGNOSIS — Z8679 Personal history of other diseases of the circulatory system: Secondary | ICD-10-CM

## 2019-09-01 ENCOUNTER — Encounter: Payer: Self-pay | Admitting: Internal Medicine

## 2019-09-03 ENCOUNTER — Ambulatory Visit (HOSPITAL_COMMUNITY)
Admission: RE | Admit: 2019-09-03 | Discharge: 2019-09-03 | Disposition: A | Discharge status: Home / Self Care | Payer: Medicare Other | Source: Ambulatory Visit | Attending: Internal Medicine | Admitting: Internal Medicine

## 2019-09-03 ENCOUNTER — Other Ambulatory Visit: Payer: Self-pay

## 2019-09-03 ENCOUNTER — Encounter (HOSPITAL_COMMUNITY): Payer: Self-pay

## 2019-09-03 ENCOUNTER — Inpatient Hospital Stay: Payer: Medicare Other | Attending: Internal Medicine

## 2019-09-03 DIAGNOSIS — Z85820 Personal history of malignant melanoma of skin: Secondary | ICD-10-CM | POA: Insufficient documentation

## 2019-09-03 DIAGNOSIS — C3432 Malignant neoplasm of lower lobe, left bronchus or lung: Secondary | ICD-10-CM | POA: Insufficient documentation

## 2019-09-03 DIAGNOSIS — C7951 Secondary malignant neoplasm of bone: Secondary | ICD-10-CM | POA: Insufficient documentation

## 2019-09-03 DIAGNOSIS — C3490 Malignant neoplasm of unspecified part of unspecified bronchus or lung: Secondary | ICD-10-CM | POA: Diagnosis not present

## 2019-09-03 DIAGNOSIS — Z86718 Personal history of other venous thrombosis and embolism: Secondary | ICD-10-CM | POA: Insufficient documentation

## 2019-09-03 DIAGNOSIS — Z8673 Personal history of transient ischemic attack (TIA), and cerebral infarction without residual deficits: Secondary | ICD-10-CM | POA: Insufficient documentation

## 2019-09-03 DIAGNOSIS — G4730 Sleep apnea, unspecified: Secondary | ICD-10-CM | POA: Diagnosis not present

## 2019-09-03 DIAGNOSIS — Z791 Long term (current) use of non-steroidal anti-inflammatories (NSAID): Secondary | ICD-10-CM | POA: Insufficient documentation

## 2019-09-03 DIAGNOSIS — Z79899 Other long term (current) drug therapy: Secondary | ICD-10-CM | POA: Diagnosis not present

## 2019-09-03 DIAGNOSIS — I10 Essential (primary) hypertension: Secondary | ICD-10-CM | POA: Insufficient documentation

## 2019-09-03 DIAGNOSIS — Z7982 Long term (current) use of aspirin: Secondary | ICD-10-CM | POA: Diagnosis not present

## 2019-09-03 HISTORY — DX: Secondary malignant neoplasm of bone: C79.51

## 2019-09-03 HISTORY — DX: Malignant neoplasm of unspecified part of unspecified bronchus or lung: C34.90

## 2019-09-03 LAB — CBC WITH DIFFERENTIAL (CANCER CENTER ONLY)
Abs Immature Granulocytes: 0.02 10*3/uL (ref 0.00–0.07)
Basophils Absolute: 0 10*3/uL (ref 0.0–0.1)
Basophils Relative: 1 %
Eosinophils Absolute: 0.1 10*3/uL (ref 0.0–0.5)
Eosinophils Relative: 2 %
HCT: 41.8 % (ref 39.0–52.0)
Hemoglobin: 13.8 g/dL (ref 13.0–17.0)
Immature Granulocytes: 0 %
Lymphocytes Relative: 23 %
Lymphs Abs: 1.3 10*3/uL (ref 0.7–4.0)
MCH: 31.9 pg (ref 26.0–34.0)
MCHC: 33 g/dL (ref 30.0–36.0)
MCV: 96.8 fL (ref 80.0–100.0)
Monocytes Absolute: 0.7 10*3/uL (ref 0.1–1.0)
Monocytes Relative: 12 %
Neutro Abs: 3.6 10*3/uL (ref 1.7–7.7)
Neutrophils Relative %: 62 %
Platelet Count: 149 10*3/uL — ABNORMAL LOW (ref 150–400)
RBC: 4.32 MIL/uL (ref 4.22–5.81)
RDW: 13.9 % (ref 11.5–15.5)
WBC Count: 5.8 10*3/uL (ref 4.0–10.5)
nRBC: 0 % (ref 0.0–0.2)

## 2019-09-03 LAB — CMP (CANCER CENTER ONLY)
ALT: 14 U/L (ref 0–44)
AST: 15 U/L (ref 15–41)
Albumin: 3.5 g/dL (ref 3.5–5.0)
Alkaline Phosphatase: 82 U/L (ref 38–126)
Anion gap: 6 (ref 5–15)
BUN: 15 mg/dL (ref 8–23)
CO2: 27 mmol/L (ref 22–32)
Calcium: 8.8 mg/dL — ABNORMAL LOW (ref 8.9–10.3)
Chloride: 107 mmol/L (ref 98–111)
Creatinine: 1.15 mg/dL (ref 0.61–1.24)
GFR, Est AFR Am: >60 mL/min (ref >60)
GFR, Estimated: >60 mL/min (ref >60)
Glucose, Bld: 105 mg/dL — ABNORMAL HIGH (ref 70–99)
Potassium: 4.7 mmol/L (ref 3.5–5.1)
Sodium: 140 mmol/L (ref 135–145)
Total Bilirubin: 0.4 mg/dL (ref 0.3–1.2)
Total Protein: 6.2 g/dL — ABNORMAL LOW (ref 6.5–8.1)

## 2019-09-03 MED ORDER — SODIUM CHLORIDE (PF) 0.9 % IJ SOLN
INTRAMUSCULAR | Status: AC
  Filled 2019-09-03: qty 50

## 2019-09-03 MED ORDER — IOHEXOL 300 MG/ML  SOLN
100.0000 mL | Freq: Once | INTRAMUSCULAR | Status: AC | PRN
  Administered 2019-09-03: 100 mL via INTRAVENOUS

## 2019-09-05 ENCOUNTER — Encounter: Payer: Self-pay | Admitting: Internal Medicine

## 2019-09-05 ENCOUNTER — Other Ambulatory Visit: Payer: Self-pay

## 2019-09-05 ENCOUNTER — Telehealth: Payer: Self-pay | Admitting: Internal Medicine

## 2019-09-05 ENCOUNTER — Inpatient Hospital Stay (HOSPITAL_BASED_OUTPATIENT_CLINIC_OR_DEPARTMENT_OTHER): Payer: Medicare Other | Admitting: Internal Medicine

## 2019-09-05 VITALS — BP 141/63 | HR 53 | Temp 98.0°F | Resp 17 | Ht 67.0 in | Wt 181.7 lb

## 2019-09-05 DIAGNOSIS — C3492 Malignant neoplasm of unspecified part of left bronchus or lung: Secondary | ICD-10-CM

## 2019-09-05 DIAGNOSIS — I10 Essential (primary) hypertension: Secondary | ICD-10-CM

## 2019-09-05 DIAGNOSIS — Z720 Tobacco use: Secondary | ICD-10-CM

## 2019-09-05 DIAGNOSIS — Z5111 Encounter for antineoplastic chemotherapy: Secondary | ICD-10-CM | POA: Diagnosis not present

## 2019-09-05 DIAGNOSIS — C3432 Malignant neoplasm of lower lobe, left bronchus or lung: Secondary | ICD-10-CM | POA: Diagnosis not present

## 2019-09-05 NOTE — Telephone Encounter (Signed)
Scheduled per los. Patient declined printout  

## 2019-09-05 NOTE — Progress Notes (Signed)
[place] [phone]   Fax:([phone]) [phone]  OFFICE PROGRESS NOTE  Aura Dials, MD [place] [address]  DIAGNOSIS: Stage IV (T3, N0, M1c) non-small cell lung cancer, adenocarcinoma presented with cavitary left lower lobe lung mass in addition to other pulmonary nodules and scattered hypermetabolic osseous metastatic disease involving the spine, ribs and bony pelvis as well as left proximal humeral pathologic fracture.   Molecular Biomarkers performed by [employer](S) / [name]      [id number]  Afatinib, Dacomitinib, Erlotinib, Gefitinib, Osimertinib, Ramucirumab Neratinib  FGFR1Amplification  Erdafitinib, Lenvatinib, Nintedanib, Pazopanib, Pemigatinib, Ponatinib  [id number] None  PRIOR THERAPY: None  CURRENT THERAPY:  1) Palliative radiotherapy to the the left humerus under the care of Dr. Sondra Come. Last treatment expected on 04/24/2019 2) Targeted therapy with Tagrisso 80 p.o. daily.  He started the first dose on April 21, 2019.  Status post 5 months of treatment.  INTERVAL HISTORY: Albert Perez 69 y.o. male returns to the clinic today for follow-up visit.  The patient is feeling fine today with no concerning complaints except for mild fatigue.  He denied having any current chest pain, shortness of breath, cough or hemoptysis.  He denied having any fever or chills.  He has no nausea, vomiting, diarrhea or constipation.  He denied having any headache or visual changes.  He has no recent weight loss or night sweats.  He has been tolerating his treatment with Tagrisso fairly well.  The patient had repeat CT scan of the chest, abdomen pelvis performed recently and he is here for evaluation and discussion of his scan results.   MEDICAL HISTORY: Past Medical History:  Diagnosis Date  . A-fib [facility])    PMH: 2000-2001  . Arthritis   . Cancer ([place])    melanoma 2018  . DVT (deep venous  thrombosis) (HCC)    Left brachial vein DVT (01/30/19, following fracture)  . ETOH abuse   . GERD (gastroesophageal reflux disease)   . Lung cancer ([place]) dx'd 02/26/2019  . Metastatic cancer to bone ([place]) dx'd 02/26/2019  . Sleep apnea    do not wear CPAP  . Stroke [facility])    " mini"  . Vertigo   . Wears glasses     ALLERGIES:  has No Known Allergies.  MEDICATIONS:  Current Outpatient Medications  Medication Sig Dispense Refill  . acetaminophen (TYLENOL) 650 MG CR tablet Take 1,300 mg by mouth every 8 (eight) hours as needed for pain.    Marland Kitchen aspirin 81 MG tablet Take 1 tablet (81 mg total) by mouth daily.    . carvedilol (COREG) 6.25 MG tablet Take 1 tablet (6.25 mg total) by mouth 2 (two) times daily. 60 tablet 3  . Cyanocobalamin (B-12 PO) Take 1,000 mg by mouth daily.     . meloxicam (MOBIC) 15 MG tablet Take 15 mg by mouth daily.    . Multiple Vitamin (MULTIVITAMIN PO) Take by mouth daily. Centrum    . Omega-3 Fatty Acids (OMEGA-3 PO) Take 1,200 mg by mouth daily.     Marland Kitchen osimertinib mesylate (TAGRISSO) 80 MG tablet Take 1 tablet (80 mg total) by mouth daily. 30 tablet 2  . rosuvastatin (CRESTOR) 5 MG tablet Take 5 mg by mouth daily.  2   No current facility-administered medications for this visit.    SURGICAL HISTORY:  Past Surgical History:  Procedure Laterality Date  . ANKLE FRACTURE SURGERY Right 2003  .  COLONOSCOPY  2018  . ELBOW SURGERY Left 1964  . melanoma removal  04/2017   Lt shoulder  . RETINAL DETACHMENT SURGERY Right 1971  . TONSILLECTOMY    . VIDEO BRONCHOSCOPY WITH ENDOBRONCHIAL NAVIGATION N/A 03/28/2019   Procedure: VIDEO BRONCHOSCOPY WITH ENDOBRONCHIAL NAVIGATION and Biopsies;  Surgeon: Collene Gobble, MD;  Location: [facility] OR;  Service: Thoracic;  Laterality: N/A;    REVIEW OF SYSTEMS:  Constitutional: positive for fatigue Eyes: negative Ears, nose, mouth, throat, and face: negative Respiratory: negative Cardiovascular: negative Gastrointestinal: negative  Genitourinary:negative Integument/breast: negative Hematologic/lymphatic: negative Musculoskeletal:negative Neurological: negative Behavioral/Psych: negative Endocrine: negative Allergic/Immunologic: negative   PHYSICAL EXAMINATION: General appearance: alert, cooperative and no distress Head: Normocephalic, without obvious abnormality, atraumatic Neck: no adenopathy, no JVD, supple, symmetrical, trachea midline and thyroid not enlarged, symmetric, no tenderness/mass/nodules Lymph nodes: Cervical, supraclavicular, and axillary nodes normal. Resp: clear to auscultation bilaterally Back: symmetric, no curvature. ROM normal. No CVA tenderness. Cardio: regular rate and rhythm, S1, S2 normal, no murmur, click, rub or gallop GI: soft, non-tender; bowel sounds normal; no masses,  no organomegaly Extremities: extremities normal, atraumatic, no cyanosis or edema Neurologic: Alert and oriented X 3, normal strength and tone. Normal symmetric reflexes. Normal coordination and gait   ECOG PERFORMANCE STATUS: 1 - Symptomatic but completely ambulatory  Blood pressure (!) 141/63, pulse (!) 53, temperature 98 F (36.7 C), temperature source Oral, resp. rate 17, height '5\' 7"'  (1.702 m), weight 181 lb 11.2 oz (82.4 kg), SpO2 100 %.  LABORATORY DATA: Lab Results  Component Value Date   WBC 5.8 09/03/2019   HGB 13.8 09/03/2019   HCT 41.8 09/03/2019   MCV 96.8 09/03/2019   PLT 149 (L) 09/03/2019      Chemistry      Component Value Date/Time   NA 140 09/03/2019 0950   K 4.7 09/03/2019 0950   CL 107 09/03/2019 0950   CO2 27 09/03/2019 0950   BUN 15 09/03/2019 0950   CREATININE 1.15 09/03/2019 0950      Component Value Date/Time   CALCIUM 8.8 (L) 09/03/2019 0950   ALKPHOS 82 09/03/2019 0950   AST 15 09/03/2019 0950   ALT 14 09/03/2019 0950   BILITOT 0.4 09/03/2019 0950       RADIOGRAPHIC STUDIES: CT Chest W Contrast  Result Date: 09/03/2019 CLINICAL DATA:  Non-small cell lung cancer  staging. EXAM: CT CHEST, ABDOMEN, AND PELVIS WITH CONTRAST TECHNIQUE: Multidetector CT imaging of the chest, abdomen and pelvis was performed following the standard protocol during bolus administration of intravenous contrast. CONTRAST:  170m OMNIPAQUE IOHEXOL 300 MG/ML  SOLN COMPARISON:  06/07/2019 FINDINGS: CT CHEST FINDINGS Cardiovascular: The heart is normal in size. No pericardial effusion. Stable aortic and coronary artery calcifications. No aortic aneurysm or dissection. Mediastinum/Nodes: Prevascular lymph node on image 24 measures 6 mm and previously measured 7 mm. Stable 11 mm subcarinal lymph node. The esophagus is unremarkable. Lungs/Pleura: Stable changes of radiation fibrosis surrounding the left lower lobe lesion. This is unchanged measuring approximately 3.5 x 2.2 cm. 5 mm lesion in the left upper lobe on image 81/6 previously measured 8 mm. 16 x 12 mm right middle lobe nodule previously measured 20 x 15 mm. No new pulmonary nodules. Stable underlying emphysematous changes and pulmonary scarring. Stable right middle lobe calcified granuloma. No pleural effusions or pleural nodules. Musculoskeletal: Stable appearing mixed lytic and sclerotic osseous metastatic disease mainly involving the spine. No new or progressive findings. CT ABDOMEN PELVIS FINDINGS Hepatobiliary: No focal hepatic lesions to  suggest metastatic disease. Small calcified granuloma noted in the right hepatic lobe. The gallbladder is mildly contracted. No common bile duct dilatation. Pancreas: No mass, inflammation or ductal dilatation. Spleen: Normal size. No focal lesions. Adrenals/Urinary Tract: The adrenal glands and kidneys are unremarkable and stable. Stable simple right renal cyst. The bladder is unremarkable. Stomach/Bowel: Stomach, duodenum, small bowel and colon are unremarkable. No acute inflammatory changes, mass lesions or obstructive findings. Vascular/Lymphatic: Stable age advanced atherosclerotic calcifications  involving the aorta and iliac arteries. No aneurysm. The major venous structures are patent. Several small scattered retroperitoneal lymph nodes are unchanged. 8 mm right external iliac lymph node on image 97/2 previously measured 9 mm. Reproductive: The prostate gland and seminal vesicles are unremarkable. Other: Small bilateral inguinal hernias containing fat. No inguinal adenopathy. Small scattered inguinal lymph nodes are stable. Musculoskeletal: Stable mixed lytic and sclerotic metastatic bone disease involving the lumbar spine and pelvis. No new or progressive findings. IMPRESSION: 1. Stable changes of radiation fibrosis involving the left lower lobe. 2. Slight interval decrease in size of the right middle lobe pulmonary nodule and left upper lobe pulmonary nodule. No new or progressive findings. 3. Stable mediastinal, retroperitoneal and right external iliac lymph nodes. 4. Stable mixed lytic and sclerotic osseous metastatic disease. No new or progressive findings. 5. Stable emphysematous changes and pulmonary scarring. 6. Stable advanced atherosclerotic calcifications involving the thoracic and abdominal aorta and iliac arteries. Aortic Atherosclerosis (ICD10-I70.0) and Emphysema (ICD10-J43.9). Electronically Signed   By: Marijo Sanes M.D.   On: 09/03/2019 12:33   CT Abdomen Pelvis W Contrast  Result Date: 09/03/2019 CLINICAL DATA:  Non-small cell lung cancer staging. EXAM: CT CHEST, ABDOMEN, AND PELVIS WITH CONTRAST TECHNIQUE: Multidetector CT imaging of the chest, abdomen and pelvis was performed following the standard protocol during bolus administration of intravenous contrast. CONTRAST:  132m OMNIPAQUE IOHEXOL 300 MG/ML  SOLN COMPARISON:  06/07/2019 FINDINGS: CT CHEST FINDINGS Cardiovascular: The heart is normal in size. No pericardial effusion. Stable aortic and coronary artery calcifications. No aortic aneurysm or dissection. Mediastinum/Nodes: Prevascular lymph node on image 24 measures 6 mm and  previously measured 7 mm. Stable 11 mm subcarinal lymph node. The esophagus is unremarkable. Lungs/Pleura: Stable changes of radiation fibrosis surrounding the left lower lobe lesion. This is unchanged measuring approximately 3.5 x 2.2 cm. 5 mm lesion in the left upper lobe on image 81/6 previously measured 8 mm. 16 x 12 mm right middle lobe nodule previously measured 20 x 15 mm. No new pulmonary nodules. Stable underlying emphysematous changes and pulmonary scarring. Stable right middle lobe calcified granuloma. No pleural effusions or pleural nodules. Musculoskeletal: Stable appearing mixed lytic and sclerotic osseous metastatic disease mainly involving the spine. No new or progressive findings. CT ABDOMEN PELVIS FINDINGS Hepatobiliary: No focal hepatic lesions to suggest metastatic disease. Small calcified granuloma noted in the right hepatic lobe. The gallbladder is mildly contracted. No common bile duct dilatation. Pancreas: No mass, inflammation or ductal dilatation. Spleen: Normal size. No focal lesions. Adrenals/Urinary Tract: The adrenal glands and kidneys are unremarkable and stable. Stable simple right renal cyst. The bladder is unremarkable. Stomach/Bowel: Stomach, duodenum, small bowel and colon are unremarkable. No acute inflammatory changes, mass lesions or obstructive findings. Vascular/Lymphatic: Stable age advanced atherosclerotic calcifications involving the aorta and iliac arteries. No aneurysm. The major venous structures are patent. Several small scattered retroperitoneal lymph nodes are unchanged. 8 mm right external iliac lymph node on image 97/2 previously measured 9 mm. Reproductive: The prostate gland and seminal vesicles are  unremarkable. Other: Small bilateral inguinal hernias containing fat. No inguinal adenopathy. Small scattered inguinal lymph nodes are stable. Musculoskeletal: Stable mixed lytic and sclerotic metastatic bone disease involving the lumbar spine and pelvis. No new or  progressive findings. IMPRESSION: 1. Stable changes of radiation fibrosis involving the left lower lobe. 2. Slight interval decrease in size of the right middle lobe pulmonary nodule and left upper lobe pulmonary nodule. No new or progressive findings. 3. Stable mediastinal, retroperitoneal and right external iliac lymph nodes. 4. Stable mixed lytic and sclerotic osseous metastatic disease. No new or progressive findings. 5. Stable emphysematous changes and pulmonary scarring. 6. Stable advanced atherosclerotic calcifications involving the thoracic and abdominal aorta and iliac arteries. Aortic Atherosclerosis (ICD10-I70.0) and Emphysema (ICD10-J43.9). Electronically Signed   By: Marijo Sanes M.D.   On: 09/03/2019 12:33   CARDIAC EVENT MONITOR  Result Date: 08/15/2019 1: Sinus rhythm/sinus tachycardia/sinus bradycardia (rates in the low 40s) 2: Runs of SVT and nonsustained ventricular tachycardia. 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  He is status post palliative radiotherapy to the metastatic disease in the left arm. The patient is currently undergoing treatment with target therapy with Tagrisso 80 mg p.o. daily started 5 months ago. The patient has been tolerating this treatment well with no concerning adverse effects. He had repeat CT scan of the chest, abdomen pelvis performed recently.  I personally and independently reviewed the scan images and discussed the result and showed the images to the patient today. Has a scan showed  continuous improvement of his disease with no concerning findings for progression. I recommended for the patient to continue his current treatment with Tagrisso with the same dose. I will see him back for follow-up visit in 1 months for evaluation with repeat blood work. For hypertension he was advised to monitor his blood pressure closely at home and to take his medication as prescribed. The patient was advised to call immediately if he has any concerning symptoms in the interval. The patient voices understanding of current disease status and treatment options and is in agreement with the current care plan. All questions were answered. The patient knows to call the clinic with any problems, questions or concerns. We can certainly see the patient much sooner if necessary.   Disclaimer: This note was dictated with voice recognition software. Similar sounding words can inadvertently be transcribed and may not be corrected upon review.

## 2019-09-25 NOTE — Telephone Encounter (Addendum)
Patient is approved for Tagrisso at $0 from [place] until 07/04/20  [place] Patient [place] Phone [phone] Fax [phone] 09/25/2019 8:43 AM

## 2019-10-03 ENCOUNTER — Other Ambulatory Visit: Payer: Self-pay

## 2019-10-03 ENCOUNTER — Inpatient Hospital Stay: Payer: Medicare Other | Admitting: Internal Medicine

## 2019-10-03 ENCOUNTER — Encounter: Payer: Self-pay | Admitting: Internal Medicine

## 2019-10-03 ENCOUNTER — Telehealth: Payer: Self-pay | Admitting: Internal Medicine

## 2019-10-03 ENCOUNTER — Inpatient Hospital Stay: Payer: Medicare Other

## 2019-10-03 VITALS — BP 127/52 | HR 57 | Temp 97.8°F | Resp 18 | Ht 67.0 in | Wt 178.0 lb

## 2019-10-03 DIAGNOSIS — C3492 Malignant neoplasm of unspecified part of left bronchus or lung: Secondary | ICD-10-CM

## 2019-10-03 DIAGNOSIS — Z5111 Encounter for antineoplastic chemotherapy: Secondary | ICD-10-CM

## 2019-10-03 DIAGNOSIS — C3432 Malignant neoplasm of lower lobe, left bronchus or lung: Secondary | ICD-10-CM | POA: Diagnosis not present

## 2019-10-03 DIAGNOSIS — I10 Essential (primary) hypertension: Secondary | ICD-10-CM | POA: Diagnosis not present

## 2019-10-03 LAB — CMP (CANCER CENTER ONLY)
ALT: 15 U/L (ref 0–44)
AST: 17 U/L (ref 15–41)
Albumin: 3.6 g/dL (ref 3.5–5.0)
Alkaline Phosphatase: 84 U/L (ref 38–126)
Anion gap: 10 (ref 5–15)
BUN: 17 mg/dL (ref 8–23)
CO2: 26 mmol/L (ref 22–32)
Calcium: 9 mg/dL (ref 8.9–10.3)
Chloride: 108 mmol/L (ref 98–111)
Creatinine: 1.31 mg/dL — ABNORMAL HIGH (ref 0.61–1.24)
GFR, Est AFR Am: >60 mL/min (ref >60)
GFR, Estimated: 55 mL/min — ABNORMAL LOW (ref >60)
Glucose, Bld: 98 mg/dL (ref 70–99)
Potassium: 5 mmol/L (ref 3.5–5.1)
Sodium: 144 mmol/L (ref 135–145)
Total Bilirubin: 0.4 mg/dL (ref 0.3–1.2)
Total Protein: 6.2 g/dL — ABNORMAL LOW (ref 6.5–8.1)

## 2019-10-03 LAB — CBC WITH DIFFERENTIAL (CANCER CENTER ONLY)
Abs Immature Granulocytes: 0.02 10*3/uL (ref 0.00–0.07)
Basophils Absolute: 0 10*3/uL (ref 0.0–0.1)
Basophils Relative: 1 %
Eosinophils Absolute: 0.1 10*3/uL (ref 0.0–0.5)
Eosinophils Relative: 1 %
HCT: 41.7 % (ref 39.0–52.0)
Hemoglobin: 13.6 g/dL (ref 13.0–17.0)
Immature Granulocytes: 0 %
Lymphocytes Relative: 22 %
Lymphs Abs: 1.3 10*3/uL (ref 0.7–4.0)
MCH: 32.3 pg (ref 26.0–34.0)
MCHC: 32.6 g/dL (ref 30.0–36.0)
MCV: 99 fL (ref 80.0–100.0)
Monocytes Absolute: 0.6 10*3/uL (ref 0.1–1.0)
Monocytes Relative: 11 %
Neutro Abs: 3.7 10*3/uL (ref 1.7–7.7)
Neutrophils Relative %: 65 %
Platelet Count: 137 10*3/uL — ABNORMAL LOW (ref 150–400)
RBC: 4.21 MIL/uL — ABNORMAL LOW (ref 4.22–5.81)
RDW: 13.9 % (ref 11.5–15.5)
WBC Count: 5.7 10*3/uL (ref 4.0–10.5)
nRBC: 0 % (ref 0.0–0.2)

## 2019-10-03 NOTE — Telephone Encounter (Signed)
Scheduled appts per 3/31 los. Gave pt a print out of AVS.

## 2019-10-03 NOTE — Progress Notes (Signed)
[place] [phone]   [phone]) [phone]  OFFICE PROGRESS NOTE  Aura Dials, MD [place] [address]  DIAGNOSIS: Stage IV (T3, N0, M1c) non-small cell lung cancer, adenocarcinoma presented with cavitary left lower lobe lung mass in addition to other pulmonary nodules and scattered hypermetabolic osseous metastatic disease involving the spine, ribs and bony pelvis as well as left proximal humeral pathologic fracture.   Molecular Biomarkers performed by [employer]S) / [name]      [id number]  Afatinib, Dacomitinib, Erlotinib, Gefitinib, Osimertinib, Ramucirumab Neratinib  FGFR1Amplification  Erdafitinib, Lenvatinib, Nintedanib, Pazopanib, Pemigatinib, Ponatinib  [id number] None  PRIOR THERAPY: None  CURRENT THERAPY:  1) Palliative radiotherapy to the the left humerus under the care of Dr. Sondra Come. Last treatment expected on 04/24/2019 2) Targeted therapy with Tagrisso 80 p.o. daily.  He started the first dose on April 21, 2019.  Status post 5 months of treatment.  INTERVAL HISTORY: Albert Perez 69 y.o. male returns to the clinic today for follow-up visit.  The patient is feeling fine today with no concerning complaints.  He denied having any fatigue or weakness.  He denied having any skin rash or diarrhea.  He has no nausea, vomiting, constipation or abdominal pain he denied having any recent weight loss or night sweats.  He has no headache or visual changes.  He has been tolerating his treatment with Tagrisso fairly well.  He is here today for evaluation and repeat blood work.  MEDICAL HISTORY: Past Medical History:  Diagnosis Date  . A-fib [facility]    PMH: 2000-2001  . Arthritis   . Cancer ([place])    melanoma 2018  . DVT (deep venous thrombosis) ([facility])    Left brachial vein DVT (01/30/19, following fracture)  . ETOH abuse   . GERD (gastroesophageal reflux disease)   . Lung  cancer ([place]) dx'd 02/26/2019  . Metastatic cancer to bone ([place]) dx'd 02/26/2019  . Sleep apnea    do not wear CPAP  . Stroke [facility])    " mini"  . Vertigo   . Wears glasses     ALLERGIES:  has No Known Allergies.  MEDICATIONS:  Current Outpatient Medications  Medication Sig Dispense Refill  . acetaminophen (TYLENOL) 650 MG CR tablet Take 1,300 mg by mouth every 8 (eight) hours as needed for pain.    Marland Kitchen aspirin 81 MG tablet Take 1 tablet (81 mg total) by mouth daily.    . carvedilol (COREG) 6.25 MG tablet Take 1 tablet (6.25 mg total) by mouth 2 (two) times daily. 60 tablet 3  . Cyanocobalamin (B-12 PO) Take 1,000 mg by mouth daily.     . meloxicam (MOBIC) 15 MG tablet Take 15 mg by mouth daily.    . Multiple Vitamin (MULTIVITAMIN PO) Take by mouth daily. Centrum    . Omega-3 Fatty Acids (OMEGA-3 PO) Take 1,200 mg by mouth daily.     Marland Kitchen osimertinib mesylate (TAGRISSO) 80 MG tablet Take 1 tablet (80 mg total) by mouth daily. 30 tablet 2  . rosuvastatin (CRESTOR) 5 MG tablet Take 5 mg by mouth daily.  2   No current facility-administered medications for this visit.    SURGICAL HISTORY:  Past Surgical History:  Procedure Laterality Date  . ANKLE FRACTURE SURGERY Right 2003  . COLONOSCOPY  2018  . ELBOW SURGERY Left 1964  . melanoma removal  04/2017   Lt shoulder  . RETINAL DETACHMENT SURGERY Right  [place]    . VIDEO BRONCHOSCOPY WITH ENDOBRONCHIAL NAVIGATION N/A 03/28/2019   Procedure: VIDEO BRONCHOSCOPY WITH ENDOBRONCHIAL NAVIGATION and Biopsies;  Surgeon: Collene Gobble, MD;  Location: [facility] OR;  Service: Thoracic;  Laterality: N/A;    REVIEW OF SYSTEMS:  A comprehensive review of systems was negative.   PHYSICAL EXAMINATION: General appearance: alert, cooperative and no distress Head: Normocephalic, without obvious abnormality, atraumatic Neck: no adenopathy, no JVD, supple, symmetrical, trachea midline and thyroid not enlarged, symmetric, no  tenderness/mass/nodules Lymph nodes: Cervical, supraclavicular, and axillary nodes normal. Resp: clear to auscultation bilaterally Back: symmetric, no curvature. ROM normal. No CVA tenderness. Cardio: regular rate and rhythm, S1, S2 normal, no murmur, click, rub or gallop GI: soft, non-tender; bowel sounds normal; no masses,  no organomegaly Extremities: extremities normal, atraumatic, no cyanosis or edema   ECOG PERFORMANCE STATUS: 1 - Symptomatic but completely ambulatory  Blood pressure (!) 127/52, pulse (!) 57, temperature 97.8 F (36.6 C), temperature source Temporal, resp. rate 18, height '5\' 7"'  (1.702 m), weight 178 lb (80.7 kg), SpO2 100 %.  LABORATORY DATA: Lab Results  Component Value Date   WBC 5.7 10/03/2019   HGB 13.6 10/03/2019   HCT 41.7 10/03/2019   MCV 99.0 10/03/2019   PLT 137 (L) 10/03/2019      Chemistry      Component Value Date/Time   NA 144 10/03/2019 1055   K 5.0 10/03/2019 1055   CL 108 10/03/2019 1055   CO2 26 10/03/2019 1055   BUN 17 10/03/2019 1055   CREATININE 1.31 (H) 10/03/2019 1055      Component Value Date/Time   CALCIUM 9.0 10/03/2019 1055   ALKPHOS 84 10/03/2019 1055   AST 17 10/03/2019 1055   ALT 15 10/03/2019 1055   BILITOT 0.4 10/03/2019 1055       RADIOGRAPHIC STUDIES: No results found.  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  He is status post palliative radiotherapy to the metastatic disease in the left arm. The patient is currently undergoing treatment with target therapy with Tagrisso 80 mg p.o. daily started 6 months ago. The  patient continues to tolerate this treatment well with no concerning adverse effects. His blood work is unremarkable today for any concerning findings. I recommended for him to continue his current treatment with Tagrisso with the same dose. I will see him back for follow-up visit in 1 months for evaluation and repeat blood work. He was advised to call immediately if he has any concerning symptoms in the interval. The patient voices understanding of current disease status and treatment options and is in agreement with the current care plan. All questions were answered. The patient knows to call the clinic with any problems, questions or concerns. We can certainly see the patient much sooner if necessary.   Disclaimer: This note was dictated with voice recognition software. Similar sounding words can inadvertently be transcribed and may not be corrected upon review.

## 2019-10-18 ENCOUNTER — Other Ambulatory Visit: Payer: Self-pay | Admitting: Cardiology

## 2019-10-24 ENCOUNTER — Other Ambulatory Visit: Payer: Self-pay | Admitting: Medical Oncology

## 2019-10-24 DIAGNOSIS — C3492 Malignant neoplasm of unspecified part of left bronchus or lung: Secondary | ICD-10-CM

## 2019-10-24 MED ORDER — OSIMERTINIB MESYLATE 80 MG PO TABS: 80.0000 mg | ORAL_TABLET | Freq: Every day | ORAL | 2 refills | Status: DC

## 2019-10-29 ENCOUNTER — Encounter: Payer: Self-pay | Admitting: Cardiology

## 2019-10-29 ENCOUNTER — Other Ambulatory Visit: Payer: Self-pay

## 2019-10-29 ENCOUNTER — Ambulatory Visit: Payer: Medicare Other | Admitting: Cardiology

## 2019-10-29 VITALS — BP 156/78 | HR 55 | Ht 67.5 in | Wt 179.0 lb

## 2019-10-29 DIAGNOSIS — I471 Supraventricular tachycardia: Secondary | ICD-10-CM

## 2019-10-29 NOTE — Progress Notes (Signed)
Electrophysiology Office Note   Date:  10/29/2019   ID:  Albert Perez, DOB [birth year], MRN [id number]  PCP:  Aura Dials, MD  Cardiologist:  Gwenlyn Found Primary Electrophysiologist:  Zora Glendenning [name], MD    Chief Complaint: SVT   History of Present Illness: Albert Perez is a 69 y.o. male who is being seen today for the evaluation of SVT at the request of Aura Dials, MD. Presenting today for electrophysiology evaluation.  He has a history of atrial fibrillation, DVT, alcohol abuse, OSA not on CPAP, CVA.  He has a diagnosis of lung cancer and is down to 3 cigarettes a day.  He had a brief episode of atrial fibrillation in the year 2000 related to increased stress, alcohol, and caffeine.  He changed his lifestyle and thus this was stopped.  He is not anticoagulated.  He has had fairly frequent tachypalpitations that are self-limited.  He was seen in the emergency room for palpitations and converted with Valsalva.  He had palpitations once or twice a week.  He has now been started on carvedilol.  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  Today, denies symptoms of palpitations, chest pain, shortness of breath, orthopnea, PND, lower extremity edema, claudication, dizziness, presyncope, syncope, bleeding, or neurologic sequela. The patient is tolerating medications without difficulties.  Overall he is doing well.  He has no chest pain or shortness of breath.  He is tolerating it is without issue.   Past Medical History:  Diagnosis Date  . A-fib [facility])    PMH: 2000-2001  . Arthritis   . Cancer ([place])    melanoma 2018  . DVT (deep venous thrombosis) (HCC)    Left brachial vein DVT (01/30/19, following fracture)  . ETOH abuse   . GERD (gastroesophageal reflux disease)   . Lung cancer ([place]) dx'd 02/26/2019  . Metastatic cancer to bone ([place]) dx'd 02/26/2019  . Sleep apnea    do not wear CPAP  . Stroke [facility])    " mini"  . Vertigo   .  Wears glasses    Past Surgical History:  Procedure Laterality Date  . ANKLE FRACTURE SURGERY Right 2003  . COLONOSCOPY  2018  . ELBOW SURGERY Left 1964  . melanoma removal  04/2017   Lt shoulder  . RETINAL DETACHMENT SURGERY Right 1971  . TONSILLECTOMY    . VIDEO BRONCHOSCOPY WITH ENDOBRONCHIAL NAVIGATION N/A 03/28/2019   Procedure: VIDEO BRONCHOSCOPY WITH ENDOBRONCHIAL NAVIGATION and Biopsies;  Surgeon: Collene Gobble, MD;  Location: [facility] OR;  Service: Thoracic;  Laterality: N/A;     Current Outpatient Medications  Medication Sig Dispense Refill  . acetaminophen (TYLENOL) 650 MG CR tablet Take 1,300 mg by mouth every 8 (eight) hours as needed for pain.    Marland Kitchen aspirin 81 MG tablet Take 1 tablet (81 mg total) by mouth daily.    . carvedilol (COREG) 6.25 MG tablet TAKE 1 TABLET BY MOUTH TWICE A DAY 180 tablet 2  . Cyanocobalamin (B-12 PO) Take 1,000 mg by mouth daily.     . meloxicam (MOBIC) 15 MG tablet Take 15 mg by mouth daily.    . Multiple Vitamin (MULTIVITAMIN PO) Take by mouth daily. Centrum    . Omega-3 Fatty Acids (OMEGA-3 PO) Take 1,200 mg by mouth daily.     Marland Kitchen osimertinib mesylate (TAGRISSO) 80 MG tablet Take 1 tablet (80 mg total) by mouth daily. 30 tablet 2  . rosuvastatin (CRESTOR) 5 MG  tablet Take 5 mg by mouth daily.  2   No current facility-administered medications for this visit.    Allergies:   Patient has no known allergies.   Social History:  The patient  reports that he quit smoking about 7 months ago. His smoking use included cigarettes. He has a 10.00 pack-year smoking history. He has never used smokeless tobacco. He reports current alcohol use of about 24.0 standard drinks of alcohol per week. He reports current drug use. Drug: Marijuana.   Family History:  The patient's family history includes Aneurysm in his mother; Colon cancer in his brother; Prostate cancer in his brother.    ROS:  Please see the history of present illness.   Otherwise, review of systems  is positive for none.   All other systems are reviewed and negative.   PHYSICAL EXAM: VS:  BP (!) 156/78   Pulse (!) 55   Ht 5' 7.5" (1.715 m)   Wt 179 lb (81.2 kg)   BMI 27.62 kg/m  , BMI Body mass index is 27.62 kg/m. GEN: Well nourished, well developed, in no acute distress  HEENT: normal  Neck: no JVD, carotid bruits, or masses Cardiac: RRR; no murmurs, rubs, or gallops,no edema  Respiratory:  clear to auscultation bilaterally, normal work of breathing GI: soft, nontender, nondistended, + BS MS: no deformity or atrophy  Skin: warm and dry Neuro:  Strength and sensation are intact Psych: euthymic mood, full affect  EKG:  EKG is ordered today. Personal review of the ekg ordered shows sinus rhythm, rate 55  Recent Labs: 06/04/2019: Magnesium 2.0 10/03/2019: ALT 15; BUN 17; Creatinine 1.31; Hemoglobin 13.6; Platelet Count 137; Potassium 5.0; Sodium 144    Lipid Panel  No results found for: CHOL, TRIG, HDL, CHOLHDL, VLDL, LDLCALC, LDLDIRECT   Wt Readings from Last 3 Encounters:  10/29/19 179 lb (81.2 kg)  10/03/19 178 lb (80.7 kg)  09/05/19 181 lb 11.2 oz (82.4 kg)      Other studies Reviewed: Additional studies/ records that were reviewed today include: TTE 11/16/17  Review of the above records today demonstrates:  - Left ventricle: The cavity size was normal. Wall thickness was   normal. Systolic function was normal. The estimated ejection   fraction was in the range of 55% to 60%. Wall motion was normal;   there were no regional wall motion abnormalities. Doppler   parameters are consistent with abnormal left ventricular   relaxation (grade 1 diastolic dysfunction). - Right atrium: The atrium was mildly dilated.   ASSESSMENT AND PLAN:  1.  SVT: Found in the emergency room 06/04/2019.  Due to the coronavirus pandemic, he was not ablated.  Currently on carvedilol.  No SVT.  No changes.  2.  Hypertension: Mildly elevated today but usually well controlled.  No  changes.  3.  Obstructive sleep apnea: CPAP compliance encouraged  4.  Tobacco abuse: Cessation encouraged  5.  Atrial fibrillation: 1 episode in 2000.  Was thought due to stress, caffeine, alcohol.  No recurrence.    Current medicines are reviewed at length with the patient today.   The patient does not have concerns regarding his medicines.  The following changes were made today: None  Labs/ tests ordered today include:  Orders Placed This Encounter  Procedures  . EKG 12-Lead     Disposition:   FU with Fitzhugh Vizcarrondo 12 months  Signed, Kirbie Stodghill [name], MD  10/29/2019 10:43 AM     [address]  [address] [place] [address] [phone] (office) [phone] (fax)

## 2019-11-05 ENCOUNTER — Encounter: Payer: Self-pay | Admitting: Internal Medicine

## 2019-11-05 ENCOUNTER — Inpatient Hospital Stay: Payer: Medicare Other

## 2019-11-05 ENCOUNTER — Inpatient Hospital Stay: Payer: Medicare Other | Attending: Internal Medicine | Admitting: Internal Medicine

## 2019-11-05 ENCOUNTER — Other Ambulatory Visit: Payer: Self-pay

## 2019-11-05 ENCOUNTER — Telehealth: Payer: Self-pay | Admitting: Internal Medicine

## 2019-11-05 VITALS — BP 140/58 | HR 55 | Temp 98.0°F | Resp 20 | Ht 67.5 in | Wt 176.9 lb

## 2019-11-05 DIAGNOSIS — Z8673 Personal history of transient ischemic attack (TIA), and cerebral infarction without residual deficits: Secondary | ICD-10-CM | POA: Insufficient documentation

## 2019-11-05 DIAGNOSIS — Z7982 Long term (current) use of aspirin: Secondary | ICD-10-CM | POA: Insufficient documentation

## 2019-11-05 DIAGNOSIS — Z85820 Personal history of malignant melanoma of skin: Secondary | ICD-10-CM | POA: Insufficient documentation

## 2019-11-05 DIAGNOSIS — Z86718 Personal history of other venous thrombosis and embolism: Secondary | ICD-10-CM | POA: Diagnosis not present

## 2019-11-05 DIAGNOSIS — Z791 Long term (current) use of non-steroidal anti-inflammatories (NSAID): Secondary | ICD-10-CM | POA: Insufficient documentation

## 2019-11-05 DIAGNOSIS — C3412 Malignant neoplasm of upper lobe, left bronchus or lung: Secondary | ICD-10-CM | POA: Diagnosis not present

## 2019-11-05 DIAGNOSIS — C7951 Secondary malignant neoplasm of bone: Secondary | ICD-10-CM | POA: Diagnosis present

## 2019-11-05 DIAGNOSIS — I10 Essential (primary) hypertension: Secondary | ICD-10-CM | POA: Diagnosis not present

## 2019-11-05 DIAGNOSIS — Z79899 Other long term (current) drug therapy: Secondary | ICD-10-CM | POA: Diagnosis not present

## 2019-11-05 DIAGNOSIS — Z5111 Encounter for antineoplastic chemotherapy: Secondary | ICD-10-CM

## 2019-11-05 DIAGNOSIS — I4891 Unspecified atrial fibrillation: Secondary | ICD-10-CM | POA: Diagnosis not present

## 2019-11-05 DIAGNOSIS — C3492 Malignant neoplasm of unspecified part of left bronchus or lung: Secondary | ICD-10-CM

## 2019-11-05 DIAGNOSIS — K219 Gastro-esophageal reflux disease without esophagitis: Secondary | ICD-10-CM | POA: Insufficient documentation

## 2019-11-05 DIAGNOSIS — Z720 Tobacco use: Secondary | ICD-10-CM

## 2019-11-05 DIAGNOSIS — G4730 Sleep apnea, unspecified: Secondary | ICD-10-CM | POA: Insufficient documentation

## 2019-11-05 DIAGNOSIS — C3432 Malignant neoplasm of lower lobe, left bronchus or lung: Secondary | ICD-10-CM | POA: Diagnosis present

## 2019-11-05 DIAGNOSIS — C3490 Malignant neoplasm of unspecified part of unspecified bronchus or lung: Secondary | ICD-10-CM | POA: Diagnosis not present

## 2019-11-05 LAB — CMP (CANCER CENTER ONLY)
ALT: 21 U/L (ref 0–44)
AST: 22 U/L (ref 15–41)
Albumin: 4 g/dL (ref 3.5–5.0)
Alkaline Phosphatase: 94 U/L (ref 38–126)
Anion gap: 8 (ref 5–15)
BUN: 19 mg/dL (ref 8–23)
CO2: 27 mmol/L (ref 22–32)
Calcium: 9.4 mg/dL (ref 8.9–10.3)
Chloride: 107 mmol/L (ref 98–111)
Creatinine: 1.28 mg/dL — ABNORMAL HIGH (ref 0.61–1.24)
GFR, Est AFR Am: >60 mL/min (ref >60)
GFR, Estimated: 57 mL/min — ABNORMAL LOW (ref >60)
Glucose, Bld: 104 mg/dL — ABNORMAL HIGH (ref 70–99)
Potassium: 4.9 mmol/L (ref 3.5–5.1)
Sodium: 142 mmol/L (ref 135–145)
Total Bilirubin: 0.9 mg/dL (ref 0.3–1.2)
Total Protein: 6.9 g/dL (ref 6.5–8.1)

## 2019-11-05 LAB — CBC WITH DIFFERENTIAL (CANCER CENTER ONLY)
Abs Immature Granulocytes: 0.02 10*3/uL (ref 0.00–0.07)
Basophils Absolute: 0 10*3/uL (ref 0.0–0.1)
Basophils Relative: 0 %
Eosinophils Absolute: 0.1 10*3/uL (ref 0.0–0.5)
Eosinophils Relative: 2 %
HCT: 42.7 % (ref 39.0–52.0)
Hemoglobin: 14.1 g/dL (ref 13.0–17.0)
Immature Granulocytes: 0 %
Lymphocytes Relative: 23 %
Lymphs Abs: 1.5 10*3/uL (ref 0.7–4.0)
MCH: 32.7 pg (ref 26.0–34.0)
MCHC: 33 g/dL (ref 30.0–36.0)
MCV: 99.1 fL (ref 80.0–100.0)
Monocytes Absolute: 0.8 10*3/uL (ref 0.1–1.0)
Monocytes Relative: 14 %
Neutro Abs: 3.8 10*3/uL (ref 1.7–7.7)
Neutrophils Relative %: 61 %
Platelet Count: 168 10*3/uL (ref 150–400)
RBC: 4.31 MIL/uL (ref 4.22–5.81)
RDW: 14 % (ref 11.5–15.5)
WBC Count: 6.2 10*3/uL (ref 4.0–10.5)
nRBC: 0 % (ref 0.0–0.2)

## 2019-11-05 NOTE — Telephone Encounter (Signed)
Scheduled los 5/3. Gave pt AVS and printed out appts.

## 2019-11-05 NOTE — Progress Notes (Signed)
[facility] [phone]   [phone]  OFFICE PROGRESS NOTE  Aura Dials, MD [place] [address]  DIAGNOSIS: Stage IV (T3, N0, M1c) non-small cell lung cancer, adenocarcinoma presented with cavitary left lower lobe lung mass in addition to other pulmonary nodules and scattered hypermetabolic osseous metastatic disease involving the spine, ribs and bony pelvis as well as left proximal humeral pathologic fracture.   Molecular Biomarkers performed by [facility] [facility]TER[facility](S) / [name]      [id number]  Afatinib, Dacomitinib, Erlotinib, Gefitinib, Osimertinib, Ramucirumab Neratinib  FGFR1Amplification  Erdafitinib, Lenvatinib, Nintedanib, Pazopanib, Pemigatinib, Ponatinib  [id number] None  PRIOR THERAPY: None  CURRENT THERAPY:  1) Palliative radiotherapy to the the left humerus under the care of Dr. Sondra Come. Last treatment expected on 04/24/2019 2) Targeted therapy with Tagrisso 80 p.o. daily.  He started the first dose on April 21, 2019.  Status post 7 months of treatment.  INTERVAL HISTORY: Albert Perez 69 y.o. male returns to the clinic today for follow-up visit.  The patient is feeling fine today with no concerning complaints.  He denied having any chest pain, shortness of breath, cough or hemoptysis.  He denied having any fever or chills.  He has no nausea, vomiting, diarrhea or constipation.  He denied having any headache or visual changes.  He has been tolerating his treatment with Tagrisso fairly well.  MEDICAL HISTORY: Past Medical History:  Diagnosis Date  . A-fib [facility])    PMH: 2000-2001  . Arthritis   . Cancer ([place])    melanoma 2018  . DVT (deep venous thrombosis) (HCC)    Left brachial vein DVT (01/30/19, following fracture)  . ETOH abuse   . GERD (gastroesophageal reflux disease)   . Lung cancer ([place]) dx'd 02/26/2019  . Metastatic cancer to bone ([place]) dx'd 02/26/2019  .  Sleep apnea    do not wear CPAP  . Stroke [facility])    " mini"  . Vertigo   . Wears glasses     ALLERGIES:  has No Known Allergies.  MEDICATIONS:  Current Outpatient Medications  Medication Sig Dispense Refill  . aspirin 81 MG tablet Take 1 tablet (81 mg total) by mouth daily.    . carvedilol (COREG) 6.25 MG tablet TAKE 1 TABLET BY MOUTH TWICE A DAY 180 tablet 2  . Cyanocobalamin (B-12 PO) Take 1,000 mg by mouth daily.     . Multiple Vitamin (MULTIVITAMIN PO) Take by mouth daily. Centrum    . Omega-3 Fatty Acids (OMEGA-3 PO) Take 1,200 mg by mouth daily.     Marland Kitchen osimertinib mesylate (TAGRISSO) 80 MG tablet Take 1 tablet (80 mg total) by mouth daily. 30 tablet 2  . rosuvastatin (CRESTOR) 5 MG tablet Take 5 mg by mouth daily.  2  . acetaminophen (TYLENOL) 650 MG CR tablet Take 1,300 mg by mouth every 8 (eight) hours as needed for pain.    . meloxicam (MOBIC) 15 MG tablet Take 15 mg by mouth daily.     No current facility-administered medications for this visit.    SURGICAL HISTORY:  Past Surgical History:  Procedure Laterality Date  . ANKLE FRACTURE SURGERY Right 2003  . COLONOSCOPY  2018  . ELBOW SURGERY Left 1964  . melanoma removal  04/2017   Lt shoulder  . RETINAL DETACHMENT SURGERY Right 1971  . TONSILLECTOMY    . VIDEO BRONCHOSCOPY WITH ENDOBRONCHIAL NAVIGATION N/A 03/28/2019   Procedure: VIDEO BRONCHOSCOPY WITH  ENDOBRONCHIAL NAVIGATION and Biopsies;  Surgeon: Collene Gobble, MD;  Location: [facility] OR;  Service: Thoracic;  Laterality: N/A;    REVIEW OF SYSTEMS:  A comprehensive review of systems was negative.   PHYSICAL EXAMINATION: General appearance: alert, cooperative and no distress Head: Normocephalic, without obvious abnormality, atraumatic Neck: no adenopathy, no JVD, supple, symmetrical, trachea midline and thyroid not enlarged, symmetric, no tenderness/mass/nodules Lymph nodes: Cervical, supraclavicular, and axillary nodes normal. Resp: clear to auscultation  bilaterally Back: symmetric, no curvature. ROM normal. No CVA tenderness. Cardio: regular rate and rhythm, S1, S2 normal, no murmur, click, rub or gallop GI: soft, non-tender; bowel sounds normal; no masses,  no organomegaly Extremities: extremities normal, atraumatic, no cyanosis or edema   ECOG PERFORMANCE STATUS: 1 - Symptomatic but completely ambulatory  Blood pressure (!) 140/58, pulse (!) 55, temperature 98 F (36.7 C), temperature source Temporal, resp. rate 20, height 5' 7.5" (1.715 m), weight 176 lb 14.4 oz (80.2 kg), SpO2 100 %.  LABORATORY DATA: Lab Results  Component Value Date   WBC 6.2 11/05/2019   HGB 14.1 11/05/2019   HCT 42.7 11/05/2019   MCV 99.1 11/05/2019   PLT 168 11/05/2019      Chemistry      Component Value Date/Time   NA 144 10/03/2019 1055   K 5.0 10/03/2019 1055   CL 108 10/03/2019 1055   CO2 26 10/03/2019 1055   BUN 17 10/03/2019 1055   CREATININE 1.31 (H) 10/03/2019 1055      Component Value Date/Time   CALCIUM 9.0 10/03/2019 1055   ALKPHOS 84 10/03/2019 1055   AST 17 10/03/2019 1055   ALT 15 10/03/2019 1055   BILITOT 0.4 10/03/2019 1055       RADIOGRAPHIC STUDIES: No results found.  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  He is status post palliative radiotherapy to the metastatic disease in the left arm. The patient is currently undergoing treatment with target therapy with Tagrisso 80 mg p.o. daily started 7 months ago. The patient has been tolerating his treatment well with no concerning adverse effects. I recommended for him to continue  his current treatment with Tagrisso with the same dose. I will see him back for follow-up visit in 2 months for evaluation with repeat CT scan of the chest, abdomen pelvis for restaging of his disease. The patient was advised to call immediately if he has any concerning symptoms in the interval. The patient voices understanding of current disease status and treatment options and is in agreement with the current care plan. All questions were answered. The patient knows to call the clinic with any problems, questions or concerns. We can certainly see the patient much sooner if necessary.   Disclaimer: This note was dictated with voice recognition software. Similar sounding words can inadvertently be transcribed and may not be corrected upon review.

## 2019-11-20 ENCOUNTER — Encounter: Payer: Self-pay | Admitting: Internal Medicine

## 2019-11-21 ENCOUNTER — Other Ambulatory Visit: Payer: Self-pay | Admitting: Medical Oncology

## 2019-11-21 DIAGNOSIS — C3492 Malignant neoplasm of unspecified part of left bronchus or lung: Secondary | ICD-10-CM

## 2019-11-21 MED ORDER — OSIMERTINIB MESYLATE 80 MG PO TABS
80.0000 mg | ORAL_TABLET | Freq: Every day | ORAL | 2 refills | Status: DC
Start: 2019-11-21 — End: 2020-01-16

## 2019-11-27 ENCOUNTER — Telehealth: Payer: Self-pay | Admitting: Internal Medicine

## 2019-11-27 NOTE — Telephone Encounter (Signed)
Faxed medical records to [facility] at [phone], Release ID: [id number]

## 2019-12-05 ENCOUNTER — Ambulatory Visit: Payer: Medicare Other | Admitting: Cardiovascular Disease

## 2019-12-05 ENCOUNTER — Other Ambulatory Visit: Payer: Self-pay

## 2019-12-05 ENCOUNTER — Encounter: Payer: Self-pay | Admitting: Cardiovascular Disease

## 2019-12-05 DIAGNOSIS — E785 Hyperlipidemia, unspecified: Secondary | ICD-10-CM | POA: Insufficient documentation

## 2019-12-05 DIAGNOSIS — I471 Supraventricular tachycardia: Secondary | ICD-10-CM

## 2019-12-05 DIAGNOSIS — I10 Essential (primary) hypertension: Secondary | ICD-10-CM | POA: Diagnosis not present

## 2019-12-05 DIAGNOSIS — R931 Abnormal findings on diagnostic imaging of heart and coronary circulation: Secondary | ICD-10-CM

## 2019-12-05 DIAGNOSIS — E782 Mixed hyperlipidemia: Secondary | ICD-10-CM | POA: Diagnosis not present

## 2019-12-05 DIAGNOSIS — Z8679 Personal history of other diseases of the circulatory system: Secondary | ICD-10-CM | POA: Diagnosis not present

## 2019-12-05 NOTE — Progress Notes (Signed)
12/05/2019 Albert Perez   25-May-1951  [id number]  Primary Physician Aura Dials, MD Primary Cardiologist: Lorretta Harp MD FACP, [place], [place], [place]  HPI:  Albert Perez is a 69 y.o.  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 Regenia Skeeter for tachypalpitations.  I last saw him in the office 06/14/2019. His cardiac risk factors include long history tobacco abuse and treated hyperlipidemia.  There is no family history of heart disease.  He is never had a heart heart attack but did have a stroke in 2018 without neurologic deficits.  He denies chest pain or shortness of breath.  He does lift weights frequently.  He has 2 drinks of alcohol a night.  He smoked a half a pack a day for 50 years although he did stop for 22 years during that time.  He cut back to 3 cigarettes a day 6 months ago and is trying to stop.  He does have a diagnosis of lung cancer.  He does have obstructive sleep apnea but is intolerant to CPAP.  He had a brief episode of A. fib back in 2000 which he relates to increase stress, alcohol and caffeine.  He changed his lifestyle and this resolved.  He was not anticoagulated.  He does admit episodes of tachypalpitations that occur on a monthly basis that are fairly self-limited.  When he was seen in the emergency room for palpitations he appeared to be in SVT versus a flutter with 2 1 block and he converted with Valsalva.  Of note, he did have a chest CT performed on 06/07/2023 lung cancer screening that did show coronary calcification.  I performed a coronary calcium score on 06/25/2019 which was 640.  I also obtain an event monitor 06/26/2019 revealing episodes of SVT, PAF and nonsustained ventricular tachycardia.  I did refer him to Dr. Curt Bears for EP evaluation 10/29/2019 who began him on carvedilol.  His palpitations have  improved.  He did have a normal 2D echo back in 2019.  He denies chest pain or shortness of breath.   Current Meds  Medication Sig  . acetaminophen (TYLENOL) 650 MG CR tablet Take 1,300 mg by mouth every 8 (eight) hours as needed for pain.  Marland Kitchen aspirin 81 MG tablet Take 1 tablet (81 mg total) by mouth daily.  . carvedilol (COREG) 6.25 MG tablet TAKE 1 TABLET BY MOUTH TWICE A DAY  . Cyanocobalamin (B-12 PO) Take 1,000 mg by mouth daily.   . meloxicam (MOBIC) 15 MG tablet Take 15 mg by mouth daily.  . Multiple Vitamin (MULTIVITAMIN PO) Take by mouth daily. Centrum  . Omega-3 Fatty Acids (OMEGA-3 PO) Take 1,200 mg by mouth daily.   Marland Kitchen osimertinib mesylate (TAGRISSO) 80 MG tablet Take 1 tablet (80 mg total) by mouth daily.  . rosuvastatin (CRESTOR) 5 MG tablet Take 5 mg by mouth daily.     No Known Allergies  Social History   Socioeconomic History  . Marital status: Married    Spouse name: Not on file  . Number of children: 2  . Years of education: 20  . Highest education level: Some college, no degree  Occupational History  . Not on file  Tobacco Use  . Smoking status: Former Smoker    Packs/day: 0.50    Years: 20.00    Pack years: 10.00  Types: Cigarettes    Quit date: 03/06/2019    Years since quitting: 0.7  . Smokeless tobacco: Never Used  Substance and Sexual Activity  . Alcohol use: Yes    Alcohol/week: 24.0 standard drinks    Types: 24 Cans of beer per week    Comment: 3 -4 drinks of Vodka daily  . Drug use: Yes    Types: Marijuana    Comment: CBD oil  . Sexual activity: Not on file  Other Topics Concern  . Not on file  Social History Narrative   Lives at home with wife, daughter and grandson   Right handed   Caffeine: sodas 2 per day   Social Determinants of Health   Financial Resource Strain:   . Difficulty of Paying Living Expenses:   Food Insecurity:   . Worried About Charity fundraiser in the Last Year:   . Arboriculturist in the Last Year:     Transportation Needs:   . Film/video editor (Medical):   Marland Kitchen Lack of Transportation (Non-Medical):   Physical Activity:   . Days of Exercise per Week:   . Minutes of Exercise per Session:   Stress:   . Feeling of Stress :   Social Connections:   . Frequency of Communication with Friends and Family:   . Frequency of Social Gatherings with Friends and Family:   . Attends Religious Services:   . Active Member of Clubs or Organizations:   . Attends Archivist Meetings:   Marland Kitchen Marital Status:   Intimate Partner Violence:   . Fear of Current or Ex-Partner:   . Emotionally Abused:   Marland Kitchen Physically Abused:   . Sexually Abused:      Review of Systems: General: negative for chills, fever, night sweats or weight changes.  Cardiovascular: negative for chest pain, dyspnea on exertion, edema, orthopnea, palpitations, paroxysmal nocturnal dyspnea or shortness of breath Dermatological: negative for rash Respiratory: negative for cough or wheezing Urologic: negative for hematuria Abdominal: negative for nausea, vomiting, diarrhea, bright red blood per rectum, melena, or hematemesis Neurologic: negative for visual changes, syncope, or dizziness All other systems reviewed and are otherwise negative except as noted above.    Blood pressure 130/64, pulse (!) 54, height 5\' 7"  (1.702 m), weight 177 lb (80.3 kg), SpO2 99 %.  General appearance: alert and no distress Neck: no adenopathy, no carotid bruit, no JVD, supple, symmetrical, trachea midline and thyroid not enlarged, symmetric, no tenderness/mass/nodules Lungs: clear to auscultation bilaterally Heart: regular rate and rhythm, S1, S2 normal, no murmur, click, rub or gallop Extremities: extremities normal, atraumatic, no cyanosis or edema Pulses: 2+ and symmetric Skin: Skin color, texture, turgor normal. No rashes or lesions Neurologic: Alert and oriented X 3, normal strength and tone. Normal symmetric reflexes. Normal coordination  and gait  EKG not performed today  ASSESSMENT AND PLAN:   History of atrial fibrillation History of atrial fibrillation briefly back in 2000 and again on event monitoring 06/24/2019.  He did see Dr. Curt Bears in the office 10/29/2019 who began him on carvedilol.  His symptoms of palpitations have improved.  His event monitor also showed runs of SVT and nonsustained ventricular tachycardia.  He had normal LV function 2 years ago.  He is currently not on oral anticoagulant.  Essential hypertension History of essential hypertension blood pressure measured today 130/64.  He is on carvedilol.  SVT (supraventricular tachycardia) (HCC) Supraventricular tachycardia on event monitoring improved with the addition of carvedilol.  Hyperlipidemia History of hyperlipidemia on statin therapy with lipid profile performed 08/22/2018 revealing cholesterol 35, LDL 71 HDL 68.  Elevated coronary artery calcium score Coronary calcium score performed 06/25/2019 is measured at 640.  He is totally asymptomatic.  His LDL is in the 70 range.  We talked about the importance of smoking cessation.      Lorretta Harp MD FACP,FACC,FAHA, [facility] 12/05/2019 11:17 AM

## 2019-12-05 NOTE — Assessment & Plan Note (Signed)
Coronary calcium score performed 06/25/2019 is measured at 640.  He is totally asymptomatic.  His LDL is in the 70 range.  We talked about the importance of smoking cessation.

## 2019-12-05 NOTE — Assessment & Plan Note (Signed)
History of essential hypertension blood pressure measured today 130/64.  He is on carvedilol.

## 2019-12-05 NOTE — Patient Instructions (Signed)
Medication Instructions:  Your physician recommends that you continue on your current medications as directed. Please refer to the Current Medication list given to you today.  *If you need a refill on your cardiac medications before your next appointment, please call your pharmacy*  Follow-Up: At [facility], you and your health needs are our priority.  As part of our continuing mission to provide you with exceptional heart care, we have created designated Provider Care Teams.  These Care Teams include your primary Cardiologist (physician) and Advanced Practice Providers (APPs -  Physician Assistants and Nurse Practitioners) who all work together to provide you with the care you need, when you need it.  We recommend signing up for the patient portal called "MyChart".  Sign up information is provided on this After Visit Summary.  MyChart is used to connect with patients for Virtual Visits (Telemedicine).  Patients are able to view lab/test results, encounter notes, upcoming appointments, etc.  Non-urgent messages can be sent to your provider as well.   To learn more about what you can do with MyChart, go to [email or web address].    Your next appointment:   6 months with one of the following Advanced Practice Providers on your designated Care Team:  Kerin Ransom, PA-C [place], [place] Coletta Memos, [place]  12 months with Dr. Gwenlyn Found  The format for your next appointment:   In Person

## 2019-12-05 NOTE — Assessment & Plan Note (Signed)
History of hyperlipidemia on statin therapy with lipid profile performed 08/22/2018 revealing cholesterol 35, LDL 71 HDL 68.

## 2019-12-05 NOTE — Assessment & Plan Note (Signed)
Supraventricular tachycardia on event monitoring improved with the addition of carvedilol.

## 2019-12-05 NOTE — Assessment & Plan Note (Signed)
History of atrial fibrillation briefly back in 2000 and again on event monitoring 06/24/2019.  He did see Dr. Curt Bears in the office 10/29/2019 who began him on carvedilol.  His symptoms of palpitations have improved.  His event monitor also showed runs of SVT and nonsustained ventricular tachycardia.  He had normal LV function 2 years ago.  He is currently not on oral anticoagulant.

## 2020-01-04 ENCOUNTER — Ambulatory Visit (HOSPITAL_COMMUNITY)
Admission: RE | Admit: 2020-01-04 | Discharge: 2020-01-04 | Disposition: A | Discharge status: Home / Self Care | Payer: Medicare Other | Source: Ambulatory Visit | Attending: Internal Medicine | Admitting: Internal Medicine

## 2020-01-04 ENCOUNTER — Inpatient Hospital Stay: Payer: Medicare Other | Attending: Internal Medicine

## 2020-01-04 ENCOUNTER — Other Ambulatory Visit: Payer: Self-pay

## 2020-01-04 DIAGNOSIS — Z86718 Personal history of other venous thrombosis and embolism: Secondary | ICD-10-CM | POA: Diagnosis not present

## 2020-01-04 DIAGNOSIS — C7951 Secondary malignant neoplasm of bone: Secondary | ICD-10-CM | POA: Insufficient documentation

## 2020-01-04 DIAGNOSIS — Z85820 Personal history of malignant melanoma of skin: Secondary | ICD-10-CM | POA: Diagnosis not present

## 2020-01-04 DIAGNOSIS — C3490 Malignant neoplasm of unspecified part of unspecified bronchus or lung: Secondary | ICD-10-CM

## 2020-01-04 DIAGNOSIS — K219 Gastro-esophageal reflux disease without esophagitis: Secondary | ICD-10-CM | POA: Insufficient documentation

## 2020-01-04 DIAGNOSIS — C3432 Malignant neoplasm of lower lobe, left bronchus or lung: Secondary | ICD-10-CM | POA: Diagnosis present

## 2020-01-04 DIAGNOSIS — G4730 Sleep apnea, unspecified: Secondary | ICD-10-CM | POA: Diagnosis not present

## 2020-01-04 DIAGNOSIS — Z8673 Personal history of transient ischemic attack (TIA), and cerebral infarction without residual deficits: Secondary | ICD-10-CM | POA: Insufficient documentation

## 2020-01-04 DIAGNOSIS — Z79899 Other long term (current) drug therapy: Secondary | ICD-10-CM | POA: Diagnosis not present

## 2020-01-04 DIAGNOSIS — Z791 Long term (current) use of non-steroidal anti-inflammatories (NSAID): Secondary | ICD-10-CM | POA: Insufficient documentation

## 2020-01-04 DIAGNOSIS — Z7982 Long term (current) use of aspirin: Secondary | ICD-10-CM | POA: Insufficient documentation

## 2020-01-04 DIAGNOSIS — I4891 Unspecified atrial fibrillation: Secondary | ICD-10-CM | POA: Insufficient documentation

## 2020-01-04 LAB — CBC WITH DIFFERENTIAL (CANCER CENTER ONLY)
Abs Immature Granulocytes: 0.02 10*3/uL (ref 0.00–0.07)
Basophils Absolute: 0 10*3/uL (ref 0.0–0.1)
Basophils Relative: 0 %
Eosinophils Absolute: 0.1 10*3/uL (ref 0.0–0.5)
Eosinophils Relative: 2 %
HCT: 38.8 % — ABNORMAL LOW (ref 39.0–52.0)
Hemoglobin: 12.9 g/dL — ABNORMAL LOW (ref 13.0–17.0)
Immature Granulocytes: 0 %
Lymphocytes Relative: 22 %
Lymphs Abs: 1.6 10*3/uL (ref 0.7–4.0)
MCH: 32.4 pg (ref 26.0–34.0)
MCHC: 33.2 g/dL (ref 30.0–36.0)
MCV: 97.5 fL (ref 80.0–100.0)
Monocytes Absolute: 0.8 10*3/uL (ref 0.1–1.0)
Monocytes Relative: 12 %
Neutro Abs: 4.5 10*3/uL (ref 1.7–7.7)
Neutrophils Relative %: 64 %
Platelet Count: 151 10*3/uL (ref 150–400)
RBC: 3.98 MIL/uL — ABNORMAL LOW (ref 4.22–5.81)
RDW: 13.6 % (ref 11.5–15.5)
WBC Count: 7 10*3/uL (ref 4.0–10.5)
nRBC: 0 % (ref 0.0–0.2)

## 2020-01-04 LAB — CMP (CANCER CENTER ONLY)
ALT: 22 U/L (ref 0–44)
AST: 20 U/L (ref 15–41)
Albumin: 3.7 g/dL (ref 3.5–5.0)
Alkaline Phosphatase: 84 U/L (ref 38–126)
Anion gap: 9 (ref 5–15)
BUN: 16 mg/dL (ref 8–23)
CO2: 24 mmol/L (ref 22–32)
Calcium: 8.9 mg/dL (ref 8.9–10.3)
Chloride: 107 mmol/L (ref 98–111)
Creatinine: 1.29 mg/dL — ABNORMAL HIGH (ref 0.61–1.24)
GFR, Est AFR Am: >60 mL/min (ref >60)
GFR, Estimated: 56 mL/min — ABNORMAL LOW (ref >60)
Glucose, Bld: 84 mg/dL (ref 70–99)
Potassium: 4.5 mmol/L (ref 3.5–5.1)
Sodium: 140 mmol/L (ref 135–145)
Total Bilirubin: 0.6 mg/dL (ref 0.3–1.2)
Total Protein: 6.5 g/dL (ref 6.5–8.1)

## 2020-01-04 MED ORDER — IOHEXOL 300 MG/ML  SOLN
100.0000 mL | Freq: Once | INTRAMUSCULAR | Status: AC | PRN
  Administered 2020-01-04: 100 mL via INTRAVENOUS

## 2020-01-04 MED ORDER — SODIUM CHLORIDE (PF) 0.9 % IJ SOLN
INTRAMUSCULAR | Status: AC
  Filled 2020-01-04: qty 50

## 2020-01-08 ENCOUNTER — Encounter: Payer: Self-pay | Admitting: Internal Medicine

## 2020-01-08 ENCOUNTER — Telehealth: Payer: Self-pay | Admitting: Internal Medicine

## 2020-01-08 ENCOUNTER — Other Ambulatory Visit: Payer: Self-pay

## 2020-01-08 ENCOUNTER — Inpatient Hospital Stay: Payer: Medicare Other | Admitting: Internal Medicine

## 2020-01-08 VITALS — BP 154/67 | HR 55 | Temp 97.7°F | Resp 20 | Ht 67.0 in | Wt 175.6 lb

## 2020-01-08 DIAGNOSIS — C3492 Malignant neoplasm of unspecified part of left bronchus or lung: Secondary | ICD-10-CM | POA: Diagnosis not present

## 2020-01-08 DIAGNOSIS — C3432 Malignant neoplasm of lower lobe, left bronchus or lung: Secondary | ICD-10-CM | POA: Diagnosis not present

## 2020-01-08 NOTE — Progress Notes (Signed)
[place] [phone]   Fax:(336) [phone]  OFFICE PROGRESS NOTE  Aura Dials, MD [place] [address]  DIAGNOSIS: Stage IV (T3, N0, M1c) non-small cell lung cancer, adenocarcinoma presented with cavitary left lower lobe lung mass in addition to other pulmonary nodules and scattered hypermetabolic osseous metastatic disease involving the spine, ribs and bony pelvis as well as left proximal humeral pathologic fracture.   Molecular Biomarkers performed by [employer](S) / [name]      [id number]  Afatinib, Dacomitinib, Erlotinib, Gefitinib, Osimertinib, Ramucirumab Neratinib  FGFR1Amplification  Erdafitinib, Lenvatinib, Nintedanib, Pazopanib, Pemigatinib, Ponatinib  [id number] None  PRIOR THERAPY: None  CURRENT THERAPY:  1) Palliative radiotherapy to the the left humerus under the care of Dr. Sondra Come. Last treatment expected on 04/24/2019 2) Targeted therapy with Tagrisso 80 p.o. daily.  He started the first dose on April 21, 2019.  Status post 9 months of treatment.  INTERVAL HISTORY: Albert Perez 69 y.o. male returns to the clinic today for follow-up visit.  The patient is feeling fine today with no concerning complaints.  He enjoyed his Fourth of July weekend.  He did drink a lot of alcohol during the holiday.  He denied having any current chest pain, shortness of breath, cough or hemoptysis.  He denied having any fever or chills.  He has no nausea, vomiting, diarrhea or constipation.  He denied having any headache or visual changes.  He has no recent weight loss or night sweats.  The patient continues to tolerate his treatment with Tagrisso fairly well.  Is here today for evaluation with repeat blood work as well as CT scan of the chest, abdomen pelvis for restaging of his disease.   MEDICAL HISTORY: Past Medical History:  Diagnosis Date  . A-fib [facility])    PMH: 2000-2001  .  Arthritis   . Cancer ([place])    melanoma 2018  . DVT (deep venous thrombosis) (HCC)    Left brachial vein DVT (01/30/19, following fracture)  . ETOH abuse   . GERD (gastroesophageal reflux disease)   . Lung cancer ([place]) dx'd 02/26/2019  . Metastatic cancer to bone ([place]) dx'd 02/26/2019  . Sleep apnea    do not wear CPAP  . Stroke [facility])    " mini"  . Vertigo   . Wears glasses     ALLERGIES:  has No Known Allergies.  MEDICATIONS:  Current Outpatient Medications  Medication Sig Dispense Refill  . loperamide (IMODIUM) 2 MG capsule Take by mouth as needed for diarrhea or loose stools.    Marland Kitchen acetaminophen (TYLENOL) 650 MG CR tablet Take 1,300 mg by mouth every 8 (eight) hours as needed for pain.    Marland Kitchen aspirin 81 MG tablet Take 1 tablet (81 mg total) by mouth daily.    . carvedilol (COREG) 6.25 MG tablet TAKE 1 TABLET BY MOUTH TWICE A DAY 180 tablet 2  . Cyanocobalamin (B-12 PO) Take 1,000 mg by mouth daily.     . meloxicam (MOBIC) 15 MG tablet Take 15 mg by mouth daily.    . Multiple Vitamin (MULTIVITAMIN PO) Take by mouth daily. Centrum    . Omega-3 Fatty Acids (OMEGA-3 PO) Take 1,200 mg by mouth daily.     Marland Kitchen osimertinib mesylate (TAGRISSO) 80 MG tablet Take 1 tablet (80 mg total) by mouth daily. 30 tablet 2  . rosuvastatin (CRESTOR) 5 MG tablet Take 5 mg by mouth daily.  2  No current facility-administered medications for this visit.    SURGICAL HISTORY:  Past Surgical History:  Procedure Laterality Date  . ANKLE FRACTURE SURGERY Right 2003  . COLONOSCOPY  2018  . ELBOW SURGERY Left 1964  . melanoma removal  04/2017   Lt shoulder  . RETINAL DETACHMENT SURGERY Right 1971  . TONSILLECTOMY    . VIDEO BRONCHOSCOPY WITH ENDOBRONCHIAL NAVIGATION N/A 03/28/2019   Procedure: VIDEO BRONCHOSCOPY WITH ENDOBRONCHIAL NAVIGATION and Biopsies;  Surgeon: Collene Gobble, MD;  Location: [facility] OR;  Service: Thoracic;  Laterality: N/A;    REVIEW OF SYSTEMS:  Constitutional: negative Eyes:  negative Ears, nose, mouth, throat, and face: negative Respiratory: negative Cardiovascular: negative Gastrointestinal: negative Genitourinary:negative Integument/breast: negative Hematologic/lymphatic: negative Musculoskeletal:negative Neurological: negative Behavioral/Psych: negative Endocrine: negative Allergic/Immunologic: negative   PHYSICAL EXAMINATION: General appearance: alert, cooperative and no distress Head: Normocephalic, without obvious abnormality, atraumatic Neck: no adenopathy, no JVD, supple, symmetrical, trachea midline and thyroid not enlarged, symmetric, no tenderness/mass/nodules Lymph nodes: Cervical, supraclavicular, and axillary nodes normal. Resp: clear to auscultation bilaterally Back: symmetric, no curvature. ROM normal. No CVA tenderness. Cardio: regular rate and rhythm, S1, S2 normal, no murmur, click, rub or gallop GI: soft, non-tender; bowel sounds normal; no masses,  no organomegaly Extremities: extremities normal, atraumatic, no cyanosis or edema Neurologic: Alert and oriented X 3, normal strength and tone. Normal symmetric reflexes. Normal coordination and gait   ECOG PERFORMANCE STATUS: 1 - Symptomatic but completely ambulatory  Blood pressure (!) 154/67, pulse (!) 55, temperature 97.7 F (36.5 C), temperature source Temporal, resp. rate 20, height _0  (1.702 m), weight 175 lb 9.6 oz (79.7 kg), SpO2 100 %.  LABORATORY DATA: Lab Results  Component Value Date   WBC 7.0 01/04/2020   HGB 12.9 (L) 01/04/2020   HCT 38.8 (L) 01/04/2020   MCV 97.5 01/04/2020   PLT 151 01/04/2020      Chemistry      Component Value Date/Time   NA 140 01/04/2020 1043   K 4.5 01/04/2020 1043   CL 107 01/04/2020 1043   CO2 24 01/04/2020 1043   BUN 16 01/04/2020 1043   CREATININE 1.29 (H) 01/04/2020 1043      Component Value Date/Time   CALCIUM 8.9 01/04/2020 1043   ALKPHOS 84 01/04/2020 1043   AST 20 01/04/2020 1043   ALT 22 01/04/2020 1043   BILITOT 0.6  01/04/2020 1043       RADIOGRAPHIC STUDIES: CT Chest W Contrast  Result Date: 01/04/2020 CLINICAL DATA:  Primary Cancer Type: Lung Imaging Indication: Assess response to therapy Interval therapy since last imaging? Yes Initial Cancer Diagnosis Date: 03/28/2019; Established by: Biopsy-proven Detailed Pathology: Stage IV non-small cell lung cancer, adenocarcinoma. Primary Tumor location: Cavitary left lower lobe lung mass. Osseous metastatic disease involving the spine, ribs and bony pelvis as well as left proximal humeral pathologic fracture. Chemotherapy: Yes; Ongoing? Yes; Most recent administration: Tagrisso daily Immunotherapy? No Radiation therapy? Yes; Date Range: 04/12/2019-04/24/2019; Target: Left humerus Other Cancers: Melanoma 2018. EXAM: CT CHEST, ABDOMEN, AND PELVIS WITH CONTRAST TECHNIQUE: Multidetector CT imaging of the chest, abdomen and pelvis was performed following the standard protocol during bolus administration of intravenous contrast. CONTRAST:  165m OMNIPAQUE IOHEXOL 300 MG/ML  SOLN COMPARISON:  Most recent CT chest, abdomen and pelvis 09/03/2019. 04/04/2019 PET-CT. FINDINGS: CT CHEST FINDINGS Cardiovascular: Normal heart size. Aortic atherosclerosis. Three vessel coronary artery atherosclerotic calcifications identified. Mediastinum/Nodes: Normal appearance of the thyroid gland. The trachea appears patent and is midline. Normal appearance of the esophagus.  No enlarged axillary, supraclavicular, mediastinal, or hilar adenopathy. Lungs/Pleura: No pleural effusion. Treated lesion within superior segment of left lower lobe measures 3.5 by 2.1 cm, image 64/4. Previously 3.7 x 2.2 cm. Subpleural nodule within the medial right lower lobe is unchanged measuring 4 mm, image 112/4. Central right middle lobe lung lesion is best appreciated on the soft tissue windows measuring 1.6 by 0.9 cm, image 32/2. previously 1.6 x 1.2 cm. Stable 5 mm anterior left upper lobe lung nodule. Musculoskeletal:  Stable appearance mixed lytic and sclerotic bone lesions within the thoracic spine. No new suspicious or acute osseous findings. CT ABDOMEN PELVIS FINDINGS Hepatobiliary: No focal liver abnormality is seen. No gallstones, gallbladder wall thickening, or biliary dilatation. Pancreas: Unremarkable. No pancreatic ductal dilatation or surrounding inflammatory changes. Spleen: Normal in size without focal abnormality. Adrenals/Urinary Tract: Normal appearance of the adrenal glands. Right kidney cyst, unchanged. No suspicious kidney lesion or hydronephrosis. Urinary bladder unremarkable. Stomach/Bowel: Stomach is within normal limits. Appendix appears normal. No evidence of bowel wall thickening, distention, or inflammatory changes. Vascular/Lymphatic: Aortic atherosclerosis. No aneurysm. Left periaortic lymph node is stable measuring 1.1 cm, image 67/2. No additional enlarged abdominopelvic lymph nodes identified. Reproductive: Prostate is unremarkable. Other: No free fluid or fluid collections. Musculoskeletal: Improved appearance of previously noted sclerotic bone metastases involving the right iliac bone and left iliac wing. IMPRESSION: 1. Treated lesion within superior segment of left lower lobe is slightly decreased in size in the interval. No new sites of disease. 2. Stable appearance of central right middle lobe lung lesion. 3. Improved appearance of previously noted sclerotic bone metastases within the bony pelvis. 4. Stable borderline enlarged left periaortic lymph node. 5. Three vessel coronary artery atherosclerotic calcifications. 6. Aortic atherosclerosis. Aortic Atherosclerosis (ICD10-I70.0). Electronically Signed   By: Kerby Moors M.D.   On: 01/04/2020 12:37   CT Abdomen Pelvis W Contrast  Result Date: 01/04/2020 CLINICAL DATA:  Primary Cancer Type: Lung Imaging Indication: Assess response to therapy Interval therapy since last imaging? Yes Initial Cancer Diagnosis Date: 03/28/2019; Established by:  Biopsy-proven Detailed Pathology: Stage IV non-small cell lung cancer, adenocarcinoma. Primary Tumor location: Cavitary left lower lobe lung mass. Osseous metastatic disease involving the spine, ribs and bony pelvis as well as left proximal humeral pathologic fracture. Chemotherapy: Yes; Ongoing? Yes; Most recent administration: Tagrisso daily Immunotherapy? No Radiation therapy? Yes; Date Range: 04/12/2019-04/24/2019; Target: Left humerus Other Cancers: Melanoma 2018. EXAM: CT CHEST, ABDOMEN, AND PELVIS WITH CONTRAST TECHNIQUE: Multidetector CT imaging of the chest, abdomen and pelvis was performed following the standard protocol during bolus administration of intravenous contrast. CONTRAST:  176m OMNIPAQUE IOHEXOL 300 MG/ML  SOLN COMPARISON:  Most recent CT chest, abdomen and pelvis 09/03/2019. 04/04/2019 PET-CT. FINDINGS: CT CHEST FINDINGS Cardiovascular: Normal heart size. Aortic atherosclerosis. Three vessel coronary artery atherosclerotic calcifications identified. Mediastinum/Nodes: Normal appearance of the thyroid gland. The trachea appears patent and is midline. Normal appearance of the esophagus. No enlarged axillary, supraclavicular, mediastinal, or hilar adenopathy. Lungs/Pleura: No pleural effusion. Treated lesion within superior segment of left lower lobe measures 3.5 by 2.1 cm, image 64/4. Previously 3.7 x 2.2 cm. Subpleural nodule within the medial right lower lobe is unchanged measuring 4 mm, image 112/4. Central right middle lobe lung lesion is best appreciated on the soft tissue windows measuring 1.6 by 0.9 cm, image 32/2. previously 1.6 x 1.2 cm. Stable 5 mm anterior left upper lobe lung nodule. Musculoskeletal: Stable appearance mixed lytic and sclerotic bone lesions within the thoracic spine. No new suspicious or  acute osseous findings. CT ABDOMEN PELVIS FINDINGS Hepatobiliary: No focal liver abnormality is seen. No gallstones, gallbladder wall thickening, or biliary dilatation. Pancreas:  Unremarkable. No pancreatic ductal dilatation or surrounding inflammatory changes. Spleen: Normal in size without focal abnormality. Adrenals/Urinary Tract: Normal appearance of the adrenal glands. Right kidney cyst, unchanged. No suspicious kidney lesion or hydronephrosis. Urinary bladder unremarkable. Stomach/Bowel: Stomach is within normal limits. Appendix appears normal. No evidence of bowel wall thickening, distention, or inflammatory changes. Vascular/Lymphatic: Aortic atherosclerosis. No aneurysm. Left periaortic lymph node is stable measuring 1.1 cm, image 67/2. No additional enlarged abdominopelvic lymph nodes identified. Reproductive: Prostate is unremarkable. Other: No free fluid or fluid collections. Musculoskeletal: Improved appearance of previously noted sclerotic bone metastases involving the right iliac bone and left iliac wing. IMPRESSION: 1. Treated lesion within superior segment of left lower lobe is slightly decreased in size in the interval. No new sites of disease. 2. Stable appearance of central right middle lobe lung lesion. 3. Improved appearance of previously noted sclerotic bone metastases within the bony pelvis. 4. Stable borderline enlarged left periaortic lymph node. 5. Three vessel coronary artery atherosclerotic calcifications. 6. Aortic atherosclerosis. Aortic Atherosclerosis (ICD10-I70.0). Electronically Signed   By: Kerby Moors M.D.   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  He is status post palliative radiotherapy to the metastatic disease in the left  arm. The patient is currently undergoing treatment with target therapy with Tagrisso 80 mg p.o. daily started 9 months ago. The patient continues to tolerate this treatment well with no concerning adverse effects. He had repeat CT scan of the chest, abdomen pelvis performed recently.  I personally and independently reviewed the scan and discussed the results with the patient today. His scan showed no concerning findings for disease progression.  There was further improvement and the superior segment left lower lobe lesion. I recommended for the patient to continue his current treatment with Tagrisso with the same dose. I will see him back for follow-up visit in 2 months for evaluation with repeat blood work. He was advised to call immediately if he has any concerning symptoms in the interval. The patient voices understanding of current disease status and treatment options and is in agreement with the current care plan. All questions were answered. The patient knows to call the clinic with any problems, questions or concerns. We can certainly see the patient much sooner if necessary.   Disclaimer: This note was dictated with voice recognition software. Similar sounding words can inadvertently be transcribed and may not be corrected upon review.

## 2020-01-08 NOTE — Telephone Encounter (Signed)
Scheduled per 07/06 los, patient will be notified per My chart.

## 2020-01-15 ENCOUNTER — Encounter: Payer: Self-pay | Admitting: Internal Medicine

## 2020-01-16 ENCOUNTER — Other Ambulatory Visit: Payer: Self-pay | Admitting: Medical Oncology

## 2020-01-16 DIAGNOSIS — C3492 Malignant neoplasm of unspecified part of left bronchus or lung: Secondary | ICD-10-CM

## 2020-01-16 MED ORDER — OSIMERTINIB MESYLATE 80 MG PO TABS: 80.0000 mg | ORAL_TABLET | Freq: Every day | ORAL | 2 refills | Status: DC

## 2020-01-16 NOTE — Telephone Encounter (Signed)
Tagrisso refill.

## 2020-01-22 ENCOUNTER — Other Ambulatory Visit: Payer: Self-pay | Admitting: Medical Oncology

## 2020-01-22 NOTE — Telephone Encounter (Signed)
Tagrisso refill requested.  Refill sent 01/16/20.

## 2020-01-28 ENCOUNTER — Telehealth: Payer: Self-pay | Admitting: *Deleted

## 2020-01-28 NOTE — Telephone Encounter (Signed)
Patients wife called about refill status.  Per system it showed refill was processed on 01/16/2020.  THey will attempt to reach pharmacy to check on refill status.

## 2020-02-01 ENCOUNTER — Telehealth: Payer: Self-pay | Admitting: *Deleted

## 2020-02-01 NOTE — Telephone Encounter (Signed)
lmtcb  (discuss CPAP compliance.  ?? Need for sleep study)

## 2020-03-17 ENCOUNTER — Encounter: Payer: Self-pay | Admitting: Internal Medicine

## 2020-03-17 ENCOUNTER — Other Ambulatory Visit: Payer: Self-pay

## 2020-03-17 ENCOUNTER — Inpatient Hospital Stay: Payer: Medicare Other | Attending: Internal Medicine

## 2020-03-17 ENCOUNTER — Inpatient Hospital Stay: Payer: Medicare Other | Admitting: Internal Medicine

## 2020-03-17 VITALS — BP 157/65 | HR 55 | Temp 97.8°F | Resp 18 | Ht 67.0 in | Wt 172.6 lb

## 2020-03-17 DIAGNOSIS — G4730 Sleep apnea, unspecified: Secondary | ICD-10-CM | POA: Insufficient documentation

## 2020-03-17 DIAGNOSIS — C3492 Malignant neoplasm of unspecified part of left bronchus or lung: Secondary | ICD-10-CM

## 2020-03-17 DIAGNOSIS — Z5111 Encounter for antineoplastic chemotherapy: Secondary | ICD-10-CM

## 2020-03-17 DIAGNOSIS — Z79899 Other long term (current) drug therapy: Secondary | ICD-10-CM | POA: Insufficient documentation

## 2020-03-17 DIAGNOSIS — C3432 Malignant neoplasm of lower lobe, left bronchus or lung: Secondary | ICD-10-CM | POA: Diagnosis present

## 2020-03-17 DIAGNOSIS — I4891 Unspecified atrial fibrillation: Secondary | ICD-10-CM | POA: Insufficient documentation

## 2020-03-17 DIAGNOSIS — Z85820 Personal history of malignant melanoma of skin: Secondary | ICD-10-CM | POA: Diagnosis not present

## 2020-03-17 DIAGNOSIS — Z8673 Personal history of transient ischemic attack (TIA), and cerebral infarction without residual deficits: Secondary | ICD-10-CM | POA: Insufficient documentation

## 2020-03-17 DIAGNOSIS — I10 Essential (primary) hypertension: Secondary | ICD-10-CM | POA: Diagnosis not present

## 2020-03-17 DIAGNOSIS — Z86718 Personal history of other venous thrombosis and embolism: Secondary | ICD-10-CM | POA: Diagnosis not present

## 2020-03-17 DIAGNOSIS — K219 Gastro-esophageal reflux disease without esophagitis: Secondary | ICD-10-CM | POA: Insufficient documentation

## 2020-03-17 DIAGNOSIS — C7951 Secondary malignant neoplasm of bone: Secondary | ICD-10-CM | POA: Diagnosis present

## 2020-03-17 DIAGNOSIS — C3490 Malignant neoplasm of unspecified part of unspecified bronchus or lung: Secondary | ICD-10-CM | POA: Diagnosis not present

## 2020-03-17 DIAGNOSIS — Z7982 Long term (current) use of aspirin: Secondary | ICD-10-CM | POA: Diagnosis not present

## 2020-03-17 DIAGNOSIS — Z791 Long term (current) use of non-steroidal anti-inflammatories (NSAID): Secondary | ICD-10-CM | POA: Diagnosis not present

## 2020-03-17 LAB — CBC WITH DIFFERENTIAL (CANCER CENTER ONLY)
Abs Immature Granulocytes: 0.03 10*3/uL (ref 0.00–0.07)
Basophils Absolute: 0 10*3/uL (ref 0.0–0.1)
Basophils Relative: 0 %
Eosinophils Absolute: 0.1 10*3/uL (ref 0.0–0.5)
Eosinophils Relative: 1 %
HCT: 42.2 % (ref 39.0–52.0)
Hemoglobin: 14 g/dL (ref 13.0–17.0)
Immature Granulocytes: 0 %
Lymphocytes Relative: 21 %
Lymphs Abs: 1.5 10*3/uL (ref 0.7–4.0)
MCH: 33.2 pg (ref 26.0–34.0)
MCHC: 33.2 g/dL (ref 30.0–36.0)
MCV: 100 fL (ref 80.0–100.0)
Monocytes Absolute: 0.6 10*3/uL (ref 0.1–1.0)
Monocytes Relative: 9 %
Neutro Abs: 4.7 10*3/uL (ref 1.7–7.7)
Neutrophils Relative %: 69 %
Platelet Count: 147 10*3/uL — ABNORMAL LOW (ref 150–400)
RBC: 4.22 MIL/uL (ref 4.22–5.81)
RDW: 13.6 % (ref 11.5–15.5)
WBC Count: 6.9 10*3/uL (ref 4.0–10.5)
nRBC: 0 % (ref 0.0–0.2)

## 2020-03-17 LAB — CMP (CANCER CENTER ONLY)
ALT: 30 U/L (ref 0–44)
AST: 32 U/L (ref 15–41)
Albumin: 3.8 g/dL (ref 3.5–5.0)
Alkaline Phosphatase: 87 U/L (ref 38–126)
Anion gap: 7 (ref 5–15)
BUN: 23 mg/dL (ref 8–23)
CO2: 25 mmol/L (ref 22–32)
Calcium: 9.2 mg/dL (ref 8.9–10.3)
Chloride: 109 mmol/L (ref 98–111)
Creatinine: 1.49 mg/dL — ABNORMAL HIGH (ref 0.61–1.24)
GFR, Est AFR Am: 54 mL/min — ABNORMAL LOW (ref >60)
GFR, Estimated: 47 mL/min — ABNORMAL LOW (ref >60)
Glucose, Bld: 150 mg/dL — ABNORMAL HIGH (ref 70–99)
Potassium: 5.1 mmol/L (ref 3.5–5.1)
Sodium: 141 mmol/L (ref 135–145)
Total Bilirubin: 0.8 mg/dL (ref 0.3–1.2)
Total Protein: 6.9 g/dL (ref 6.5–8.1)

## 2020-03-17 NOTE — Progress Notes (Signed)
[place] [phone]) [phone]   Fax:(336) [phone]  OFFICE PROGRESS NOTE  Aura Dials, MD [place] [address]  DIAGNOSIS: Stage IV (T3, N0, M1c) non-small cell lung cancer, adenocarcinoma presented with cavitary left lower lobe lung mass in addition to other pulmonary nodules and scattered hypermetabolic osseous metastatic disease involving the spine, ribs and bony pelvis as well as left proximal humeral pathologic fracture.   Molecular Biomarkers performed by [employer](S) / [name]      [id number]  Afatinib, Dacomitinib, Erlotinib, Gefitinib, Osimertinib, Ramucirumab Neratinib  FGFR1Amplification  Erdafitinib, Lenvatinib, Nintedanib, Pazopanib, Pemigatinib, Ponatinib  [id number] None  PRIOR THERAPY: None  CURRENT THERAPY:  1) Palliative radiotherapy to the the left humerus under the care of Dr. Sondra Come. Last treatment expected on 04/24/2019 2) Targeted therapy with Tagrisso 80 p.o. daily.  He started the first dose on April 21, 2019.  Status post 11 months of treatment.  INTERVAL HISTORY: Albert Perez 69 y.o. male returns to the clinic today for follow-up visit.  The patient is feeling fine today with no concerning complaints except for occasional left arm pain.  He denied having any chest pain, shortness of breath, cough or hemoptysis.  He denied having any nausea, vomiting, constipation but has occasional diarrhea improved with Imodium.  He has no headache or visual changes.  He lost few pounds since his last visit.  The patient spent a nice vacation in [place] with his friends.  He is here today for evaluation and repeat blood work.   MEDICAL HISTORY: Past Medical History:  Diagnosis Date  . A-fib [facility])    PMH: 2000-2001  . Arthritis   . Cancer ([place])    melanoma 2018  . DVT (deep venous thrombosis) (HCC)    Left brachial vein DVT (01/30/19, following fracture)  . ETOH abuse   .  GERD (gastroesophageal reflux disease)   . Lung cancer ([place]) dx'd 02/26/2019  . Metastatic cancer to bone ([place]) dx'd 02/26/2019  . Sleep apnea    do not wear CPAP  . Stroke [facility])    " mini"  . Vertigo   . Wears glasses     ALLERGIES:  has No Known Allergies.  MEDICATIONS:  Current Outpatient Medications  Medication Sig Dispense Refill  . acetaminophen (TYLENOL) 650 MG CR tablet Take 1,300 mg by mouth every 8 (eight) hours as needed for pain.    Marland Kitchen aspirin 81 MG tablet Take 1 tablet (81 mg total) by mouth daily.    . carvedilol (COREG) 6.25 MG tablet TAKE 1 TABLET BY MOUTH TWICE A DAY 180 tablet 2  . Cyanocobalamin (B-12 PO) Take 1,000 mg by mouth daily.     Marland Kitchen loperamide (IMODIUM) 2 MG capsule Take by mouth as needed for diarrhea or loose stools.    . meloxicam (MOBIC) 15 MG tablet Take 15 mg by mouth daily.    . Multiple Vitamin (MULTIVITAMIN PO) Take by mouth daily. Centrum    . Omega-3 Fatty Acids (OMEGA-3 PO) Take 1,200 mg by mouth daily.     Marland Kitchen osimertinib mesylate (TAGRISSO) 80 MG tablet Take 1 tablet (80 mg total) by mouth daily. 30 tablet 2  . rosuvastatin (CRESTOR) 5 MG tablet Take 5 mg by mouth daily.  2   No current facility-administered medications for this visit.    SURGICAL HISTORY:  Past Surgical History:  Procedure Laterality Date  . ANKLE FRACTURE SURGERY Right 2003  . COLONOSCOPY  2018  . ELBOW SURGERY Left 1964  . melanoma removal  04/2017   Lt shoulder  . RETINAL DETACHMENT SURGERY Right 1971  . TONSILLECTOMY    . VIDEO BRONCHOSCOPY WITH ENDOBRONCHIAL NAVIGATION N/A 03/28/2019   Procedure: VIDEO BRONCHOSCOPY WITH ENDOBRONCHIAL NAVIGATION and Biopsies;  Surgeon: Collene Gobble, MD;  Location: [facility] OR;  Service: Thoracic;  Laterality: N/A;    REVIEW OF SYSTEMS:  A comprehensive review of systems was negative except for: Gastrointestinal: positive for diarrhea   PHYSICAL EXAMINATION: General appearance: alert, cooperative and no distress Head: Normocephalic,  without obvious abnormality, atraumatic Neck: no adenopathy, no JVD, supple, symmetrical, trachea midline and thyroid not enlarged, symmetric, no tenderness/mass/nodules Lymph nodes: Cervical, supraclavicular, and axillary nodes normal. Resp: clear to auscultation bilaterally Back: symmetric, no curvature. ROM normal. No CVA tenderness. Cardio: regular rate and rhythm, S1, S2 normal, no murmur, click, rub or gallop GI: soft, non-tender; bowel sounds normal; no masses,  no organomegaly Extremities: extremities normal, atraumatic, no cyanosis or edema   ECOG PERFORMANCE STATUS: 1 - Symptomatic but completely ambulatory  Blood pressure (!) 157/65, pulse (!) 55, temperature 97.8 F (36.6 C), temperature source Tympanic, resp. rate 18, height $RemoveBe'5\' 7"'[id number]$  (1.702 m), weight 172 lb 9.6 oz (78.3 kg), SpO2 100 %.  LABORATORY DATA: Lab Results  Component Value Date   WBC 6.9 03/17/2020   HGB 14.0 03/17/2020   HCT 42.2 03/17/2020   MCV 100.0 03/17/2020   PLT 147 (L) 03/17/2020      Chemistry      Component Value Date/Time   NA 140 01/04/2020 1043   K 4.5 01/04/2020 1043   CL 107 01/04/2020 1043   CO2 24 01/04/2020 1043   BUN 16 01/04/2020 1043   CREATININE 1.29 (H) 01/04/2020 1043      Component Value Date/Time   CALCIUM 8.9 01/04/2020 1043   ALKPHOS 84 01/04/2020 1043   AST 20 01/04/2020 1043   ALT 22 01/04/2020 1043   BILITOT 0.6 01/04/2020 1043       RADIOGRAPHIC STUDIES: No results found.  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  He is status post palliative radiotherapy to the  metastatic disease in the left arm. The patient is currently undergoing treatment with target therapy with Tagrisso 80 mg p.o. daily started 11 months ago. The patient continues to tolerate his treatment well with no concerning adverse effects. I recommended for him to proceed with his current treatment with Tagrisso 80 mg p.o. daily as planned. I will see him back for follow-up visit in 1 months for evaluation with repeat CT scan of the chest, abdomen pelvis for restaging of his disease. The patient was advised to call immediately if he has any concerning symptoms in the interval. The patient voices understanding of current disease status and treatment options and is in agreement with the current care plan. All questions were answered. The patient knows to call the clinic with any problems, questions or concerns. We can certainly see the patient much sooner if necessary.   Disclaimer: This note was dictated with voice recognition software. Similar sounding words can inadvertently be transcribed and may not be  corrected upon review.

## 2020-03-19 ENCOUNTER — Telehealth: Payer: Self-pay | Admitting: Internal Medicine

## 2020-03-19 NOTE — Telephone Encounter (Signed)
Scheduled per los. Called and spoke with patient. Confirmed appt 

## 2020-04-08 ENCOUNTER — Telehealth: Payer: Self-pay

## 2020-04-08 NOTE — Telephone Encounter (Signed)
Nutrition  Patient identified on Malnutrition screening report for weight loss.  Reached out to patient via phone (mobile number).  No answer.  Left message with call back number.   Keyshaun Exley B. Zenia Resides, [place], [place] Registered Dietitian [phone] (mobile)

## 2020-04-14 ENCOUNTER — Inpatient Hospital Stay: Payer: Medicare Other

## 2020-04-15 ENCOUNTER — Other Ambulatory Visit: Payer: Self-pay

## 2020-04-15 ENCOUNTER — Ambulatory Visit (HOSPITAL_COMMUNITY)
Admission: RE | Admit: 2020-04-15 | Discharge: 2020-04-15 | Disposition: A | Discharge status: Home / Self Care | Payer: Medicare Other | Source: Ambulatory Visit | Attending: Internal Medicine | Admitting: Internal Medicine

## 2020-04-15 ENCOUNTER — Inpatient Hospital Stay: Payer: Medicare Other | Attending: Internal Medicine

## 2020-04-15 DIAGNOSIS — C7951 Secondary malignant neoplasm of bone: Secondary | ICD-10-CM | POA: Diagnosis present

## 2020-04-15 DIAGNOSIS — Z85820 Personal history of malignant melanoma of skin: Secondary | ICD-10-CM | POA: Diagnosis not present

## 2020-04-15 DIAGNOSIS — C3490 Malignant neoplasm of unspecified part of unspecified bronchus or lung: Secondary | ICD-10-CM

## 2020-04-15 DIAGNOSIS — I4891 Unspecified atrial fibrillation: Secondary | ICD-10-CM | POA: Diagnosis not present

## 2020-04-15 DIAGNOSIS — Z791 Long term (current) use of non-steroidal anti-inflammatories (NSAID): Secondary | ICD-10-CM | POA: Insufficient documentation

## 2020-04-15 DIAGNOSIS — K219 Gastro-esophageal reflux disease without esophagitis: Secondary | ICD-10-CM | POA: Diagnosis not present

## 2020-04-15 DIAGNOSIS — C3432 Malignant neoplasm of lower lobe, left bronchus or lung: Secondary | ICD-10-CM | POA: Insufficient documentation

## 2020-04-15 DIAGNOSIS — G4730 Sleep apnea, unspecified: Secondary | ICD-10-CM | POA: Insufficient documentation

## 2020-04-15 DIAGNOSIS — Z8673 Personal history of transient ischemic attack (TIA), and cerebral infarction without residual deficits: Secondary | ICD-10-CM | POA: Diagnosis not present

## 2020-04-15 DIAGNOSIS — Z86718 Personal history of other venous thrombosis and embolism: Secondary | ICD-10-CM | POA: Insufficient documentation

## 2020-04-15 DIAGNOSIS — Z7982 Long term (current) use of aspirin: Secondary | ICD-10-CM | POA: Diagnosis not present

## 2020-04-15 DIAGNOSIS — Z79899 Other long term (current) drug therapy: Secondary | ICD-10-CM | POA: Diagnosis not present

## 2020-04-15 LAB — CBC WITH DIFFERENTIAL (CANCER CENTER ONLY)
Abs Immature Granulocytes: 0.02 10*3/uL (ref 0.00–0.07)
Basophils Absolute: 0 10*3/uL (ref 0.0–0.1)
Basophils Relative: 1 %
Eosinophils Absolute: 0.1 10*3/uL (ref 0.0–0.5)
Eosinophils Relative: 2 %
HCT: 39.7 % (ref 39.0–52.0)
Hemoglobin: 13.3 g/dL (ref 13.0–17.0)
Immature Granulocytes: 0 %
Lymphocytes Relative: 27 %
Lymphs Abs: 1.6 10*3/uL (ref 0.7–4.0)
MCH: 32.7 pg (ref 26.0–34.0)
MCHC: 33.5 g/dL (ref 30.0–36.0)
MCV: 97.5 fL (ref 80.0–100.0)
Monocytes Absolute: 0.6 10*3/uL (ref 0.1–1.0)
Monocytes Relative: 10 %
Neutro Abs: 3.7 10*3/uL (ref 1.7–7.7)
Neutrophils Relative %: 60 %
Platelet Count: 159 10*3/uL (ref 150–400)
RBC: 4.07 MIL/uL — ABNORMAL LOW (ref 4.22–5.81)
RDW: 13.2 % (ref 11.5–15.5)
WBC Count: 6.1 10*3/uL (ref 4.0–10.5)
nRBC: 0 % (ref 0.0–0.2)

## 2020-04-15 LAB — CMP (CANCER CENTER ONLY)
ALT: 27 U/L (ref 0–44)
AST: 24 U/L (ref 15–41)
Albumin: 3.8 g/dL (ref 3.5–5.0)
Alkaline Phosphatase: 95 U/L (ref 38–126)
Anion gap: 3 — ABNORMAL LOW (ref 5–15)
BUN: 17 mg/dL (ref 8–23)
CO2: 32 mmol/L (ref 22–32)
Calcium: 9.6 mg/dL (ref 8.9–10.3)
Chloride: 106 mmol/L (ref 98–111)
Creatinine: 1.24 mg/dL (ref 0.61–1.24)
GFR, Estimated: 59 mL/min — ABNORMAL LOW (ref >60)
Glucose, Bld: 97 mg/dL (ref 70–99)
Potassium: 4.6 mmol/L (ref 3.5–5.1)
Sodium: 141 mmol/L (ref 135–145)
Total Bilirubin: 0.5 mg/dL (ref 0.3–1.2)
Total Protein: 6.8 g/dL (ref 6.5–8.1)

## 2020-04-15 MED ORDER — IOHEXOL 300 MG/ML  SOLN
100.0000 mL | Freq: Once | INTRAMUSCULAR | Status: AC | PRN
  Administered 2020-04-15: 100 mL via INTRAVENOUS

## 2020-04-15 MED ORDER — IOHEXOL 350 MG/ML SOLN: 100.0000 mL | Freq: Once | INTRAVENOUS | Status: DC | PRN

## 2020-04-16 ENCOUNTER — Inpatient Hospital Stay: Payer: Medicare Other | Admitting: Internal Medicine

## 2020-04-16 ENCOUNTER — Encounter: Payer: Self-pay | Admitting: Internal Medicine

## 2020-04-16 ENCOUNTER — Other Ambulatory Visit: Payer: Self-pay

## 2020-04-16 ENCOUNTER — Telehealth: Payer: Self-pay | Admitting: Internal Medicine

## 2020-04-16 VITALS — BP 125/54 | HR 54 | Temp 97.8°F | Resp 18 | Ht 67.0 in | Wt 173.3 lb

## 2020-04-16 DIAGNOSIS — Z5111 Encounter for antineoplastic chemotherapy: Secondary | ICD-10-CM

## 2020-04-16 DIAGNOSIS — C3492 Malignant neoplasm of unspecified part of left bronchus or lung: Secondary | ICD-10-CM

## 2020-04-16 DIAGNOSIS — I10 Essential (primary) hypertension: Secondary | ICD-10-CM

## 2020-04-16 DIAGNOSIS — C3432 Malignant neoplasm of lower lobe, left bronchus or lung: Secondary | ICD-10-CM | POA: Diagnosis not present

## 2020-04-16 NOTE — Progress Notes (Signed)
[place] [phone]   Fax:(336) [phone]  OFFICE PROGRESS NOTE  Aura Dials, MD [place] [address]  DIAGNOSIS: Stage IV (T3, N0, M1c) non-small cell lung cancer, adenocarcinoma presented with cavitary left lower lobe lung mass in addition to other pulmonary nodules and scattered hypermetabolic osseous metastatic disease involving the spine, ribs and bony pelvis as well as left proximal humeral pathologic fracture.   Molecular Biomarkers performed by [employer](S) / [name]      [id number]  Afatinib, Dacomitinib, Erlotinib, Gefitinib, Osimertinib, Ramucirumab Neratinib  FGFR1Amplification  Erdafitinib, Lenvatinib, Nintedanib, Pazopanib, Pemigatinib, Ponatinib  [id number] None  PRIOR THERAPY: None  CURRENT THERAPY:  1) Palliative radiotherapy to the the left humerus under the care of Dr. Sondra Come. Last treatment expected on 04/24/2019 2) Targeted therapy with Tagrisso 80 p.o. daily.  He started the first dose on April 21, 2019.  Status post 12 months of treatment.  INTERVAL HISTORY: Albert Perez 69 y.o. male returns to the clinic today for follow-up visit.  The patient is feeling fine today with no concerning complaints.  He denied having any chest pain, shortness of breath, cough or hemoptysis.  He denied having any fever or chills.  He has no nausea, vomiting, diarrhea or constipation.  He has no headache or visual changes.  The patient denied having any significant weight loss or night sweats.  He has been tolerating his treatment with Tagrisso fairly well.  The patient is here today for evaluation with repeat CT scan of the chest, abdomen pelvis for restaging of his disease.   MEDICAL HISTORY: Past Medical History:  Diagnosis Date  . A-fib [facility])    PMH: 2000-2001  . Arthritis   . Cancer ([place])    melanoma 2018  . DVT (deep venous thrombosis) (HCC)    Left brachial vein  DVT (01/30/19, following fracture)  . ETOH abuse   . GERD (gastroesophageal reflux disease)   . Lung cancer ([place]) dx'd 02/26/2019  . Metastatic cancer to bone ([place]) dx'd 02/26/2019  . Sleep apnea    do not wear CPAP  . Stroke [facility])    " mini"  . Vertigo   . Wears glasses     ALLERGIES:  has No Known Allergies.  MEDICATIONS:  Current Outpatient Medications  Medication Sig Dispense Refill  . acetaminophen (TYLENOL) 650 MG CR tablet Take 1,300 mg by mouth every 8 (eight) hours as needed for pain.    Marland Kitchen aspirin 81 MG tablet Take 1 tablet (81 mg total) by mouth daily.    . carvedilol (COREG) 6.25 MG tablet TAKE 1 TABLET BY MOUTH TWICE A DAY 180 tablet 2  . Cyanocobalamin (B-12 PO) Take 1,000 mg by mouth daily.     Marland Kitchen loperamide (IMODIUM) 2 MG capsule Take by mouth as needed for diarrhea or loose stools.    . meloxicam (MOBIC) 15 MG tablet Take 15 mg by mouth daily.    . Multiple Vitamin (MULTIVITAMIN PO) Take by mouth daily. Centrum    . Omega-3 Fatty Acids (OMEGA-3 PO) Take 1,200 mg by mouth daily.     Marland Kitchen osimertinib mesylate (TAGRISSO) 80 MG tablet Take 1 tablet (80 mg total) by mouth daily. 30 tablet 2  . rosuvastatin (CRESTOR) 5 MG tablet Take 5 mg by mouth daily.  2   No current facility-administered medications for this visit.    SURGICAL HISTORY:  Past Surgical History:  Procedure Laterality Date  .  ANKLE FRACTURE SURGERY Right 2003  . COLONOSCOPY  2018  . ELBOW SURGERY Left 1964  . melanoma removal  04/2017   Lt shoulder  . RETINAL DETACHMENT SURGERY Right 1971  . TONSILLECTOMY    . VIDEO BRONCHOSCOPY WITH ENDOBRONCHIAL NAVIGATION N/A 03/28/2019   Procedure: VIDEO BRONCHOSCOPY WITH ENDOBRONCHIAL NAVIGATION and Biopsies;  Surgeon: Collene Gobble, MD;  Location: [facility] OR;  Service: Thoracic;  Laterality: N/A;    REVIEW OF SYSTEMS:  Constitutional: negative Eyes: negative Ears, nose, mouth, throat, and face: negative Respiratory: negative Cardiovascular:  negative Gastrointestinal: negative Genitourinary:negative Integument/breast: negative Hematologic/lymphatic: negative Musculoskeletal:negative Neurological: negative Behavioral/Psych: negative Endocrine: negative Allergic/Immunologic: negative   PHYSICAL EXAMINATION: General appearance: alert, cooperative and no distress Head: Normocephalic, without obvious abnormality, atraumatic Neck: no adenopathy, no JVD, supple, symmetrical, trachea midline and thyroid not enlarged, symmetric, no tenderness/mass/nodules Lymph nodes: Cervical, supraclavicular, and axillary nodes normal. Resp: clear to auscultation bilaterally Back: symmetric, no curvature. ROM normal. No CVA tenderness. Cardio: regular rate and rhythm, S1, S2 normal, no murmur, click, rub or gallop GI: soft, non-tender; bowel sounds normal; no masses,  no organomegaly Extremities: extremities normal, atraumatic, no cyanosis or edema Neurologic: Alert and oriented X 3, normal strength and tone. Normal symmetric reflexes. Normal coordination and gait   ECOG PERFORMANCE STATUS: 1 - Symptomatic but completely ambulatory  Blood pressure (!) 125/54, pulse (!) 54, temperature 97.8 F (36.6 C), temperature source Tympanic, resp. rate 18, height _0  (1.702 m), weight 173 lb 4.8 oz (78.6 kg), SpO2 100 %.  LABORATORY DATA: Lab Results  Component Value Date   WBC 6.1 04/15/2020   HGB 13.3 04/15/2020   HCT 39.7 04/15/2020   MCV 97.5 04/15/2020   PLT 159 04/15/2020      Chemistry      Component Value Date/Time   NA 141 04/15/2020 1352   K 4.6 04/15/2020 1352   CL 106 04/15/2020 1352   CO2 32 04/15/2020 1352   BUN 17 04/15/2020 1352   CREATININE 1.24 04/15/2020 1352      Component Value Date/Time   CALCIUM 9.6 04/15/2020 1352   ALKPHOS 95 04/15/2020 1352   AST 24 04/15/2020 1352   ALT 27 04/15/2020 1352   BILITOT 0.5 04/15/2020 1352       RADIOGRAPHIC STUDIES: No results found.  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  He is status post palliative radiotherapy to the metastatic disease in the left arm. The patient is currently undergoing treatment with target therapy with Tagrisso 80 mg p.o. daily started 12 months ago. The patient has been tolerating this treatment fairly well with no concerning adverse effects. He had repeat CT scan of the chest, abdomen pelvis performed recently.  I personally and independently reviewed the scan images and discussed it with the patient.  I personally did not see concerning findings for progression but the final report is still pending. I recommended for the patient to continue his current treatment with Tagrisso with the same dose. I will see him back for follow-up visit in 6 weeks with repeat blood work.  If the final report showed any concerning findings for disease progression,  I will call the patient with further recommendation. He was advised to call immediately if he has any concerning symptoms in the interval. The patient voices understanding of current disease status and treatment options and is in agreement with the current care plan. All questions were answered. The patient knows to call the clinic with any problems, questions or concerns. We can certainly see the patient much sooner if necessary.   Disclaimer: This note was dictated with voice recognition software. Similar sounding words can inadvertently be transcribed and may not be corrected upon review.

## 2020-04-16 NOTE — Telephone Encounter (Signed)
Scheduled per los. Declined printout  

## 2020-04-23 ENCOUNTER — Other Ambulatory Visit: Payer: Self-pay | Admitting: Medical Oncology

## 2020-04-23 DIAGNOSIS — C3492 Malignant neoplasm of unspecified part of left bronchus or lung: Secondary | ICD-10-CM

## 2020-04-23 MED ORDER — OSIMERTINIB MESYLATE 80 MG PO TABS: 80.0000 mg | ORAL_TABLET | Freq: Every day | ORAL | 2 refills | Status: DC

## 2020-04-23 NOTE — Telephone Encounter (Signed)
Tagrisso refill requested- 

## 2020-05-27 ENCOUNTER — Other Ambulatory Visit: Payer: Self-pay

## 2020-05-27 ENCOUNTER — Telehealth: Payer: Self-pay

## 2020-05-27 ENCOUNTER — Encounter: Payer: Self-pay | Admitting: Internal Medicine

## 2020-05-27 ENCOUNTER — Inpatient Hospital Stay: Payer: Medicare Other

## 2020-05-27 ENCOUNTER — Inpatient Hospital Stay: Payer: Medicare Other | Attending: Internal Medicine | Admitting: Internal Medicine

## 2020-05-27 VITALS — BP 152/58 | HR 48 | Temp 97.3°F | Resp 16 | Ht 67.0 in | Wt 176.3 lb

## 2020-05-27 DIAGNOSIS — G4730 Sleep apnea, unspecified: Secondary | ICD-10-CM | POA: Insufficient documentation

## 2020-05-27 DIAGNOSIS — Z85820 Personal history of malignant melanoma of skin: Secondary | ICD-10-CM | POA: Diagnosis not present

## 2020-05-27 DIAGNOSIS — I10 Essential (primary) hypertension: Secondary | ICD-10-CM

## 2020-05-27 DIAGNOSIS — I4891 Unspecified atrial fibrillation: Secondary | ICD-10-CM | POA: Insufficient documentation

## 2020-05-27 DIAGNOSIS — Z8673 Personal history of transient ischemic attack (TIA), and cerebral infarction without residual deficits: Secondary | ICD-10-CM | POA: Diagnosis not present

## 2020-05-27 DIAGNOSIS — Z5111 Encounter for antineoplastic chemotherapy: Secondary | ICD-10-CM | POA: Diagnosis not present

## 2020-05-27 DIAGNOSIS — C3432 Malignant neoplasm of lower lobe, left bronchus or lung: Secondary | ICD-10-CM | POA: Insufficient documentation

## 2020-05-27 DIAGNOSIS — C7951 Secondary malignant neoplasm of bone: Secondary | ICD-10-CM | POA: Diagnosis present

## 2020-05-27 DIAGNOSIS — Z86718 Personal history of other venous thrombosis and embolism: Secondary | ICD-10-CM | POA: Diagnosis not present

## 2020-05-27 DIAGNOSIS — C3492 Malignant neoplasm of unspecified part of left bronchus or lung: Secondary | ICD-10-CM

## 2020-05-27 DIAGNOSIS — Z7982 Long term (current) use of aspirin: Secondary | ICD-10-CM | POA: Insufficient documentation

## 2020-05-27 DIAGNOSIS — Z79899 Other long term (current) drug therapy: Secondary | ICD-10-CM | POA: Diagnosis not present

## 2020-05-27 DIAGNOSIS — K219 Gastro-esophageal reflux disease without esophagitis: Secondary | ICD-10-CM | POA: Diagnosis not present

## 2020-05-27 LAB — CMP (CANCER CENTER ONLY)
ALT: 23 U/L (ref 0–44)
AST: 22 U/L (ref 15–41)
Albumin: 3.6 g/dL (ref 3.5–5.0)
Alkaline Phosphatase: 83 U/L (ref 38–126)
Anion gap: 6 (ref 5–15)
BUN: 19 mg/dL (ref 8–23)
CO2: 25 mmol/L (ref 22–32)
Calcium: 9 mg/dL (ref 8.9–10.3)
Chloride: 109 mmol/L (ref 98–111)
Creatinine: 1.25 mg/dL — ABNORMAL HIGH (ref 0.61–1.24)
GFR, Estimated: >60 mL/min (ref >60)
Glucose, Bld: 117 mg/dL — ABNORMAL HIGH (ref 70–99)
Potassium: 4.6 mmol/L (ref 3.5–5.1)
Sodium: 140 mmol/L (ref 135–145)
Total Bilirubin: 0.6 mg/dL (ref 0.3–1.2)
Total Protein: 6.5 g/dL (ref 6.5–8.1)

## 2020-05-27 LAB — CBC WITH DIFFERENTIAL (CANCER CENTER ONLY)
Abs Immature Granulocytes: 0.02 10*3/uL (ref 0.00–0.07)
Basophils Absolute: 0 10*3/uL (ref 0.0–0.1)
Basophils Relative: 1 %
Eosinophils Absolute: 0.1 10*3/uL (ref 0.0–0.5)
Eosinophils Relative: 1 %
HCT: 37.4 % — ABNORMAL LOW (ref 39.0–52.0)
Hemoglobin: 12.7 g/dL — ABNORMAL LOW (ref 13.0–17.0)
Immature Granulocytes: 0 %
Lymphocytes Relative: 26 %
Lymphs Abs: 1.7 10*3/uL (ref 0.7–4.0)
MCH: 33 pg (ref 26.0–34.0)
MCHC: 34 g/dL (ref 30.0–36.0)
MCV: 97.1 fL (ref 80.0–100.0)
Monocytes Absolute: 0.7 10*3/uL (ref 0.1–1.0)
Monocytes Relative: 11 %
Neutro Abs: 4 10*3/uL (ref 1.7–7.7)
Neutrophils Relative %: 61 %
Platelet Count: 150 10*3/uL (ref 150–400)
RBC: 3.85 MIL/uL — ABNORMAL LOW (ref 4.22–5.81)
RDW: 13.2 % (ref 11.5–15.5)
WBC Count: 6.5 10*3/uL (ref 4.0–10.5)
nRBC: 0 % (ref 0.0–0.2)

## 2020-05-27 NOTE — Progress Notes (Signed)
[place] [phone]   [phone]  OFFICE PROGRESS NOTE  Aura Dials, MD [place] [address]  DIAGNOSIS: Stage IV (T3, N0, M1c) non-small cell lung cancer, adenocarcinoma presented with cavitary left lower lobe lung mass in addition to other pulmonary nodules and scattered hypermetabolic osseous metastatic disease involving the spine, ribs and bony pelvis as well as left proximal humeral pathologic fracture.   Molecular Biomarkers performed by [employer]S) / [name]      [id number]  Afatinib, Dacomitinib, Erlotinib, Gefitinib, Osimertinib, Ramucirumab Neratinib  FGFR1Amplification  Erdafitinib, Lenvatinib, Nintedanib, Pazopanib, Pemigatinib, Ponatinib  [id number] None  PRIOR THERAPY: None  CURRENT THERAPY:  1) Palliative radiotherapy to the the left humerus under the care of Dr. Sondra Come. Last treatment expected on 04/24/2019 2) Targeted therapy with Tagrisso 80 p.o. daily.  He started the first dose on April 21, 2019.  Status post 13 months of treatment.  INTERVAL HISTORY: Albert Perez 69 y.o. male returns to the clinic today for follow-up visit.  The patient is feeling fine today with no concerning complaints except for few episodes of diarrhea and he takes Imodium on as-needed basis.  He denied having any skin rash or itching.  He has no chest pain, shortness of breath, cough or hemoptysis.  He has no nausea, vomiting, abdominal pain or constipation.  He has no headache or visual changes.  He continues to tolerate his treatment with Tagrisso fairly well.  The patient is here today for evaluation and repeat blood work.   MEDICAL HISTORY: Past Medical History:  Diagnosis Date  . A-fib [facility])    PMH: 2000-2001  . Arthritis   . Cancer ([place])    melanoma 2018  . DVT (deep venous thrombosis) (H[facility])    Left brachial vein DVT (01/30/19, following fracture)  . ETOH abuse    . GERD (gastroesophageal reflux disease)   . Lung cancer ([place]) dx'd 02/26/2019  . Metastatic cancer to bone ([place]) dx'd 02/26/2019  . Sleep apnea    do not wear CPAP  . Stroke [facility])    " mini"  . Vertigo   . Wears glasses     ALLERGIES:  has No Known Allergies.  MEDICATIONS:  Current Outpatient Medications  Medication Sig Dispense Refill  . acetaminophen (TYLENOL) 650 MG CR tablet Take 1,300 mg by mouth every 8 (eight) hours as needed for pain.    Marland Kitchen aspirin 81 MG tablet Take 1 tablet (81 mg total) by mouth daily.    . carvedilol (COREG) 6.25 MG tablet TAKE 1 TABLET BY MOUTH TWICE A DAY 180 tablet 2  . Cyanocobalamin (B-12 PO) Take 1,000 mg by mouth daily.     Marland Kitchen loperamide (IMODIUM) 2 MG capsule Take by mouth as needed for diarrhea or loose stools.    . meloxicam (MOBIC) 15 MG tablet Take 15 mg by mouth daily.    . Multiple Vitamin (MULTIVITAMIN PO) Take by mouth daily. Centrum    . Omega-3 Fatty Acids (OMEGA-3 PO) Take 1,200 mg by mouth daily.     Marland Kitchen osimertinib mesylate (TAGRISSO) 80 MG tablet Take 1 tablet (80 mg total) by mouth daily. 30 tablet 2  . rosuvastatin (CRESTOR) 5 MG tablet Take 5 mg by mouth daily.  2   No current facility-administered medications for this visit.    SURGICAL HISTORY:  Past Surgical History:  Procedure Laterality Date  . ANKLE FRACTURE SURGERY Right 2003  . COLONOSCOPY  2018  . ELBOW SURGERY Left 1964  . melanoma removal  04/2017   Lt shoulder  . RETINAL DETACHMENT SURGERY Right 1971  . TONSILLECTOMY    . VIDEO BRONCHOSCOPY WITH ENDOBRONCHIAL NAVIGATION N/A 03/28/2019   Procedure: VIDEO BRONCHOSCOPY WITH ENDOBRONCHIAL NAVIGATION and Biopsies;  Surgeon: Collene Gobble, MD;  Location: [facility] OR;  Service: Thoracic;  Laterality: N/A;    REVIEW OF SYSTEMS:  A comprehensive review of systems was negative except for: Gastrointestinal: positive for diarrhea   PHYSICAL EXAMINATION: General appearance: alert, cooperative and no distress Head:  Normocephalic, without obvious abnormality, atraumatic Neck: no adenopathy, no JVD, supple, symmetrical, trachea midline and thyroid not enlarged, symmetric, no tenderness/mass/nodules Lymph nodes: Cervical, supraclavicular, and axillary nodes normal. Resp: clear to auscultation bilaterally Back: symmetric, no curvature. ROM normal. No CVA tenderness. Cardio: regular rate and rhythm, S1, S2 normal, no murmur, click, rub or gallop GI: soft, non-tender; bowel sounds normal; no masses,  no organomegaly Extremities: extremities normal, atraumatic, no cyanosis or edema   ECOG PERFORMANCE STATUS: 1 - Symptomatic but completely ambulatory  Blood pressure (!) 152/58, pulse (!) 48, temperature (!) 97.3 F (36.3 C), temperature source Tympanic, resp. rate 16, height _0  (1.702 m), weight 176 lb 4.8 oz (80 kg), SpO2 100 %.  LABORATORY DATA: Lab Results  Component Value Date   WBC 6.5 05/27/2020   HGB 12.7 (L) 05/27/2020   HCT 37.4 (L) 05/27/2020   MCV 97.1 05/27/2020   PLT 150 05/27/2020      Chemistry      Component Value Date/Time   NA 141 04/15/2020 1352   K 4.6 04/15/2020 1352   CL 106 04/15/2020 1352   CO2 32 04/15/2020 1352   BUN 17 04/15/2020 1352   CREATININE 1.24 04/15/2020 1352      Component Value Date/Time   CALCIUM 9.6 04/15/2020 1352   ALKPHOS 95 04/15/2020 1352   AST 24 04/15/2020 1352   ALT 27 04/15/2020 1352   BILITOT 0.5 04/15/2020 1352       RADIOGRAPHIC STUDIES: No results found.  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  He is status post palliative radiotherapy  to the metastatic disease in the left arm. The patient is currently undergoing treatment with target therapy with Tagrisso 80 mg p.o. daily started 13 months ago. The patient continues to tolerate his treatment with Tagrisso fairly well with no concerning adverse effect except for 1 or 2 episodes of diarrhea improved with Imodium. I recommended for him to continue his current treatment with Tagrisso with the same dose. I will see him back for follow-up visit in 1 months for evaluation and repeat blood work. He was advised to call immediately if he has any concerning symptoms in the interval. The patient voices understanding of current disease status and treatment options and is in agreement with the current care plan. All questions were answered. The patient knows to call the clinic with any problems, questions or concerns. We can certainly see the patient much sooner if necessary.   Disclaimer: This note was dictated with voice recognition software. Similar sounding words can inadvertently be transcribed and may not  be corrected upon review.

## 2020-05-27 NOTE — Patient Instructions (Signed)
Steps to Quit Smoking Smoking tobacco is the leading cause of preventable death. It can affect almost every organ in the body. Smoking puts you and people around you at risk for many serious, long-lasting (chronic) diseases. Quitting smoking can be hard, but it is one of the best things that you can do for your health. It is never too late to quit. How do I get ready to quit? When you decide to quit smoking, make a plan to help you succeed. Before you quit:  Pick a date to quit. Set a date within the next 2 weeks to give you time to prepare.  Write down the reasons why you are quitting. Keep this list in places where you will see it often.  Tell your family, friends, and co-workers that you are quitting. Their support is important.  Talk with your doctor about the choices that may help you quit.  Find out if your health insurance will pay for these treatments.  Know the people, places, things, and activities that make you want to smoke (triggers). Avoid them. What first steps can I take to quit smoking?  Throw away all cigarettes at home, at work, and in your car.  Throw away the things that you use when you smoke, such as ashtrays and lighters.  Clean your car. Make sure to empty the ashtray.  Clean your home, including curtains and carpets. What can I do to help me quit smoking? Talk with your doctor about taking medicines and seeing a counselor at the same time. You are more likely to succeed when you do both.  If you are pregnant or breastfeeding, talk with your doctor about counseling or other ways to quit smoking. Do not take medicine to help you quit smoking unless your doctor tells you to do so. To quit smoking: Quit right away  Quit smoking totally, instead of slowly cutting back on how much you smoke over a period of time.  Go to counseling. You are more likely to quit if you go to counseling sessions regularly. Take medicine You may take medicines to help you quit. Some  medicines need a prescription, and some you can buy over-the-counter. Some medicines may contain a drug called nicotine to replace the nicotine in cigarettes. Medicines may:  Help you to stop having the desire to smoke (cravings).  Help to stop the problems that come when you stop smoking (withdrawal symptoms). Your doctor may ask you to use:  Nicotine patches, gum, or lozenges.  Nicotine inhalers or sprays.  Non-nicotine medicine that is taken by mouth. Find resources Find resources and other ways to help you quit smoking and remain smoke-free after you quit. These resources are most helpful when you use them often. They include:  Online chats with a counselor.  Phone quitlines.  Printed self-help materials.  Support groups or group counseling.  Text messaging programs.  Mobile phone apps. Use apps on your mobile phone or tablet that can help you stick to your quit plan. There are many free apps for mobile phones and tablets as well as websites. Examples include Quit Guide from the CDC and smokefree.gov  What things can I do to make it easier to quit?   Talk to your family and friends. Ask them to support and encourage you.  Call a phone quitline (1-800-QUIT-NOW), reach out to support groups, or work with a counselor.  Ask people who smoke to not smoke around you.  Avoid places that make you want to smoke,   such as: ? Bars. ? Parties. ? Smoke-break areas at work.  Spend time with people who do not smoke.  Lower the stress in your life. Stress can make you want to smoke. Try these things to help your stress: ? Getting regular exercise. ? Doing deep-breathing exercises. ? Doing yoga. ? Meditating. ? Doing a body scan. To do this, close your eyes, focus on one area of your body at a time from head to toe. Notice which parts of your body are tense. Try to relax the muscles in those areas. How will I feel when I quit smoking? Day 1 to 3 weeks Within the first 24 hours,  you may start to have some problems that come from quitting tobacco. These problems are very bad 2-3 days after you quit, but they do not often last for more than 2-3 weeks. You may get these symptoms:  Mood swings.  Feeling restless, nervous, angry, or annoyed.  Trouble concentrating.  Dizziness.  Strong desire for high-sugar foods and nicotine.  Weight gain.  Trouble pooping (constipation).  Feeling like you may vomit (nausea).  Coughing or a sore throat.  Changes in how the medicines that you take for other issues work in your body.  Depression.  Trouble sleeping (insomnia). Week 3 and afterward After the first 2-3 weeks of quitting, you may start to notice more positive results, such as:  Better sense of smell and taste.  Less coughing and sore throat.  Slower heart rate.  Lower blood pressure.  Clearer skin.  Better breathing.  Fewer sick days. Quitting smoking can be hard. Do not give up if you fail the first time. Some people need to try a few times before they succeed. Do your best to stick to your quit plan, and talk with your doctor if you have any questions or concerns. Summary  Smoking tobacco is the leading cause of preventable death. Quitting smoking can be hard, but it is one of the best things that you can do for your health.  When you decide to quit smoking, make a plan to help you succeed.  Quit smoking right away, not slowly over a period of time.  When you start quitting, seek help from your doctor, family, or friends. This information is not intended to replace advice given to you by your health care provider. Make sure you discuss any questions you have with your health care provider. Document Revised: 03/16/2019 Document Reviewed: 09/09/2018 Elsevier Patient Education  2020 [employer]  

## 2020-06-06 ENCOUNTER — Other Ambulatory Visit: Payer: Self-pay

## 2020-06-06 ENCOUNTER — Encounter: Payer: Self-pay | Admitting: Cardiovascular Disease

## 2020-06-06 ENCOUNTER — Ambulatory Visit: Payer: Medicare Other | Admitting: Cardiovascular Disease

## 2020-06-06 VITALS — BP 133/64 | HR 52 | Ht 67.0 in | Wt 177.8 lb

## 2020-06-06 DIAGNOSIS — I10 Essential (primary) hypertension: Secondary | ICD-10-CM | POA: Diagnosis not present

## 2020-06-06 DIAGNOSIS — R931 Abnormal findings on diagnostic imaging of heart and coronary circulation: Secondary | ICD-10-CM

## 2020-06-06 DIAGNOSIS — I471 Supraventricular tachycardia: Secondary | ICD-10-CM

## 2020-06-06 DIAGNOSIS — E782 Mixed hyperlipidemia: Secondary | ICD-10-CM

## 2020-06-06 DIAGNOSIS — Z8679 Personal history of other diseases of the circulatory system: Secondary | ICD-10-CM

## 2020-06-06 DIAGNOSIS — G4733 Obstructive sleep apnea (adult) (pediatric): Secondary | ICD-10-CM | POA: Diagnosis not present

## 2020-06-06 NOTE — Progress Notes (Signed)
06/06/2020 [name]   01/14/1951  [id number]  Primary Physician Aura Dials, MD Primary Cardiologist: Lorretta Harp MD FACP, [place], [place], [place]  HPI:  Albert Perez is a 69 y.o.   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  I last saw him in the office 12/05/2019.His cardiac risk factors include long history tobacco abuse and treated hyperlipidemia. There is no family history of heart disease. He is never had a heart heart attack but did have a stroke in 2018 without neurologic deficits. He denies chest pain or shortness of breath. He does lift weights frequently. He has 2 drinks of alcohol a night. He smoked a half a pack a day for 50 years although he did stop for 22 years during that time. He cut back to 3 cigarettes a day 6 months ago and is trying to stop. He does have a diagnosis of lung cancer. He does have obstructive sleep apnea but is intolerant to CPAP. He had a brief episode of A. fib back in 2000 which he relates to increase stress, alcohol and caffeine. He changed his lifestyle and this resolved. He was not anticoagulated. He does admit episodes of tachypalpitations that occur on a monthly basis that are fairly self-limited. When he was seen in the emergency room for palpitations he appeared to be in SVT versus a flutter with 2 1 block and he converted with Valsalva. Of note, he did have a chest CT performed on 06/07/2023 lung cancer screening that did show coronary calcification.  I performed a coronary calcium score on 06/25/2019 which was 640.  I also obtain an event monitor 06/26/2019 revealing episodes of SVT, PAF and nonsustained ventricular tachycardia.  I did refer him to Dr. Curt Bears for EP evaluation 10/29/2019 who began him on carvedilol.  His palpitations have  improved.  He did have a normal 2D echo back in 2019.    Since I saw him 6 months ago he continues to do well.  Episodes of palpitations are fairly infrequent now occurring at most once a month and are very self-limited.  He denies chest pain or shortness of breath.  Current Meds  Medication Sig  . acetaminophen (TYLENOL) 650 MG CR tablet Take 1,300 mg by mouth every 8 (eight) hours as needed for pain.  Marland Kitchen aspirin 81 MG tablet Take 1 tablet (81 mg total) by mouth daily.  . carvedilol (COREG) 6.25 MG tablet TAKE 1 TABLET BY MOUTH TWICE A DAY  . Cyanocobalamin (B-12 PO) Take 1,000 mg by mouth daily.   Marland Kitchen loperamide (IMODIUM) 2 MG capsule Take by mouth as needed for diarrhea or loose stools.  . meloxicam (MOBIC) 15 MG tablet Take 15 mg by mouth daily.  . Multiple Vitamin (MULTIVITAMIN PO) Take by mouth daily. Centrum  . Omega-3 Fatty Acids (OMEGA-3 PO) Take 1,200 mg by mouth daily.   Marland Kitchen osimertinib mesylate (TAGRISSO) 80 MG tablet Take 1 tablet (80 mg total) by mouth daily.  . rosuvastatin (CRESTOR) 5 MG tablet Take 5 mg by mouth daily.     No Known Allergies  Social History   Socioeconomic History  . Marital status: Married    Spouse name: Not on file  . Number of children: 2  . Years of education: 46  . Highest education level: Some college, no degree  Occupational  History  . Not on file  Tobacco Use  . Smoking status: Current Every Day Smoker    Packs/day: 0.25    Years: 20.00    Pack years: 5.00    Types: Cigarettes    Last attempt to quit: 03/06/2019    Years since quitting: 1.2  . Smokeless tobacco: Never Used  Vaping Use  . Vaping Use: Every day  Substance and Sexual Activity  . Alcohol use: Yes    Alcohol/week: 24.0 standard drinks    Types: 24 Cans of beer per week    Comment: 3 -4 drinks of Vodka daily  . Drug use: Yes    Types: Marijuana    Comment: CBD oil  . Sexual activity: Not on file  Other Topics Concern  . Not on file  Social History Narrative   Lives at  home with wife, daughter and grandson   Right handed   Caffeine: sodas 2 per day   Social Determinants of Health   Financial Resource Strain:   . Difficulty of Paying Living Expenses: Not on file  Food Insecurity:   . Worried About Charity fundraiser in the Last Year: Not on file  . Ran Out of Food in the Last Year: Not on file  Transportation Needs:   . Lack of Transportation (Medical): Not on file  . Lack of Transportation (Non-Medical): Not on file  Physical Activity:   . Days of Exercise per Week: Not on file  . Minutes of Exercise per Session: Not on file  Stress:   . Feeling of Stress : Not on file  Social Connections:   . Frequency of Communication with Friends and Family: Not on file  . Frequency of Social Gatherings with Friends and Family: Not on file  . Attends Religious Services: Not on file  . Active Member of Clubs or Organizations: Not on file  . Attends Archivist Meetings: Not on file  . Marital Status: Not on file  Intimate Partner Violence:   . Fear of Current or Ex-Partner: Not on file  . Emotionally Abused: Not on file  . Physically Abused: Not on file  . Sexually Abused: Not on file     Review of Systems: General: negative for chills, fever, night sweats or weight changes.  Cardiovascular: negative for chest pain, dyspnea on exertion, edema, orthopnea, palpitations, paroxysmal nocturnal dyspnea or shortness of breath Dermatological: negative for rash Respiratory: negative for cough or wheezing Urologic: negative for hematuria Abdominal: negative for nausea, vomiting, diarrhea, bright red blood per rectum, melena, or hematemesis Neurologic: negative for visual changes, syncope, or dizziness All other systems reviewed and are otherwise negative except as noted above.    Blood pressure 133/64, pulse (!) 52, height 5\' 7"  (1.702 m), weight 177 lb 12.8 oz (80.6 kg), SpO2 100 %.  General appearance: alert and no distress Neck: no adenopathy, no  carotid bruit, no JVD, supple, symmetrical, trachea midline and thyroid not enlarged, symmetric, no tenderness/mass/nodules Lungs: clear to auscultation bilaterally Heart: regular rate and rhythm, S1, S2 normal, no murmur, click, rub or gallop Extremities: extremities normal, atraumatic, no cyanosis or edema Pulses: 2+ and symmetric Skin: Skin color, texture, turgor normal. No rashes or lesions Neurologic: Alert and oriented X 3, normal strength and tone. Normal symmetric reflexes. Normal coordination and gait  EKG sinus bradycardia 52 without ST or T wave changes.  I personally reviewed this EKG.  ASSESSMENT AND PLAN:   Essential hypertension History of essential hypertension blood pressure  measured today 133/64.  He is on low-dose carvedilol.  Severe obstructive sleep apnea-hypopnea syndrome History of obstructive sleep apnea intolerant to CPAP  SVT (supraventricular tachycardia) (HCC) History of SVT in the past currently on carvedilol with marked improvement in his palpitation symptoms  Elevated coronary artery calcium score History of elevated coronary calcium score of 640 performed 06/25/2019.  He denies chest pain.  He is on a low-dose statin drug with an LDL of 55 performed 05/06/2020  Hyperlipidemia History of hyperlipidemia on low-dose statin therapy with lipid profile performed 05/06/2020 revealing total cholesterol 128, LDL 55 and HDL 60.      Lorretta Harp MD FACP,FACC,FAHA, [facility] 06/06/2020 11:30 AM

## 2020-06-06 NOTE — Patient Instructions (Signed)

## 2020-06-06 NOTE — Assessment & Plan Note (Signed)
History of elevated coronary calcium score of 640 performed 06/25/2019.  He denies chest pain.  He is on a low-dose statin drug with an LDL of 55 performed 05/06/2020

## 2020-06-06 NOTE — Assessment & Plan Note (Signed)
History of SVT in the past currently on carvedilol with marked improvement in his palpitation symptoms

## 2020-06-06 NOTE — Assessment & Plan Note (Signed)
History of hyperlipidemia on low-dose statin therapy with lipid profile performed 05/06/2020 revealing total cholesterol 128, LDL 55 and HDL 60.

## 2020-06-06 NOTE — Assessment & Plan Note (Signed)
History of obstructive sleep apnea intolerant to CPAP

## 2020-06-06 NOTE — Assessment & Plan Note (Signed)
History of essential hypertension blood pressure measured today 133/64.  He is on low-dose carvedilol.

## 2020-06-10 NOTE — Telephone Encounter (Signed)
Oral Oncology Patient Advocate Encounter  Met patient in [place] to complete re-en[place]ment [place] for [place] in an effort to reduce the patient's out of pocket expense for Tagrisso to $0.    Application completed and faxed to [phone].   [place] patient assistance phone number for follow up is [phone].   This encounter will be updated until final determination.   [place] Patient [place] Phone [phone] Fax [phone] 06/10/2020 2:39 PM

## 2020-06-19 ENCOUNTER — Other Ambulatory Visit: Payer: Self-pay | Admitting: Internal Medicine

## 2020-06-19 ENCOUNTER — Encounter: Payer: Self-pay | Admitting: Internal Medicine

## 2020-06-19 DIAGNOSIS — C3492 Malignant neoplasm of unspecified part of left bronchus or lung: Secondary | ICD-10-CM

## 2020-06-19 MED ORDER — OSIMERTINIB MESYLATE 80 MG PO TABS: 80.0000 mg | ORAL_TABLET | Freq: Every day | ORAL | 2 refills | Status: DC

## 2020-06-24 ENCOUNTER — Other Ambulatory Visit: Payer: Self-pay

## 2020-06-24 ENCOUNTER — Encounter: Payer: Self-pay | Admitting: Internal Medicine

## 2020-06-24 ENCOUNTER — Inpatient Hospital Stay: Payer: Medicare Other | Admitting: Internal Medicine

## 2020-06-24 ENCOUNTER — Inpatient Hospital Stay: Payer: Medicare Other | Attending: Internal Medicine

## 2020-06-24 VITALS — BP 143/68 | HR 56 | Temp 97.5°F | Resp 18 | Ht 67.0 in | Wt 176.0 lb

## 2020-06-24 DIAGNOSIS — C7951 Secondary malignant neoplasm of bone: Secondary | ICD-10-CM | POA: Insufficient documentation

## 2020-06-24 DIAGNOSIS — C3492 Malignant neoplasm of unspecified part of left bronchus or lung: Secondary | ICD-10-CM | POA: Diagnosis not present

## 2020-06-24 DIAGNOSIS — G4730 Sleep apnea, unspecified: Secondary | ICD-10-CM | POA: Insufficient documentation

## 2020-06-24 DIAGNOSIS — I4891 Unspecified atrial fibrillation: Secondary | ICD-10-CM | POA: Insufficient documentation

## 2020-06-24 DIAGNOSIS — Z79899 Other long term (current) drug therapy: Secondary | ICD-10-CM | POA: Diagnosis not present

## 2020-06-24 DIAGNOSIS — C3432 Malignant neoplasm of lower lobe, left bronchus or lung: Secondary | ICD-10-CM | POA: Diagnosis not present

## 2020-06-24 DIAGNOSIS — Z5111 Encounter for antineoplastic chemotherapy: Secondary | ICD-10-CM | POA: Diagnosis not present

## 2020-06-24 DIAGNOSIS — C3490 Malignant neoplasm of unspecified part of unspecified bronchus or lung: Secondary | ICD-10-CM | POA: Diagnosis not present

## 2020-06-24 DIAGNOSIS — Z86718 Personal history of other venous thrombosis and embolism: Secondary | ICD-10-CM | POA: Insufficient documentation

## 2020-06-24 DIAGNOSIS — K219 Gastro-esophageal reflux disease without esophagitis: Secondary | ICD-10-CM | POA: Insufficient documentation

## 2020-06-24 DIAGNOSIS — Z8673 Personal history of transient ischemic attack (TIA), and cerebral infarction without residual deficits: Secondary | ICD-10-CM | POA: Diagnosis not present

## 2020-06-24 DIAGNOSIS — Z85820 Personal history of malignant melanoma of skin: Secondary | ICD-10-CM | POA: Insufficient documentation

## 2020-06-24 DIAGNOSIS — Z7982 Long term (current) use of aspirin: Secondary | ICD-10-CM | POA: Insufficient documentation

## 2020-06-24 LAB — CMP (CANCER CENTER ONLY)
ALT: 29 U/L (ref 0–44)
AST: 27 U/L (ref 15–41)
Albumin: 3.9 g/dL (ref 3.5–5.0)
Alkaline Phosphatase: 98 U/L (ref 38–126)
Anion gap: 7 (ref 5–15)
BUN: 17 mg/dL (ref 8–23)
CO2: 25 mmol/L (ref 22–32)
Calcium: 9.4 mg/dL (ref 8.9–10.3)
Chloride: 106 mmol/L (ref 98–111)
Creatinine: 1.3 mg/dL — ABNORMAL HIGH (ref 0.61–1.24)
GFR, Estimated: 59 mL/min — ABNORMAL LOW (ref >60)
Glucose, Bld: 99 mg/dL (ref 70–99)
Potassium: 4.5 mmol/L (ref 3.5–5.1)
Sodium: 138 mmol/L (ref 135–145)
Total Bilirubin: 0.8 mg/dL (ref 0.3–1.2)
Total Protein: 7 g/dL (ref 6.5–8.1)

## 2020-06-24 LAB — CBC WITH DIFFERENTIAL (CANCER CENTER ONLY)
Abs Immature Granulocytes: 0.02 10*3/uL (ref 0.00–0.07)
Basophils Absolute: 0 10*3/uL (ref 0.0–0.1)
Basophils Relative: 1 %
Eosinophils Absolute: 0.1 10*3/uL (ref 0.0–0.5)
Eosinophils Relative: 1 %
HCT: 40.5 % (ref 39.0–52.0)
Hemoglobin: 13.6 g/dL (ref 13.0–17.0)
Immature Granulocytes: 0 %
Lymphocytes Relative: 23 %
Lymphs Abs: 1.5 10*3/uL (ref 0.7–4.0)
MCH: 33.1 pg (ref 26.0–34.0)
MCHC: 33.6 g/dL (ref 30.0–36.0)
MCV: 98.5 fL (ref 80.0–100.0)
Monocytes Absolute: 0.7 10*3/uL (ref 0.1–1.0)
Monocytes Relative: 10 %
Neutro Abs: 4.3 10*3/uL (ref 1.7–7.7)
Neutrophils Relative %: 65 %
Platelet Count: 175 10*3/uL (ref 150–400)
RBC: 4.11 MIL/uL — ABNORMAL LOW (ref 4.22–5.81)
RDW: 13.5 % (ref 11.5–15.5)
WBC Count: 6.6 10*3/uL (ref 4.0–10.5)
nRBC: 0 % (ref 0.0–0.2)

## 2020-06-24 NOTE — Progress Notes (Signed)
[facility] Telephone:([phone]   [phone]  OFFICE PROGRESS NOTE  Aura Dials, MD [place] [address]  DIAGNOSIS: Stage IV (T3, N0, M1c) non-small cell lung cancer, adenocarcinoma presented with cavitary left lower lobe lung mass in addition to other pulmonary nodules and scattered hypermetabolic osseous metastatic disease involving the spine, ribs and bony pelvis as well as left proximal humeral pathologic fracture.   Molecular Biomarkers performed by [facility] [employer]ECTED ALTERATION(S) / [name]      [id number]  Afatinib, Dacomitinib, Erlotinib, Gefitinib, Osimertinib, Ramucirumab Neratinib  FGFR1Amplification  Erdafitinib, Lenvatinib, Nintedanib, Pazopanib, Pemigatinib, Ponatinib  [id number] None  PRIOR THERAPY: None  CURRENT THERAPY:  1) Palliative radiotherapy to the the left humerus under the care of Dr. Sondra Come. Last treatment expected on 04/24/2019 2) Targeted therapy with Tagrisso 80 p.o. daily.  He started the first dose on April 21, 2019.  Status post 14 months of treatment.  INTERVAL HISTORY: Albert Perez 69 y.o. male returns to the clinic today for follow-up visit. The patient is feeling fine today with no concerning complaints except for intermittent diarrhea no more than 2 times a day improved with Imodium. He denied having any chest pain, shortness of breath, cough or hemoptysis. He denied having any nausea, vomiting or constipation. He has no headache or visual changes. He denied having any significant weight loss or night sweats. He continues to tolerate his treatment with Tagrisso fairly well. The patient is here today for evaluation and repeat blood work.  MEDICAL HISTORY: Past Medical History:  Diagnosis Date  . A-fib [facility])    PMH: 2000-2001  . Arthritis   . Cancer ([place])    melanoma 2018  . DVT (deep venous thrombosis) (HCC)    Left brachial vein DVT (01/30/19, following  fracture)  . ETOH abuse   . GERD (gastroesophageal reflux disease)   . Lung cancer ([place]) dx'd 02/26/2019  . Metastatic cancer to bone ([place]) dx'd 02/26/2019  . Sleep apnea    do not wear CPAP  . Stroke [facility])    " mini"  . Vertigo   . Wears glasses     ALLERGIES:  has No Known Allergies.  MEDICATIONS:  Current Outpatient Medications  Medication Sig Dispense Refill  . acetaminophen (TYLENOL) 650 MG CR tablet Take 1,300 mg by mouth every 8 (eight) hours as needed for pain.    Marland Kitchen aspirin 81 MG tablet Take 1 tablet (81 mg total) by mouth daily.    . carvedilol (COREG) 6.25 MG tablet TAKE 1 TABLET BY MOUTH TWICE A DAY 180 tablet 2  . Cyanocobalamin (B-12 PO) Take 1,000 mg by mouth daily.     Marland Kitchen loperamide (IMODIUM) 2 MG capsule Take by mouth as needed for diarrhea or loose stools.    . meloxicam (MOBIC) 15 MG tablet Take 15 mg by mouth daily.    . Multiple Vitamin (MULTIVITAMIN PO) Take by mouth daily. Centrum    . Omega-3 Fatty Acids (OMEGA-3 PO) Take 1,200 mg by mouth daily.     Marland Kitchen osimertinib mesylate (TAGRISSO) 80 MG tablet Take 1 tablet (80 mg total) by mouth daily. 30 tablet 2  . rosuvastatin (CRESTOR) 5 MG tablet Take 5 mg by mouth daily.  2   No current facility-administered medications for this visit.    SURGICAL HISTORY:  Past Surgical History:  Procedure Laterality Date  . ANKLE FRACTURE SURGERY Right 2003  . COLONOSCOPY  2018  . ELBOW  SURGERY Left 1964  . melanoma removal  04/2017   Lt shoulder  . RETINAL DETACHMENT SURGERY Right 1971  . TONSILLECTOMY    . VIDEO BRONCHOSCOPY WITH ENDOBRONCHIAL NAVIGATION N/A 03/28/2019   Procedure: VIDEO BRONCHOSCOPY WITH ENDOBRONCHIAL NAVIGATION and Biopsies;  Surgeon: Collene Gobble, MD;  Location: [facility] OR;  Service: Thoracic;  Laterality: N/A;    REVIEW OF SYSTEMS:  A comprehensive review of systems was negative except for: Gastrointestinal: positive for diarrhea   PHYSICAL EXAMINATION: General appearance: alert, cooperative and no  distress Head: Normocephalic, without obvious abnormality, atraumatic Neck: no adenopathy, no JVD, supple, symmetrical, trachea midline and thyroid not enlarged, symmetric, no tenderness/mass/nodules Lymph nodes: Cervical, supraclavicular, and axillary nodes normal. Resp: clear to auscultation bilaterally Back: symmetric, no curvature. ROM normal. No CVA tenderness. Cardio: regular rate and rhythm, S1, S2 normal, no murmur, click, rub or gallop GI: soft, non-tender; bowel sounds normal; no masses,  no organomegaly Extremities: extremities normal, atraumatic, no cyanosis or edema   ECOG PERFORMANCE STATUS: 1 - Symptomatic but completely ambulatory  Blood pressure (!) 143/68, pulse (!) 56, temperature (!) 97.5 F (36.4 C), temperature source Tympanic, resp. rate 18, height _0  (1.702 m), weight 176 lb (79.8 kg), SpO2 100 %.  LABORATORY DATA: Lab Results  Component Value Date   WBC 6.6 06/24/2020   HGB 13.6 06/24/2020   HCT 40.5 06/24/2020   MCV 98.5 06/24/2020   PLT 175 06/24/2020      Chemistry      Component Value Date/Time   NA 140 05/27/2020 1101   K 4.6 05/27/2020 1101   CL 109 05/27/2020 1101   CO2 25 05/27/2020 1101   BUN 19 05/27/2020 1101   CREATININE 1.25 (H) 05/27/2020 1101      Component Value Date/Time   CALCIUM 9.0 05/27/2020 1101   ALKPHOS 83 05/27/2020 1101   AST 22 05/27/2020 1101   ALT 23 05/27/2020 1101   BILITOT 0.6 05/27/2020 1101       RADIOGRAPHIC STUDIES: No results found.  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  He is status post palliative  radiotherapy to the metastatic disease in the left arm. The patient is currently undergoing treatment with target therapy with Tagrisso 80 mg p.o. daily started 14 months ago. He continues to tolerate his treatment well except for intermittent diarrhea improved with Imodium. I recommended for the patient to continue his current treatment with Tagrisso with the same dose. The patient will come back for follow-up visit in 1 months for evaluation with repeat CT scan of the chest, abdomen pelvis for restaging of his disease. He was advised to call immediately if he has any concerning symptoms in the interval. The patient voices understanding of current disease status and treatment options and is in agreement with the current care plan. All questions were answered. The patient knows to call the clinic with any problems, questions or concerns. We can certainly see the patient much sooner if necessary.   Disclaimer: This note was dictated with voice recognition software. Similar sounding words can inadvertently be transcribed and may not be corrected upon review.

## 2020-07-12 ENCOUNTER — Other Ambulatory Visit: Payer: Self-pay | Admitting: Cardiology

## 2020-07-14 ENCOUNTER — Telehealth: Payer: Self-pay | Admitting: Medical Oncology

## 2020-07-14 NOTE — Telephone Encounter (Signed)
Pt was positive for COVID  8  days ago from a home test . He has no energy.He is breathing well and no fever or cough.  Per Dr Julien Nordmann pt needs to treat symptoms and  does not need  to be referred to [facility] infusion center.  Wife notified and to call for worsening symptoms and to go to ED if pt is having difficulty breathing.

## 2020-07-22 ENCOUNTER — Inpatient Hospital Stay: Payer: Medicare Other | Attending: Internal Medicine

## 2020-07-22 ENCOUNTER — Ambulatory Visit (HOSPITAL_COMMUNITY): Payer: Medicare Other

## 2020-07-23 ENCOUNTER — Other Ambulatory Visit: Payer: Self-pay | Admitting: Medical Oncology

## 2020-07-23 DIAGNOSIS — C3492 Malignant neoplasm of unspecified part of left bronchus or lung: Secondary | ICD-10-CM

## 2020-07-23 MED ORDER — OSIMERTINIB MESYLATE 80 MG PO TABS: 80.0000 mg | ORAL_TABLET | Freq: Every day | ORAL | 2 refills | Status: DC

## 2020-07-24 ENCOUNTER — Inpatient Hospital Stay: Payer: Medicare Other | Admitting: Internal Medicine

## 2020-07-30 ENCOUNTER — Telehealth: Payer: Self-pay | Admitting: Medical Oncology

## 2020-07-30 NOTE — Telephone Encounter (Signed)
Left SMS message to call me back . Let me know if he got his [facility] delivery worked out.

## 2020-08-01 NOTE — Telephone Encounter (Signed)
Patient is approved for Tagrisso at  No charge form [place] 07/30/20-07/04/21  [place] Patient [place] Phone [phone] Fax [phone] 08/01/2020 8:27 AM

## 2020-08-04 ENCOUNTER — Telehealth: Payer: Self-pay | Admitting: Medical Oncology

## 2020-08-04 ENCOUNTER — Encounter: Payer: Self-pay | Admitting: Internal Medicine

## 2020-08-04 DIAGNOSIS — G8929 Other chronic pain: Secondary | ICD-10-CM | POA: Diagnosis not present

## 2020-08-04 DIAGNOSIS — I10 Essential (primary) hypertension: Secondary | ICD-10-CM | POA: Diagnosis not present

## 2020-08-04 DIAGNOSIS — E782 Mixed hyperlipidemia: Secondary | ICD-10-CM | POA: Diagnosis not present

## 2020-08-04 DIAGNOSIS — M170 Bilateral primary osteoarthritis of knee: Secondary | ICD-10-CM | POA: Diagnosis not present

## 2020-08-04 DIAGNOSIS — C3432 Malignant neoplasm of lower lobe, left bronchus or lung: Secondary | ICD-10-CM | POA: Diagnosis not present

## 2020-08-04 MED ORDER — OSIMERTINIB MESYLATE 80 MG PO TABS: 80.0000 mg | ORAL_TABLET | Freq: Every day | ORAL | 2 refills | Status: DC

## 2020-08-04 NOTE — Telephone Encounter (Signed)
COVID exposure 6 days ago . No symptoms.  Please r/s Ct scan and f/u. Pt  has number to do that.

## 2020-08-04 NOTE — Progress Notes (Deleted)
[facility] OFFICE PROGRESS NOTE  Aura Dials, MD [place] [address]  DIAGNOSIS: ***  PRIOR THERAPY:  CURRENT THERAPY:  INTERVAL HISTORY: Albert Perez 70 y.o. male returns for *** regular *** visit for followup of ***   MEDICAL HISTORY: Past Medical History:  Diagnosis Date  . A-fib [facility])    PMH: 2000-2001  . Arthritis   . Cancer ([place])    melanoma 2018  . DVT (deep venous thrombosis) (HCC)    Left brachial vein DVT (01/30/19, following fracture)  . ETOH abuse   . GERD (gastroesophageal reflux disease)   . Lung cancer ([place]) dx'd 02/26/2019  . Metastatic cancer to bone ([place]) dx'd 02/26/2019  . Sleep apnea    do not wear CPAP  . Stroke [facility])    " mini"  . Vertigo   . Wears glasses     ALLERGIES:  has No Known Allergies.  MEDICATIONS:  Current Outpatient Medications  Medication Sig Dispense Refill  . acetaminophen (TYLENOL) 650 MG CR tablet Take 1,300 mg by mouth every 8 (eight) hours as needed for pain.    Marland Kitchen aspirin 81 MG tablet Take 1 tablet (81 mg total) by mouth daily.    . carvedilol (COREG) 6.25 MG tablet TAKE 1 TABLET BY MOUTH TWICE A DAY 180 tablet 0  . Cyanocobalamin (B-12 PO) Take 1,000 mg by mouth daily.     Marland Kitchen loperamide (IMODIUM) 2 MG capsule Take by mouth as needed for diarrhea or loose stools.    . meloxicam (MOBIC) 15 MG tablet Take 15 mg by mouth daily.    . Multiple Vitamin (MULTIVITAMIN PO) Take by mouth daily. Centrum    . Omega-3 Fatty Acids (OMEGA-3 PO) Take 1,200 mg by mouth daily.     Marland Kitchen osimertinib mesylate (TAGRISSO) 80 MG tablet Take 1 tablet (80 mg total) by mouth daily. Escribed to [facility] for  [place] under [place] program.effective 07/30/20-07/04/21 All refills can be escribed to [facility].. 30 tablet 2  . rosuvastatin (CRESTOR) 5 MG tablet Take 5 mg by mouth daily.  2   No current facility-administered medications for this visit.    SURGICAL HISTORY:  Past Surgical History:  Procedure  Laterality Date  . ANKLE FRACTURE SURGERY Right 2003  . COLONOSCOPY  2018  . ELBOW SURGERY Left 1964  . melanoma removal  04/2017   Lt shoulder  . RETINAL DETACHMENT SURGERY Right 1971  . TONSILLECTOMY    . VIDEO BRONCHOSCOPY WITH ENDOBRONCHIAL NAVIGATION N/A 03/28/2019   Procedure: VIDEO BRONCHOSCOPY WITH ENDOBRONCHIAL NAVIGATION and Biopsies;  Surgeon: Collene Gobble, MD;  Location: [facility] OR;  Service: Thoracic;  Laterality: N/A;    REVIEW OF SYSTEMS:   Review of Systems  Constitutional: Negative for appetite change, chills, fatigue, fever and unexpected weight change.  HENT:   Negative for mouth sores, nosebleeds, sore throat and trouble swallowing.   Eyes: Negative for eye problems and icterus.  Respiratory: Negative for cough, hemoptysis, shortness of breath and wheezing.   Cardiovascular: Negative for chest pain and leg swelling.  Gastrointestinal: Negative for abdominal pain, constipation, diarrhea, nausea and vomiting.  Genitourinary: Negative for bladder incontinence, difficulty urinating, dysuria, frequency and hematuria.   Musculoskeletal: Negative for back pain, gait problem, neck pain and neck stiffness.  Skin: Negative for itching and rash.  Neurological: Negative for dizziness, extremity weakness, gait problem, headaches, light-headedness and seizures.  Hematological: Negative for adenopathy. Does not bruise/bleed easily.  Psychiatric/Behavioral: Negative for  confusion, depression and sleep disturbance. The patient is not nervous/anxious.     PHYSICAL EXAMINATION:  There were no vitals taken for this visit.  ECOG PERFORMANCE STATUS: {CHL ONC ECOG [id number]  Physical Exam  Constitutional: Oriented to person, place, and time and well-developed, well-nourished, and in no distress. No distress.  HENT:  Head: Normocephalic and atraumatic.  Mouth/Throat: Oropharynx is clear and moist. No oropharyngeal exudate.  Eyes: Conjunctivae are normal. Right eye exhibits no  discharge. Left eye exhibits no discharge. No scleral icterus.  Neck: Normal range of motion. Neck supple.  Cardiovascular: Normal rate, regular rhythm, normal heart sounds and intact distal pulses.   Pulmonary/Chest: Effort normal and breath sounds normal. No respiratory distress. No wheezes. No rales.  Abdominal: Soft. Bowel sounds are normal. Exhibits no distension and no mass. There is no tenderness.  Musculoskeletal: Normal range of motion. Exhibits no edema.  Lymphadenopathy:    No cervical adenopathy.  Neurological: Alert and oriented to person, place, and time. Exhibits normal muscle tone. Gait normal. Coordination normal.  Skin: Skin is warm and dry. No rash noted. Not diaphoretic. No erythema. No pallor.  Psychiatric: Mood, memory and judgment normal.  Vitals reviewed.  LABORATORY DATA: Lab Results  Component Value Date   WBC 6.6 06/24/2020   HGB 13.6 06/24/2020   HCT 40.5 06/24/2020   MCV 98.5 06/24/2020   PLT 175 06/24/2020      Chemistry      Component Value Date/Time   NA 138 06/24/2020 1302   K 4.5 06/24/2020 1302   CL 106 06/24/2020 1302   CO2 25 06/24/2020 1302   BUN 17 06/24/2020 1302   CREATININE 1.30 (H) 06/24/2020 1302      Component Value Date/Time   CALCIUM 9.4 06/24/2020 1302   ALKPHOS 98 06/24/2020 1302   AST 27 06/24/2020 1302   ALT 29 06/24/2020 1302   BILITOT 0.8 06/24/2020 1302       RADIOGRAPHIC STUDIES:  No results found.   ASSESSMENT/PLAN:  No problem-specific Assessment & Plan notes found for this encounter.   No orders of the defined types were placed in this encounter.    I spent {CHL ONC TIME VISIT - KFEXM:[id number]} counseling the patient face to face. The total time spent in the appointment was {CHL ONC TIME VISIT - BBUYZ:[id number]}.  [place], PA-C 08/04/20

## 2020-08-04 NOTE — Addendum Note (Signed)
Addended by: Ardeen Garland on: 08/04/2020 10:49 AM   Modules accepted: Orders

## 2020-08-04 NOTE — Telephone Encounter (Signed)
Tagrisso refill. LVM on [facility] with refill order and pt mailing address for delivery: [address] [place] [phone]

## 2020-08-05 ENCOUNTER — Ambulatory Visit (HOSPITAL_COMMUNITY): Payer: Medicare Other

## 2020-08-05 ENCOUNTER — Inpatient Hospital Stay: Payer: Medicare Other

## 2020-08-06 ENCOUNTER — Encounter: Payer: Self-pay | Admitting: Internal Medicine

## 2020-08-07 ENCOUNTER — Inpatient Hospital Stay: Payer: Medicare Other | Admitting: Physician Assistant

## 2020-08-21 ENCOUNTER — Ambulatory Visit (HOSPITAL_COMMUNITY)
Admission: RE | Admit: 2020-08-21 | Discharge: 2020-08-21 | Disposition: A | Discharge status: Home / Self Care | Payer: Medicare Other | Source: Ambulatory Visit | Attending: Internal Medicine | Admitting: Internal Medicine

## 2020-08-21 ENCOUNTER — Other Ambulatory Visit: Payer: Self-pay

## 2020-08-21 ENCOUNTER — Inpatient Hospital Stay: Payer: Medicare Other | Attending: Internal Medicine

## 2020-08-21 DIAGNOSIS — Z85820 Personal history of malignant melanoma of skin: Secondary | ICD-10-CM | POA: Insufficient documentation

## 2020-08-21 DIAGNOSIS — Z7982 Long term (current) use of aspirin: Secondary | ICD-10-CM | POA: Insufficient documentation

## 2020-08-21 DIAGNOSIS — C3432 Malignant neoplasm of lower lobe, left bronchus or lung: Secondary | ICD-10-CM | POA: Insufficient documentation

## 2020-08-21 DIAGNOSIS — N281 Cyst of kidney, acquired: Secondary | ICD-10-CM | POA: Diagnosis not present

## 2020-08-21 DIAGNOSIS — K219 Gastro-esophageal reflux disease without esophagitis: Secondary | ICD-10-CM | POA: Insufficient documentation

## 2020-08-21 DIAGNOSIS — Z79899 Other long term (current) drug therapy: Secondary | ICD-10-CM | POA: Diagnosis not present

## 2020-08-21 DIAGNOSIS — M4319 Spondylolisthesis, multiple sites in spine: Secondary | ICD-10-CM | POA: Diagnosis not present

## 2020-08-21 DIAGNOSIS — C3490 Malignant neoplasm of unspecified part of unspecified bronchus or lung: Secondary | ICD-10-CM | POA: Insufficient documentation

## 2020-08-21 DIAGNOSIS — I2510 Atherosclerotic heart disease of native coronary artery without angina pectoris: Secondary | ICD-10-CM | POA: Diagnosis not present

## 2020-08-21 DIAGNOSIS — C7951 Secondary malignant neoplasm of bone: Secondary | ICD-10-CM | POA: Insufficient documentation

## 2020-08-21 DIAGNOSIS — Z8673 Personal history of transient ischemic attack (TIA), and cerebral infarction without residual deficits: Secondary | ICD-10-CM | POA: Diagnosis not present

## 2020-08-21 DIAGNOSIS — G4730 Sleep apnea, unspecified: Secondary | ICD-10-CM | POA: Insufficient documentation

## 2020-08-21 DIAGNOSIS — Z8616 Personal history of COVID-19: Secondary | ICD-10-CM | POA: Insufficient documentation

## 2020-08-21 DIAGNOSIS — Z86718 Personal history of other venous thrombosis and embolism: Secondary | ICD-10-CM | POA: Diagnosis not present

## 2020-08-21 DIAGNOSIS — I4891 Unspecified atrial fibrillation: Secondary | ICD-10-CM | POA: Insufficient documentation

## 2020-08-21 DIAGNOSIS — M47816 Spondylosis without myelopathy or radiculopathy, lumbar region: Secondary | ICD-10-CM | POA: Diagnosis not present

## 2020-08-21 DIAGNOSIS — K4090 Unilateral inguinal hernia, without obstruction or gangrene, not specified as recurrent: Secondary | ICD-10-CM | POA: Diagnosis not present

## 2020-08-21 LAB — CBC WITH DIFFERENTIAL (CANCER CENTER ONLY)
Abs Immature Granulocytes: 0.02 10*3/uL (ref 0.00–0.07)
Basophils Absolute: 0 10*3/uL (ref 0.0–0.1)
Basophils Relative: 1 %
Eosinophils Absolute: 0.1 10*3/uL (ref 0.0–0.5)
Eosinophils Relative: 2 %
HCT: 40.5 % (ref 39.0–52.0)
Hemoglobin: 13.3 g/dL (ref 13.0–17.0)
Immature Granulocytes: 0 %
Lymphocytes Relative: 27 %
Lymphs Abs: 1.7 10*3/uL (ref 0.7–4.0)
MCH: 32.2 pg (ref 26.0–34.0)
MCHC: 32.8 g/dL (ref 30.0–36.0)
MCV: 98.1 fL (ref 80.0–100.0)
Monocytes Absolute: 0.6 10*3/uL (ref 0.1–1.0)
Monocytes Relative: 10 %
Neutro Abs: 3.7 10*3/uL (ref 1.7–7.7)
Neutrophils Relative %: 60 %
Platelet Count: 156 10*3/uL (ref 150–400)
RBC: 4.13 MIL/uL — ABNORMAL LOW (ref 4.22–5.81)
RDW: 15.5 % (ref 11.5–15.5)
WBC Count: 6.2 10*3/uL (ref 4.0–10.5)
nRBC: 0 % (ref 0.0–0.2)

## 2020-08-21 LAB — CMP (CANCER CENTER ONLY)
ALT: 22 U/L (ref 0–44)
AST: 20 U/L (ref 15–41)
Albumin: 3.9 g/dL (ref 3.5–5.0)
Alkaline Phosphatase: 98 U/L (ref 38–126)
Anion gap: 6 (ref 5–15)
BUN: 17 mg/dL (ref 8–23)
CO2: 25 mmol/L (ref 22–32)
Calcium: 9.4 mg/dL (ref 8.9–10.3)
Chloride: 108 mmol/L (ref 98–111)
Creatinine: 1.25 mg/dL — ABNORMAL HIGH (ref 0.61–1.24)
GFR, Estimated: >60 mL/min (ref >60)
Glucose, Bld: 101 mg/dL — ABNORMAL HIGH (ref 70–99)
Potassium: 4.8 mmol/L (ref 3.5–5.1)
Sodium: 139 mmol/L (ref 135–145)
Total Bilirubin: 0.7 mg/dL (ref 0.3–1.2)
Total Protein: 7.4 g/dL (ref 6.5–8.1)

## 2020-08-21 MED ORDER — IOHEXOL 300 MG/ML  SOLN
100.0000 mL | Freq: Once | INTRAMUSCULAR | Status: AC | PRN
  Administered 2020-08-21: 100 mL via INTRAVENOUS

## 2020-08-25 ENCOUNTER — Telehealth: Payer: Self-pay | Admitting: Internal Medicine

## 2020-08-25 ENCOUNTER — Inpatient Hospital Stay: Payer: Medicare Other | Admitting: Internal Medicine

## 2020-08-25 ENCOUNTER — Other Ambulatory Visit: Payer: Self-pay

## 2020-08-25 VITALS — BP 136/65 | HR 60 | Temp 97.8°F | Resp 16 | Ht 67.0 in | Wt 174.8 lb

## 2020-08-25 DIAGNOSIS — C3492 Malignant neoplasm of unspecified part of left bronchus or lung: Secondary | ICD-10-CM | POA: Diagnosis not present

## 2020-08-25 DIAGNOSIS — Z8616 Personal history of COVID-19: Secondary | ICD-10-CM | POA: Diagnosis not present

## 2020-08-25 DIAGNOSIS — K219 Gastro-esophageal reflux disease without esophagitis: Secondary | ICD-10-CM | POA: Diagnosis not present

## 2020-08-25 DIAGNOSIS — Z7982 Long term (current) use of aspirin: Secondary | ICD-10-CM | POA: Diagnosis not present

## 2020-08-25 DIAGNOSIS — I10 Essential (primary) hypertension: Secondary | ICD-10-CM | POA: Diagnosis not present

## 2020-08-25 DIAGNOSIS — Z79899 Other long term (current) drug therapy: Secondary | ICD-10-CM | POA: Diagnosis not present

## 2020-08-25 DIAGNOSIS — I4891 Unspecified atrial fibrillation: Secondary | ICD-10-CM | POA: Diagnosis not present

## 2020-08-25 DIAGNOSIS — Z85820 Personal history of malignant melanoma of skin: Secondary | ICD-10-CM | POA: Diagnosis not present

## 2020-08-25 DIAGNOSIS — C3432 Malignant neoplasm of lower lobe, left bronchus or lung: Secondary | ICD-10-CM | POA: Diagnosis not present

## 2020-08-25 DIAGNOSIS — C7951 Secondary malignant neoplasm of bone: Secondary | ICD-10-CM | POA: Diagnosis not present

## 2020-08-25 DIAGNOSIS — G4730 Sleep apnea, unspecified: Secondary | ICD-10-CM | POA: Diagnosis not present

## 2020-08-25 DIAGNOSIS — Z5111 Encounter for antineoplastic chemotherapy: Secondary | ICD-10-CM

## 2020-08-25 DIAGNOSIS — Z8673 Personal history of transient ischemic attack (TIA), and cerebral infarction without residual deficits: Secondary | ICD-10-CM | POA: Diagnosis not present

## 2020-08-25 DIAGNOSIS — Z86718 Personal history of other venous thrombosis and embolism: Secondary | ICD-10-CM | POA: Diagnosis not present

## 2020-08-25 NOTE — Progress Notes (Signed)
[place] [phone]   Fax:([phone]  OFFICE PROGRESS NOTE  Aura Dials, MD [place] [address]  DIAGNOSIS: Stage IV (T3, N0, M1c) non-small cell lung cancer, adenocarcinoma presented with cavitary left lower lobe lung mass in addition to other pulmonary nodules and scattered hypermetabolic osseous metastatic disease involving the spine, ribs and bony pelvis as well as left proximal humeral pathologic fracture.   Molecular Biomarkers performed by [employer]TERATION(S) / [name]      [id number]  Afatinib, Dacomitinib, Erlotinib, Gefitinib, Osimertinib, Ramucirumab Neratinib  FGFR1Amplification  Erdafitinib, Lenvatinib, Nintedanib, Pazopanib, Pemigatinib, Ponatinib  [id number] None  PRIOR THERAPY: None  CURRENT THERAPY:  1) Palliative radiotherapy to the the left humerus under the care of Dr. Sondra Come. Last treatment expected on 04/24/2019 2) Targeted therapy with Tagrisso 80 p.o. daily.  He started the first dose on April 21, 2019.  Status post 16 months of treatment.  INTERVAL HISTORY: Albert Perez 70 y.o. male returns to the clinic today for follow-up visit.  The patient was diagnosed with COVID-19 in January 2022 and he was not vaccinated.  He is feeling much better now he denied having any current chest pain, shortness of breath, cough or hemoptysis.  He denied having any nausea, vomiting, diarrhea or constipation.  He has no skin rash.  He has no headache or visual changes.  He continues to tolerate his treatment with Tagrisso fairly well.  He had repeat CT scan of the chest performed recently and is here for evaluation and discussion of his discuss results.  MEDICAL HISTORY: Past Medical History:  Diagnosis Date  . A-fib [facility])    PMH: 2000-2001  . Arthritis   . Cancer ([place])    melanoma 2018  . DVT (deep venous thrombosis) (HCC)    Left brachial vein DVT (01/30/19, following  fracture)  . ETOH abuse   . GERD (gastroesophageal reflux disease)   . Lung cancer ([place]) dx'd 02/26/2019  . Metastatic cancer to bone ([place]) dx'd 02/26/2019  . Sleep apnea    do not wear CPAP  . Stroke [facility])    " mini"  . Vertigo   . Wears glasses     ALLERGIES:  has No Known Allergies.  MEDICATIONS:  Current Outpatient Medications  Medication Sig Dispense Refill  . acetaminophen (TYLENOL) 650 MG CR tablet Take 1,300 mg by mouth every 8 (eight) hours as needed for pain.    Marland Kitchen aspirin 81 MG tablet Take 1 tablet (81 mg total) by mouth daily.    . carvedilol (COREG) 6.25 MG tablet TAKE 1 TABLET BY MOUTH TWICE A DAY 180 tablet 0  . Cyanocobalamin (B-12 PO) Take 1,000 mg by mouth daily.     Marland Kitchen loperamide (IMODIUM) 2 MG capsule Take by mouth as needed for diarrhea or loose stools.    . meloxicam (MOBIC) 15 MG tablet Take 15 mg by mouth daily.    . Multiple Vitamin (MULTIVITAMIN PO) Take by mouth daily. Centrum    . Omega-3 Fatty Acids (OMEGA-3 PO) Take 1,200 mg by mouth daily.     Marland Kitchen osimertinib mesylate (TAGRISSO) 80 MG tablet Take 1 tablet (80 mg total) by mouth daily. Escribed to [facility] for  AZ and Me under prescription savings program.effective 07/30/20-07/04/21 All refills can be escribed to [facility].. 30 tablet 2  . rosuvastatin (CRESTOR) 5 MG tablet Take 5 mg by mouth daily.  2   No current facility-administered medications for  this visit.    SURGICAL HISTORY:  Past Surgical History:  Procedure Laterality Date  . ANKLE FRACTURE SURGERY Right 2003  . COLONOSCOPY  2018  . ELBOW SURGERY Left 1964  . melanoma removal  04/2017   Lt shoulder  . RETINAL DETACHMENT SURGERY Right 1971  . TONSILLECTOMY    . VIDEO BRONCHOSCOPY WITH ENDOBRONCHIAL NAVIGATION N/A 03/28/2019   Procedure: VIDEO BRONCHOSCOPY WITH ENDOBRONCHIAL NAVIGATION and Biopsies;  Surgeon: Collene Gobble, MD;  Location: [facility] OR;  Service: Thoracic;  Laterality: N/A;    REVIEW OF SYSTEMS:  Constitutional: positive for  fatigue Eyes: negative Ears, nose, mouth, throat, and face: negative Respiratory: negative Cardiovascular: negative Gastrointestinal: negative Genitourinary:negative Integument/breast: negative Hematologic/lymphatic: negative Musculoskeletal:negative Neurological: negative Behavioral/Psych: negative Endocrine: negative Allergic/Immunologic: negative   PHYSICAL EXAMINATION: General appearance: alert, cooperative and no distress Head: Normocephalic, without obvious abnormality, atraumatic Neck: no adenopathy, no JVD, supple, symmetrical, trachea midline and thyroid not enlarged, symmetric, no tenderness/mass/nodules Lymph nodes: Cervical, supraclavicular, and axillary nodes normal. Resp: clear to auscultation bilaterally Back: symmetric, no curvature. ROM normal. No CVA tenderness. Cardio: regular rate and rhythm, S1, S2 normal, no murmur, click, rub or gallop GI: soft, non-tender; bowel sounds normal; no masses,  no organomegaly Extremities: extremities normal, atraumatic, no cyanosis or edema Neurologic: Alert and oriented X 3, normal strength and tone. Normal symmetric reflexes. Normal coordination and gait   ECOG PERFORMANCE STATUS: 1 - Symptomatic but completely ambulatory  Blood pressure 136/65, pulse 60, temperature 97.8 F (36.6 C), temperature source Tympanic, resp. rate 16, height 5' 7" (1.702 m), weight 174 lb 12.8 oz (79.3 kg), SpO2 100 %.  LABORATORY DATA: Lab Results  Component Value Date   WBC 6.2 08/21/2020   HGB 13.3 08/21/2020   HCT 40.5 08/21/2020   MCV 98.1 08/21/2020   PLT 156 08/21/2020      Chemistry      Component Value Date/Time   NA 139 08/21/2020 1151   K 4.8 08/21/2020 1151   CL 108 08/21/2020 1151   CO2 25 08/21/2020 1151   BUN 17 08/21/2020 1151   CREATININE 1.25 (H) 08/21/2020 1151      Component Value Date/Time   CALCIUM 9.4 08/21/2020 1151   ALKPHOS 98 08/21/2020 1151   AST 20 08/21/2020 1151   ALT 22 08/21/2020 1151   BILITOT 0.7  08/21/2020 1151       RADIOGRAPHIC STUDIES: CT Chest W Contrast  Result Date: 08/21/2020 CLINICAL DATA:  Restaging non-small cell lung cancer EXAM: CT CHEST, ABDOMEN, AND PELVIS WITH CONTRAST TECHNIQUE: Multidetector CT imaging of the chest, abdomen and pelvis was performed following the standard protocol during bolus administration of intravenous contrast. CONTRAST:  130m OMNIPAQUE IOHEXOL 300 MG/ML  SOLN COMPARISON:  04/15/2020 FINDINGS: CT CHEST FINDINGS Cardiovascular: Coronary, aortic arch, and branch vessel atherosclerotic vascular disease. Mediastinum/Nodes: Prevascular node 0.7 cm in short axis on image 23 series 2, stable. No overtly pathologic adenopathy identified. Lungs/Pleura: Peripheral reticulonodular opacities in the lung apices. Hazy sub solid densities in the periphery of both lungs are difficult to measure due to indistinct margins but increased from prior. I am skeptical that these hazy opacities represent malignancy, they appear more typical for inflammatory or atypical infectious process. New irregular nodularity laterally in the right upper lobe measuring 1.3 by 0.6 cm on image 67 series 4, most likely inflammatory but meriting surveillance. A peribronchovascular nodule in the right middle lobe measures 2.0 by 1.0 cm on image 83 series 4, previously by my measurements this was  the same. Calcified granuloma in the right middle lobe on image 88 series 4, stable. Anterior right lower lobe nodule 0.6 by 0.5 cm on image 115 series 4, previously the same by my measurements. Anterior left upper lobe nodule 0.6 by 0.5 cm on image 77 of series 4 which is stable by my measurements. Stable scarring in the left lower lobe. Mild nodularity in the left lower lobe along the diaphragm measuring 0.9 by 0.6 cm on image 124 series 4, stable. Bandlike anterior left lower lobe density along the major fissure measuring up to 1.7 cm in thickness on image 68 series 4, previously the same by my measurements.  Morphologically unchanged. Musculoskeletal: Stable sclerotic lesions in the thoracic spine. CT ABDOMEN PELVIS FINDINGS Hepatobiliary: Contracted gallbladder.  Otherwise unremarkable. Pancreas: Unremarkable Spleen: Unremarkable Adrenals/Urinary Tract: Fluid density 2.0 cm cyst of the right kidney upper pole. This appears unchanged from priors. Urinary bladder unremarkable. Adrenal glands appear normal. Stomach/Bowel: Air-level in the rectum, diarrheal process not excluded. Otherwise unremarkable. Vascular/Lymphatic: Aortoiliac atherosclerotic vascular disease. 0.9 cm left periaortic lymph node on image 70 of series 2, previously 1.0 cm. Smaller retroperitoneal/periaortic lymph nodes are also present. Reproductive: Unremarkable Other: No supplemental non-categorized findings. Musculoskeletal: A small indirect left inguinal hernia contains a margin of the sigmoid colon. Also there appears to be a small direct right inguinal hernia which contains adipose tissue. Stable sclerotic lesions in the lumbar spine and in the bony pelvis. Grade 1 degenerative anterolisthesis of L5 on S1 with lumbar spondylosis and degenerative disc disease resulting in mild multilevel impingement. IMPRESSION: 1. Progressive sub solid densities in the periphery of both lungs, with a new irregular nodularity laterally in the right upper lobe measuring 1.3 by 0.6 cm, suspicious for inflammatory or atypical infectious process. 2. Previous small bilateral pulmonary nodules are stable. 3. Stable sclerotic lesions in the thoracic and lumbar spine and bony pelvis, suspicious for osseous metastatic disease. 4. Other imaging findings of potential clinical significance: Coronary atherosclerosis. Air-level in the rectum, diarrheal process not excluded. Small indirect left inguinal hernia contains a margin of the sigmoid colon. Small direct right inguinal hernia which contains adipose tissue. Grade 1 degenerative anterolisthesis of L5 on S1 with lumbar  spondylosis and degenerative disc disease resulting in mild multilevel impingement. 5. Aortic atherosclerosis. Aortic Atherosclerosis (ICD10-I70.0). Electronically Signed   By: Van Clines M.D.   On: 08/21/2020 16:28   CT Abdomen Pelvis W Contrast  Result Date: 08/21/2020 CLINICAL DATA:  Restaging non-small cell lung cancer EXAM: CT CHEST, ABDOMEN, AND PELVIS WITH CONTRAST TECHNIQUE: Multidetector CT imaging of the chest, abdomen and pelvis was performed following the standard protocol during bolus administration of intravenous contrast. CONTRAST:  159m OMNIPAQUE IOHEXOL 300 MG/ML  SOLN COMPARISON:  04/15/2020 FINDINGS: CT CHEST FINDINGS Cardiovascular: Coronary, aortic arch, and branch vessel atherosclerotic vascular disease. Mediastinum/Nodes: Prevascular node 0.7 cm in short axis on image 23 series 2, stable. No overtly pathologic adenopathy identified. Lungs/Pleura: Peripheral reticulonodular opacities in the lung apices. Hazy sub solid densities in the periphery of both lungs are difficult to measure due to indistinct margins but increased from prior. I am skeptical that these hazy opacities represent malignancy, they appear more typical for inflammatory or atypical infectious process. New irregular nodularity laterally in the right upper lobe measuring 1.3 by 0.6 cm on image 67 series 4, most likely inflammatory but meriting surveillance. A peribronchovascular nodule in the right middle lobe measures 2.0 by 1.0 cm on image 83 series 4, previously by my measurements this  was the same. Calcified granuloma in the right middle lobe on image 88 series 4, stable. Anterior right lower lobe nodule 0.6 by 0.5 cm on image 115 series 4, previously the same by my measurements. Anterior left upper lobe nodule 0.6 by 0.5 cm on image 77 of series 4 which is stable by my measurements. Stable scarring in the left lower lobe. Mild nodularity in the left lower lobe along the diaphragm measuring 0.9 by 0.6 cm on image  124 series 4, stable. Bandlike anterior left lower lobe density along the major fissure measuring up to 1.7 cm in thickness on image 68 series 4, previously the same by my measurements. Morphologically unchanged. Musculoskeletal: Stable sclerotic lesions in the thoracic spine. CT ABDOMEN PELVIS FINDINGS Hepatobiliary: Contracted gallbladder.  Otherwise unremarkable. Pancreas: Unremarkable Spleen: Unremarkable Adrenals/Urinary Tract: Fluid density 2.0 cm cyst of the right kidney upper pole. This appears unchanged from priors. Urinary bladder unremarkable. Adrenal glands appear normal. Stomach/Bowel: Air-level in the rectum, diarrheal process not excluded. Otherwise unremarkable. Vascular/Lymphatic: Aortoiliac atherosclerotic vascular disease. 0.9 cm left periaortic lymph node on image 70 of series 2, previously 1.0 cm. Smaller retroperitoneal/periaortic lymph nodes are also present. Reproductive: Unremarkable Other: No supplemental non-categorized findings. Musculoskeletal: A small indirect left inguinal hernia contains a margin of the sigmoid colon. Also there appears to be a small direct right inguinal hernia which contains adipose tissue. Stable sclerotic lesions in the lumbar spine and in the bony pelvis. Grade 1 degenerative anterolisthesis of L5 on S1 with lumbar spondylosis and degenerative disc disease resulting in mild multilevel impingement. IMPRESSION: 1. Progressive sub solid densities in the periphery of both lungs, with a new irregular nodularity laterally in the right upper lobe measuring 1.3 by 0.6 cm, suspicious for inflammatory or atypical infectious process. 2. Previous small bilateral pulmonary nodules are stable. 3. Stable sclerotic lesions in the thoracic and lumbar spine and bony pelvis, suspicious for osseous metastatic disease. 4. Other imaging findings of potential clinical significance: Coronary atherosclerosis. Air-level in the rectum, diarrheal process not excluded. Small indirect left  inguinal hernia contains a margin of the sigmoid colon. Small direct right inguinal hernia which contains adipose tissue. Grade 1 degenerative anterolisthesis of L5 on S1 with lumbar spondylosis and degenerative disc disease resulting in mild multilevel impingement. 5. Aortic atherosclerosis. Aortic Atherosclerosis (ICD10-I70.0). Electronically Signed   By: Van Clines M.D.   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  He is status post palliative radiotherapy to the metastatic disease in the left arm. The patient is currently undergoing treatment with target therapy with Tagrisso 80 mg p.o. daily started 16 months ago. The patient continues to tolerate his treatment with Tagrisso fairly well. He had repeat CT scan of the chest, abdomen pelvis performed recently.  I personally and independently reviewed the scan images and discussed the results with the patient today. His scan showed no clear evidence for disease progression in the chest but he has progressive subsolid density in the periphery of both lungs and in new irregular nodularity laterally in the right upper lobe suspicious for inflammatory or atypical infectious process which could be  secondary to his recent Covid infection. I recommended for the patient to continue his current treatment with Tagrisso with the same dose for now. I will see him back for follow-up visit in 6 weeks with repeat blood work. I also strongly encouraged the patient to consider COVID-19 vaccination. He was advised to call immediately if he has  any concerning symptoms in the interval. The patient voices understanding of current disease status and treatment options and is in agreement with the current care plan. All questions were answered. The patient knows to call the clinic with any problems, questions or concerns. We can certainly see the patient much sooner if necessary.   Disclaimer: This note was dictated with voice recognition software. Similar sounding words can inadvertently be transcribed and may not be corrected upon review.

## 2020-08-25 NOTE — Telephone Encounter (Signed)
Scheduled appointments per 2/21 los. Spoke to patient who is aware of appointments date and times. Gave patient calendar print out.

## 2020-08-26 DIAGNOSIS — K4090 Unilateral inguinal hernia, without obstruction or gangrene, not specified as recurrent: Secondary | ICD-10-CM | POA: Diagnosis not present

## 2020-09-23 ENCOUNTER — Other Ambulatory Visit: Payer: Self-pay | Admitting: Surgery

## 2020-09-23 DIAGNOSIS — K4020 Bilateral inguinal hernia, without obstruction or gangrene, not specified as recurrent: Secondary | ICD-10-CM | POA: Diagnosis not present

## 2020-09-26 ENCOUNTER — Other Ambulatory Visit: Payer: Self-pay | Admitting: Surgery

## 2020-10-08 ENCOUNTER — Other Ambulatory Visit: Payer: Self-pay

## 2020-10-08 ENCOUNTER — Inpatient Hospital Stay: Payer: Medicare Other | Attending: Internal Medicine

## 2020-10-08 ENCOUNTER — Other Ambulatory Visit: Payer: Self-pay | Admitting: Cardiology

## 2020-10-08 ENCOUNTER — Inpatient Hospital Stay: Payer: Medicare Other | Admitting: Internal Medicine

## 2020-10-08 VITALS — BP 139/60 | HR 54 | Temp 96.2°F | Resp 19 | Ht 67.0 in | Wt 173.6 lb

## 2020-10-08 DIAGNOSIS — C3492 Malignant neoplasm of unspecified part of left bronchus or lung: Secondary | ICD-10-CM

## 2020-10-08 DIAGNOSIS — I10 Essential (primary) hypertension: Secondary | ICD-10-CM | POA: Diagnosis not present

## 2020-10-08 DIAGNOSIS — C3490 Malignant neoplasm of unspecified part of unspecified bronchus or lung: Secondary | ICD-10-CM

## 2020-10-08 DIAGNOSIS — Z5111 Encounter for antineoplastic chemotherapy: Secondary | ICD-10-CM | POA: Diagnosis not present

## 2020-10-08 DIAGNOSIS — I4891 Unspecified atrial fibrillation: Secondary | ICD-10-CM | POA: Diagnosis not present

## 2020-10-08 DIAGNOSIS — C3432 Malignant neoplasm of lower lobe, left bronchus or lung: Secondary | ICD-10-CM | POA: Diagnosis not present

## 2020-10-08 DIAGNOSIS — Z86718 Personal history of other venous thrombosis and embolism: Secondary | ICD-10-CM | POA: Insufficient documentation

## 2020-10-08 DIAGNOSIS — C7951 Secondary malignant neoplasm of bone: Secondary | ICD-10-CM | POA: Insufficient documentation

## 2020-10-08 LAB — CBC WITH DIFFERENTIAL (CANCER CENTER ONLY)
Abs Immature Granulocytes: 0.01 10*3/uL (ref 0.00–0.07)
Basophils Absolute: 0 10*3/uL (ref 0.0–0.1)
Basophils Relative: 1 %
Eosinophils Absolute: 0.1 10*3/uL (ref 0.0–0.5)
Eosinophils Relative: 2 %
HCT: 37.5 % — ABNORMAL LOW (ref 39.0–52.0)
Hemoglobin: 12.8 g/dL — ABNORMAL LOW (ref 13.0–17.0)
Immature Granulocytes: 0 %
Lymphocytes Relative: 25 %
Lymphs Abs: 1.5 10*3/uL (ref 0.7–4.0)
MCH: 33.7 pg (ref 26.0–34.0)
MCHC: 34.1 g/dL (ref 30.0–36.0)
MCV: 98.7 fL (ref 80.0–100.0)
Monocytes Absolute: 0.6 10*3/uL (ref 0.1–1.0)
Monocytes Relative: 10 %
Neutro Abs: 3.7 10*3/uL (ref 1.7–7.7)
Neutrophils Relative %: 62 %
Platelet Count: 147 10*3/uL — ABNORMAL LOW (ref 150–400)
RBC: 3.8 MIL/uL — ABNORMAL LOW (ref 4.22–5.81)
RDW: 14.3 % (ref 11.5–15.5)
WBC Count: 5.9 10*3/uL (ref 4.0–10.5)
nRBC: 0 % (ref 0.0–0.2)

## 2020-10-08 LAB — CMP (CANCER CENTER ONLY)
ALT: 27 U/L (ref 0–44)
AST: 22 U/L (ref 15–41)
Albumin: 3.8 g/dL (ref 3.5–5.0)
Alkaline Phosphatase: 91 U/L (ref 38–126)
Anion gap: 8 (ref 5–15)
BUN: 17 mg/dL (ref 8–23)
CO2: 25 mmol/L (ref 22–32)
Calcium: 8.9 mg/dL (ref 8.9–10.3)
Chloride: 107 mmol/L (ref 98–111)
Creatinine: 1.35 mg/dL — ABNORMAL HIGH (ref 0.61–1.24)
GFR, Estimated: 56 mL/min — ABNORMAL LOW (ref >60)
Glucose, Bld: 108 mg/dL — ABNORMAL HIGH (ref 70–99)
Potassium: 4.9 mmol/L (ref 3.5–5.1)
Sodium: 140 mmol/L (ref 135–145)
Total Bilirubin: 0.6 mg/dL (ref 0.3–1.2)
Total Protein: 6.7 g/dL (ref 6.5–8.1)

## 2020-10-08 NOTE — Progress Notes (Signed)
[place] [phone]   Fax:(336) [phone]  OFFICE PROGRESS NOTE  Aura Dials, MD [place] [address]  DIAGNOSIS: Stage IV (T3, N0, M1c) non-small cell lung cancer, adenocarcinoma presented with cavitary left lower lobe lung mass in addition to other pulmonary nodules and scattered hypermetabolic osseous metastatic disease involving the spine, ribs and bony pelvis as well as left proximal humeral pathologic fracture.   Molecular Biomarkers performed by [employer]TER[employer](S) / [name]      [id number]  Afatinib, Dacomitinib, Erlotinib, Gefitinib, Osimertinib, Ramucirumab Neratinib  FGFR1Amplification  Erdafitinib, Lenvatinib, Nintedanib, Pazopanib, Pemigatinib, Ponatinib  [id number] None  PRIOR THERAPY: None  CURRENT THERAPY:  1) Palliative radiotherapy to the the left humerus under the care of Dr. Sondra Come. Last treatment expected on 04/24/2019 2) Targeted therapy with Tagrisso 80 p.o. daily.  He started the first dose on April 21, 2019.  Status post 18 months of treatment.  INTERVAL HISTORY: Albert Perez 70 y.o. male returns to the clinic today for follow-up visit.  The patient is feeling fine today with no concerning complaints.  He is scheduled for hernia repair on Nov 12, 2020.  He denied having any current chest pain, shortness of breath except with exertion with no cough or hemoptysis.  He has no nausea, vomiting, diarrhea or constipation.  He has no skin rash.  He continues to tolerate his treatment with Tagrisso fairly well.  The patient is here today for evaluation and repeat blood work.  MEDICAL HISTORY: Past Medical History:  Diagnosis Date  . A-fib [facility])    PMH: 2000-2001  . Arthritis   . Cancer ([place])    melanoma 2018  . DVT (deep venous thrombosis) (HCC)    Left brachial vein DVT (01/30/19, following fracture)  . ETOH abuse   . GERD (gastroesophageal reflux disease)   .  Lung cancer ([place]) dx'd 02/26/2019  . Metastatic cancer to bone ([place]) dx'd 02/26/2019  . Sleep apnea    do not wear CPAP  . Stroke [facility])    " mini"  . Vertigo   . Wears glasses     ALLERGIES:  has No Known Allergies.  MEDICATIONS:  Current Outpatient Medications  Medication Sig Dispense Refill  . acetaminophen (TYLENOL) 650 MG CR tablet Take 1,300 mg by mouth every 8 (eight) hours as needed for pain.    Marland Kitchen aspirin 81 MG tablet Take 1 tablet (81 mg total) by mouth daily.    . carvedilol (COREG) 6.25 MG tablet TAKE 1 TABLET BY MOUTH TWICE A DAY 180 tablet 3  . Cyanocobalamin (B-12 PO) Take 1,000 mg by mouth daily.     Marland Kitchen loperamide (IMODIUM) 2 MG capsule Take by mouth as needed for diarrhea or loose stools.    . meloxicam (MOBIC) 15 MG tablet Take 15 mg by mouth daily.    . Multiple Vitamin (MULTIVITAMIN PO) Take by mouth daily. Centrum    . Omega-3 Fatty Acids (OMEGA-3 PO) Take 1,200 mg by mouth daily.     Marland Kitchen osimertinib mesylate (TAGRISSO) 80 MG tablet Take 1 tablet (80 mg total) by mouth daily. Escribed to [facility] for  AZ and Me under prescription savings program.effective 07/30/20-07/04/21 All refills can be escribed to [facility].. 30 tablet 2  . rosuvastatin (CRESTOR) 5 MG tablet Take 5 mg by mouth daily.  2   No current facility-administered medications for this visit.    SURGICAL HISTORY:  Past Surgical History:  Procedure  Laterality Date  . ANKLE FRACTURE SURGERY Right 2003  . COLONOSCOPY  2018  . ELBOW SURGERY Left 1964  . melanoma removal  04/2017   Lt shoulder  . RETINAL DETACHMENT SURGERY Right 1971  . TONSILLECTOMY    . VIDEO BRONCHOSCOPY WITH ENDOBRONCHIAL NAVIGATION N/A 03/28/2019   Procedure: VIDEO BRONCHOSCOPY WITH ENDOBRONCHIAL NAVIGATION and Biopsies;  Surgeon: Collene Gobble, MD;  Location: [facility] OR;  Service: Thoracic;  Laterality: N/A;    REVIEW OF SYSTEMS:  A comprehensive review of systems was negative except for: Constitutional: positive for  fatigue Respiratory: positive for dyspnea on exertion   PHYSICAL EXAMINATION: General appearance: alert, cooperative and no distress Head: Normocephalic, without obvious abnormality, atraumatic Neck: no adenopathy, no JVD, supple, symmetrical, trachea midline and thyroid not enlarged, symmetric, no tenderness/mass/nodules Lymph nodes: Cervical, supraclavicular, and axillary nodes normal. Resp: clear to auscultation bilaterally Back: symmetric, no curvature. ROM normal. No CVA tenderness. Cardio: regular rate and rhythm, S1, S2 normal, no murmur, click, rub or gallop GI: soft, non-tender; bowel sounds normal; no masses,  no organomegaly Extremities: extremities normal, atraumatic, no cyanosis or edema   ECOG PERFORMANCE STATUS: 1 - Symptomatic but completely ambulatory  Blood pressure 139/60, pulse (!) 54, temperature (!) 96.2 F (35.7 C), temperature source Tympanic, resp. rate 19, height $RemoveBe'5\' 7"'GiyNExSOY$  (1.702 m), weight 173 lb 9.6 oz (78.7 kg), SpO2 100 %.  LABORATORY DATA: Lab Results  Component Value Date   WBC 5.9 10/08/2020   HGB 12.8 (L) 10/08/2020   HCT 37.5 (L) 10/08/2020   MCV 98.7 10/08/2020   PLT 147 (L) 10/08/2020      Chemistry      Component Value Date/Time   NA 140 10/08/2020 1042   K 4.9 10/08/2020 1042   CL 107 10/08/2020 1042   CO2 25 10/08/2020 1042   BUN 17 10/08/2020 1042   CREATININE 1.35 (H) 10/08/2020 1042      Component Value Date/Time   CALCIUM 8.9 10/08/2020 1042   ALKPHOS 91 10/08/2020 1042   AST 22 10/08/2020 1042   ALT 27 10/08/2020 1042   BILITOT 0.6 10/08/2020 1042       RADIOGRAPHIC STUDIES: No results found.  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  He is status post palliative radiotherapy to the metastatic disease in the left arm. The patient is currently undergoing treatment with target therapy with Tagrisso 80 mg p.o. daily started 18 months ago. The patient continues to tolerate this treatment fairly well with no concerning complaints. I recommended for him to continue his current treatment with Tagrisso with the same dose. I will see him back for follow-up visit in 1 months with repeat CT scan of the chest, abdomen pelvis for restaging of his disease. He was advised to call immediately if he has any concerning symptoms in the interval. The patient voices understanding of current disease status and treatment options and is in agreement with the current care plan. All questions were answered. The patient knows to call the clinic with any problems, questions or concerns. We can certainly see the patient much sooner if necessary.   Disclaimer: This note was dictated  with voice recognition software. Similar sounding words can inadvertently be transcribed and may not be corrected upon review.

## 2020-10-10 ENCOUNTER — Telehealth: Payer: Self-pay | Admitting: Internal Medicine

## 2020-10-10 NOTE — Telephone Encounter (Signed)
Scheduled per los. Called and spoke with patient. Confirmed appt 

## 2020-10-25 DIAGNOSIS — I10 Essential (primary) hypertension: Secondary | ICD-10-CM | POA: Diagnosis not present

## 2020-10-25 DIAGNOSIS — G8929 Other chronic pain: Secondary | ICD-10-CM | POA: Diagnosis not present

## 2020-10-25 DIAGNOSIS — M170 Bilateral primary osteoarthritis of knee: Secondary | ICD-10-CM | POA: Diagnosis not present

## 2020-10-25 DIAGNOSIS — C3432 Malignant neoplasm of lower lobe, left bronchus or lung: Secondary | ICD-10-CM | POA: Diagnosis not present

## 2020-10-25 DIAGNOSIS — E782 Mixed hyperlipidemia: Secondary | ICD-10-CM | POA: Diagnosis not present

## 2020-10-28 ENCOUNTER — Other Ambulatory Visit: Payer: Self-pay

## 2020-10-28 DIAGNOSIS — C3492 Malignant neoplasm of unspecified part of left bronchus or lung: Secondary | ICD-10-CM

## 2020-10-28 MED ORDER — OSIMERTINIB MESYLATE 80 MG PO TABS: 80.0000 mg | ORAL_TABLET | Freq: Every day | ORAL | 2 refills | Status: DC

## 2020-10-29 ENCOUNTER — Telehealth: Payer: Self-pay

## 2020-10-29 NOTE — Telephone Encounter (Signed)
   [facility] Pre-operative Risk Assessment    Patient Name: Albert Perez  DOB: [birth year]  MRN:   Request for surgical clearance:  1. What type of surgery is being performed Bilateral open Inquinal Hernia Repair  2. When is this surgery scheduled TBD  3. What type of clearance is required (medical clearance vs. Pharmacy clearance to hold med vs. Both)  Both  4. Are there any medications that need to be held prior to surgery and how long  Aspirin  5. Practice name and name of physician performing surgery [facility]  Dr.Douglas Ninfa Linden   6. What is the office phone number  [phone]   7.   What is the office fax number [phone]  8.   Anesthesia type  General   Kathyrn Lass 10/29/2020, 3:34 PM  _________________________________________________________________   (provider comments below)

## 2020-10-30 NOTE — Telephone Encounter (Signed)
   Primary Cardiologist: Quay Burow, MD  Chart reviewed as part of pre-operative protocol coverage. Given past medical history and time since last visit, based on ACC/AHA guidelines, Albert Perez would be at acceptable risk for the planned procedure without further cardiovascular testing.   Patient's aspirin is prescribed by neurology. He will need to receive recommendations on holding aspirin from his neurologist.  Patient was advised that if he develops new symptoms prior to surgery to contact our office to arrange a follow-up appointment.  He verbalized understanding.  I will route this recommendation to the requesting party via Epic fax function and remove from pre-op pool.  Please call with questions.  Jossie Ng. Veda Arrellano NP-C    10/30/2020, 9:40 AM [address] Office [phone] Fax [phone]

## 2020-11-03 ENCOUNTER — Other Ambulatory Visit: Payer: Self-pay | Admitting: Pharmacist

## 2020-11-03 ENCOUNTER — Other Ambulatory Visit: Payer: Self-pay | Admitting: *Deleted

## 2020-11-03 DIAGNOSIS — C3492 Malignant neoplasm of unspecified part of left bronchus or lung: Secondary | ICD-10-CM

## 2020-11-03 MED ORDER — OSIMERTINIB MESYLATE 80 MG PO TABS: 80.0000 mg | ORAL_TABLET | Freq: Every day | ORAL | 2 refills | Status: DC

## 2020-11-07 ENCOUNTER — Inpatient Hospital Stay: Payer: Medicare Other

## 2020-11-08 IMAGING — MR MR HEAD WO/W CM
5 of 11 series · 28 of 48 positions shown · IV contrast (gadavist)
Comparison: 09/02/2017

CLINICAL DATA: Stage IV non-small cell lung cancer left lower lobe.

EXAM:
MRI HEAD WITHOUT AND WITH CONTRAST
TECHNIQUE: Multiplanar, multiecho pulse sequences of the brain and surrounding
structures were obtained without and with intravenous contrast.
CONTRAST:  8mL GADAVIST GADOBUTROL 1 MMOL/ML IV SOLN

[Series 3: DWI · axial · 3.0mm · 1.09mm/px · z∈[-59,+99]mm · 9 of 108 slices shown (1 of 2)]
[im 1/108]
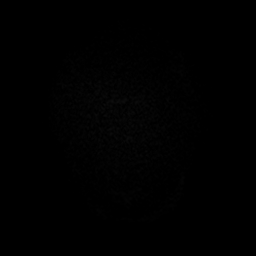
[im 14/108]
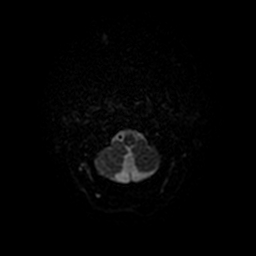
[im 27/108]
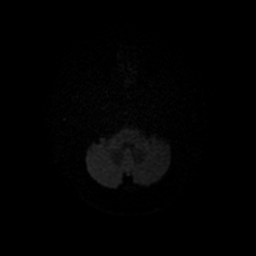
[im 41/108]
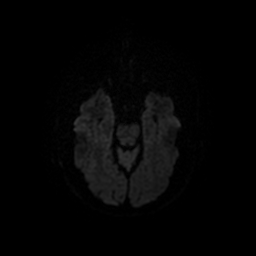
[im 54/108]
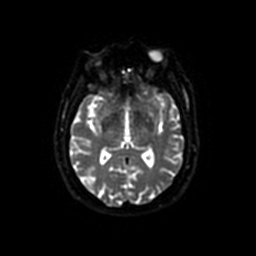
[im 67/108]
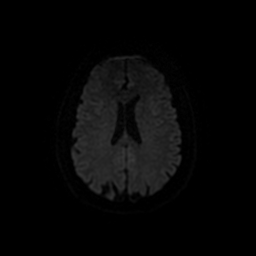
[im 81/108]
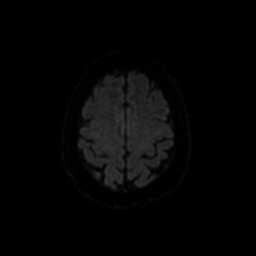
[im 94/108]
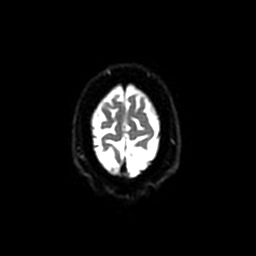
[im 108/108]
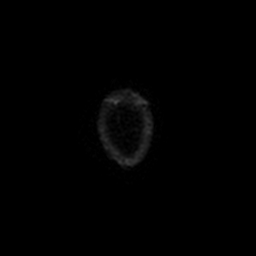

[Series 6: FLAIR · axial · 3.0mm · 0.43mm/px · z∈[-48,+113]mm · 2 of 28 slices shown]
[im 1/28]
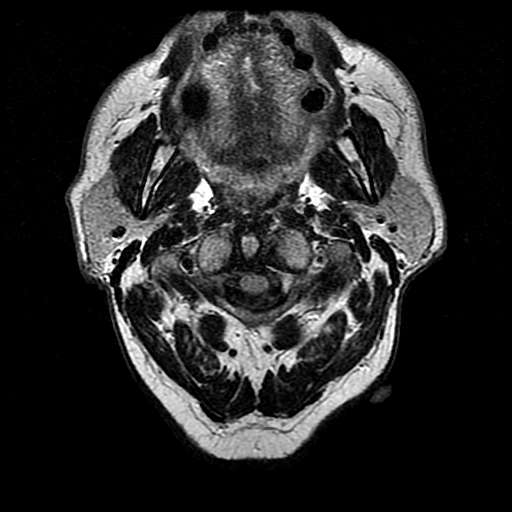
[im 28/28]
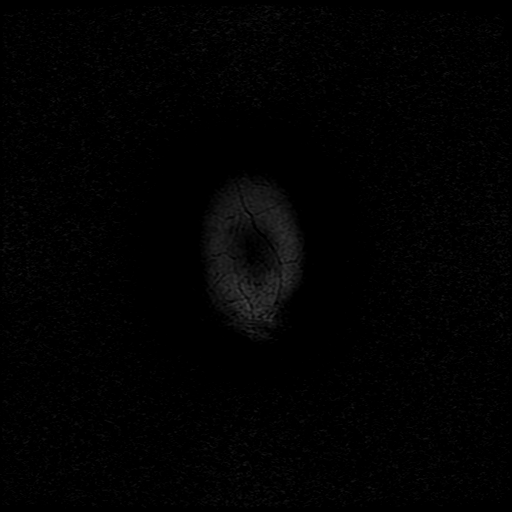

[Series 10: T1 post-contrast · axial · 1.0mm · 0.47mm/px · z∈[-38,+109]mm · 11 of 150 slices shown (1 of 2)]
[im 1/150]
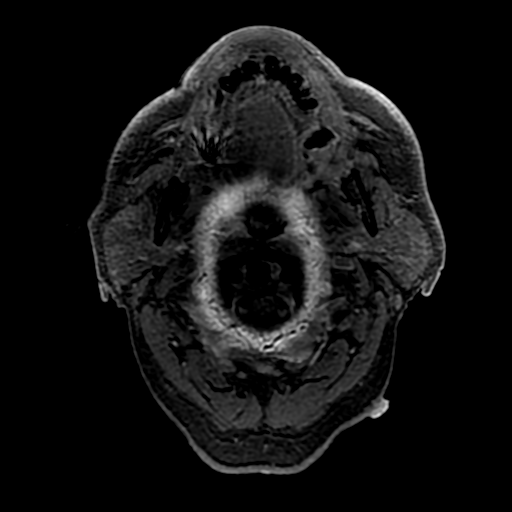
[im 15/150]
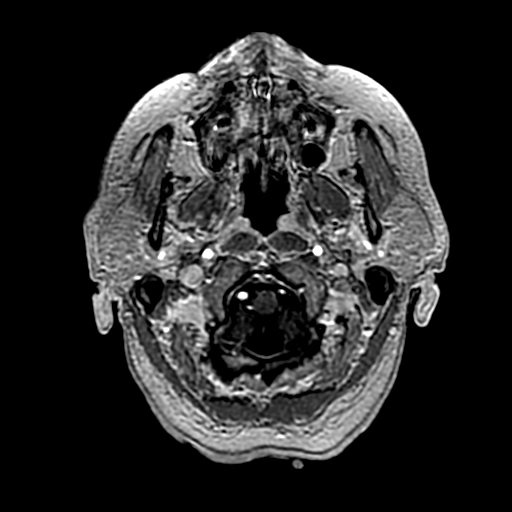
[im 30/150]
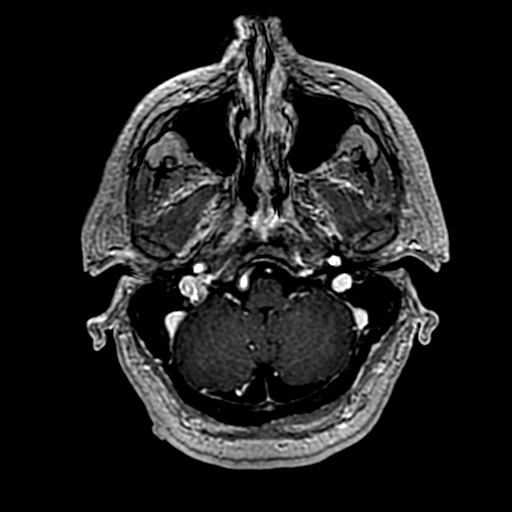
[im 45/150]
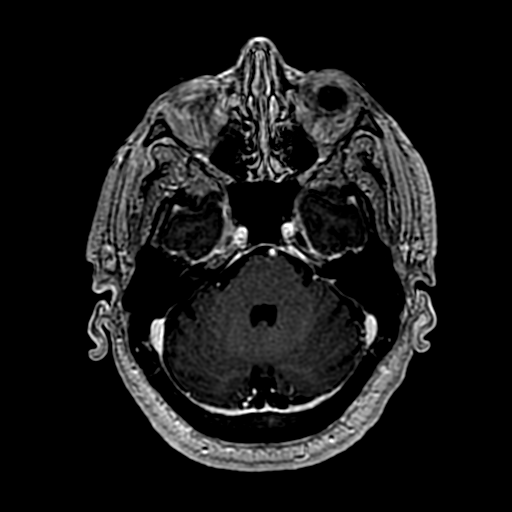
[im 60/150]
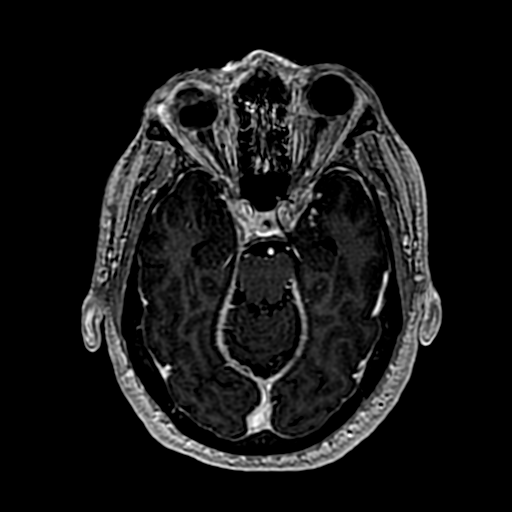
[im 75/150]
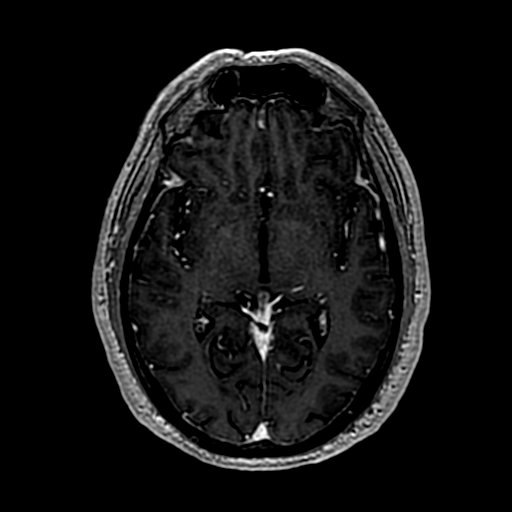
[im 90/150]
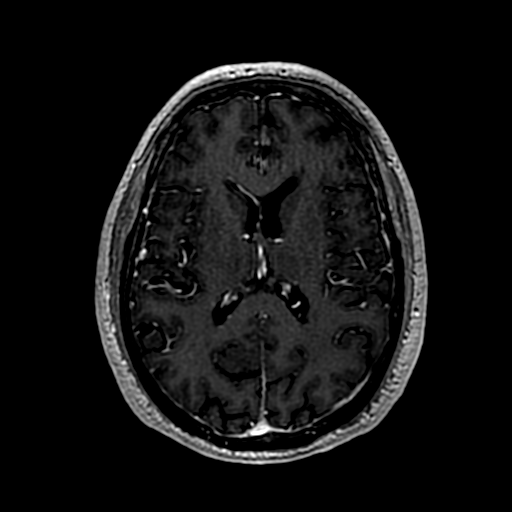
[im 105/150]
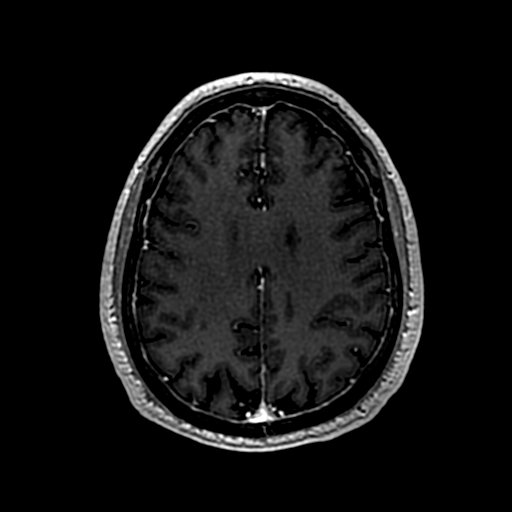
[im 120/150]
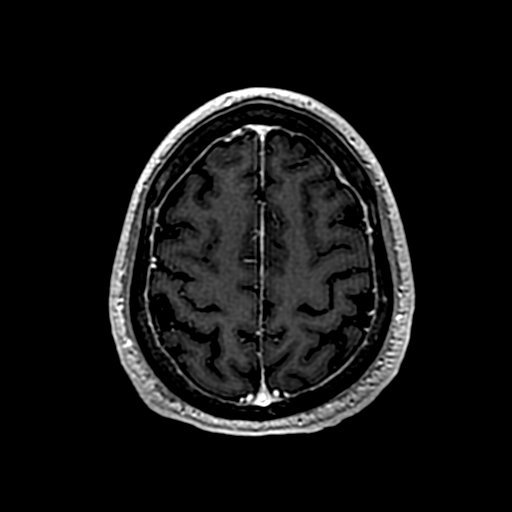
[im 135/150]
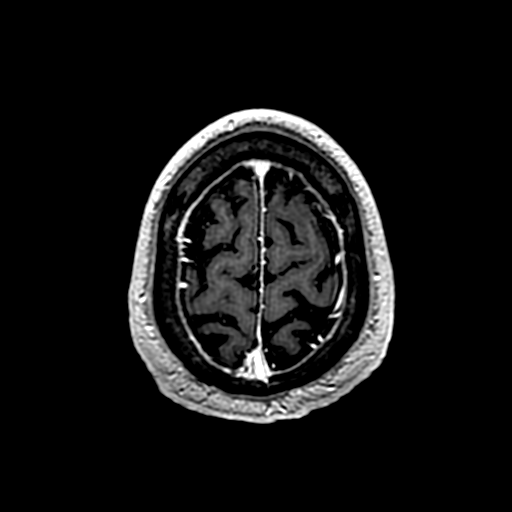
[im 150/150]
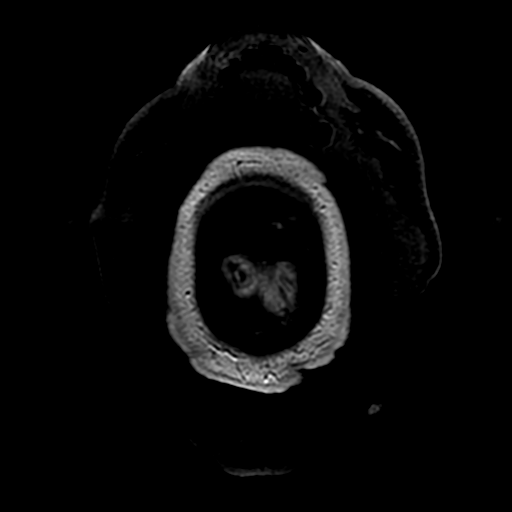

[Series 11: T1 post-contrast · coronal · 5.0mm · 0.45mm/px · 2 of 26 slices shown (2 of 2)]
[im 1/26]
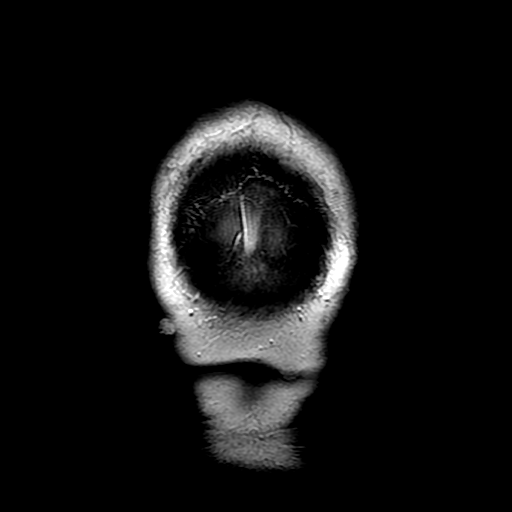
[im 26/26]
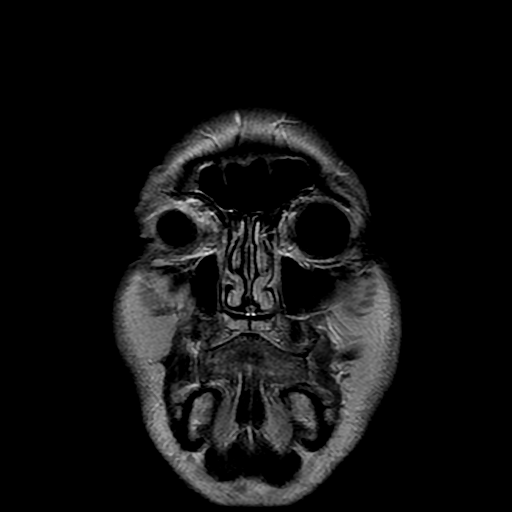

[Series 300: DWI · axial · 3.0mm · 1.09mm/px · z∈[-59,+99]mm · 4 of 54 slices shown (2 of 2)]
[im 1/54]
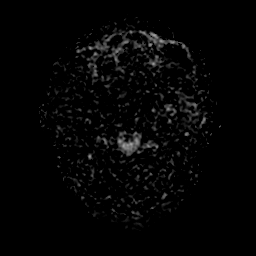
[im 18/54]
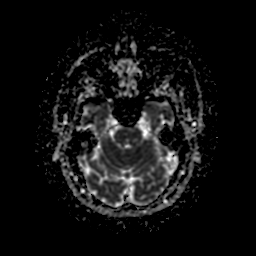
[im 36/54]
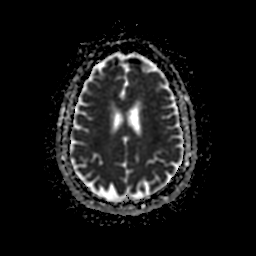
[im 54/54]
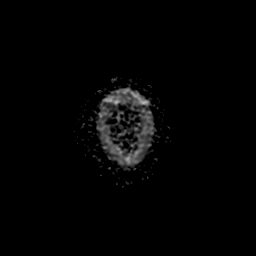

[28 of 48 positions shown; findings below may reference images not displayed]

FINDINGS: Brain: Diffusion imaging does not show any acute or subacute
infarction or other cause of restricted diffusion. The brainstem and
cerebellum are normal. Old small vessel infarction left thalamus.
Mild chronic small-vessel change of the cerebral hemispheric white
matter. No cortical or large vessel territory infarction. No sign of
primary or metastatic mass lesion, hemorrhage, hydrocephalus or
extra-axial collection.

Vascular: Major vessels at the base of the brain show flow.

Skull and upper cervical spine: Negative

Sinuses/Orbits: Sinuses are clear. Chronic deformity of the right
globe.

Other: None
IMPRESSION: No evidence of metastatic disease. Chronic small-vessel ischemic
changes as outlined above.

## 2020-11-10 ENCOUNTER — Other Ambulatory Visit (HOSPITAL_COMMUNITY)
Admission: RE | Admit: 2020-11-10 | Discharge: 2020-11-10 | Disposition: A | Discharge status: Home / Self Care | Payer: Medicare Other | Source: Ambulatory Visit | Attending: Surgery | Admitting: Surgery

## 2020-11-10 DIAGNOSIS — Z01812 Encounter for preprocedural laboratory examination: Secondary | ICD-10-CM | POA: Insufficient documentation

## 2020-11-10 DIAGNOSIS — Z20822 Contact with and (suspected) exposure to covid-19: Secondary | ICD-10-CM | POA: Diagnosis not present

## 2020-11-11 ENCOUNTER — Other Ambulatory Visit: Payer: Self-pay

## 2020-11-11 ENCOUNTER — Encounter (HOSPITAL_COMMUNITY): Payer: Self-pay | Admitting: Surgery

## 2020-11-11 LAB — SARS CORONAVIRUS 2 (TAT 6-24 HRS): SARS Coronavirus 2: NEGATIVE

## 2020-11-11 NOTE — Progress Notes (Signed)
Mr. Albert Perez denies chest pain or shortness of breath. Patient was tested for Covid and has been in quarantine since that time.

## 2020-11-11 NOTE — H&P (Signed)
Albert Perez  Location: [facility] Patient #: [id number] DOB: 12-28-1950 Married / Language: English / Race: White Male   History of Present Illness  The patient is a 70 year old male who presents with an inguinal hernia.  Chief complaint: Inguinal hernia  This is a 70 year old gentleman with stage IV non-small cell lung cancer who was referred here for evaluation of inguinal hernia. The patient says he has a large left inguinal hernia which causes minimal discomfort. He has had no obstructive symptoms. He is currently on hold chemotherapy. He had coded earlier this year. He has sleep apnea but does not use his CPAP. He has a history of SVT and PAF in the past. He is interested in having his hernia fixed. He currently denies any shortness of breath is not on oxygen. He reports that his performance status is fairly decent.   Past Surgical History  Foot Surgery  Right. Sentinel Lymph Node Biopsy   Diagnostic Studies History Colonoscopy  1-5 years ago  Allergies ( No Known Drug Allergies   Medication History ([name], CMA; buPROPion HCl ER (XL) (150MG  Tablet ER 24HR, Oral) Active. Carvedilol (6.25MG  Tablet, Oral) Active. Rosuvastatin Calcium (5MG  Tablet, Oral) Active. Aspirin (81MG  Tablet, Oral) Active. Vitamin C (1000MG  Tablet, Oral) Active. Vitamin B-12 (1000MCG Tablet, Oral) Active. Multivitamin (Oral) Active. Medications Reconciled  Social History [name], CMA Alcohol use  Moderate alcohol use. Tobacco use  Current some day smoker.  Family History ([name], [place];  Alcohol Abuse  Father. Arthritis  Father. Cerebrovascular Accident  Mother. Rectal Cancer  Brother.  Other Problems [name], [place]; Arthritis  Cerebrovascular Accident  Heart murmur  Hypercholesterolemia     Review of Systems ([name] CMA General Present- Fatigue. Not Present- Appetite Loss, Chills, Fever, Night Sweats,  Weight Gain and Weight Loss. Skin Not Present- Change in Wart/Mole, Dryness, Hives, Jaundice, New Lesions, Non-Healing Wounds, Rash and Ulcer. HEENT Present- Ringing in the Ears and Wears glasses/contact lenses. Not Present- Earache, Hearing Loss, Hoarseness, Nose Bleed, Oral Ulcers, Seasonal Allergies, Sinus Pain, Sore Throat, Visual Disturbances and Yellow Eyes. Respiratory Present- Snoring. Not Present- Bloody sputum, Chronic Cough, Difficulty Breathing and Wheezing. Gastrointestinal Present- Change in Bowel Habits. Not Present- Abdominal Pain, Bloating, Bloody Stool, Chronic diarrhea, Constipation, Difficulty Swallowing, Excessive gas, Gets full quickly at meals, Hemorrhoids, Indigestion, Nausea, Rectal Pain and Vomiting. Male Genitourinary Present- Change in Urinary Stream. Not Present- Blood in Urine, Frequency, Impotence, Nocturia, Painful Urination, Urgency and Urine Leakage. Musculoskeletal Present- Joint Pain and Joint Stiffness. Not Present- Back Pain, Muscle Pain, Muscle Weakness and Swelling of Extremities. Neurological Present- Tingling. Not Present- Decreased Memory, Fainting, Headaches, Numbness, Seizures, Tremor, Trouble walking and Weakness.  Vitals ([name] CMA;   Weight: 175 lb Height: 68in Body Surface Area: 1.93 m Body Mass Index: 26.61 kg/m  Temp.: 98.45F  Pulse: 69 (Regular)  P.OX: 100% (Room air) BP: 130/98(Sitting, Left Arm, Standard)    Physical Exam The physical exam findings are as follows: Note: He appears well on exam  His abdomen is soft and nontender. He actually has bilateral, easily reducible inguinal hernias with left being larger than the right. There is no evidence of umbilical hernia.  Breathing appears normal Lungs clear CV stable Skin without rash    Assessment & Plan   BILATERAL INGUINAL HERNIA (K40.20)  Impression: I reviewed his notes in the electronic medical records. Again, he has bilateral inguinal hernias. I  discussed abdominal wall anatomy with him in detail. We  discussed the reasoning for repairing hernias to prevent incarceration or bowel strangulation. He is interested in hernia repair. Normally, I would recommend this be performed laparoscopic given the bilateral nature. Given his pulmonary and cardiac status, however, he may not tolerate deep general anesthesia and I would need to consider doing an open surgery and stented with a tap block and LMA. We will get cardiac clearance and then determine whether or not we can proceed with surgery and how deep of anesthesia we can use. I will call back after we have received clearance.

## 2020-11-12 ENCOUNTER — Ambulatory Visit (HOSPITAL_COMMUNITY): Payer: Medicare Other | Admitting: Anesthesiology

## 2020-11-12 ENCOUNTER — Encounter (HOSPITAL_COMMUNITY): Payer: Self-pay | Admitting: Surgery

## 2020-11-12 ENCOUNTER — Encounter (HOSPITAL_COMMUNITY)
Admission: RE | Disposition: A | Discharge status: Home / Self Care | Payer: Self-pay | Source: Home / Self Care | Attending: Surgery

## 2020-11-12 ENCOUNTER — Ambulatory Visit (HOSPITAL_COMMUNITY)
Admission: RE | Admit: 2020-11-12 | Discharge: 2020-11-12 | Disposition: A | Discharge status: Home / Self Care | Payer: Medicare Other | Attending: Surgery | Admitting: Surgery

## 2020-11-12 DIAGNOSIS — Z79899 Other long term (current) drug therapy: Secondary | ICD-10-CM | POA: Diagnosis not present

## 2020-11-12 DIAGNOSIS — I471 Supraventricular tachycardia: Secondary | ICD-10-CM | POA: Diagnosis not present

## 2020-11-12 DIAGNOSIS — I10 Essential (primary) hypertension: Secondary | ICD-10-CM | POA: Diagnosis not present

## 2020-11-12 DIAGNOSIS — Z8673 Personal history of transient ischemic attack (TIA), and cerebral infarction without residual deficits: Secondary | ICD-10-CM | POA: Diagnosis not present

## 2020-11-12 DIAGNOSIS — Z8261 Family history of arthritis: Secondary | ICD-10-CM | POA: Diagnosis not present

## 2020-11-12 DIAGNOSIS — K4020 Bilateral inguinal hernia, without obstruction or gangrene, not specified as recurrent: Secondary | ICD-10-CM | POA: Insufficient documentation

## 2020-11-12 DIAGNOSIS — G8918 Other acute postprocedural pain: Secondary | ICD-10-CM | POA: Diagnosis not present

## 2020-11-12 DIAGNOSIS — F17200 Nicotine dependence, unspecified, uncomplicated: Secondary | ICD-10-CM | POA: Diagnosis not present

## 2020-11-12 DIAGNOSIS — Z7982 Long term (current) use of aspirin: Secondary | ICD-10-CM | POA: Diagnosis not present

## 2020-11-12 DIAGNOSIS — Z800 Family history of malignant neoplasm of digestive organs: Secondary | ICD-10-CM | POA: Diagnosis not present

## 2020-11-12 DIAGNOSIS — C3490 Malignant neoplasm of unspecified part of unspecified bronchus or lung: Secondary | ICD-10-CM | POA: Diagnosis not present

## 2020-11-12 DIAGNOSIS — Z823 Family history of stroke: Secondary | ICD-10-CM | POA: Insufficient documentation

## 2020-11-12 DIAGNOSIS — Z9221 Personal history of antineoplastic chemotherapy: Secondary | ICD-10-CM | POA: Insufficient documentation

## 2020-11-12 DIAGNOSIS — I480 Paroxysmal atrial fibrillation: Secondary | ICD-10-CM | POA: Diagnosis not present

## 2020-11-12 HISTORY — DX: Cardiac arrhythmia, unspecified: I49.9

## 2020-11-12 HISTORY — PX: INGUINAL HERNIA REPAIR: SHX194

## 2020-11-12 HISTORY — DX: Diarrhea, unspecified: R19.7

## 2020-11-12 LAB — COMPREHENSIVE METABOLIC PANEL
ALT: 22 U/L (ref 0–44)
AST: 24 U/L (ref 15–41)
Albumin: 3.3 g/dL — ABNORMAL LOW (ref 3.5–5.0)
Alkaline Phosphatase: 70 U/L (ref 38–126)
Anion gap: 5 (ref 5–15)
BUN: 18 mg/dL (ref 8–23)
CO2: 23 mmol/L (ref 22–32)
Calcium: 8.7 mg/dL — ABNORMAL LOW (ref 8.9–10.3)
Chloride: 110 mmol/L (ref 98–111)
Creatinine, Ser: 1.18 mg/dL (ref 0.61–1.24)
GFR, Estimated: >60 mL/min (ref >60)
Glucose, Bld: 102 mg/dL — ABNORMAL HIGH (ref 70–99)
Potassium: 3.9 mmol/L (ref 3.5–5.1)
Sodium: 138 mmol/L (ref 135–145)
Total Bilirubin: 0.6 mg/dL (ref 0.3–1.2)
Total Protein: 6 g/dL — ABNORMAL LOW (ref 6.5–8.1)

## 2020-11-12 LAB — CBC
HCT: 40.7 % (ref 39.0–52.0)
Hemoglobin: 13.9 g/dL (ref 13.0–17.0)
MCH: 33.8 pg (ref 26.0–34.0)
MCHC: 34.2 g/dL (ref 30.0–36.0)
MCV: 99 fL (ref 80.0–100.0)
Platelets: 150 10*3/uL (ref 150–400)
RBC: 4.11 MIL/uL — ABNORMAL LOW (ref 4.22–5.81)
RDW: 13.3 % (ref 11.5–15.5)
WBC: 5.9 10*3/uL (ref 4.0–10.5)
nRBC: 0 % (ref 0.0–0.2)

## 2020-11-12 SURGERY — REPAIR, HERNIA, INGUINAL, BILATERAL, ADULT
Anesthesia: General | Laterality: Bilateral

## 2020-11-12 MED ORDER — ACETAMINOPHEN 500 MG PO TABS
1000.0000 mg | ORAL_TABLET | ORAL | Status: AC
  Administered 2020-11-12: 1000 mg via ORAL
  Filled 2020-11-12: qty 2

## 2020-11-12 MED ORDER — FENTANYL CITRATE (PF) 250 MCG/5ML IJ SOLN
INTRAMUSCULAR | Status: AC
  Filled 2020-11-12: qty 5

## 2020-11-12 MED ORDER — ONDANSETRON HCL 4 MG/2ML IJ SOLN
INTRAMUSCULAR | Status: AC
  Filled 2020-11-12: qty 2

## 2020-11-12 MED ORDER — PROPOFOL 10 MG/ML IV BOLUS
INTRAVENOUS | Status: DC | PRN
  Administered 2020-11-12: 150 mg via INTRAVENOUS

## 2020-11-12 MED ORDER — PROPOFOL 10 MG/ML IV BOLUS
INTRAVENOUS | Status: AC
  Filled 2020-11-12: qty 20

## 2020-11-12 MED ORDER — ENSURE PRE-SURGERY PO LIQD: 296.0000 mL | Freq: Once | ORAL | Status: DC

## 2020-11-12 MED ORDER — LIDOCAINE 2% (20 MG/ML) 5 ML SYRINGE
INTRAMUSCULAR | Status: AC
  Filled 2020-11-12: qty 5

## 2020-11-12 MED ORDER — DEXAMETHASONE SODIUM PHOSPHATE 10 MG/ML IJ SOLN
INTRAMUSCULAR | Status: DC | PRN
  Administered 2020-11-12: 10 mg via INTRAVENOUS

## 2020-11-12 MED ORDER — MIDAZOLAM HCL 2 MG/2ML IJ SOLN
INTRAMUSCULAR | Status: AC
  Administered 2020-11-12: 4 mg via INTRAVENOUS
  Filled 2020-11-12: qty 2

## 2020-11-12 MED ORDER — BUPIVACAINE-EPINEPHRINE (PF) 0.25% -1:200000 IJ SOLN
INTRAMUSCULAR | Status: DC | PRN
  Administered 2020-11-12: 10 mL via PERINEURAL

## 2020-11-12 MED ORDER — FENTANYL CITRATE (PF) 100 MCG/2ML IJ SOLN: 100.0000 ug | Freq: Once | INTRAMUSCULAR | Status: AC

## 2020-11-12 MED ORDER — ONDANSETRON HCL 4 MG/2ML IJ SOLN
INTRAMUSCULAR | Status: DC | PRN
  Administered 2020-11-12: 4 mg via INTRAVENOUS

## 2020-11-12 MED ORDER — EPHEDRINE 5 MG/ML INJ
INTRAVENOUS | Status: AC
  Filled 2020-11-12: qty 10

## 2020-11-12 MED ORDER — DEXAMETHASONE SODIUM PHOSPHATE 10 MG/ML IJ SOLN
INTRAMUSCULAR | Status: DC | PRN
  Administered 2020-11-12 (×2): 5 mg

## 2020-11-12 MED ORDER — MIDAZOLAM HCL 2 MG/2ML IJ SOLN: 4.0000 mg | Freq: Once | INTRAMUSCULAR | Status: AC

## 2020-11-12 MED ORDER — FENTANYL CITRATE (PF) 100 MCG/2ML IJ SOLN
INTRAMUSCULAR | Status: AC
  Administered 2020-11-12: 100 ug via INTRAVENOUS
  Filled 2020-11-12: qty 2

## 2020-11-12 MED ORDER — DEXAMETHASONE SODIUM PHOSPHATE 10 MG/ML IJ SOLN
INTRAMUSCULAR | Status: AC
  Filled 2020-11-12: qty 1

## 2020-11-12 MED ORDER — OXYCODONE HCL 5 MG PO TABS: 5.0000 mg | ORAL_TABLET | Freq: Four times a day (QID) | ORAL | 0 refills | Status: DC | PRN

## 2020-11-12 MED ORDER — FENTANYL CITRATE (PF) 100 MCG/2ML IJ SOLN
INTRAMUSCULAR | Status: DC | PRN
  Administered 2020-11-12 (×3): 50 ug via INTRAVENOUS

## 2020-11-12 MED ORDER — CHLORHEXIDINE GLUCONATE 0.12 % MT SOLN
15.0000 mL | Freq: Once | OROMUCOSAL | Status: AC
  Administered 2020-11-12: 15 mL via OROMUCOSAL
  Filled 2020-11-12: qty 15

## 2020-11-12 MED ORDER — CHLORHEXIDINE GLUCONATE CLOTH 2 % EX PADS: 6.0000 | MEDICATED_PAD | Freq: Once | CUTANEOUS | Status: DC

## 2020-11-12 MED ORDER — 0.9 % SODIUM CHLORIDE (POUR BTL) OPTIME
TOPICAL | Status: DC | PRN
  Administered 2020-11-12: 1000 mL

## 2020-11-12 MED ORDER — ORAL CARE MOUTH RINSE: 15.0000 mL | Freq: Once | OROMUCOSAL | Status: AC

## 2020-11-12 MED ORDER — BUPIVACAINE-EPINEPHRINE (PF) 0.25% -1:200000 IJ SOLN
INTRAMUSCULAR | Status: DC | PRN
  Administered 2020-11-12 (×2): 30 mL via PERINEURAL

## 2020-11-12 MED ORDER — LIDOCAINE 2% (20 MG/ML) 5 ML SYRINGE
INTRAMUSCULAR | Status: DC | PRN
  Administered 2020-11-12: 100 mg via INTRAVENOUS

## 2020-11-12 MED ORDER — MIDAZOLAM HCL 2 MG/2ML IJ SOLN
INTRAMUSCULAR | Status: AC
  Filled 2020-11-12: qty 2

## 2020-11-12 MED ORDER — CEFAZOLIN SODIUM-DEXTROSE 2-4 GM/100ML-% IV SOLN
2.0000 g | INTRAVENOUS | Status: AC
  Administered 2020-11-12: 2 g via INTRAVENOUS
  Filled 2020-11-12: qty 100

## 2020-11-12 MED ORDER — LACTATED RINGERS IV SOLN: INTRAVENOUS | Status: DC

## 2020-11-12 SURGICAL SUPPLY — 40 items
ADH SKN CLS APL DERMABOND .7 (GAUZE/BANDAGES/DRESSINGS) ×1
ADH SKN CLS LQ APL DERMABOND (GAUZE/BANDAGES/DRESSINGS) ×1
APL PRP STRL LF DISP 70% ISPRP (MISCELLANEOUS) ×1
BLADE CLIPPER SURG (BLADE) ×1 IMPLANT
CHLORAPREP W/TINT 26 (MISCELLANEOUS) ×2 IMPLANT
COVER SURGICAL LIGHT HANDLE (MISCELLANEOUS) ×2 IMPLANT
COVER WAND RF STERILE (DRAPES) IMPLANT
DERMABOND ADHESIVE PROPEN (GAUZE/BANDAGES/DRESSINGS) ×1
DERMABOND ADVANCED (GAUZE/BANDAGES/DRESSINGS) ×1
DERMABOND ADVANCED .7 DNX12 (GAUZE/BANDAGES/DRESSINGS) ×1 IMPLANT
DERMABOND ADVANCED .7 DNX6 (GAUZE/BANDAGES/DRESSINGS) IMPLANT
DRAIN PENROSE 1/2X12 LTX STRL (WOUND CARE) ×1 IMPLANT
DRAPE LAPAROTOMY TRNSV 102X78 (DRAPES) ×2 IMPLANT
DRSG TEGADERM 4X4.75 (GAUZE/BANDAGES/DRESSINGS) ×2 IMPLANT
ELECT REM PT RETURN 9FT ADLT (ELECTROSURGICAL) ×2
ELECTRODE REM PT RTRN 9FT ADLT (ELECTROSURGICAL) ×1 IMPLANT
GLOVE SURG SIGNA 7.5 PF LTX (GLOVE) ×2 IMPLANT
GOWN STRL REUS W/ TWL LRG LVL3 (GOWN DISPOSABLE) ×1 IMPLANT
GOWN STRL REUS W/ TWL XL LVL3 (GOWN DISPOSABLE) ×1 IMPLANT
GOWN STRL REUS W/TWL LRG LVL3 (GOWN DISPOSABLE) ×2
GOWN STRL REUS W/TWL XL LVL3 (GOWN DISPOSABLE) ×2
KIT BASIN OR (CUSTOM PROCEDURE TRAY) ×2 IMPLANT
KIT TURNOVER KIT B (KITS) ×2 IMPLANT
MESH PARIETEX PROGRIP LEFT (Mesh General) ×2 IMPLANT
MESH PARIETEX PROGRIP RIGHT (Mesh General) ×2 IMPLANT
NDL HYPO 25GX1X1/2 BEV (NEEDLE) ×1 IMPLANT
NEEDLE HYPO 25GX1X1/2 BEV (NEEDLE) ×2 IMPLANT
NS IRRIG 1000ML POUR BTL (IV SOLUTION) ×2 IMPLANT
PACK GENERAL/GYN (CUSTOM PROCEDURE TRAY) ×2 IMPLANT
PAD ARMBOARD 7.5X6 YLW CONV (MISCELLANEOUS) ×2 IMPLANT
PENCIL SMOKE EVACUATOR (MISCELLANEOUS) ×2 IMPLANT
SUT MON AB 4-0 PC3 18 (SUTURE) ×4 IMPLANT
SUT SILK 2 0 PERMA HAND 18 BK (SUTURE) ×2 IMPLANT
SUT VIC AB 2-0 CT1 27 (SUTURE) ×10
SUT VIC AB 2-0 CT1 TAPERPNT 27 (SUTURE) ×2 IMPLANT
SUT VIC AB 3-0 CT1 27 (SUTURE) ×10
SUT VIC AB 3-0 CT1 TAPERPNT 27 (SUTURE) ×2 IMPLANT
SYR CONTROL 10ML LL (SYRINGE) ×2 IMPLANT
TOWEL GREEN STERILE (TOWEL DISPOSABLE) ×2 IMPLANT
TOWEL GREEN STERILE FF (TOWEL DISPOSABLE) ×2 IMPLANT

## 2020-11-12 NOTE — Anesthesia Postprocedure Evaluation (Signed)
Anesthesia Post Note  Patient: Albert Perez  Procedure(s) Performed: BILATERAL OPEN INGUINAL HERNIA REPAIR WITH MESH (Bilateral )     Patient location during evaluation: PACU Anesthesia Type: General Level of consciousness: awake and alert Pain management: pain level controlled Vital Signs Assessment: post-procedure vital signs reviewed and stable Respiratory status: spontaneous breathing, nonlabored ventilation, respiratory function stable and patient connected to nasal cannula oxygen Cardiovascular status: blood pressure returned to baseline and stable Postop Assessment: no apparent nausea or vomiting Anesthetic complications: no   No complications documented.  Last Vitals:  Vitals:   11/12/20 1117 11/12/20 1134  BP: (!) 161/61 (!) 160/63  Pulse: (!) 47 (!) 44  Resp: 15 10  Temp:  36.7 C  SpO2: 94% 96%    Last Pain:  Vitals:   11/12/20 1134  TempSrc:   PainSc: 0-No pain                 Stein Windhorst

## 2020-11-12 NOTE — Anesthesia Preprocedure Evaluation (Addendum)
Anesthesia Evaluation  Patient identified by MRN, date of birth, ID band Patient awake    Reviewed: Allergy & Precautions, NPO status , Patient's Chart, lab work & pertinent test results  Airway Mallampati: II  TM Distance: >3 FB Neck ROM: Full    Dental  (+) Dental Advisory Given, Missing, Chipped, Poor Dentition,    Pulmonary neg pulmonary ROS, sleep apnea , Current Smoker and Patient abstained from smoking., former smoker,    Pulmonary exam normal breath sounds clear to auscultation       Cardiovascular hypertension, + CAD  negative cardio ROS Normal cardiovascular exam+ dysrhythmias Atrial Fibrillation  Rhythm:Regular Rate:Normal   Echo 11/16/17: Study Conclusions - Left ventricle: The cavity size was normal. Wall thickness was normal. Systolic function was normal. The estimated ejection fraction was in the range of 55% to 60%. Wall motion was normal; there were no regional wall motion abnormalities. Doppler parameters are consistent with abnormal left ventricular relaxation (grade 1 diastolic dysfunction). - Right atrium: The atrium was mildly dilated.     Neuro/Psych PSYCHIATRIC DISORDERS Carotid US 08/16/17: IMPRESSION: Minor carotid atherosclerosis. No hemodynamically significant ICA stenosis. Degree of narrowing less than 50% bilaterally by ultrasound criteria. Patent antegrade vertebral flow bilaterally. TIACVA, No Residual Symptoms negative neurological ROS  negative psych ROS   GI/Hepatic negative GI ROS, Neg liver ROS, GERD  Controlled,  Endo/Other  negative endocrine ROS  Renal/GU negative Renal ROS  negative genitourinary   Musculoskeletal negative musculoskeletal ROS (+) Arthritis ,   Abdominal   Peds negative pediatric ROS (+)  Hematology negative hematology ROS (+)   Anesthesia Other Findings   Reproductive/Obstetrics negative OB ROS                            Anesthesia Physical  Anesthesia Plan  ASA: III  Anesthesia Plan: General   Post-op Pain Management: GA combined w/ Regional for post-op pain   Induction: Intravenous  PONV Risk Score and Plan: 2 and Ondansetron, Dexamethasone and Treatment may vary due to age or medical condition  Airway Management Planned: Oral ETT  Additional Equipment: None  Intra-op Plan:   Post-operative Plan: Extubation in OR  Informed Consent: I have reviewed the patients History and Physical, chart, labs and discussed the procedure including the risks, benefits and alternatives for the proposed anesthesia with the patient or authorized representative who has indicated his/her understanding and acceptance.     Dental advisory given  Plan Discussed with: CRNA and Anesthesiologist  Anesthesia Plan Comments: (See PAT note written 03/27/2019 by Myra Gianotti, PA-C. SAME DAY WORK-UP. History melanoma. Recent former smoker. + ETOH 3-4/day. Remote afib ~ 2000 (SR in 2003)--no recent EKG, so EKG on arrival day of surgery. LUE DVT 01/30/19 following injury.    )       Anesthesia Quick Evaluation

## 2020-11-12 NOTE — Anesthesia Procedure Notes (Signed)
Procedure Name: LMA Insertion Date/Time: 11/12/2020 9:57 AM Performed by: Genelle Bal, CRNA Pre-anesthesia Checklist: Patient identified, Emergency Drugs available, Suction available and Patient being monitored Patient Re-evaluated:Patient Re-evaluated prior to induction Oxygen Delivery Method: Circle system utilized Preoxygenation: Pre-oxygenation with 100% oxygen Induction Type: IV induction Ventilation: Mask ventilation without difficulty LMA: LMA inserted LMA Size: 4.0 Number of attempts: 1 Airway Equipment and Method: Bite block Placement Confirmation: positive ETCO2 Tube secured with: Tape Dental Injury: Teeth and Oropharynx as per pre-operative assessment

## 2020-11-12 NOTE — Discharge Instructions (Signed)
[facility] _______[facility]  UMBILICAL OR INGUINAL HERNIA REPAIR: POST OP INSTRUCTIONS  Always review your discharge instruction sheet given to you by the facility where your surgery was performed. IF YOU HAVE DISABILITY OR FAMILY LEAVE FORMS, YOU MUST BRING THEM TO THE OFFICE FOR PROCESSING.   DO NOT GIVE THEM TO YOUR DOCTOR.  1. A  prescription for pain medication may be given to you upon discharge.  Take your pain medication as prescribed, if needed.  If narcotic pain medicine is not needed, then you may take acetaminophen (Tylenol) or ibuprofen (Advil) as needed. 2. Take your usually prescribed medications unless otherwise directed. If you need a refill on your pain medication, please contact your pharmacy.  They will contact our office to request authorization. Prescriptions will not be filled after 5 pm or on week-ends. 3. You should follow a light diet the first 24 hours after arrival home, such as soup and crackers, etc.  Be sure to include lots of fluids daily.  Resume your normal diet the day after surgery. 4.Most patients will experience some swelling and bruising around the umbilicus or in the groin and scrotum.  Ice packs and reclining will help.  Swelling and bruising can take several days to resolve.  6. It is common to experience some constipation if taking pain medication after surgery.  Increasing fluid intake and taking a stool softener (such as Colace) will usually help or prevent this problem from occurring.  A mild laxative (Milk of Magnesia or Miralax) should be taken according to package directions if there are no bowel movements after 48 hours. 7. Unless discharge instructions indicate otherwise, you may remove your bandages 24-48 hours after surgery, and you may shower at that time.  You may have steri-strips (small skin tapes) in place directly over the incision.  These strips should be left on the skin for 7-10 days.  If your surgeon used skin glue on the  incision, you may shower in 24 hours.  The glue will flake off over the next 2-3 weeks.  Any sutures or staples will be removed at the office during your follow-up visit. 8. ACTIVITIES:  You may resume regular (light) daily activities beginning the next day--such as daily self-care, walking, climbing stairs--gradually increasing activities as tolerated.  You may have sexual intercourse when it is comfortable.  Refrain from any heavy lifting or straining until approved by your doctor.  a.You may drive when you are no longer taking prescription pain medication, you can comfortably wear a seatbelt, and you can safely maneuver your car and apply brakes. b.RETURN TO WORK:   _____________________________________________  9.You should see your doctor in the office for a follow-up appointment approximately 2-3 weeks after your surgery.  Make sure that you call for this appointment within a day or two after you arrive home to insure a convenient appointment time. 10.OTHER INSTRUCTIONS: _OK TO SHOWER STARTING TOMORROW ICE PACK, TYLENOL, AND IBUPROFEN ALSO FOR PAIN NO LIFTING MORE THAN 15 POUNDS FOR 4 WEEKS________________________    _____________________________________  WHEN TO CALL YOUR DOCTOR: 1. Fever over 101.0 2. Inability to urinate 3. Nausea and/or vomiting 4. Extreme swelling or bruising 5. Continued bleeding from incision. 6. Increased pain, redness, or drainage from the incision  The clinic staff is available to answer your questions during regular business hours.  Please don't hesitate to call and ask to speak to one of the nurses for clinical concerns.  If you have a medical emergency, go to the nearest  emergency room or call 911.  A surgeon from [facility] is always on call at the hospital   [address] [place], [place], [place]  [address] ?  [address], [place], [place]   [address] [phone] ? [phone] ? FAX [phone] Web site:  [email or web address]

## 2020-11-12 NOTE — Transfer of Care (Signed)
Immediate Anesthesia Transfer of Care Note  Patient: Albert Perez  Procedure(s) Performed: BILATERAL OPEN INGUINAL HERNIA REPAIR WITH MESH (Bilateral )  Patient Location: PACU  Anesthesia Type:GA combined with regional for post-op pain  Level of Consciousness: awake, alert  and oriented  Airway & Oxygen Therapy: Patient Spontanous Breathing and Patient connected to face mask oxygen  Post-op Assessment: Report given to RN and Post -op Vital signs reviewed and stable  Post vital signs: Reviewed and stable  Last Vitals:  Vitals Value Taken Time  BP 167/63 11/12/20 1102  Temp 36.7 C 11/12/20 1102  Pulse 45 11/12/20 1102  Resp 12 11/12/20 1102  SpO2 100 % 11/12/20 1102  Vitals shown include unvalidated device data.  Last Pain:  Vitals:   11/12/20 0945  TempSrc:   PainSc: 0-No pain      Patients Stated Pain Goal: 2 (91/47/82 [id number])  Complications: No complications documented.

## 2020-11-12 NOTE — Interval H&P Note (Signed)
History and Physical Interval Note: no change in H and P  11/12/2020 9:38 AM  Albert Perez  has presented today for surgery, with the diagnosis of bilateral inguinal hernia.  The various methods of treatment have been discussed with the patient and family. After consideration of risks, benefits and other options for treatment, the patient has consented to  Procedure(s): BILATERAL OPEN INGUINAL HERNIA REPAIR WITH MESH (Bilateral) as a surgical intervention.  The patient's history has been reviewed, patient examined, no change in status, stable for surgery.  I have reviewed the patient's chart and labs.  Questions were answered to the patient's satisfaction.     Coralie Keens

## 2020-11-12 NOTE — Anesthesia Procedure Notes (Signed)
Anesthesia Regional Block: TAP block   Pre-Anesthetic Checklist: ,, timeout performed, Correct Patient, Correct Site, Correct Laterality, Correct Procedure, Correct Position, site marked, Risks and benefits discussed,  Surgical consent,  Pre-op evaluation,  At surgeon's request and post-op pain management  Laterality: Left and Right  Prep: chloraprep       Needles:  Injection technique: Single-shot  Needle Type: Echogenic Stimulator Needle     Needle Length: 5cm  Needle Gauge: 22     Additional Needles:   Procedures:, nerve stimulator,,, ultrasound used (permanent image in chart),,,,  Narrative:  Start time: 11/12/2020 9:20 AM End time: 11/12/2020 9:30 AM Injection made incrementally with aspirations every 5 mL.  Performed by: Personally  Anesthesiologist: Janeece Riggers, MD  Additional Notes: Functioning IV was confirmed and monitors were applied.  A 63mm 22ga Arrow echogenic stimulator needle was used. Sterile prep and drape,hand hygiene and sterile gloves were used. Ultrasound guidance: relevant anatomy identified, needle position confirmed, local anesthetic spread visualized around nerve(s)., vascular puncture avoided.  Image printed for medical record. Negative aspiration and negative test dose prior to incremental administration of local anesthetic. The patient tolerated the procedure well.  NO PICK UNABLE TO COMMUNICATE WITH SERVER

## 2020-11-12 NOTE — Op Note (Addendum)
BILATERAL OPEN INGUINAL HERNIA REPAIR WITH MESH  Procedure Note  VALERIO PINARD 11/12/2020   Pre-op Diagnosis: bilateral inguinal hernia     Post-op Diagnosis: same  Procedure(s): BILATERAL OPEN INGUINAL HERNIA REPAIR WITH MESH  Surgeon(s): Coralie Keens, MD  Anesthesia: General  Staff:  Circulator: Sadie Haber, RN Scrub Person: Deland Pretty, RN Wynelle Link, MD   Estimated Blood Loss: Minimal               Findings: Patient indirect left inguinal hernia which was repaired with a piece of large Prolene ProGrip mesh from [employer].  He also had a moderate sized right indirect inguinal hernia and a very small indirect right inguinal hernia.  The right side was also fixed with a large piece of Prolene ProGrip mesh  Procedure: The patient was identified in the Preoperative holding area tap block was performed bilaterally by anesthesiology with ultrasound guidance.  He was then taken to the operating room.  He was placed in supine position on the operating table general anesthesia was induced.  His abdomen was then prepped and draped in usual sterile fashion.  I anesthetized the skin of the inguinal area with Marcaine and made incision with a scalpel.  I took this down through Scarpa's fascia electrocautery.  External inspection.  There was 1 small external rings.  This testicular cord structures were controlled with a ring.  Patient had a moderate to large left indirect hernia sac.  I dissected the sac off of the cord structures and then down to the exit from the internal ring.  The contents of the specimen.This is within a silk suture.  I excised the redundant sac.  Next a piece of large Prolene ProGrip mesh approximate field.  I placed an instrument to the broader structures.  I then sutured in place with several 2-0 Vicryl sutures.  Wide coverage the inguinal floor injected with internal physical achieved.  I closed external oblique fascia over the top with mesh with a  running 2-0 Vicryl suture.  Scarpa's fascia was then closed with 3-0 Vicryl sutures and the skin was closed with running 4 Monocryl.  Dermabond was then applied.  Next anesthetized the skin in the right inguinal area with Marcaine.  I did make a longitudinal incision with a scalpel.  Dissection was then taken down through Scarpa's fascia electrocautery.  External bleak fascia internal and external ring.  On the right side, the patient had a direct hernia.  This was a very small indirect hernia.  The indirect hernia was very small and then reduced back through the internal ring and inguinal ring was repaired with a 2-0 silk suture.  The inguinal fold was then complicated with 2-0 Vicryl sutures and a few 2-0 silk sutures.  Next a large piece of right-sided Prolene ProGrip mesh was noted.  This placed against the pubic tubercle and sutured in place with Vicryl suture and then brought around the cord structures.  Wide coverage and angle for and ring achieved.  The external oblique fascia was then closed with running 2-0 Vicryl suture Scarpa's fascia was closed with interrupted 3-0 Vicryl sutures and the skin was closed with running 4-0 Monocryl.  Dermabond was applied to the patient tolerated the procedure well.  All counts were correct at the end of the procedure.  The patient was then extubated in the operating room and taken in a stable condition to the recovery room.          Coralie Keens  Date: 11/12/2020  Time: 10:54 AM

## 2020-11-13 ENCOUNTER — Encounter (HOSPITAL_COMMUNITY): Payer: Self-pay | Admitting: Surgery

## 2020-11-17 ENCOUNTER — Telehealth: Payer: Self-pay | Admitting: Medical Oncology

## 2020-11-17 NOTE — Telephone Encounter (Signed)
Moved CT out to 11/24/20 and f/u a few days after -schedule message sent.

## 2020-11-18 ENCOUNTER — Inpatient Hospital Stay: Payer: Medicare Other | Admitting: Internal Medicine

## 2020-11-18 ENCOUNTER — Inpatient Hospital Stay: Payer: Medicare Other

## 2020-11-18 ENCOUNTER — Ambulatory Visit (HOSPITAL_COMMUNITY): Payer: Medicare Other

## 2020-11-18 ENCOUNTER — Telehealth: Payer: Self-pay | Admitting: Internal Medicine

## 2020-11-18 NOTE — Telephone Encounter (Signed)
Scheduled appts per 5/16 sch msg. Pt aware.

## 2020-11-25 NOTE — Progress Notes (Signed)
Albert Perez OFFICE PROGRESS NOTE  Aura Dials, MD [place] [address]  DIAGNOSIS: Stage IV (T3, N0, M1c) non-small cell lung cancer, adenocarcinoma presented with cavitary left lower lobe lung mass in addition to other pulmonary nodules and scattered hypermetabolic osseous metastatic disease involving the spine, ribs and bony pelvis as well as left proximal humeral pathologic fracture.   Molecular Biomarkers performed by [employer]ECTED [employer]TERATION(S) / [name] [id number]  Afatinib, Dacomitinib, Erlotinib, Gefitinib, Osimertinib, Ramucirumab Neratinib  FGFR1Amplification  Erdafitinib, Lenvatinib, Nintedanib, Pazopanib, Pemigatinib, Ponatinib  [id number]  PRIOR THERAPY: Palliative radiotherapy to the the left humerus under the care of Dr. Sondra Come. Last treatment ~04/24/2019  CURRENT THERAPY: Targeted therapywith Tagrisso 80 p.o. daily.He started the first dose on April 21, 2019. Status post 19 months of treatment.   INTERVAL HISTORY: Albert Perez 70 y.o. male returns to the clinic for a follow up visit. The patient is feeling well today without any concerning complaints. In the interval since his last appointment, he has had elective hernia repair surgery and tolerated this well. He denies any calf pain or swelling. He stated after arm surgery in 2020, he developed a DVT and was on Xarelto. He had hemoptysis previously with Xarelto. He also follows with cardiology and is on Coreg BID. He took his his dose today as prescribed. He is bradycardic but is asymptomatic.   Regarding his treatment with Tagrisso, the patient continues to tolerate treatment with targeted treatment with Tagrisso well without any adverse effects. He does report ~2 episodes of diarrhea 2-3 times a week which he takes imodium. He sometimes has left arm pain where he had his prior left humerus fracture from his metastatic bone disease. He received  palliative radiation to this site. Denies any fever, chills, night sweats, or weight loss. Denies any chest pain, shortness of breath, cough, or hemoptysis. Denies any nausea, vomiting, or constipation. Denies any headache or visual changes. Denies any rashes or skin changes. The patient recently had a restaging CT scan performed. The patient is here today for evaluation and to review his scan results.      MEDICAL HISTORY: Past Medical History:  Diagnosis Date  . A-fib [facility])    PMH: 2000-2001  . Arthritis   . Cancer ([place])    melanoma 2018  . Diarrhea    due to medications  . DVT (deep venous thrombosis) ([facility])    Left brachial vein DVT (01/30/19, following fracture)  . Dysrhythmia    afib -   . ETOH abuse   . GERD (gastroesophageal reflux disease)    ocassional  . Lung cancer ([place]) dx'd 02/26/2019  . Metastatic cancer to bone ([place]) dx'd 02/26/2019  . Sleep apnea    do not wear CPAP  . Stroke [facility]) 2019   " mini"  . Vertigo   . Wears glasses     ALLERGIES:  has No Known Allergies.  MEDICATIONS:  Current Outpatient Medications  Medication Sig Dispense Refill  . apixaban (ELIQUIS) 5 MG TABS tablet Take 2 tablets (61m) twice daily for 7 days, then 1 tablet (512m twice daily 60 tablet 0  . acetaminophen (TYLENOL) 650 MG CR tablet Take 650 mg by mouth every 8 (eight) hours as needed for pain.    . Marland KitchenuPROPion (WELLBUTRIN XL) 150 MG 24 hr tablet Take 150 mg by mouth daily as needed (nicotine cravings).    . carvedilol (COREG) 6.25 MG tablet TAKE 1 TABLET BY MOUTH TWICE A DAY (  Patient taking differently: Take 6.25 mg by mouth 2 (two) times daily with a meal.) 180 tablet 3  . loperamide (IMODIUM) 2 MG capsule Take 2 mg by mouth as needed for diarrhea or loose stools.    . Multiple Vitamin (MULTIVITAMIN PO) Take 1 tablet by mouth daily. Centrum    . Omega-3 Fatty Acids (OMEGA-3 PO) Take 1 capsule by mouth daily.    Marland Kitchen osimertinib mesylate (TAGRISSO) 80 MG tablet Take 1 tablet (80 mg  total) by mouth daily. 30 tablet 2  . OVER THE COUNTER MEDICATION Take 1 tablet by mouth daily. Standard Process, LivaPlex (Liver supplement)    . oxyCODONE (OXY IR/ROXICODONE) 5 MG immediate release tablet Take 1 tablet (5 mg total) by mouth every 6 (six) hours as needed for moderate pain, severe pain or breakthrough pain. 30 tablet 0  . rosuvastatin (CRESTOR) 5 MG tablet Take 5 mg by mouth daily.  2  . TURMERIC PO Take 1 capsule by mouth daily.    . vitamin B-12 (CYANOCOBALAMIN) 1000 MCG tablet Take 1,000 mcg by mouth daily.     No current facility-administered medications for this visit.    SURGICAL HISTORY:  Past Surgical History:  Procedure Laterality Date  . ANKLE FRACTURE SURGERY Right 2003  . COLONOSCOPY  2018  . ELBOW SURGERY Left 1964  . INGUINAL HERNIA REPAIR Bilateral 11/12/2020   Procedure: BILATERAL OPEN INGUINAL HERNIA REPAIR WITH MESH;  Surgeon: Coralie Keens, MD;  Location: [facility];  Service: General;  Laterality: Bilateral;  . melanoma removal  04/2017   Lt shoulder  . RETINAL DETACHMENT SURGERY Right 1971  . TONSILLECTOMY    . VIDEO BRONCHOSCOPY WITH ENDOBRONCHIAL NAVIGATION N/A 03/28/2019   Procedure: VIDEO BRONCHOSCOPY WITH ENDOBRONCHIAL NAVIGATION and Biopsies;  Surgeon: Collene Gobble, MD;  Location: [facility] OR;  Service: Thoracic;  Laterality: N/A;    REVIEW OF SYSTEMS:   Review of Systems  Constitutional: Negative for appetite change, chills, fatigue, fever and unexpected weight change.  HENT: Negative for mouth sores, nosebleeds, sore throat and trouble swallowing.   Eyes: Negative for eye problems and icterus.  Respiratory: Negative for cough, hemoptysis, shortness of breath and wheezing.   Cardiovascular: Negative for chest pain and leg swelling.  Gastrointestinal: Positive for diarrhea. Negative for abdominal pain, constipation,  nausea and vomiting.  Genitourinary: Negative for bladder incontinence, difficulty urinating, dysuria, frequency and hematuria.    Musculoskeletal: Negative for back pain, gait problem, neck pain and neck stiffness.  Skin: Negative for itching and rash.  Neurological: Negative for dizziness, extremity weakness, gait problem, headaches, light-headedness and seizures.  Hematological: Negative for adenopathy. Does not bruise/bleed easily.  Psychiatric/Behavioral: Negative for confusion, depression and sleep disturbance. The patient is not nervous/anxious.     PHYSICAL EXAMINATION:  Blood pressure 139/62, pulse (!) 50, temperature (!) 97 F (36.1 C), temperature source Tympanic, resp. rate 20, height '5\' 8"'  (1.727 m), weight 172 lb 3.2 oz (78.1 kg), SpO2 100 %.  ECOG PERFORMANCE STATUS: 1 - Symptomatic but completely ambulatory  Physical Exam  Constitutional: Oriented to person, place, and time and well-developed, well-nourished, and in no distress.  HENT:  Head: Normocephalic and atraumatic.  Mouth/Throat: Oropharynx is clear and moist. No oropharyngeal exudate.  Eyes: Conjunctivae are normal. Right eye exhibits no discharge. Left eye exhibits no discharge. No scleral icterus.  Neck: Normal range of motion. Neck supple.  Cardiovascular: Normal rate, regular rhythm, normal heart sounds and intact distal pulses.   Pulmonary/Chest: Effort normal and breath sounds normal. No  respiratory distress. No wheezes. No rales.  Abdominal: Soft. Bowel sounds are normal. Exhibits no distension and no mass. There is no tenderness.  Musculoskeletal: Normal range of motion. Exhibits no edema.  Lymphadenopathy:    No cervical adenopathy.  Neurological: Alert and oriented to person, place, and time. Exhibits normal muscle tone. Gait normal. Coordination normal.  Skin: Skin is warm and dry. No rash noted. Not diaphoretic. No erythema. No pallor.  Psychiatric: Mood, memory and judgment normal.  Vitals reviewed.  LABORATORY DATA: Lab Results  Component Value Date   WBC 8.0 11/27/2020   HGB 13.2 11/27/2020   HCT 39.5 11/27/2020   MCV  98.0 11/27/2020   PLT 186 11/27/2020      Chemistry      Component Value Date/Time   NA 145 11/27/2020 1047   K 5.0 11/27/2020 1047   CL 107 11/27/2020 1047   CO2 27 11/27/2020 1047   BUN 18 11/27/2020 1047   CREATININE 1.41 (H) 11/27/2020 1047      Component Value Date/Time   CALCIUM 9.7 11/27/2020 1047   ALKPHOS 97 11/27/2020 1047   AST 19 11/27/2020 1047   ALT 19 11/27/2020 1047   BILITOT 0.6 11/27/2020 1047       RADIOGRAPHIC STUDIES:  CT Chest W Contrast  Result Date: 11/27/2020 CLINICAL DATA:  Primary Cancer Type: Lung Imaging Indication: Assess response to therapy Interval therapy since last imaging? Yes Initial Cancer Diagnosis Date: 03/28/2019; Established by: Biopsy-proven Detailed Pathology: Stage IV non-small cell lung cancer, adenocarcinoma. Primary Tumor location: Left lower lobe. Osseous metastatic disease. Surgeries: Bilateral open inguinal hernia repair with mesh 11/12/2020. Chemotherapy: Yes; Ongoing?  Yes; Tagrisso daily Immunotherapy? No Radiation therapy? Yes; Date Range: 04/11/2019 - 04/24/2019; Target: Left humerus EXAM: CT CHEST, ABDOMEN, AND PELVIS WITH CONTRAST TECHNIQUE: Multidetector CT imaging of the chest, abdomen and pelvis was performed following the standard protocol during bolus administration of intravenous contrast. CONTRAST:  12m OMNIPAQUE IOHEXOL 300 MG/ML SOLN, additional oral enteric contrast COMPARISON:  Most recent CT chest, abdomen and pelvis 08/21/2020. 04/04/2019 PET-CT. FINDINGS: CT CHEST FINDINGS Cardiovascular: Aortic atherosclerosis. Normal heart size. Three-vessel coronary artery calcifications. No pericardial effusion. Incidental note of saddle pulmonary embolus and additional bilateral lobar to segmental embolus, which is of relatively small volume despite very central location (series 2, image 28). Mediastinum/Nodes: No enlarged mediastinal, hilar, or axillary lymph nodes. Thyroid gland, trachea, and esophagus demonstrate no  significant findings. Lungs/Pleura: Unchanged post treatment appearance of a superior segment left lower lobe mass, measuring 3.2 x 1.8 cm (series 6, image 70). Previously seen sub solid opacities and ground-glass, most conspicuously in the peripheral right upper lobe, is almost completely resolved (series 6, image 68). There is new subpleural consolidation of the inferior right costophrenic recess (series 6, image 124). Unchanged bandlike scarring or atelectasis of the left lung base. Unchanged peribronchial nodule of the central right middle lobe measuring 1.8 x 1.1 cm (series 6, image 83). Occasional small pulmonary nodules are unchanged, for example a 4 mm nodule of the anterior left upper lobe (series 6, image 78). No pleural effusion or pneumothorax. Musculoskeletal: No chest wall mass. CT ABDOMEN PELVIS FINDINGS Hepatobiliary: No solid liver abnormality is seen. No gallstones, gallbladder wall thickening, or biliary dilatation. Pancreas: Unremarkable. No pancreatic ductal dilatation or surrounding inflammatory changes. Spleen: Normal in size without significant abnormality. Adrenals/Urinary Tract: Adrenal glands are unremarkable. Kidneys are normal, without renal calculi, solid lesion, or hydronephrosis. Thickening of the urinary bladder wall. Stomach/Bowel: Stomach is within normal limits.  Appendix appears normal. No evidence of bowel wall thickening, distention, or inflammatory changes. Vascular/Lymphatic: Aortic atherosclerosis. No enlarged abdominal or pelvic lymph nodes. Reproductive: Mild prostatomegaly. Other: Status post interval bilateral inguinal hernia repair with overlying postoperative change (series 2, image 105). No abdominopelvic ascites. Musculoskeletal: Unchanged sclerotic osseous lesions of T8, L3, L4, and L5, as well as of the right ilium (series 2, image 91). IMPRESSION: 1. Incidental note of saddle pulmonary embolus and additional bilateral lobar to segmental embolus. This is of  relatively small volume despite very central location. 2. Unchanged post treatment appearance of a superior segment left lower lobe mass. 3. Unchanged small bilateral pulmonary nodules, largest of the central right middle lobe measuring 1.8 x 1.1 cm. 4. Previously seen sub solid opacities and ground-glass, most conspicuously in the peripheral right upper lobe, is almost completely resolved, consistent with resolution of infection or inflammation. 5. There is new subpleural consolidation of the inferior right costophrenic recess, nonspecific and infectious or inflammatory. This is not distal to occlusive pulmonary embolus and does not likely reflect a pulmonary infarction. 6. Unchanged sclerotic osseous lesions of T8, L3, L4, and L5, as well as of the right ilium. 7. Status post interval bilateral inguinal hernia repair with overlying postoperative change. 8. Thickening of the urinary bladder wall, likely due to chronic outlet obstruction. Prostatomegaly. 9. Coronary artery disease. Call report request was placed at the time of dictation. Final communication will be documented. Aortic Atherosclerosis (ICD10-I70.0). Electronically Signed   By: Khori Candle M.D.   On: 11/27/2020 11:04   CT Abdomen Pelvis W Contrast  Result Date: 11/27/2020 CLINICAL DATA:  Primary Cancer Type: Lung Imaging Indication: Assess response to therapy Interval therapy since last imaging? Yes Initial Cancer Diagnosis Date: 03/28/2019; Established by: Biopsy-proven Detailed Pathology: Stage IV non-small cell lung cancer, adenocarcinoma. Primary Tumor location: Left lower lobe. Osseous metastatic disease. Surgeries: Bilateral open inguinal hernia repair with mesh 11/12/2020. Chemotherapy: Yes; Ongoing?  Yes; Tagrisso daily Immunotherapy? No Radiation therapy? Yes; Date Range: 04/11/2019 - 04/24/2019; Target: Left humerus EXAM: CT CHEST, ABDOMEN, AND PELVIS WITH CONTRAST TECHNIQUE: Multidetector CT imaging of the chest, abdomen and pelvis was  performed following the standard protocol during bolus administration of intravenous contrast. CONTRAST:  175m OMNIPAQUE IOHEXOL 300 MG/ML SOLN, additional oral enteric contrast COMPARISON:  Most recent CT chest, abdomen and pelvis 08/21/2020. 04/04/2019 PET-CT. FINDINGS: CT CHEST FINDINGS Cardiovascular: Aortic atherosclerosis. Normal heart size. Three-vessel coronary artery calcifications. No pericardial effusion. Incidental note of saddle pulmonary embolus and additional bilateral lobar to segmental embolus, which is of relatively small volume despite very central location (series 2, image 28). Mediastinum/Nodes: No enlarged mediastinal, hilar, or axillary lymph nodes. Thyroid gland, trachea, and esophagus demonstrate no significant findings. Lungs/Pleura: Unchanged post treatment appearance of a superior segment left lower lobe mass, measuring 3.2 x 1.8 cm (series 6, image 70). Previously seen sub solid opacities and ground-glass, most conspicuously in the peripheral right upper lobe, is almost completely resolved (series 6, image 68). There is new subpleural consolidation of the inferior right costophrenic recess (series 6, image 124). Unchanged bandlike scarring or atelectasis of the left lung base. Unchanged peribronchial nodule of the central right middle lobe measuring 1.8 x 1.1 cm (series 6, image 83). Occasional small pulmonary nodules are unchanged, for example a 4 mm nodule of the anterior left upper lobe (series 6, image 78). No pleural effusion or pneumothorax. Musculoskeletal: No chest wall mass. CT ABDOMEN PELVIS FINDINGS Hepatobiliary: No solid liver abnormality is seen. No gallstones, gallbladder  wall thickening, or biliary dilatation. Pancreas: Unremarkable. No pancreatic ductal dilatation or surrounding inflammatory changes. Spleen: Normal in size without significant abnormality. Adrenals/Urinary Tract: Adrenal glands are unremarkable. Kidneys are normal, without renal calculi, solid lesion, or  hydronephrosis. Thickening of the urinary bladder wall. Stomach/Bowel: Stomach is within normal limits. Appendix appears normal. No evidence of bowel wall thickening, distention, or inflammatory changes. Vascular/Lymphatic: Aortic atherosclerosis. No enlarged abdominal or pelvic lymph nodes. Reproductive: Mild prostatomegaly. Other: Status post interval bilateral inguinal hernia repair with overlying postoperative change (series 2, image 105). No abdominopelvic ascites. Musculoskeletal: Unchanged sclerotic osseous lesions of T8, L3, L4, and L5, as well as of the right ilium (series 2, image 91). IMPRESSION: 1. Incidental note of saddle pulmonary embolus and additional bilateral lobar to segmental embolus. This is of relatively small volume despite very central location. 2. Unchanged post treatment appearance of a superior segment left lower lobe mass. 3. Unchanged small bilateral pulmonary nodules, largest of the central right middle lobe measuring 1.8 x 1.1 cm. 4. Previously seen sub solid opacities and ground-glass, most conspicuously in the peripheral right upper lobe, is almost completely resolved, consistent with resolution of infection or inflammation. 5. There is new subpleural consolidation of the inferior right costophrenic recess, nonspecific and infectious or inflammatory. This is not distal to occlusive pulmonary embolus and does not likely reflect a pulmonary infarction. 6. Unchanged sclerotic osseous lesions of T8, L3, L4, and L5, as well as of the right ilium. 7. Status post interval bilateral inguinal hernia repair with overlying postoperative change. 8. Thickening of the urinary bladder wall, likely due to chronic outlet obstruction. Prostatomegaly. 9. Coronary artery disease. Call report request was placed at the time of dictation. Final communication will be documented. Aortic Atherosclerosis (ICD10-I70.0). Electronically Signed   By: Jarron Candle M.D.   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    He is status post palliative radiotherapy to the metastatic disease in the left arm under the care of Dr. Sondra Come. This was completed in October 2020.  The patient is currently undergoing treatment with target therapy with Tagrisso 80 mg p.o. daily started 19 months ago.  The patient recently had a restaging CT scan performed. Dr. Julien Nordmann personally and independently reviewed the scan and discussed the results with the patient today. The scan showed no evidence for disease progrsesion. The scan incidentally noted a saddle pulmonary embolus and additional bilateral lobar to segmental embolus. This is of relatively small volume despite very central location.  Dr. Julien Nordmann recommends that he continues on the same treatment of [place].   The patient previously was on Xarelto but had hemoptysis. Discussed that I will send in a prescription for the Eliquis starter pack. He was specifically instructed to call me once he needs a refill so I can send the maintenance dose. He also was advised to not take NSAIDs, specifically the mobic that was listed in his medication list, as well as hold the aspirin. If he develops any bleeding or bruising, he was advised to call me. His oxygen saturation is 100% on RA. He denies chest pain or shortness  of breath. Discussed if he develops any concerning respiratory symptoms in the interval, to go immediately to the emergency room. He expressed understanding.   Dr. Julien Nordmann recommends he continue on the same treatment at the same  dose.  We will arrange for a follow up visit in 1 months for evaluation and repeat lab work.   His pulse is a little low today likely due to his coreg. He is asymptomatic. Discussed with Dr. Julien Nordmann who encouraged close monitoring with his cardiology team since [place] can cause QT prolongation.  For his arm pain, discussed we can always re-image his left humerus if needed for new or worsening pain due the history of metastatic bone lesions to this area.   He will continue to take imodium if needed for diarrhea which occurs intermittently and can be caused by Tagrisso.    The patient was advised to call immediately if he has any concerning symptoms in the interval. The patient voices understanding of current disease status and treatment options and is in agreement with the current care plan. All questions were answered. The patient knows to call the clinic with any problems, questions or concerns. We can certainly see the patient much sooner if necessary    Orders Placed This Encounter  Procedures  . CBC with Differential (Cancer Center Only)    Standing Status:   Future    Standing Expiration Date:   11/27/2021  . CMP ([facility] only)    Standing Status:   Future    Standing Expiration Date:   11/27/2021      Tobe Sos Debrah Granderson, PA-C 11/27/20  ADDENDUM: Hematology/Oncology Attending: I had a face-to-face encounter with the patient today.  I reviewed his records, lab and scan and recommended his care plan.  This is a very pleasant 70 years old white male with metastatic non-small cell lung cancer, adenocarcinoma with positive EGFR mutation with deletion in exon 7 diagnosed in September 2020.  The patient is currently undergoing treatment with targeted therapy with Tagrisso 80 mg p.o. daily status post 19 months of treatment and the patient has been tolerating this treatment well with no concerning adverse effects. He recently underwent hernia repair.  He is recovering well  from his surgery. The patient had repeat CT scan of the chest, abdomen pelvis performed recently.  I personally and independently reviewed the scans and discussed the results with the patient today. His scan showed no concerning findings for disease progression but unfortunately he developed sudden pulmonary embolus and additional bilateral lobar to segmental embolus. I discussed the results with the patient and recommended for him to continue his current treatment with Tagrisso with the same dose. For the blood clot, we will start the patient on Eliquis 5 mg p.o. twice daily. The patient will come back for follow-up visit in 1 months for evaluation and repeat blood work. The patient was advised to call immediately if he has any concerning symptoms in the interval. The total time spent in the appointment was 35 minutes. Disclaimer: This note was dictated with voice recognition software. Similar sounding words can inadvertently be transcribed and may be missed upon review. Eilleen Kempf, MD 11/27/20

## 2020-11-26 ENCOUNTER — Ambulatory Visit (HOSPITAL_COMMUNITY)
Admission: RE | Admit: 2020-11-26 | Discharge: 2020-11-26 | Disposition: A | Discharge status: Home / Self Care | Payer: Medicare Other | Source: Ambulatory Visit | Attending: Internal Medicine | Admitting: Internal Medicine

## 2020-11-26 ENCOUNTER — Other Ambulatory Visit: Payer: Self-pay

## 2020-11-26 DIAGNOSIS — C3490 Malignant neoplasm of unspecified part of unspecified bronchus or lung: Secondary | ICD-10-CM | POA: Diagnosis not present

## 2020-11-26 DIAGNOSIS — I700 Atherosclerosis of aorta: Secondary | ICD-10-CM | POA: Diagnosis not present

## 2020-11-26 DIAGNOSIS — Z5111 Encounter for antineoplastic chemotherapy: Secondary | ICD-10-CM | POA: Diagnosis not present

## 2020-11-26 DIAGNOSIS — I2692 Saddle embolus of pulmonary artery without acute cor pulmonale: Secondary | ICD-10-CM | POA: Diagnosis not present

## 2020-11-26 DIAGNOSIS — C7951 Secondary malignant neoplasm of bone: Secondary | ICD-10-CM | POA: Diagnosis not present

## 2020-11-26 DIAGNOSIS — N3289 Other specified disorders of bladder: Secondary | ICD-10-CM | POA: Diagnosis not present

## 2020-11-26 DIAGNOSIS — I2510 Atherosclerotic heart disease of native coronary artery without angina pectoris: Secondary | ICD-10-CM | POA: Diagnosis not present

## 2020-11-26 MED ORDER — IOHEXOL 300 MG/ML  SOLN
100.0000 mL | Freq: Once | INTRAMUSCULAR | Status: AC | PRN
  Administered 2020-11-26: 100 mL via INTRAVENOUS

## 2020-11-26 MED ORDER — SODIUM CHLORIDE (PF) 0.9 % IJ SOLN
INTRAMUSCULAR | Status: AC
  Filled 2020-11-26: qty 50

## 2020-11-27 ENCOUNTER — Inpatient Hospital Stay: Payer: Medicare Other | Admitting: Physician Assistant

## 2020-11-27 ENCOUNTER — Inpatient Hospital Stay: Payer: Medicare Other | Attending: Internal Medicine

## 2020-11-27 VITALS — BP 139/62 | HR 50 | Temp 97.0°F | Resp 20 | Ht 68.0 in | Wt 172.2 lb

## 2020-11-27 DIAGNOSIS — G4730 Sleep apnea, unspecified: Secondary | ICD-10-CM | POA: Insufficient documentation

## 2020-11-27 DIAGNOSIS — C7951 Secondary malignant neoplasm of bone: Secondary | ICD-10-CM | POA: Insufficient documentation

## 2020-11-27 DIAGNOSIS — Z7901 Long term (current) use of anticoagulants: Secondary | ICD-10-CM | POA: Insufficient documentation

## 2020-11-27 DIAGNOSIS — Z86718 Personal history of other venous thrombosis and embolism: Secondary | ICD-10-CM | POA: Insufficient documentation

## 2020-11-27 DIAGNOSIS — I2699 Other pulmonary embolism without acute cor pulmonale: Secondary | ICD-10-CM | POA: Insufficient documentation

## 2020-11-27 DIAGNOSIS — C3492 Malignant neoplasm of unspecified part of left bronchus or lung: Secondary | ICD-10-CM | POA: Diagnosis not present

## 2020-11-27 DIAGNOSIS — I4891 Unspecified atrial fibrillation: Secondary | ICD-10-CM | POA: Insufficient documentation

## 2020-11-27 DIAGNOSIS — I2692 Saddle embolus of pulmonary artery without acute cor pulmonale: Secondary | ICD-10-CM

## 2020-11-27 DIAGNOSIS — N400 Enlarged prostate without lower urinary tract symptoms: Secondary | ICD-10-CM | POA: Insufficient documentation

## 2020-11-27 DIAGNOSIS — Z8673 Personal history of transient ischemic attack (TIA), and cerebral infarction without residual deficits: Secondary | ICD-10-CM | POA: Insufficient documentation

## 2020-11-27 DIAGNOSIS — C3432 Malignant neoplasm of lower lobe, left bronchus or lung: Secondary | ICD-10-CM | POA: Diagnosis not present

## 2020-11-27 DIAGNOSIS — C3490 Malignant neoplasm of unspecified part of unspecified bronchus or lung: Secondary | ICD-10-CM

## 2020-11-27 DIAGNOSIS — Z79899 Other long term (current) drug therapy: Secondary | ICD-10-CM | POA: Diagnosis not present

## 2020-11-27 LAB — CBC WITH DIFFERENTIAL (CANCER CENTER ONLY)
Abs Immature Granulocytes: 0.03 10*3/uL (ref 0.00–0.07)
Basophils Absolute: 0.1 10*3/uL (ref 0.0–0.1)
Basophils Relative: 1 %
Eosinophils Absolute: 0.1 10*3/uL (ref 0.0–0.5)
Eosinophils Relative: 1 %
HCT: 39.5 % (ref 39.0–52.0)
Hemoglobin: 13.2 g/dL (ref 13.0–17.0)
Immature Granulocytes: 0 %
Lymphocytes Relative: 20 %
Lymphs Abs: 1.6 10*3/uL (ref 0.7–4.0)
MCH: 32.8 pg (ref 26.0–34.0)
MCHC: 33.4 g/dL (ref 30.0–36.0)
MCV: 98 fL (ref 80.0–100.0)
Monocytes Absolute: 0.8 10*3/uL (ref 0.1–1.0)
Monocytes Relative: 10 %
Neutro Abs: 5.4 10*3/uL (ref 1.7–7.7)
Neutrophils Relative %: 68 %
Platelet Count: 186 10*3/uL (ref 150–400)
RBC: 4.03 MIL/uL — ABNORMAL LOW (ref 4.22–5.81)
RDW: 13 % (ref 11.5–15.5)
WBC Count: 8 10*3/uL (ref 4.0–10.5)
nRBC: 0 % (ref 0.0–0.2)

## 2020-11-27 LAB — CMP (CANCER CENTER ONLY)
ALT: 19 U/L (ref 0–44)
AST: 19 U/L (ref 15–41)
Albumin: 3.7 g/dL (ref 3.5–5.0)
Alkaline Phosphatase: 97 U/L (ref 38–126)
Anion gap: 11 (ref 5–15)
BUN: 18 mg/dL (ref 8–23)
CO2: 27 mmol/L (ref 22–32)
Calcium: 9.7 mg/dL (ref 8.9–10.3)
Chloride: 107 mmol/L (ref 98–111)
Creatinine: 1.41 mg/dL — ABNORMAL HIGH (ref 0.61–1.24)
GFR, Estimated: 54 mL/min — ABNORMAL LOW (ref >60)
Glucose, Bld: 115 mg/dL — ABNORMAL HIGH (ref 70–99)
Potassium: 5 mmol/L (ref 3.5–5.1)
Sodium: 145 mmol/L (ref 135–145)
Total Bilirubin: 0.6 mg/dL (ref 0.3–1.2)
Total Protein: 7 g/dL (ref 6.5–8.1)

## 2020-11-27 MED ORDER — ELIQUIS 5 MG PO TABS: ORAL_TABLET | ORAL | 0 refills | Status: DC

## 2020-12-02 ENCOUNTER — Telehealth: Payer: Self-pay | Admitting: Internal Medicine

## 2020-12-02 NOTE — Telephone Encounter (Signed)
Scheduled per los. Called and spoke with patient. Confirmed appt 

## 2020-12-05 DIAGNOSIS — I10 Essential (primary) hypertension: Secondary | ICD-10-CM | POA: Diagnosis not present

## 2020-12-05 DIAGNOSIS — M170 Bilateral primary osteoarthritis of knee: Secondary | ICD-10-CM | POA: Diagnosis not present

## 2020-12-05 DIAGNOSIS — E782 Mixed hyperlipidemia: Secondary | ICD-10-CM | POA: Diagnosis not present

## 2020-12-05 DIAGNOSIS — C3432 Malignant neoplasm of lower lobe, left bronchus or lung: Secondary | ICD-10-CM | POA: Diagnosis not present

## 2020-12-05 DIAGNOSIS — G8929 Other chronic pain: Secondary | ICD-10-CM | POA: Diagnosis not present

## 2020-12-18 ENCOUNTER — Telehealth: Payer: Self-pay | Admitting: Medical Oncology

## 2020-12-18 ENCOUNTER — Other Ambulatory Visit: Payer: Self-pay | Admitting: Physician Assistant

## 2020-12-18 ENCOUNTER — Telehealth: Payer: Self-pay

## 2020-12-18 DIAGNOSIS — I2692 Saddle embolus of pulmonary artery without acute cor pulmonale: Secondary | ICD-10-CM

## 2020-12-18 MED ORDER — APIXABAN 5 MG PO TABS
5.0000 mg | ORAL_TABLET | Freq: Two times a day (BID) | ORAL | 2 refills | Status: DC
Start: 2020-12-18 — End: 2021-03-13

## 2020-12-18 NOTE — Telephone Encounter (Signed)
[name] refill requested. Does he need to stay on it?

## 2020-12-18 NOTE — Telephone Encounter (Signed)
I spoke with pt to advise his Eliquis refill has been sent. Pt expressed understanding of this information.

## 2020-12-23 NOTE — Progress Notes (Signed)
[facility] OFFICE PROGRESS NOTE  Aura Dials, MD [place] [address]  DIAGNOSIS: Stage IV (T3, N0, M1c) non-small cell lung cancer, adenocarcinoma presented with cavitary left lower lobe lung mass in addition to other pulmonary nodules and scattered hypermetabolic osseous metastatic disease involving the spine, ribs and bony pelvis as well as left proximal humeral pathologic fracture.   Molecular Biomarkers performed by [employer]ECTED [employer]TER[employer](S) / [name]      [id number] Afatinib, Dacomitinib, Erlotinib, Gefitinib, Osimertinib, Ramucirumab Neratinib   FGFR1Amplification Erdafitinib, Lenvatinib, Nintedanib, Pazopanib, Pemigatinib, Ponatinib   [id number] None  PRIOR THERAPY: Palliative radiotherapy to the the left humerus under the care of Dr. Sondra Come. Last treatment ~04/24/2019  CURRENT THERAPY: Targeted therapy with Tagrisso 80 p.o. daily.  He started the first dose on April 21, 2019. Status post 20 months of treatment.   INTERVAL HISTORY: Albert Perez 70 y.o. male returns to the clinic today for a follow up visit. The patient is feeling overall well today without any concerning complaints except for left upper extremity pain/weakness. At his last appointment, he had incidental findings of a saddle pulmonary embolus and additional bilateral lobar to segmental embolus. He previous had hemoptysis to Xarelto, therefore, he was started on Eliquis. He is tolerating this well without any concerning adverse side effects. He denies any hemoptysis, bleeding, or bruising.   The patient's left upper arm pain began in May after he had an elective hernia repair. He rates his pain ranging from a 2-8 out of 10. He has not taken any medication for it although he has previously applied heating pads to his left shoulder/neck area. He has a previous left proximal humeral pathologic fracture in 2020 from his metastatic bone disease. He received  palliative radiation to this site. He reports his left bicep muscle is weaker although he states he was still able to use it when exercising yesterday. He reports limited range of motion of his left shoulder as well. He denies any recent trauma or any numbness or tingling.   Regarding his treatment with Tagrisso, the patient continues to tolerate targeted treatment with Tagrisso well without any adverse effects. He does report ~4 episodes of diarrhea weekly on average for which he takes imodium. He reports no fatigue and he is eating well despite reporting a decreased appetite. Denies any fever, chills, night sweats, or weight loss. Denies any chest pain, shortness of breath, cough, or hemoptysis. Denies any nausea, vomiting, or constipation. Denies any headache or visual changes. Denies any rashes or skin changes. The patient is here today for evaluation for ongoing treatment with Tagrisso.   MEDICAL HISTORY: Past Medical History:  Diagnosis Date   A-fib [facility])    PMH: 2000-2001   Arthritis    Cancer ([place])    melanoma 2018   Diarrhea    due to medications   DVT (deep venous thrombosis) ([facility])    Left brachial vein DVT (01/30/19, following fracture)   Dysrhythmia    afib -    ETOH abuse    GERD (gastroesophageal reflux disease)    ocassional   Lung cancer ([facility]) dx'd 02/26/2019   Metastatic cancer to bone ([facility]) dx'd 02/26/2019   Sleep apnea    do not wear CPAP   Stroke ([place] [place]) 2019   " mini"   Vertigo    Wears glasses     ALLERGIES:  has No Known Allergies.  MEDICATIONS:  Current Outpatient Medications  Medication Sig Dispense Refill  acetaminophen (TYLENOL) 650 MG CR tablet Take 650 mg by mouth every 8 (eight) hours as needed for pain.     apixaban (ELIQUIS) 5 MG TABS tablet Take 1 tablet (5 mg total) by mouth 2 (two) times daily. 60 tablet 2   buPROPion (WELLBUTRIN XL) 150 MG 24 hr tablet Take 150 mg by mouth daily as needed (nicotine cravings).     carvedilol (COREG) 6.25 MG tablet  TAKE 1 TABLET BY MOUTH TWICE A DAY (Patient taking differently: Take 6.25 mg by mouth 2 (two) times daily with a meal.) 180 tablet 3   loperamide (IMODIUM) 2 MG capsule Take 2 mg by mouth as needed for diarrhea or loose stools.     Multiple Vitamin (MULTIVITAMIN PO) Take 1 tablet by mouth daily. Centrum     Omega-3 Fatty Acids (OMEGA-3 PO) Take 1 capsule by mouth daily.     osimertinib mesylate (TAGRISSO) 80 MG tablet Take 1 tablet (80 mg total) by mouth daily. 30 tablet 2   OVER THE COUNTER MEDICATION Take 1 tablet by mouth daily. Standard Process, LivaPlex (Liver supplement)     rosuvastatin (CRESTOR) 5 MG tablet Take 5 mg by mouth daily.  2   TURMERIC PO Take 1 capsule by mouth daily.     vitamin B-12 (CYANOCOBALAMIN) 1000 MCG tablet Take 1,000 mcg by mouth daily.     No current facility-administered medications for this visit.    SURGICAL HISTORY:  Past Surgical History:  Procedure Laterality Date   ANKLE FRACTURE SURGERY Right 2003   COLONOSCOPY  2018   ELBOW SURGERY Left 1964   INGUINAL HERNIA REPAIR Bilateral 11/12/2020   Procedure: BILATERAL OPEN INGUINAL HERNIA REPAIR WITH MESH;  Surgeon: Coralie Keens, MD;  Location: [facility];  Service: General;  Laterality: Bilateral;   melanoma removal  04/2017   Lt shoulder   RETINAL DETACHMENT SURGERY Right 1971   TONSILLECTOMY     VIDEO BRONCHOSCOPY WITH ENDOBRONCHIAL NAVIGATION N/A 03/28/2019   Procedure: VIDEO BRONCHOSCOPY WITH ENDOBRONCHIAL NAVIGATION and Biopsies;  Surgeon: Collene Gobble, MD;  Location: [facility];  Service: Thoracic;  Laterality: N/A;    REVIEW OF SYSTEMS:   Review of Systems  Constitutional: Positive decreased appetite. Negative for chills, fatigue, fever and unexpected weight change.  HENT: Negative for mouth sores, nosebleeds, sore throat and trouble swallowing.   Eyes: Negative for eye problems and icterus.  Respiratory: Negative for cough, hemoptysis, shortness of breath and wheezing.   Cardiovascular: Negative  for chest pain and leg swelling.  Gastrointestinal: Positive intermittent diarrhea. Negative for abdominal pain, constipation, nausea and vomiting.  Genitourinary: Negative for bladder incontinence, difficulty urinating, dysuria, frequency and hematuria.   Musculoskeletal: Positive arm/shoulder pain, reduced range of motion. Negative for back pain, gait problem, neck pain and neck stiffness.  Skin: Negative for itching and rash.  Neurological: Negative for dizziness, extremity weakness, gait problem, headaches, light-headedness and seizures.  Hematological: Negative for adenopathy. Does not bruise/bleed easily.  Psychiatric/Behavioral: Negative for confusion, depression and sleep disturbance. The patient is not nervous/anxious.     PHYSICAL EXAMINATION:  Blood pressure (!) 121/48, pulse (!) 58, temperature (!) 97.4 F (36.3 C), temperature source Tympanic, resp. rate 18, height 5' 8" (1.727 m), weight 174 lb 14.4 oz (79.3 kg), SpO2 100 %.  ECOG PERFORMANCE STATUS: 1  Physical Exam  Constitutional: Oriented to person, place, and time and well-developed, well-nourished, and in no distress.  HENT:  Head: Normocephalic and atraumatic.  Mouth/Throat: Oropharynx is clear and moist. No oropharyngeal exudate.  Eyes: Conjunctivae are normal. Right eye exhibits no discharge. Left eye exhibits no discharge. No scleral icterus.  Neck: Normal range of motion. Neck supple.  Cardiovascular: Normal rate, regular rhythm, normal heart sounds and intact distal pulses.   Pulmonary/Chest: Effort normal and breath sounds normal. No respiratory distress. No wheezes. No rales.  Abdominal: Soft. Bowel sounds are normal. Exhibits no distension and no mass. There is no tenderness.  Musculoskeletal: Limited ROM and pain with left shoulder flexion past 90 degrees. Exhibits no edema. Normal sensation in upper extremities. Strength equal and symmetric in upper extremities.  Lymphadenopathy: No cervical adenopathy.   Neurological: Alert and oriented to person, place, and time. Shoulder and upper extremity strength equal bilaterally. Sensation intact. Gait normal. Coordination normal.  Skin: Skin is warm and dry. No rash noted. Not diaphoretic. No erythema. No pallor.  Psychiatric: Mood, memory and judgment normal.  Vitals reviewed.  LABORATORY DATA: Lab Results  Component Value Date   WBC 6.3 12/29/2020   HGB 12.6 (L) 12/29/2020   HCT 37.3 (L) 12/29/2020   MCV 97.9 12/29/2020   PLT 170 12/29/2020      Chemistry      Component Value Date/Time   NA 140 12/29/2020 1048   K 5.1 12/29/2020 1048   CL 108 12/29/2020 1048   CO2 25 12/29/2020 1048   BUN 21 12/29/2020 1048   CREATININE 1.36 (H) 12/29/2020 1048      Component Value Date/Time   CALCIUM 8.9 12/29/2020 1048   ALKPHOS 90 12/29/2020 1048   AST 23 12/29/2020 1048   ALT 19 12/29/2020 1048   BILITOT 0.5 12/29/2020 1048       RADIOGRAPHIC STUDIES:  No results found.   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    He is status post palliative radiotherapy to the metastatic disease in the left arm under the care of Dr. Sondra Come. This was completed in October 2020.   The patient is currently undergoing treatment with target therapy with Tagrisso 80 mg p.o. daily started 20 months ago.   Labs were reviewed. Recommend that he continue on the same treatment at the same dose.   We will see him back for a follow up visit in 4 weeks for evaluation and repeat blood work.   He will continue take eliquis for the recent PE in May 2022.   The  patient will have a CT scan of his left arm in the coming days to evaluate causes of his arm pain including worsening pathologic fracture or worsening metastatic disease. I will personally call the patient with the scan results and will determine the best course of action based on the results.   He will continue to take imodium if needed for diarrhea which occurs intermittently and can be caused by Tagrisso.  The patient was advised to call immediately if he has any concerning symptoms in the interval. The patient voices understanding of current disease status and treatment options and is in agreement with the current care plan. All questions were answered. The patient knows to call the clinic with any problems, questions or concerns. We can certainly see the patient much sooner  if necessary              Orders Placed This Encounter  Procedures   CT HUMERUS LEFT W WO CONTRAST    Standing Status:   Future    Standing Expiration Date:   12/29/2021    Order Specific Question:   If indicated for the ordered procedure, I authorize the administration of contrast media per Radiology protocol    Answer:   Yes    Order Specific Question:   Preferred imaging location?    Answer:   [facility]   CBC with Differential ([place] Only)    Standing Status:   Future    Standing Expiration Date:   12/29/2021   CMP ([facility] only)    Standing Status:   Future    Standing Expiration Date:   12/29/2021      The total time spent in the appointment was 30-39 minutes in this encounter.   Cassandra L Heilingoetter, PA-C 12/29/20

## 2020-12-29 ENCOUNTER — Other Ambulatory Visit: Payer: Self-pay

## 2020-12-29 ENCOUNTER — Encounter: Payer: Self-pay | Admitting: Physician Assistant

## 2020-12-29 ENCOUNTER — Inpatient Hospital Stay: Payer: Medicare Other

## 2020-12-29 ENCOUNTER — Inpatient Hospital Stay: Payer: Medicare Other | Attending: Internal Medicine | Admitting: Physician Assistant

## 2020-12-29 VITALS — BP 121/48 | HR 58 | Temp 97.4°F | Resp 18 | Ht 68.0 in | Wt 174.9 lb

## 2020-12-29 DIAGNOSIS — Z8673 Personal history of transient ischemic attack (TIA), and cerebral infarction without residual deficits: Secondary | ICD-10-CM | POA: Diagnosis not present

## 2020-12-29 DIAGNOSIS — C3492 Malignant neoplasm of unspecified part of left bronchus or lung: Secondary | ICD-10-CM | POA: Diagnosis not present

## 2020-12-29 DIAGNOSIS — Z79899 Other long term (current) drug therapy: Secondary | ICD-10-CM | POA: Diagnosis not present

## 2020-12-29 DIAGNOSIS — C7951 Secondary malignant neoplasm of bone: Secondary | ICD-10-CM | POA: Insufficient documentation

## 2020-12-29 DIAGNOSIS — G4730 Sleep apnea, unspecified: Secondary | ICD-10-CM | POA: Diagnosis not present

## 2020-12-29 DIAGNOSIS — Z85820 Personal history of malignant melanoma of skin: Secondary | ICD-10-CM | POA: Diagnosis not present

## 2020-12-29 DIAGNOSIS — Z7901 Long term (current) use of anticoagulants: Secondary | ICD-10-CM | POA: Diagnosis not present

## 2020-12-29 DIAGNOSIS — Z86718 Personal history of other venous thrombosis and embolism: Secondary | ICD-10-CM | POA: Insufficient documentation

## 2020-12-29 DIAGNOSIS — M84522D Pathological fracture in neoplastic disease, left humerus, subsequent encounter for fracture with routine healing: Secondary | ICD-10-CM

## 2020-12-29 DIAGNOSIS — I4891 Unspecified atrial fibrillation: Secondary | ICD-10-CM | POA: Diagnosis not present

## 2020-12-29 DIAGNOSIS — C3432 Malignant neoplasm of lower lobe, left bronchus or lung: Secondary | ICD-10-CM | POA: Insufficient documentation

## 2020-12-29 DIAGNOSIS — I2692 Saddle embolus of pulmonary artery without acute cor pulmonale: Secondary | ICD-10-CM

## 2020-12-29 DIAGNOSIS — Z86711 Personal history of pulmonary embolism: Secondary | ICD-10-CM | POA: Insufficient documentation

## 2020-12-29 LAB — CBC WITH DIFFERENTIAL (CANCER CENTER ONLY)
Abs Immature Granulocytes: 0.03 10*3/uL (ref 0.00–0.07)
Basophils Absolute: 0 10*3/uL (ref 0.0–0.1)
Basophils Relative: 0 %
Eosinophils Absolute: 0.1 10*3/uL (ref 0.0–0.5)
Eosinophils Relative: 1 %
HCT: 37.3 % — ABNORMAL LOW (ref 39.0–52.0)
Hemoglobin: 12.6 g/dL — ABNORMAL LOW (ref 13.0–17.0)
Immature Granulocytes: 1 %
Lymphocytes Relative: 22 %
Lymphs Abs: 1.4 10*3/uL (ref 0.7–4.0)
MCH: 33.1 pg (ref 26.0–34.0)
MCHC: 33.8 g/dL (ref 30.0–36.0)
MCV: 97.9 fL (ref 80.0–100.0)
Monocytes Absolute: 0.7 10*3/uL (ref 0.1–1.0)
Monocytes Relative: 11 %
Neutro Abs: 4.1 10*3/uL (ref 1.7–7.7)
Neutrophils Relative %: 65 %
Platelet Count: 170 10*3/uL (ref 150–400)
RBC: 3.81 MIL/uL — ABNORMAL LOW (ref 4.22–5.81)
RDW: 14.3 % (ref 11.5–15.5)
WBC Count: 6.3 10*3/uL (ref 4.0–10.5)
nRBC: 0 % (ref 0.0–0.2)

## 2020-12-29 LAB — CMP (CANCER CENTER ONLY)
ALT: 19 U/L (ref 0–44)
AST: 23 U/L (ref 15–41)
Albumin: 3.4 g/dL — ABNORMAL LOW (ref 3.5–5.0)
Alkaline Phosphatase: 90 U/L (ref 38–126)
Anion gap: 7 (ref 5–15)
BUN: 21 mg/dL (ref 8–23)
CO2: 25 mmol/L (ref 22–32)
Calcium: 8.9 mg/dL (ref 8.9–10.3)
Chloride: 108 mmol/L (ref 98–111)
Creatinine: 1.36 mg/dL — ABNORMAL HIGH (ref 0.61–1.24)
GFR, Estimated: 56 mL/min — ABNORMAL LOW (ref >60)
Glucose, Bld: 96 mg/dL (ref 70–99)
Potassium: 5.1 mmol/L (ref 3.5–5.1)
Sodium: 140 mmol/L (ref 135–145)
Total Bilirubin: 0.5 mg/dL (ref 0.3–1.2)
Total Protein: 6.6 g/dL (ref 6.5–8.1)

## 2021-01-06 ENCOUNTER — Encounter (HOSPITAL_COMMUNITY): Payer: Self-pay

## 2021-01-06 ENCOUNTER — Other Ambulatory Visit: Payer: Self-pay

## 2021-01-06 ENCOUNTER — Other Ambulatory Visit: Payer: Self-pay | Admitting: Physician Assistant

## 2021-01-06 ENCOUNTER — Ambulatory Visit (HOSPITAL_COMMUNITY)
Admission: RE | Admit: 2021-01-06 | Discharge: 2021-01-06 | Disposition: A | Discharge status: Home / Self Care | Payer: Medicare Other | Source: Ambulatory Visit | Attending: Physician Assistant | Admitting: Physician Assistant

## 2021-01-06 DIAGNOSIS — C3492 Malignant neoplasm of unspecified part of left bronchus or lung: Secondary | ICD-10-CM | POA: Insufficient documentation

## 2021-01-06 DIAGNOSIS — M79602 Pain in left arm: Secondary | ICD-10-CM | POA: Diagnosis not present

## 2021-01-06 DIAGNOSIS — Z85118 Personal history of other malignant neoplasm of bronchus and lung: Secondary | ICD-10-CM | POA: Diagnosis not present

## 2021-01-06 DIAGNOSIS — M19012 Primary osteoarthritis, left shoulder: Secondary | ICD-10-CM | POA: Diagnosis not present

## 2021-01-06 MED ORDER — IOHEXOL 300 MG/ML  SOLN
100.0000 mL | Freq: Once | INTRAMUSCULAR | Status: AC | PRN
  Administered 2021-01-06: 100 mL via INTRAVENOUS

## 2021-01-06 MED ORDER — SODIUM CHLORIDE (PF) 0.9 % IJ SOLN
INTRAMUSCULAR | Status: AC
  Filled 2021-01-06: qty 50

## 2021-01-07 ENCOUNTER — Telehealth: Payer: Self-pay | Admitting: Physician Assistant

## 2021-01-07 NOTE — Telephone Encounter (Signed)
Called the patient and reviewed the CT of the left arm with him.

## 2021-01-09 ENCOUNTER — Telehealth: Payer: Self-pay | Admitting: Physician Assistant

## 2021-01-09 NOTE — Telephone Encounter (Signed)
I called the patient and let him know I discussed his CT of the arm with Dr. Julien Nordmann. Dr. Julien Nordmann does not believe his arm concerns are related to his malignancy. As previously noted on his scan, there is a large cyst in the left upper extremity. Unclear if this would be causing some of his range of motion concerns in his arm or not or if it is a concern with his neck or shoulder. Advised to take tylenol and if permitted with his other health concerns, he can take NSAIDs sparingly if needed. If this does not improve, Dr. Julien Nordmann would recommend that he follow up with his PCP or his orthopedic physician for further evaluation. The patient expressed understanding.

## 2021-01-13 DIAGNOSIS — E782 Mixed hyperlipidemia: Secondary | ICD-10-CM | POA: Diagnosis not present

## 2021-01-13 DIAGNOSIS — I10 Essential (primary) hypertension: Secondary | ICD-10-CM | POA: Diagnosis not present

## 2021-01-13 DIAGNOSIS — G8929 Other chronic pain: Secondary | ICD-10-CM | POA: Diagnosis not present

## 2021-01-13 DIAGNOSIS — C3432 Malignant neoplasm of lower lobe, left bronchus or lung: Secondary | ICD-10-CM | POA: Diagnosis not present

## 2021-01-13 DIAGNOSIS — M170 Bilateral primary osteoarthritis of knee: Secondary | ICD-10-CM | POA: Diagnosis not present

## 2021-01-20 NOTE — Progress Notes (Signed)
[facility] OFFICE PROGRESS NOTE  Aura Dials, MD [place] [address]  DIAGNOSIS: Stage IV (T3, N0, M1c) non-small cell lung cancer, adenocarcinoma presented with cavitary left lower lobe lung mass in addition to other pulmonary nodules and scattered hypermetabolic osseous metastatic disease involving the spine, ribs and bony pelvis as well as left proximal humeral pathologic fracture.   Molecular Biomarkers performed by [employer] DETECTED ALTERATION(S) / [name]      [id number] Afatinib, Dacomitinib, Erlotinib, Gefitinib, Osimertinib, Ramucirumab Neratinib   FGFR1Amplification Erdafitinib, Lenvatinib, Nintedanib, Pazopanib, Pemigatinib, Ponatinib   [id number] None  PRIOR THERAPY:  Palliative radiotherapy to the the left humerus under the care of Dr. Sondra Come. Last treatment ~04/24/2019  CURRENT THERAPY: Targeted therapy with Tagrisso 80 p.o. daily.  He started the first dose on April 21, 2019. Status post 21 months of treatment.   INTERVAL HISTORY: Albert Perez 70 y.o. male returns to the clinic today for a follow-up visit.  The patient is feeling fairly well today without any concerning complaints.  At his last appointment, the patient was endorsing left upper extremity pain/weakness.  He has a history of a metastatic bone lesion to this area.  He had a CT scan of this area which did not show any progressive metastatic disease.  It incidentally noted a large cyst.  Unclear if this is could be affecting his strength in his left arm.   Regarding his treatment with Tagrisso, the patient continues to tolerate treatment with targeted treatment well without any concerning adverse side effects except he has intermittent diarrhea for which he takes imodium every 3 days. He is able to manage his diarrhea with imodium.  He denies any fever, chills, night sweats, or unexplained weight loss.  He denies any chest pain, shortness of breath,  cough, or hemoptysis.  He denies any nausea, vomiting, or constipation.  He denies any headache or visual changes.  The patient is here today for evaluation and blood work     MEDICAL HISTORY: Past Medical History:  Diagnosis Date   A-fib [facility])    PMH: 2000-2001   Arthritis    Cancer ([place])    melanoma 2018   Diarrhea    due to medications   DVT (deep venous thrombosis) ([facility])    Left brachial vein DVT (01/30/19, following fracture)   Dysrhythmia    afib -    ETOH abuse    GERD (gastroesophageal reflux disease)    ocassional   Lung cancer ([place]) dx'd 02/26/2019   Metastatic cancer to bone ([place]) dx'd 02/26/2019   Sleep apnea    do not wear CPAP   Stroke ([place]) 2019   " mini"   Vertigo    Wears glasses     ALLERGIES:  has No Known Allergies.  MEDICATIONS:  Current Outpatient Medications  Medication Sig Dispense Refill   acetaminophen (TYLENOL) 650 MG CR tablet Take 650 mg by mouth every 8 (eight) hours as needed for pain.     apixaban (ELIQUIS) 5 MG TABS tablet Take 1 tablet (5 mg total) by mouth 2 (two) times daily. 60 tablet 2   buPROPion (WELLBUTRIN XL) 150 MG 24 hr tablet Take 150 mg by mouth daily as needed (nicotine cravings).     carvedilol (COREG) 6.25 MG tablet TAKE 1 TABLET BY MOUTH TWICE A DAY (Patient taking differently: Take 6.25 mg by mouth 2 (two) times daily with a meal.) 180 tablet 3   loperamide (IMODIUM) 2 MG capsule  Take 2 mg by mouth as needed for diarrhea or loose stools.     Multiple Vitamin (MULTIVITAMIN PO) Take 1 tablet by mouth daily. Centrum     Omega-3 Fatty Acids (OMEGA-3 PO) Take 1 capsule by mouth daily.     osimertinib mesylate (TAGRISSO) 80 MG tablet Take 1 tablet (80 mg total) by mouth daily. 30 tablet 2   OVER THE COUNTER MEDICATION Take 1 tablet by mouth daily. Standard Process, LivaPlex (Liver supplement)     rosuvastatin (CRESTOR) 5 MG tablet Take 5 mg by mouth daily.  2   TURMERIC PO Take 1 capsule by mouth daily.     vitamin B-12  (CYANOCOBALAMIN) 1000 MCG tablet Take 1,000 mcg by mouth daily.     No current facility-administered medications for this visit.    SURGICAL HISTORY:  Past Surgical History:  Procedure Laterality Date   ANKLE FRACTURE SURGERY Right 2003   COLONOSCOPY  2018   ELBOW SURGERY Left 1964   INGUINAL HERNIA REPAIR Bilateral 11/12/2020   Procedure: BILATERAL OPEN INGUINAL HERNIA REPAIR WITH MESH;  Surgeon: Coralie Keens, MD;  Location: [facility];  Service: General;  Laterality: Bilateral;   melanoma removal  04/2017   Lt shoulder   RETINAL DETACHMENT SURGERY Right 1971   TONSILLECTOMY     VIDEO BRONCHOSCOPY WITH ENDOBRONCHIAL NAVIGATION N/A 03/28/2019   Procedure: VIDEO BRONCHOSCOPY WITH ENDOBRONCHIAL NAVIGATION and Biopsies;  Surgeon: Collene Gobble, MD;  Location: [facility] OR;  Service: Thoracic;  Laterality: N/A;    REVIEW OF SYSTEMS:   Constitutional: Positive decreased appetite. Negative for chills, fatigue, fever and unexpected weight change. HENT: Negative for mouth sores, nosebleeds, sore throat and trouble swallowing.   Eyes: Negative for eye problems and icterus. Respiratory: Negative for cough, hemoptysis, shortness of breath and wheezing.   Cardiovascular: Negative for chest pain and leg swelling. Gastrointestinal: Positive intermittent diarrhea. Negative for abdominal pain, constipation, nausea and vomiting. Genitourinary: Negative for bladder incontinence, difficulty urinating, dysuria, frequency and hematuria.   Musculoskeletal: Positive arm/shoulder pain, reduced range of motion. Negative for back pain, gait problem, neck pain and neck stiffness. Skin: Negative for itching and rash. Neurological: Negative for dizziness, extremity weakness, gait problem, headaches, light-headedness and seizures. Hematological: Negative for adenopathy. Does not bruise/bleed easily. Psychiatric/Behavioral: Negative for confusion, depression and sleep disturbance. The patient is not nervous/anxious.      PHYSICAL EXAMINATION:  Blood pressure (!) 151/57, pulse (!) 46, temperature 98.3 F (36.8 C), temperature source Oral, resp. rate 17, height '5\' 8"'  (1.727 m), weight 172 lb (78 kg), SpO2 100 %.  ECOG PERFORMANCE STATUS: 1  Physical Exam  Constitutional: Oriented to person, place, and time and well-developed, well-nourished, and in no distress.   HENT:  Head: Normocephalic and atraumatic.  Mouth/Throat: Oropharynx is clear and moist. No oropharyngeal exudate.  Eyes: Conjunctivae are normal. Right eye exhibits no discharge. Left eye exhibits no discharge. No scleral icterus.  Neck: Normal range of motion. Neck supple.  Cardiovascular: Bradycardia, regular rhythm, normal heart sounds and intact distal pulses.   Pulmonary/Chest: Effort normal and breath sounds normal. No respiratory distress. No wheezes. No rales.  Abdominal: Soft. Bowel sounds are normal. Exhibits no distension and no mass. There is no tenderness.  Musculoskeletal: Normal range of motion. Exhibits no edema.  Lymphadenopathy:    No cervical adenopathy.  Neurological: Alert and oriented to person, place, and time. Exhibits normal muscle tone. Gait normal. Coordination normal.  Skin: Skin is warm and dry. No rash noted. Not diaphoretic. No erythema.  No pallor.  Psychiatric: Mood, memory and judgment normal.  Vitals reviewed.  LABORATORY DATA: Lab Results  Component Value Date   WBC 6.7 01/27/2021   HGB 13.0 01/27/2021   HCT 38.1 (L) 01/27/2021   MCV 97.7 01/27/2021   PLT 149 (L) 01/27/2021      Chemistry      Component Value Date/Time   NA 140 01/27/2021 1026   K 4.7 01/27/2021 1026   CL 108 01/27/2021 1026   CO2 26 01/27/2021 1026   BUN 15 01/27/2021 1026   CREATININE 1.37 (H) 01/27/2021 1026      Component Value Date/Time   CALCIUM 9.3 01/27/2021 1026   ALKPHOS 91 01/27/2021 1026   AST 22 01/27/2021 1026   ALT 20 01/27/2021 1026   BILITOT 0.7 01/27/2021 1026       RADIOGRAPHIC STUDIES:  CT  Humerus Left W Contrast  Result Date: 01/07/2021 CLINICAL DATA:  Metastatic disease evaluation Stage IV lung cancer with hx of pathological fracture and disease involvement of humerus. Increasing pain. EXAM: CT OF THE UPPER LEFT EXTREMITY WITH CONTRAST TECHNIQUE: Multidetector CT imaging of the upper left extremity was performed according to the standard protocol following intravenous contrast administration. CONTRAST:  124m OMNIPAQUE IOHEXOL 300 MG/ML  SOLN COMPARISON:  None. FINDINGS: Bones/Joint/Cartilage There is an old healed pathologic left humeral diaphyseal fracture from known underlying metastatic disease. There is no evidence of new fracture. There is no evidence of new bone destruction. There is screw fixation of the proximal ulna, partially visualized which appears intact. Mild glenohumeral and AC joint degenerative change. There is partially visualized radiocapitellar and ulnotrochlear joint degenerative change with large subchondral cyst formation in the capitellum. There is an ossified joint body just distal to the medial epicondyle. Ligaments Suboptimally assessed by CT. Muscles and Tendons No significant muscle atrophy. Soft tissues There is no soft tissue fluid collection. Partially visualized superior segment left lower lobe mass with post treatment changes, best described on recent chest CT on 11/26/2020. IMPRESSION: Old healed pathologic left humeral diaphyseal fracture with known underlying metastatic disease. No new acute fracture and no evidence of new destructive lesion. 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    He is status post palliative radiotherapy to the metastatic disease in the left arm under the care of Dr. KSondra Come This was completed in October 2020.   The patient is currently undergoing treatment with target therapy with Tagrisso 80 mg p.o. daily status post 21 months of treatment.   Labs were reviewed. Recommend that he continue on the same treatment at the same dose.   We will see him back for a follow up visit in 4 weeks for evaluation and repeat blood work and to review his scan results. I will arrange for a restaging CT scan of the chest, abdomen, and pelvis prior to his next follow up appointment.   He will continue take eliquis for the recent PE in May 2022.     He will continue to take imodium if needed for diarrhea which occurs intermittently and  can be caused by Tagrisso.  The patient was advised to call immediately if he has any concerning symptoms in the interval. The patient voices understanding of current disease status and treatment options and is in agreement with the current care plan. All questions were answered. The patient knows to call the clinic with any problems, questions or concerns. We can certainly see the patient much sooner if necessary        Orders Placed This Encounter  Procedures   CT Chest W Contrast    Standing Status:   Future    Standing Expiration Date:   01/27/2022    Order Specific Question:   If indicated for the ordered procedure, I authorize the administration of contrast media per Radiology protocol    Answer:   Yes    Order Specific Question:   Preferred imaging location?    Answer:   [facility]   CT Abdomen Pelvis W Contrast    Standing Status:   Future    Standing Expiration Date:   01/27/2022    Order Specific Question:   If indicated for the ordered procedure, I authorize the administration of contrast media per Radiology  protocol    Answer:   Yes    Order Specific Question:   Preferred imaging location?    Answer:   [facility]    Order Specific Question:   Is Oral Contrast requested for this exam?    Answer:   Yes, Per Radiology protocol   CBC with Differential ([place] Only)    Standing Status:   Future    Standing Expiration Date:   01/27/2022   CMP ([place] only)    Standing Status:   Future    Standing Expiration Date:   01/27/2022      The total time spent in the appointment was 20-29 minutes in this encounter.   Mercedes Fort L Noam Franzen, PA-C 01/27/21

## 2021-01-27 ENCOUNTER — Inpatient Hospital Stay: Payer: Medicare Other | Attending: Internal Medicine

## 2021-01-27 ENCOUNTER — Inpatient Hospital Stay: Payer: Medicare Other | Admitting: Physician Assistant

## 2021-01-27 ENCOUNTER — Other Ambulatory Visit: Payer: Self-pay

## 2021-01-27 VITALS — BP 151/57 | HR 46 | Temp 98.3°F | Resp 17 | Ht 68.0 in | Wt 172.0 lb

## 2021-01-27 DIAGNOSIS — C3492 Malignant neoplasm of unspecified part of left bronchus or lung: Secondary | ICD-10-CM | POA: Diagnosis not present

## 2021-01-27 DIAGNOSIS — Z86718 Personal history of other venous thrombosis and embolism: Secondary | ICD-10-CM | POA: Diagnosis not present

## 2021-01-27 DIAGNOSIS — C3432 Malignant neoplasm of lower lobe, left bronchus or lung: Secondary | ICD-10-CM | POA: Diagnosis not present

## 2021-01-27 DIAGNOSIS — Z79899 Other long term (current) drug therapy: Secondary | ICD-10-CM | POA: Diagnosis not present

## 2021-01-27 DIAGNOSIS — G4730 Sleep apnea, unspecified: Secondary | ICD-10-CM | POA: Diagnosis not present

## 2021-01-27 DIAGNOSIS — I4891 Unspecified atrial fibrillation: Secondary | ICD-10-CM | POA: Diagnosis not present

## 2021-01-27 DIAGNOSIS — Z8673 Personal history of transient ischemic attack (TIA), and cerebral infarction without residual deficits: Secondary | ICD-10-CM | POA: Insufficient documentation

## 2021-01-27 DIAGNOSIS — C7951 Secondary malignant neoplasm of bone: Secondary | ICD-10-CM | POA: Diagnosis not present

## 2021-01-27 DIAGNOSIS — Z86711 Personal history of pulmonary embolism: Secondary | ICD-10-CM | POA: Insufficient documentation

## 2021-01-27 LAB — CBC WITH DIFFERENTIAL (CANCER CENTER ONLY)
Abs Immature Granulocytes: 0.01 10*3/uL (ref 0.00–0.07)
Basophils Absolute: 0 10*3/uL (ref 0.0–0.1)
Basophils Relative: 0 %
Eosinophils Absolute: 0.1 10*3/uL (ref 0.0–0.5)
Eosinophils Relative: 2 %
HCT: 38.1 % — ABNORMAL LOW (ref 39.0–52.0)
Hemoglobin: 13 g/dL (ref 13.0–17.0)
Immature Granulocytes: 0 %
Lymphocytes Relative: 20 %
Lymphs Abs: 1.4 10*3/uL (ref 0.7–4.0)
MCH: 33.3 pg (ref 26.0–34.0)
MCHC: 34.1 g/dL (ref 30.0–36.0)
MCV: 97.7 fL (ref 80.0–100.0)
Monocytes Absolute: 0.7 10*3/uL (ref 0.1–1.0)
Monocytes Relative: 10 %
Neutro Abs: 4.5 10*3/uL (ref 1.7–7.7)
Neutrophils Relative %: 68 %
Platelet Count: 149 10*3/uL — ABNORMAL LOW (ref 150–400)
RBC: 3.9 MIL/uL — ABNORMAL LOW (ref 4.22–5.81)
RDW: 14.1 % (ref 11.5–15.5)
WBC Count: 6.7 10*3/uL (ref 4.0–10.5)
nRBC: 0 % (ref 0.0–0.2)

## 2021-01-27 LAB — CMP (CANCER CENTER ONLY)
ALT: 20 U/L (ref 0–44)
AST: 22 U/L (ref 15–41)
Albumin: 3.9 g/dL (ref 3.5–5.0)
Alkaline Phosphatase: 91 U/L (ref 38–126)
Anion gap: 6 (ref 5–15)
BUN: 15 mg/dL (ref 8–23)
CO2: 26 mmol/L (ref 22–32)
Calcium: 9.3 mg/dL (ref 8.9–10.3)
Chloride: 108 mmol/L (ref 98–111)
Creatinine: 1.37 mg/dL — ABNORMAL HIGH (ref 0.61–1.24)
GFR, Estimated: 55 mL/min — ABNORMAL LOW (ref >60)
Glucose, Bld: 94 mg/dL (ref 70–99)
Potassium: 4.7 mmol/L (ref 3.5–5.1)
Sodium: 140 mmol/L (ref 135–145)
Total Bilirubin: 0.7 mg/dL (ref 0.3–1.2)
Total Protein: 6.9 g/dL (ref 6.5–8.1)

## 2021-02-23 ENCOUNTER — Ambulatory Visit (HOSPITAL_COMMUNITY)
Admission: RE | Admit: 2021-02-23 | Discharge: 2021-02-23 | Disposition: A | Discharge status: Home / Self Care | Payer: Medicare Other | Source: Ambulatory Visit | Attending: Physician Assistant | Admitting: Physician Assistant

## 2021-02-23 ENCOUNTER — Other Ambulatory Visit: Payer: Self-pay

## 2021-02-23 DIAGNOSIS — C3490 Malignant neoplasm of unspecified part of unspecified bronchus or lung: Secondary | ICD-10-CM | POA: Diagnosis not present

## 2021-02-23 DIAGNOSIS — I700 Atherosclerosis of aorta: Secondary | ICD-10-CM | POA: Diagnosis not present

## 2021-02-23 DIAGNOSIS — C3492 Malignant neoplasm of unspecified part of left bronchus or lung: Secondary | ICD-10-CM | POA: Diagnosis not present

## 2021-02-23 DIAGNOSIS — N281 Cyst of kidney, acquired: Secondary | ICD-10-CM | POA: Diagnosis not present

## 2021-02-23 DIAGNOSIS — R918 Other nonspecific abnormal finding of lung field: Secondary | ICD-10-CM | POA: Diagnosis not present

## 2021-02-23 DIAGNOSIS — R911 Solitary pulmonary nodule: Secondary | ICD-10-CM | POA: Diagnosis not present

## 2021-02-23 DIAGNOSIS — C7951 Secondary malignant neoplasm of bone: Secondary | ICD-10-CM | POA: Diagnosis not present

## 2021-02-23 MED ORDER — IOHEXOL 350 MG/ML SOLN
80.0000 mL | Freq: Once | INTRAVENOUS | Status: AC | PRN
  Administered 2021-02-23: 80 mL via INTRAVENOUS

## 2021-02-24 ENCOUNTER — Inpatient Hospital Stay: Payer: Medicare Other

## 2021-02-24 ENCOUNTER — Inpatient Hospital Stay: Payer: Medicare Other | Attending: Internal Medicine | Admitting: Internal Medicine

## 2021-02-24 VITALS — BP 128/62 | HR 63 | Temp 97.6°F | Resp 18 | Ht 68.0 in | Wt 177.2 lb

## 2021-02-24 DIAGNOSIS — C7931 Secondary malignant neoplasm of brain: Secondary | ICD-10-CM | POA: Diagnosis not present

## 2021-02-24 DIAGNOSIS — C3492 Malignant neoplasm of unspecified part of left bronchus or lung: Secondary | ICD-10-CM | POA: Diagnosis not present

## 2021-02-24 DIAGNOSIS — I10 Essential (primary) hypertension: Secondary | ICD-10-CM | POA: Insufficient documentation

## 2021-02-24 DIAGNOSIS — Z86718 Personal history of other venous thrombosis and embolism: Secondary | ICD-10-CM | POA: Insufficient documentation

## 2021-02-24 DIAGNOSIS — C3432 Malignant neoplasm of lower lobe, left bronchus or lung: Secondary | ICD-10-CM | POA: Insufficient documentation

## 2021-02-24 DIAGNOSIS — Z85820 Personal history of malignant melanoma of skin: Secondary | ICD-10-CM | POA: Insufficient documentation

## 2021-02-24 DIAGNOSIS — Z8673 Personal history of transient ischemic attack (TIA), and cerebral infarction without residual deficits: Secondary | ICD-10-CM | POA: Insufficient documentation

## 2021-02-24 DIAGNOSIS — Z7901 Long term (current) use of anticoagulants: Secondary | ICD-10-CM | POA: Diagnosis not present

## 2021-02-24 DIAGNOSIS — C7951 Secondary malignant neoplasm of bone: Secondary | ICD-10-CM | POA: Insufficient documentation

## 2021-02-24 DIAGNOSIS — Z79899 Other long term (current) drug therapy: Secondary | ICD-10-CM | POA: Insufficient documentation

## 2021-02-24 DIAGNOSIS — I4891 Unspecified atrial fibrillation: Secondary | ICD-10-CM | POA: Insufficient documentation

## 2021-02-24 DIAGNOSIS — G4730 Sleep apnea, unspecified: Secondary | ICD-10-CM | POA: Insufficient documentation

## 2021-02-24 LAB — CMP (CANCER CENTER ONLY)
ALT: 23 U/L (ref 0–44)
AST: 23 U/L (ref 15–41)
Albumin: 3.6 g/dL (ref 3.5–5.0)
Alkaline Phosphatase: 99 U/L (ref 38–126)
Anion gap: 7 (ref 5–15)
BUN: 15 mg/dL (ref 8–23)
CO2: 28 mmol/L (ref 22–32)
Calcium: 9.1 mg/dL (ref 8.9–10.3)
Chloride: 107 mmol/L (ref 98–111)
Creatinine: 1.43 mg/dL — ABNORMAL HIGH (ref 0.61–1.24)
GFR, Estimated: 53 mL/min — ABNORMAL LOW (ref >60)
Glucose, Bld: 109 mg/dL — ABNORMAL HIGH (ref 70–99)
Potassium: 4.2 mmol/L (ref 3.5–5.1)
Sodium: 142 mmol/L (ref 135–145)
Total Bilirubin: 0.7 mg/dL (ref 0.3–1.2)
Total Protein: 6.7 g/dL (ref 6.5–8.1)

## 2021-02-24 LAB — CBC WITH DIFFERENTIAL (CANCER CENTER ONLY)
Abs Immature Granulocytes: 0.02 10*3/uL (ref 0.00–0.07)
Basophils Absolute: 0 10*3/uL (ref 0.0–0.1)
Basophils Relative: 1 %
Eosinophils Absolute: 0.1 10*3/uL (ref 0.0–0.5)
Eosinophils Relative: 2 %
HCT: 38.3 % — ABNORMAL LOW (ref 39.0–52.0)
Hemoglobin: 12.7 g/dL — ABNORMAL LOW (ref 13.0–17.0)
Immature Granulocytes: 0 %
Lymphocytes Relative: 24 %
Lymphs Abs: 1.7 10*3/uL (ref 0.7–4.0)
MCH: 31.9 pg (ref 26.0–34.0)
MCHC: 33.2 g/dL (ref 30.0–36.0)
MCV: 96.2 fL (ref 80.0–100.0)
Monocytes Absolute: 0.7 10*3/uL (ref 0.1–1.0)
Monocytes Relative: 10 %
Neutro Abs: 4.4 10*3/uL (ref 1.7–7.7)
Neutrophils Relative %: 63 %
Platelet Count: 176 10*3/uL (ref 150–400)
RBC: 3.98 MIL/uL — ABNORMAL LOW (ref 4.22–5.81)
RDW: 13.4 % (ref 11.5–15.5)
WBC Count: 6.9 10*3/uL (ref 4.0–10.5)
nRBC: 0 % (ref 0.0–0.2)

## 2021-02-24 NOTE — Progress Notes (Signed)
[place] [phone]   [phone]  OFFICE PROGRESS NOTE  Aura Dials, MD [place] [address]  DIAGNOSIS: Stage IV (T3, N0, M1c) non-small cell lung cancer, adenocarcinoma presented with cavitary left lower lobe lung mass in addition to other pulmonary nodules and scattered hypermetabolic osseous metastatic disease involving the spine, ribs and bony pelvis as well as left proximal humeral pathologic fracture.    Molecular Biomarkers performed by [employer] DETECTED ALTERATION(S) / [name]      [id number]  Afatinib, Dacomitinib, Erlotinib, Gefitinib, Osimertinib, Ramucirumab Neratinib   FGFR1Amplification  Erdafitinib, Lenvatinib, Nintedanib, Pazopanib, Pemigatinib, Ponatinib   [id number] None   PRIOR THERAPY: None   CURRENT THERAPY:  1) Palliative radiotherapy to the the left humerus under the care of Dr. Sondra Come. Last treatment on 04/24/2019 2) Targeted therapy with Tagrisso 80 p.o. daily.  He started the first dose on April 21, 2019.  Status post 22 months of treatment.  INTERVAL HISTORY: Albert Perez 70 y.o. male returns to the clinic today for follow-up visit.  The patient is feeling fine today with no concerning complaints except for mild fatigue.  He denied having any skin rash or diarrhea.  He has no chest pain, shortness of breath except with exertion with no cough or hemoptysis.  He denied having any fever or chills.  He has no nausea, vomiting, abdominal pain, diarrhea or constipation.  He denied having any recent weight loss or night sweats.  He has no headache or visual changes.  He continues to tolerate his treatment with Tagrisso fairly well.  He had repeat CT scan of the chest, abdomen pelvis performed recently and he is here for evaluation and discussion of his discuss results.  MEDICAL HISTORY: Past Medical History:  Diagnosis Date   A-fib [facility])    PMH: 2000-2001   Arthritis     Cancer ([place])    melanoma 2018   Diarrhea    due to medications   DVT (deep venous thrombosis) (HCC)    Left brachial vein DVT (01/30/19, following fracture)   Dysrhythmia    afib -    ETOH abuse    GERD (gastroesophageal reflux disease)    ocassional   Lung cancer ([place]) dx'd 02/26/2019   Metastatic cancer to bone ([place]) dx'd 02/26/2019   Sleep apnea    do not wear CPAP   Stroke ([place]) 2019   " mini"   Vertigo    Wears glasses     ALLERGIES:  has No Known Allergies.  MEDICATIONS:  Current Outpatient Medications  Medication Sig Dispense Refill   acetaminophen (TYLENOL) 650 MG CR tablet Take 650 mg by mouth every 8 (eight) hours as needed for pain.     apixaban (ELIQUIS) 5 MG TABS tablet Take 1 tablet (5 mg total) by mouth 2 (two) times daily. 60 tablet 2   buPROPion (WELLBUTRIN XL) 150 MG 24 hr tablet Take 150 mg by mouth daily as needed (nicotine cravings).     carvedilol (COREG) 6.25 MG tablet TAKE 1 TABLET BY MOUTH TWICE A DAY (Patient taking differently: Take 6.25 mg by mouth 2 (two) times daily with a meal.) 180 tablet 3   loperamide (IMODIUM) 2 MG capsule Take 2 mg by mouth as needed for diarrhea or loose stools.     Multiple Vitamin (MULTIVITAMIN PO) Take 1 tablet by mouth daily. Centrum     Omega-3 Fatty Acids (OMEGA-3 PO) Take 1 capsule by mouth  daily.     osimertinib mesylate (TAGRISSO) 80 MG tablet Take 1 tablet (80 mg total) by mouth daily. 30 tablet 2   OVER THE COUNTER MEDICATION Take 1 tablet by mouth daily. Standard Process, LivaPlex (Liver supplement)     rosuvastatin (CRESTOR) 5 MG tablet Take 5 mg by mouth daily.  2   TURMERIC PO Take 1 capsule by mouth daily.     vitamin B-12 (CYANOCOBALAMIN) 1000 MCG tablet Take 1,000 mcg by mouth daily.     No current facility-administered medications for this visit.    SURGICAL HISTORY:  Past Surgical History:  Procedure Laterality Date   ANKLE FRACTURE SURGERY Right 2003   COLONOSCOPY  2018   ELBOW SURGERY Left 1964    INGUINAL HERNIA REPAIR Bilateral 11/12/2020   Procedure: BILATERAL OPEN INGUINAL HERNIA REPAIR WITH MESH;  Surgeon: Coralie Keens, MD;  Location: [facility];  Service: General;  Laterality: Bilateral;   melanoma removal  04/2017   Lt shoulder   RETINAL DETACHMENT SURGERY Right 1971   TONSILLECTOMY     VIDEO BRONCHOSCOPY WITH ENDOBRONCHIAL NAVIGATION N/A 03/28/2019   Procedure: VIDEO BRONCHOSCOPY WITH ENDOBRONCHIAL NAVIGATION and Biopsies;  Surgeon: Collene Gobble, MD;  [facility];  Service: Thoracic;  Laterality: N/A;    REVIEW OF SYSTEMS:  Constitutional: positive for fatigue Eyes: negative Ears, nose, mouth, throat, and face: negative Respiratory: negative Cardiovascular: negative Gastrointestinal: negative Genitourinary:negative Integument/breast: negative Hematologic/lymphatic: negative Musculoskeletal:negative Neurological: negative Behavioral/Psych: negative Endocrine: negative Allergic/Immunologic: negative   PHYSICAL EXAMINATION: General appearance: alert, cooperative, and no distress Head: Normocephalic, without obvious abnormality, atraumatic Neck: no adenopathy, no JVD, supple, symmetrical, trachea midline, and thyroid not enlarged, symmetric, no tenderness/mass/nodules Lymph nodes: Cervical, supraclavicular, and axillary nodes normal. Resp: clear to auscultation bilaterally Back: symmetric, no curvature. ROM normal. No CVA tenderness. Cardio: regular rate and rhythm, S1, S2 normal, no murmur, click, rub or gallop GI: soft, non-tender; bowel sounds normal; no masses,  no organomegaly Extremities: extremities normal, atraumatic, no cyanosis or edema Neurologic: Alert and oriented X 3, normal strength and tone. Normal symmetric reflexes. Normal coordination and gait   ECOG PERFORMANCE STATUS: 1 - Symptomatic but completely ambulatory  Blood pressure 128/62, pulse 63, temperature 97.6 F (36.4 C), temperature source Tympanic, resp. rate 18, height _0  (1.727 m),  weight 177 lb 3.2 oz (80.4 kg), SpO2 100 %.  LABORATORY DATA: Lab Results  Component Value Date   WBC 6.9 02/24/2021   HGB 12.7 (L) 02/24/2021   HCT 38.3 (L) 02/24/2021   MCV 96.2 02/24/2021   PLT 176 02/24/2021      Chemistry      Component Value Date/Time   NA 142 02/24/2021 1513   K 4.2 02/24/2021 1513   CL 107 02/24/2021 1513   CO2 28 02/24/2021 1513   BUN 15 02/24/2021 1513   CREATININE 1.43 (H) 02/24/2021 1513      Component Value Date/Time   CALCIUM 9.1 02/24/2021 1513   ALKPHOS 99 02/24/2021 1513   AST 23 02/24/2021 1513   ALT 23 02/24/2021 1513   BILITOT 0.7 02/24/2021 1513       RADIOGRAPHIC STUDIES: CT Chest W Contrast  Result Date: 02/24/2021 CLINICAL DATA:  Non-small cell lung cancer. Metastatic disease remains/pons treatment. Stage IV non-small cell lung cancer. Hypertension LEFT lower lung mass. Osseous metastasis. EXAM: CT CHEST, ABDOMEN, AND PELVIS WITH CONTRAST TECHNIQUE: Multidetector CT imaging of the chest, abdomen and pelvis was performed following the standard protocol during bolus administration of intravenous contrast. CONTRAST:  45mL OMNIPAQUE IOHEXOL 350 MG/ML SOLN COMPARISON:  None. FINDINGS: CT CHEST FINDINGS Cardiovascular: Coronary artery calcification and aortic atherosclerotic calcification. Apparent resorption of the thromboemboli identified on comparison exam. Mediastinum/Nodes: No axillary or supraclavicular adenopathy. No mediastinal or hilar adenopathy. No pericardial fluid. Esophagus normal. Lungs/Pleura: Noncalcified nodule in the azygoesophageal recess measuring 6 mm (image 114/4) is not changed. Ovoid consolidation along the LEFT fissure within the superior segment LEFT lower lobe measures 2.7 x 1.9 cm compared to 3 point 2 x 1.8 cm. Visually lesion very similar. No new pulmonary nodules. A small angular nodule in lingula (image 76) is unchanged. Musculoskeletal: Stable sclerotic lesions in the spine CT ABDOMEN AND PELVIS FINDINGS  Hepatobiliary: No focal hepatic lesion. No biliary ductal dilatation. Gallbladder is normal. Common bile duct is normal. Pancreas: Pancreas is normal. No ductal dilatation. No pancreatic inflammation. Spleen: Normal spleen Adrenals/urinary tract: Adrenal glands normal. Benign cyst of the RIGHT kidney. Ureters and bladder normal. Stomach/Bowel: Stomach, small bowel, appendix, and cecum are normal. The colon and rectosigmoid colon are normal. Vascular/Lymphatic: Calcification abdominal aorta. There are several small periaortic lymph nodes are not pathologic by size criteria not changed from comparison exam. No iliac adenopathy. Reproductive: Unremarkable Other: No free fluid. Musculoskeletal: No aggressive osseous lesion. IMPRESSION: Chest Impression: 1. Stable ovoid nodular consolidation in the LEFT lower lobe. 2. Stable smaller pulmonary nodules. 3. No new nodularity in the lungs. 4. No lymphadenopathy. 5. Coronary artery calcification and Aortic Atherosclerosis (ICD10-I70.0). Abdomen / Pelvis Impression: 1. No evidence of soft tissue metastasis in the abdomen pelvis. 2. Stable sclerotic metastatic lesions in the spine. Electronically Signed   By: Suzy Bouchard M.D.   On: 02/24/2021 14:43   CT Abdomen Pelvis W Contrast  Result Date: 02/24/2021 CLINICAL DATA:  Non-small cell lung cancer. Metastatic disease remains/pons treatment. Stage IV non-small cell lung cancer. Hypertension LEFT lower lung mass. Osseous metastasis. EXAM: CT CHEST, ABDOMEN, AND PELVIS WITH CONTRAST TECHNIQUE: Multidetector CT imaging of the chest, abdomen and pelvis was performed following the standard protocol during bolus administration of intravenous contrast. CONTRAST:  7mL OMNIPAQUE IOHEXOL 350 MG/ML SOLN COMPARISON:  None. FINDINGS: CT CHEST FINDINGS Cardiovascular: Coronary artery calcification and aortic atherosclerotic calcification. Apparent resorption of the thromboemboli identified on comparison exam. Mediastinum/Nodes: No  axillary or supraclavicular adenopathy. No mediastinal or hilar adenopathy. No pericardial fluid. Esophagus normal. Lungs/Pleura: Noncalcified nodule in the azygoesophageal recess measuring 6 mm (image 114/4) is not changed. Ovoid consolidation along the LEFT fissure within the superior segment LEFT lower lobe measures 2.7 x 1.9 cm compared to 3 point 2 x 1.8 cm. Visually lesion very similar. No new pulmonary nodules. A small angular nodule in lingula (image 76) is unchanged. Musculoskeletal: Stable sclerotic lesions in the spine CT ABDOMEN AND PELVIS FINDINGS Hepatobiliary: No focal hepatic lesion. No biliary ductal dilatation. Gallbladder is normal. Common bile duct is normal. Pancreas: Pancreas is normal. No ductal dilatation. No pancreatic inflammation. Spleen: Normal spleen Adrenals/urinary tract: Adrenal glands normal. Benign cyst of the RIGHT kidney. Ureters and bladder normal. Stomach/Bowel: Stomach, small bowel, appendix, and cecum are normal. The colon and rectosigmoid colon are normal. Vascular/Lymphatic: Calcification abdominal aorta. There are several small periaortic lymph nodes are not pathologic by size criteria not changed from comparison exam. No iliac adenopathy. Reproductive: Unremarkable Other: No free fluid. Musculoskeletal: No aggressive osseous lesion. IMPRESSION: Chest Impression: 1. Stable ovoid nodular consolidation in the LEFT lower lobe. 2. Stable smaller pulmonary nodules. 3. No new nodularity in the lungs. 4. No lymphadenopathy. 5. Coronary  artery calcification and Aortic Atherosclerosis (ICD10-I70.0). Abdomen / Pelvis Impression: 1. No evidence of soft tissue metastasis in the abdomen pelvis. 2. Stable sclerotic metastatic lesions in the spine. Electronically Signed   By: Suzy Bouchard M.D.   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  He is status post palliative radiotherapy to the metastatic disease in the left arm. The patient is currently undergoing treatment with target therapy with Tagrisso 80 mg p.o. daily started 22 months ago. The patient has been tolerating this treatment well with no concerning adverse effect except for mild fatigue. He had repeat CT scan of the chest, abdomen pelvis performed recently.  I personally and independently reviewed the scans and discussed the results with the patient today. His scan showed no concerning findings for disease progression. I recommended for him to continue his current treatment with Tagrisso with the same dose. I will see him back for follow-up visit in 2 months for evaluation and repeat blood work. The patient was advised to call immediately if he has any other concerning symptoms in the interval. The patient voices understanding of current disease status and treatment options and is in agreement with the current care plan. All questions were answered. The patient knows to call the clinic with any problems, questions or concerns. We can certainly see the patient much sooner if necessary. The total time spent in the appointment was 30 minutes.   Disclaimer: This note was dictated with voice recognition software. Similar sounding words can inadvertently be transcribed and may not be corrected upon review.

## 2021-02-26 ENCOUNTER — Encounter: Payer: Self-pay | Admitting: Internal Medicine

## 2021-02-27 ENCOUNTER — Other Ambulatory Visit: Payer: Self-pay | Admitting: Internal Medicine

## 2021-02-27 DIAGNOSIS — C3492 Malignant neoplasm of unspecified part of left bronchus or lung: Secondary | ICD-10-CM

## 2021-02-27 MED ORDER — OSIMERTINIB MESYLATE 80 MG PO TABS: 80.0000 mg | ORAL_TABLET | Freq: Every day | ORAL | 2 refills | Status: DC

## 2021-03-10 ENCOUNTER — Telehealth: Payer: Self-pay

## 2021-03-10 NOTE — Telephone Encounter (Signed)
Pt LM advising he has not received his rx of Tagrisso.  Rx was sent to [facility] 02/27/21 with confirmation. This pts rx is paid for though [employer] who have experiencing a known electronic system migration delay which is affecting multiple pts. I have called the pt back and LM advising of this and for her to contact [employer] for an estimated time they will auth his rx.

## 2021-03-12 ENCOUNTER — Encounter: Payer: Self-pay | Admitting: Internal Medicine

## 2021-03-12 ENCOUNTER — Other Ambulatory Visit: Payer: Self-pay | Admitting: Medical Oncology

## 2021-03-12 DIAGNOSIS — C3492 Malignant neoplasm of unspecified part of left bronchus or lung: Secondary | ICD-10-CM

## 2021-03-12 MED ORDER — OSIMERTINIB MESYLATE 80 MG PO TABS: 80.0000 mg | ORAL_TABLET | Freq: Every day | ORAL | 2 refills | Status: DC

## 2021-03-12 NOTE — Progress Notes (Signed)
Tagrisso faxed to [facility] and [facility]

## 2021-03-13 ENCOUNTER — Other Ambulatory Visit: Payer: Self-pay

## 2021-03-13 DIAGNOSIS — I2692 Saddle embolus of pulmonary artery without acute cor pulmonale: Secondary | ICD-10-CM

## 2021-03-13 MED ORDER — APIXABAN 5 MG PO TABS: 5.0000 mg | ORAL_TABLET | Freq: Two times a day (BID) | ORAL | 2 refills | Status: DC

## 2021-03-13 NOTE — Telephone Encounter (Signed)
Fax received from [facility] requesting refill of Eliquis 5mg  tab.  Auth received from [facility] with two additional refills.

## 2021-03-21 ENCOUNTER — Encounter: Payer: Self-pay | Admitting: Internal Medicine

## 2021-03-23 DIAGNOSIS — C3432 Malignant neoplasm of lower lobe, left bronchus or lung: Secondary | ICD-10-CM | POA: Diagnosis not present

## 2021-03-23 DIAGNOSIS — E782 Mixed hyperlipidemia: Secondary | ICD-10-CM | POA: Diagnosis not present

## 2021-03-23 DIAGNOSIS — G8929 Other chronic pain: Secondary | ICD-10-CM | POA: Diagnosis not present

## 2021-03-23 DIAGNOSIS — M170 Bilateral primary osteoarthritis of knee: Secondary | ICD-10-CM | POA: Diagnosis not present

## 2021-03-23 DIAGNOSIS — I10 Essential (primary) hypertension: Secondary | ICD-10-CM | POA: Diagnosis not present

## 2021-04-27 ENCOUNTER — Encounter: Payer: Self-pay | Admitting: Internal Medicine

## 2021-04-27 ENCOUNTER — Other Ambulatory Visit: Payer: Self-pay

## 2021-04-27 ENCOUNTER — Inpatient Hospital Stay: Payer: Medicare Other

## 2021-04-27 ENCOUNTER — Inpatient Hospital Stay: Payer: Medicare Other | Attending: Internal Medicine | Admitting: Internal Medicine

## 2021-04-27 VITALS — BP 131/70 | HR 70 | Temp 97.6°F | Resp 18 | Ht 68.0 in | Wt 176.3 lb

## 2021-04-27 DIAGNOSIS — C3490 Malignant neoplasm of unspecified part of unspecified bronchus or lung: Secondary | ICD-10-CM

## 2021-04-27 DIAGNOSIS — I4891 Unspecified atrial fibrillation: Secondary | ICD-10-CM | POA: Diagnosis not present

## 2021-04-27 DIAGNOSIS — Z8673 Personal history of transient ischemic attack (TIA), and cerebral infarction without residual deficits: Secondary | ICD-10-CM | POA: Insufficient documentation

## 2021-04-27 DIAGNOSIS — Z7901 Long term (current) use of anticoagulants: Secondary | ICD-10-CM | POA: Insufficient documentation

## 2021-04-27 DIAGNOSIS — Z86718 Personal history of other venous thrombosis and embolism: Secondary | ICD-10-CM | POA: Diagnosis not present

## 2021-04-27 DIAGNOSIS — C3432 Malignant neoplasm of lower lobe, left bronchus or lung: Secondary | ICD-10-CM | POA: Diagnosis not present

## 2021-04-27 DIAGNOSIS — C7951 Secondary malignant neoplasm of bone: Secondary | ICD-10-CM | POA: Diagnosis not present

## 2021-04-27 DIAGNOSIS — Z79899 Other long term (current) drug therapy: Secondary | ICD-10-CM | POA: Insufficient documentation

## 2021-04-27 DIAGNOSIS — C3492 Malignant neoplasm of unspecified part of left bronchus or lung: Secondary | ICD-10-CM

## 2021-04-27 DIAGNOSIS — Z85820 Personal history of malignant melanoma of skin: Secondary | ICD-10-CM | POA: Diagnosis not present

## 2021-04-27 LAB — CMP (CANCER CENTER ONLY)
ALT: 21 U/L (ref 0–44)
AST: 22 U/L (ref 15–41)
Albumin: 3.9 g/dL (ref 3.5–5.0)
Alkaline Phosphatase: 102 U/L (ref 38–126)
Anion gap: 9 (ref 5–15)
BUN: 20 mg/dL (ref 8–23)
CO2: 24 mmol/L (ref 22–32)
Calcium: 9.4 mg/dL (ref 8.9–10.3)
Chloride: 107 mmol/L (ref 98–111)
Creatinine: 1.28 mg/dL — ABNORMAL HIGH (ref 0.61–1.24)
GFR, Estimated: 60 mL/min — ABNORMAL LOW (ref >60)
Glucose, Bld: 105 mg/dL — ABNORMAL HIGH (ref 70–99)
Potassium: 4.2 mmol/L (ref 3.5–5.1)
Sodium: 140 mmol/L (ref 135–145)
Total Bilirubin: 0.5 mg/dL (ref 0.3–1.2)
Total Protein: 7.1 g/dL (ref 6.5–8.1)

## 2021-04-27 LAB — CBC WITH DIFFERENTIAL (CANCER CENTER ONLY)
Abs Immature Granulocytes: 0.01 10*3/uL (ref 0.00–0.07)
Basophils Absolute: 0 10*3/uL (ref 0.0–0.1)
Basophils Relative: 1 %
Eosinophils Absolute: 0.1 10*3/uL (ref 0.0–0.5)
Eosinophils Relative: 1 %
HCT: 40.4 % (ref 39.0–52.0)
Hemoglobin: 13.5 g/dL (ref 13.0–17.0)
Immature Granulocytes: 0 %
Lymphocytes Relative: 31 %
Lymphs Abs: 1.8 10*3/uL (ref 0.7–4.0)
MCH: 32.1 pg (ref 26.0–34.0)
MCHC: 33.4 g/dL (ref 30.0–36.0)
MCV: 96.2 fL (ref 80.0–100.0)
Monocytes Absolute: 0.7 10*3/uL (ref 0.1–1.0)
Monocytes Relative: 12 %
Neutro Abs: 3.1 10*3/uL (ref 1.7–7.7)
Neutrophils Relative %: 55 %
Platelet Count: 157 10*3/uL (ref 150–400)
RBC: 4.2 MIL/uL — ABNORMAL LOW (ref 4.22–5.81)
RDW: 13.8 % (ref 11.5–15.5)
WBC Count: 5.6 10*3/uL (ref 4.0–10.5)
nRBC: 0 % (ref 0.0–0.2)

## 2021-04-27 NOTE — Progress Notes (Signed)
[place] [phone]   [phone]  OFFICE PROGRESS NOTE  Aura Dials, MD [place] [address]  DIAGNOSIS: Stage IV (T3, N0, M1c) non-small cell lung cancer, adenocarcinoma presented with cavitary left lower lobe lung mass in addition to other pulmonary nodules and scattered hypermetabolic osseous metastatic disease involving the spine, ribs and bony pelvis as well as left proximal humeral pathologic fracture.    Molecular Biomarkers performed by [employer] DETECTED ALTERATION(S) / [name]      [id number]  Afatinib, Dacomitinib, Erlotinib, Gefitinib, Osimertinib, Ramucirumab Neratinib   FGFR1Amplification  Erdafitinib, Lenvatinib, Nintedanib, Pazopanib, Pemigatinib, Ponatinib   [id number] None   PRIOR THERAPY: None   CURRENT THERAPY:  1) Palliative radiotherapy to the the left humerus under the care of Dr. Sondra Come. Last treatment on 04/24/2019 2) Targeted therapy with Tagrisso 80 p.o. daily.  He started the first dose on April 21, 2019.  Status post 24 months of treatment.  INTERVAL HISTORY: Albert Perez 70 y.o. male returns to the clinic today for follow-up visit.  The patient is feeling fine today with no concerning complaints.  He denied having any current chest pain, shortness of breath, cough or hemoptysis.  He has no nausea, vomiting, diarrhea or constipation.  He denied having any headache or visual changes.  He is here today for evaluation and repeat blood work.  MEDICAL HISTORY: Past Medical History:  Diagnosis Date   A-fib [facility])    PMH: 2000-2001   Arthritis    Cancer ([place])    melanoma 2018   Diarrhea    due to medications   DVT (deep venous thrombosis) (HCC)    Left brachial vein DVT (01/30/19, following fracture)   Dysrhythmia    afib -    ETOH abuse    GERD (gastroesophageal reflux disease)    ocassional   Lung cancer ([place]) dx'd 02/26/2019   Metastatic cancer to bone ([place])  dx'd 02/26/2019   Sleep apnea    do not wear CPAP   Stroke ([place]) 2019   " mini"   Vertigo    Wears glasses     ALLERGIES:  has No Known Allergies.  MEDICATIONS:  Current Outpatient Medications  Medication Sig Dispense Refill   acetaminophen (TYLENOL) 650 MG CR tablet Take 650 mg by mouth every 8 (eight) hours as needed for pain.     apixaban (ELIQUIS) 5 MG TABS tablet Take 1 tablet (5 mg total) by mouth 2 (two) times daily. 60 tablet 2   buPROPion (WELLBUTRIN XL) 150 MG 24 hr tablet Take 150 mg by mouth daily as needed (nicotine cravings).     carvedilol (COREG) 6.25 MG tablet TAKE 1 TABLET BY MOUTH TWICE A DAY (Patient taking differently: Take 6.25 mg by mouth 2 (two) times daily with a meal.) 180 tablet 3   loperamide (IMODIUM) 2 MG capsule Take 2 mg by mouth as needed for diarrhea or loose stools.     Multiple Vitamin (MULTIVITAMIN PO) Take 1 tablet by mouth daily. Centrum     Omega-3 Fatty Acids (OMEGA-3 PO) Take 1 capsule by mouth daily.     osimertinib mesylate (TAGRISSO) 80 MG tablet Take 1 tablet (80 mg total) by mouth daily. 30 tablet 2   OVER THE COUNTER MEDICATION Take 1 tablet by mouth daily. Standard Process, LivaPlex (Liver supplement)     rosuvastatin (CRESTOR) 5 MG tablet Take 5 mg by mouth daily.  2   TURMERIC PO  Take 1 capsule by mouth daily.     vitamin B-12 (CYANOCOBALAMIN) 1000 MCG tablet Take 1,000 mcg by mouth daily.     No current facility-administered medications for this visit.    SURGICAL HISTORY:  Past Surgical History:  Procedure Laterality Date   ANKLE FRACTURE SURGERY Right 2003   COLONOSCOPY  2018   ELBOW SURGERY Left 1964   INGUINAL HERNIA REPAIR Bilateral 11/12/2020   Procedure: BILATERAL OPEN INGUINAL HERNIA REPAIR WITH MESH;  Surgeon: Coralie Keens, MD;  Location: [facility];  Service: General;  Laterality: Bilateral;   melanoma removal  04/2017   Lt shoulder   RETINAL DETACHMENT SURGERY Right 1971   TONSILLECTOMY     VIDEO BRONCHOSCOPY WITH  ENDOBRONCHIAL NAVIGATION N/A 03/28/2019   Procedure: VIDEO BRONCHOSCOPY WITH ENDOBRONCHIAL NAVIGATION and Biopsies;  Surgeon: Collene Gobble, MD;  [facility];  Service: Thoracic;  Laterality: N/A;    REVIEW OF SYSTEMS:  A comprehensive review of systems was negative except for: Constitutional: positive for fatigue   PHYSICAL EXAMINATION: General appearance: alert, cooperative, and no distress Head: Normocephalic, without obvious abnormality, atraumatic Neck: no adenopathy, no JVD, supple, symmetrical, trachea midline, and thyroid not enlarged, symmetric, no tenderness/mass/nodules Lymph nodes: Cervical, supraclavicular, and axillary nodes normal. Resp: clear to auscultation bilaterally Back: symmetric, no curvature. ROM normal. No CVA tenderness. Cardio: regular rate and rhythm, S1, S2 normal, no murmur, click, rub or gallop GI: soft, non-tender; bowel sounds normal; no masses,  no organomegaly Extremities: extremities normal, atraumatic, no cyanosis or edema   ECOG PERFORMANCE STATUS: 1 - Symptomatic but completely ambulatory  Blood pressure 131/70, pulse 70, temperature 97.6 F (36.4 C), temperature source Tympanic, resp. rate 18, height '5\' 8"'  (1.727 m), weight 176 lb 4.8 oz (80 kg), SpO2 99 %.  LABORATORY DATA: Lab Results  Component Value Date   WBC 5.6 04/27/2021   HGB 13.5 04/27/2021   HCT 40.4 04/27/2021   MCV 96.2 04/27/2021   PLT 157 04/27/2021      Chemistry      Component Value Date/Time   NA 142 02/24/2021 1513   K 4.2 02/24/2021 1513   CL 107 02/24/2021 1513   CO2 28 02/24/2021 1513   BUN 15 02/24/2021 1513   CREATININE 1.43 (H) 02/24/2021 1513      Component Value Date/Time   CALCIUM 9.1 02/24/2021 1513   ALKPHOS 99 02/24/2021 1513   AST 23 02/24/2021 1513   ALT 23 02/24/2021 1513   BILITOT 0.7 02/24/2021 1513       RADIOGRAPHIC STUDIES: No results found.  ASSESSMENT AND PLAN: This is a very pleasant 70 years old white male with a stage IV  non-small cell lung cancer, adenocarcinoma with positive EGFR mutation with deletion in exon 19 presented with cavitary left lower lobe lung mass in addition to other pulmonary nodules and scattered hypermetabolic osseous metastatic disease involving the spines, ribs and bony pelvis with left proximal humeral pathologic fracture diagnosed in September 2020.  He is status post palliative radiotherapy to the metastatic disease in the left arm. The patient is currently undergoing treatment with target therapy with Tagrisso 80 mg p.o. daily started 24 months ago. The patient is feeling fine today with no concerning complaints.  He continues to tolerate his treatment with Tagrisso fairly well. I recommended for him to continue his current treatment to [place] with the same dose. I will see him back for follow-up visit in 2 months for evaluation with repeat CT scan of the chest, abdomen  and pelvis for restaging of his disease. The patient was advised to call immediately if he has any concerning symptoms in the interval. The patient voices understanding of current disease status and treatment options and is in agreement with the current care plan. All questions were answered. The patient knows to call the clinic with any problems, questions or concerns. We can certainly see the patient much sooner if necessary.   Disclaimer: This note was dictated with voice recognition software. Similar sounding words can inadvertently be transcribed and may not be corrected upon review.

## 2021-05-08 DIAGNOSIS — I10 Essential (primary) hypertension: Secondary | ICD-10-CM | POA: Diagnosis not present

## 2021-05-08 DIAGNOSIS — Z720 Tobacco use: Secondary | ICD-10-CM | POA: Diagnosis not present

## 2021-05-08 DIAGNOSIS — Z0000 Encounter for general adult medical examination without abnormal findings: Secondary | ICD-10-CM | POA: Diagnosis not present

## 2021-05-08 DIAGNOSIS — I480 Paroxysmal atrial fibrillation: Secondary | ICD-10-CM | POA: Diagnosis not present

## 2021-05-08 DIAGNOSIS — G4733 Obstructive sleep apnea (adult) (pediatric): Secondary | ICD-10-CM | POA: Diagnosis not present

## 2021-05-08 DIAGNOSIS — E782 Mixed hyperlipidemia: Secondary | ICD-10-CM | POA: Diagnosis not present

## 2021-05-08 DIAGNOSIS — M170 Bilateral primary osteoarthritis of knee: Secondary | ICD-10-CM | POA: Diagnosis not present

## 2021-05-08 DIAGNOSIS — Z85820 Personal history of malignant melanoma of skin: Secondary | ICD-10-CM | POA: Diagnosis not present

## 2021-05-08 DIAGNOSIS — I672 Cerebral atherosclerosis: Secondary | ICD-10-CM | POA: Diagnosis not present

## 2021-06-10 ENCOUNTER — Other Ambulatory Visit: Payer: Self-pay | Admitting: Physician Assistant

## 2021-06-10 DIAGNOSIS — I2692 Saddle embolus of pulmonary artery without acute cor pulmonale: Secondary | ICD-10-CM

## 2021-06-15 DIAGNOSIS — H59811 Chorioretinal scars after surgery for detachment, right eye: Secondary | ICD-10-CM | POA: Diagnosis not present

## 2021-06-15 DIAGNOSIS — H2513 Age-related nuclear cataract, bilateral: Secondary | ICD-10-CM | POA: Diagnosis not present

## 2021-06-15 DIAGNOSIS — H31011 Macula scars of posterior pole (postinflammatory) (post-traumatic), right eye: Secondary | ICD-10-CM | POA: Diagnosis not present

## 2021-06-15 DIAGNOSIS — D23111 Other benign neoplasm of skin of right upper eyelid, including canthus: Secondary | ICD-10-CM | POA: Diagnosis not present

## 2021-06-23 ENCOUNTER — Inpatient Hospital Stay: Payer: Medicare Other | Attending: Internal Medicine

## 2021-06-23 ENCOUNTER — Other Ambulatory Visit: Payer: Self-pay

## 2021-06-23 ENCOUNTER — Ambulatory Visit (HOSPITAL_COMMUNITY)
Admission: RE | Admit: 2021-06-23 | Discharge: 2021-06-23 | Disposition: A | Discharge status: Home / Self Care | Payer: Medicare Other | Source: Ambulatory Visit | Attending: Internal Medicine | Admitting: Internal Medicine

## 2021-06-23 DIAGNOSIS — Z7901 Long term (current) use of anticoagulants: Secondary | ICD-10-CM | POA: Insufficient documentation

## 2021-06-23 DIAGNOSIS — J439 Emphysema, unspecified: Secondary | ICD-10-CM | POA: Diagnosis not present

## 2021-06-23 DIAGNOSIS — D1801 Hemangioma of skin and subcutaneous tissue: Secondary | ICD-10-CM | POA: Diagnosis not present

## 2021-06-23 DIAGNOSIS — C3490 Malignant neoplasm of unspecified part of unspecified bronchus or lung: Secondary | ICD-10-CM | POA: Diagnosis not present

## 2021-06-23 DIAGNOSIS — R911 Solitary pulmonary nodule: Secondary | ICD-10-CM | POA: Diagnosis not present

## 2021-06-23 DIAGNOSIS — Z85820 Personal history of malignant melanoma of skin: Secondary | ICD-10-CM | POA: Diagnosis not present

## 2021-06-23 DIAGNOSIS — Z79899 Other long term (current) drug therapy: Secondary | ICD-10-CM | POA: Insufficient documentation

## 2021-06-23 DIAGNOSIS — D2262 Melanocytic nevi of left upper limb, including shoulder: Secondary | ICD-10-CM | POA: Diagnosis not present

## 2021-06-23 DIAGNOSIS — I700 Atherosclerosis of aorta: Secondary | ICD-10-CM | POA: Diagnosis not present

## 2021-06-23 DIAGNOSIS — K760 Fatty (change of) liver, not elsewhere classified: Secondary | ICD-10-CM | POA: Diagnosis not present

## 2021-06-23 DIAGNOSIS — C7951 Secondary malignant neoplasm of bone: Secondary | ICD-10-CM | POA: Insufficient documentation

## 2021-06-23 DIAGNOSIS — Z9221 Personal history of antineoplastic chemotherapy: Secondary | ICD-10-CM | POA: Insufficient documentation

## 2021-06-23 DIAGNOSIS — Z08 Encounter for follow-up examination after completed treatment for malignant neoplasm: Secondary | ICD-10-CM | POA: Diagnosis not present

## 2021-06-23 DIAGNOSIS — Z923 Personal history of irradiation: Secondary | ICD-10-CM | POA: Insufficient documentation

## 2021-06-23 DIAGNOSIS — R918 Other nonspecific abnormal finding of lung field: Secondary | ICD-10-CM | POA: Diagnosis not present

## 2021-06-23 DIAGNOSIS — L814 Other melanin hyperpigmentation: Secondary | ICD-10-CM | POA: Diagnosis not present

## 2021-06-23 DIAGNOSIS — L821 Other seborrheic keratosis: Secondary | ICD-10-CM | POA: Diagnosis not present

## 2021-06-23 DIAGNOSIS — C3431 Malignant neoplasm of lower lobe, right bronchus or lung: Secondary | ICD-10-CM | POA: Insufficient documentation

## 2021-06-23 LAB — CBC WITH DIFFERENTIAL (CANCER CENTER ONLY)
Abs Immature Granulocytes: 0.01 10*3/uL (ref 0.00–0.07)
Basophils Absolute: 0 10*3/uL (ref 0.0–0.1)
Basophils Relative: 0 %
Eosinophils Absolute: 0.1 10*3/uL (ref 0.0–0.5)
Eosinophils Relative: 2 %
HCT: 39 % (ref 39.0–52.0)
Hemoglobin: 13.5 g/dL (ref 13.0–17.0)
Immature Granulocytes: 0 %
Lymphocytes Relative: 31 %
Lymphs Abs: 1.8 10*3/uL (ref 0.7–4.0)
MCH: 33.3 pg (ref 26.0–34.0)
MCHC: 34.6 g/dL (ref 30.0–36.0)
MCV: 96.3 fL (ref 80.0–100.0)
Monocytes Absolute: 0.6 10*3/uL (ref 0.1–1.0)
Monocytes Relative: 11 %
Neutro Abs: 3.3 10*3/uL (ref 1.7–7.7)
Neutrophils Relative %: 56 %
Platelet Count: 146 10*3/uL — ABNORMAL LOW (ref 150–400)
RBC: 4.05 MIL/uL — ABNORMAL LOW (ref 4.22–5.81)
RDW: 13.5 % (ref 11.5–15.5)
WBC Count: 5.8 10*3/uL (ref 4.0–10.5)
nRBC: 0 % (ref 0.0–0.2)

## 2021-06-23 LAB — CMP (CANCER CENTER ONLY)
ALT: 22 U/L (ref 0–44)
AST: 25 U/L (ref 15–41)
Albumin: 4.2 g/dL (ref 3.5–5.0)
Alkaline Phosphatase: 98 U/L (ref 38–126)
Anion gap: 6 (ref 5–15)
BUN: 16 mg/dL (ref 8–23)
CO2: 25 mmol/L (ref 22–32)
Calcium: 9.5 mg/dL (ref 8.9–10.3)
Chloride: 107 mmol/L (ref 98–111)
Creatinine: 1.2 mg/dL (ref 0.61–1.24)
GFR, Estimated: >60 mL/min (ref >60)
Glucose, Bld: 98 mg/dL (ref 70–99)
Potassium: 4.2 mmol/L (ref 3.5–5.1)
Sodium: 138 mmol/L (ref 135–145)
Total Bilirubin: 0.5 mg/dL (ref 0.3–1.2)
Total Protein: 7.1 g/dL (ref 6.5–8.1)

## 2021-06-23 MED ORDER — IOHEXOL 350 MG/ML SOLN
80.0000 mL | Freq: Once | INTRAVENOUS | Status: AC | PRN
  Administered 2021-06-23: 17:00:00 80 mL via INTRAVENOUS

## 2021-06-30 ENCOUNTER — Other Ambulatory Visit: Payer: Self-pay

## 2021-06-30 ENCOUNTER — Inpatient Hospital Stay (HOSPITAL_BASED_OUTPATIENT_CLINIC_OR_DEPARTMENT_OTHER): Payer: Medicare Other | Admitting: Internal Medicine

## 2021-06-30 VITALS — BP 145/55 | HR 56 | Temp 97.4°F | Resp 17 | Wt 179.6 lb

## 2021-06-30 DIAGNOSIS — Z79899 Other long term (current) drug therapy: Secondary | ICD-10-CM | POA: Diagnosis not present

## 2021-06-30 DIAGNOSIS — Z9221 Personal history of antineoplastic chemotherapy: Secondary | ICD-10-CM | POA: Diagnosis not present

## 2021-06-30 DIAGNOSIS — Z7901 Long term (current) use of anticoagulants: Secondary | ICD-10-CM | POA: Insufficient documentation

## 2021-06-30 DIAGNOSIS — C3492 Malignant neoplasm of unspecified part of left bronchus or lung: Secondary | ICD-10-CM

## 2021-06-30 DIAGNOSIS — C7951 Secondary malignant neoplasm of bone: Secondary | ICD-10-CM | POA: Insufficient documentation

## 2021-06-30 DIAGNOSIS — C3431 Malignant neoplasm of lower lobe, right bronchus or lung: Secondary | ICD-10-CM | POA: Diagnosis not present

## 2021-06-30 DIAGNOSIS — Z923 Personal history of irradiation: Secondary | ICD-10-CM | POA: Diagnosis not present

## 2021-06-30 DIAGNOSIS — C3432 Malignant neoplasm of lower lobe, left bronchus or lung: Secondary | ICD-10-CM | POA: Diagnosis present

## 2021-06-30 NOTE — Progress Notes (Signed)
[facility] Telephone:(336) [phone]   Fax:(336) [phone]  OFFICE PROGRESS NOTE  Aura Dials, MD [place] [address]  DIAGNOSIS: Stage IV (T3, N0, M1c) non-small cell lung cancer, adenocarcinoma presented with cavitary left lower lobe lung mass in addition to other pulmonary nodules and scattered hypermetabolic osseous metastatic disease involving the spine, ribs and bony pelvis as well as left proximal humeral pathologic fracture.    Molecular Biomarkers performed by [employer]ECT[employer]TERATION(S) / [name]      [id number]  Afatinib, Dacomitinib, Erlotinib, Gefitinib, Osimertinib, Ramucirumab Neratinib   FGFR1Amplification  Erdafitinib, Lenvatinib, Nintedanib, Pazopanib, Pemigatinib, Ponatinib   [id number] None   PRIOR THERAPY: None   CURRENT THERAPY:  1) Palliative radiotherapy to the the left humerus under the care of Dr. Sondra Come. Last treatment on 04/24/2019 2) Targeted therapy with Tagrisso 80 p.o. daily.  He started the first dose on April 21, 2019.  Status post 26 months of treatment.  INTERVAL HISTORY: Albert Perez 70 y.o. male returns to the clinic today for follow-up visit.  The patient is feeling fine today with no concerning complaints except for occasional pain on the left shoulder area.  He denied having any current chest pain, shortness of breath, cough or hemoptysis.  He has no nausea, vomiting, diarrhea or constipation.  He has no headache or visual changes.  He denied having any weight loss or night sweats.  He has mild rash.  The patient continues to tolerate his treatment with Tagrisso fairly well.  He is here today for evaluation with repeat CT scan of the chest, abdomen and pelvis for restaging of his disease.  MEDICAL HISTORY: Past Medical History:  Diagnosis Date   A-fib [facility])    PMH: 2000-2001   Arthritis    Cancer ([place])    melanoma 2018   Diarrhea    due to medications   DVT (deep  venous thrombosis) (HCC)    Left brachial vein DVT (01/30/19, following fracture)   Dysrhythmia    afib -    ETOH abuse    GERD (gastroesophageal reflux disease)    ocassional   Lung cancer ([place]) dx'd 02/26/2019   Metastatic cancer to bone ([place]) dx'd 02/26/2019   Sleep apnea    do not wear CPAP   Stroke ([place]) 2019   " mini"   Vertigo    Wears glasses     ALLERGIES:  has No Known Allergies.  MEDICATIONS:  Current Outpatient Medications  Medication Sig Dispense Refill   acetaminophen (TYLENOL) 650 MG CR tablet Take 650 mg by mouth every 8 (eight) hours as needed for pain.     buPROPion (WELLBUTRIN XL) 150 MG 24 hr tablet Take 150 mg by mouth daily as needed (nicotine cravings).     carvedilol (COREG) 6.25 MG tablet TAKE 1 TABLET BY MOUTH TWICE A DAY (Patient taking differently: Take 6.25 mg by mouth 2 (two) times daily with a meal.) 180 tablet 3   ELIQUIS 5 MG TABS tablet TAKE 1 TABLET BY MOUTH TWICE A DAY 60 tablet 2   loperamide (IMODIUM) 2 MG capsule Take 2 mg by mouth as needed for diarrhea or loose stools.     Multiple Vitamin (MULTIVITAMIN PO) Take 1 tablet by mouth daily. Centrum     Omega-3 Fatty Acids (OMEGA-3 PO) Take 1 capsule by mouth daily.     osimertinib mesylate (TAGRISSO) 80 MG tablet Take 1 tablet (80 mg total) by mouth  daily. 30 tablet 2   OVER THE COUNTER MEDICATION Take 1 tablet by mouth daily. Standard Process, LivaPlex (Liver supplement)     rosuvastatin (CRESTOR) 5 MG tablet Take 5 mg by mouth daily.  2   TURMERIC PO Take 1 capsule by mouth daily.     vitamin B-12 (CYANOCOBALAMIN) 1000 MCG tablet Take 1,000 mcg by mouth daily.     No current facility-administered medications for this visit.    SURGICAL HISTORY:  Past Surgical History:  Procedure Laterality Date   ANKLE FRACTURE SURGERY Right 2003   COLONOSCOPY  2018   ELBOW SURGERY Left 1964   INGUINAL HERNIA REPAIR Bilateral 11/12/2020   Procedure: BILATERAL OPEN INGUINAL HERNIA REPAIR WITH MESH;   Surgeon: Coralie Keens, MD;  Location: [facility];  Service: General;  Laterality: Bilateral;   melanoma removal  04/2017   Lt shoulder   RETINAL DETACHMENT SURGERY Right 1971   TONSILLECTOMY     VIDEO BRONCHOSCOPY WITH ENDOBRONCHIAL NAVIGATION N/A 03/28/2019   Procedure: VIDEO BRONCHOSCOPY WITH ENDOBRONCHIAL NAVIGATION and Biopsies;  Surgeon: Collene Gobble, MD;  Location[facility];  Service: Thoracic;  Laterality: N/A;    REVIEW OF SYSTEMS:  Constitutional: negative Eyes: negative Ears, nose, mouth, throat, and face: negative Respiratory: negative Cardiovascular: negative Gastrointestinal: negative Genitourinary:negative Integument/breast: negative Hematologic/lymphatic: negative Musculoskeletal:positive for arthralgias Neurological: negative Behavioral/Psych: negative Endocrine: negative Allergic/Immunologic: negative   PHYSICAL EXAMINATION: General appearance: alert, cooperative, and no distress Head: Normocephalic, without obvious abnormality, atraumatic Neck: no adenopathy, no JVD, supple, symmetrical, trachea midline, and thyroid not enlarged, symmetric, no tenderness/mass/nodules Lymph nodes: Cervical, supraclavicular, and axillary nodes normal. Resp: clear to auscultation bilaterally Back: symmetric, no curvature. ROM normal. No CVA tenderness. Cardio: regular rate and rhythm, S1, S2 normal, no murmur, click, rub or gallop GI: soft, non-tender; bowel sounds normal; no masses,  no organomegaly Extremities: extremities normal, atraumatic, no cyanosis or edema Neurologic: Alert and oriented X 3, normal strength and tone. Normal symmetric reflexes. Normal coordination and gait   ECOG PERFORMANCE STATUS: 1 - Symptomatic but completely ambulatory  Blood pressure (!) 145/55, pulse (!) 56, temperature (!) 97.4 F (36.3 C), temperature source Tympanic, resp. rate 17, weight 179 lb 9 oz (81.4 kg), SpO2 100 %.  LABORATORY DATA: Lab Results  Component Value Date   WBC 5.8  06/23/2021   HGB 13.5 06/23/2021   HCT 39.0 06/23/2021   MCV 96.3 06/23/2021   PLT 146 (L) 06/23/2021      Chemistry      Component Value Date/Time   NA 138 06/23/2021 1452   K 4.2 06/23/2021 1452   CL 107 06/23/2021 1452   CO2 25 06/23/2021 1452   BUN 16 06/23/2021 1452   CREATININE 1.20 06/23/2021 1452      Component Value Date/Time   CALCIUM 9.5 06/23/2021 1452   ALKPHOS 98 06/23/2021 1452   AST 25 06/23/2021 1452   ALT 22 06/23/2021 1452   BILITOT 0.5 06/23/2021 1452       RADIOGRAPHIC STUDIES: CT Chest W Contrast  Result Date: 06/24/2021 CLINICAL DATA:  Lung cancer staging. EXAM: CT CHEST, ABDOMEN, AND PELVIS WITH CONTRAST TECHNIQUE: Multidetector CT imaging of the chest, abdomen and pelvis was performed following the standard protocol during bolus administration of intravenous contrast. CONTRAST:  70m OMNIPAQUE IOHEXOL 350 MG/ML SOLN COMPARISON:  02/23/2021. FINDINGS: CT CHEST FINDINGS Cardiovascular: Atherosclerotic calcification of the aorta, aortic valve and coronary arteries. Heart is at the upper limits of normal in size. No pericardial effusion. Mediastinum/Nodes: Mediastinal lymph  nodes are not enlarged by CT size criteria. No hilar or axillary lymph nodes. Esophagus is unremarkable. Lungs/Pleura: Centrilobular emphysema. Smoking related respiratory bronchiolitis. Nodular density in the right middle lobe is seen centrally, measuring 0.7 x 2.0 cm (5/90), unchanged. Calcified granulomas. 4 mm medial right lower lobe nodule (5/120), stable. Spiculated masslike parenchymal retraction in the superior segment left lower lobe measures 2.2 x 3.0 cm (5/70), similar. Lungs are otherwise clear. No pleural fluid. Airway is unremarkable. Musculoskeletal: Degenerative changes in the spine. Mixed lytic and sclerotic lesions in the T7 and T8 vertebral bodies, possibly with increased sclerosis, suggesting healing. CT ABDOMEN PELVIS FINDINGS Hepatobiliary: Liver is slightly decreased in  attenuation diffusely. Liver and gallbladder are otherwise unremarkable. No biliary ductal dilatation. Pancreas: Negative. Spleen: Negative. Adrenals/Urinary Tract: Adrenal glands are unremarkable. Fluid density 2.3 cm cyst in the right kidney. Kidneys are otherwise unremarkable. Ureters are decompressed. Bladder is unremarkable. Stomach/Bowel: Stomach, small bowel, appendix and colon are unremarkable. Vascular/Lymphatic: Atherosclerotic calcification of the aorta. Abdominal retroperitoneal lymph nodes measure up to 11 mm in the left periaortic station (3/71), unchanged. Pelvic retroperitoneal lymph nodes measure up to 9 mm in the right external iliac station (3/99), also unchanged. Inguinal lymph nodes measure up to 1.7 cm on the left and have fatty hila, as before. Reproductive: Prostate is visualized. Other: No free fluid. Mesenteries and peritoneum are otherwise unremarkable. Musculoskeletal: Sclerotic lesions in the right acetabulum, medial right iliac wing and lumbar spine appear similar. IMPRESSION: 1. Stable treated lesion in the left lower lobe. Osseous metastatic disease appears grossly stable although there may be interval lesion healing within the midthoracic spine. 2. Stable pulmonary nodules. Recommend continued attention on follow-up. 3. Hepatic steatosis. 4. Small to borderline enlarged retroperitoneal lymph nodes, stable. 5. Aortic atherosclerosis (ICD10-I70.0). Coronary artery calcification. 6.  Emphysema (ICD10-J43.9). Electronically Signed   By: Lorin Picket M.D.   On: 06/24/2021 12:14   CT Abdomen Pelvis W Contrast  Result Date: 06/24/2021 CLINICAL DATA:  Lung cancer staging. EXAM: CT CHEST, ABDOMEN, AND PELVIS WITH CONTRAST TECHNIQUE: Multidetector CT imaging of the chest, abdomen and pelvis was performed following the standard protocol during bolus administration of intravenous contrast. CONTRAST:  13m OMNIPAQUE IOHEXOL 350 MG/ML SOLN COMPARISON:  02/23/2021. FINDINGS: CT CHEST  FINDINGS Cardiovascular: Atherosclerotic calcification of the aorta, aortic valve and coronary arteries. Heart is at the upper limits of normal in size. No pericardial effusion. Mediastinum/Nodes: Mediastinal lymph nodes are not enlarged by CT size criteria. No hilar or axillary lymph nodes. Esophagus is unremarkable. Lungs/Pleura: Centrilobular emphysema. Smoking related respiratory bronchiolitis. Nodular density in the right middle lobe is seen centrally, measuring 0.7 x 2.0 cm (5/90), unchanged. Calcified granulomas. 4 mm medial right lower lobe nodule (5/120), stable. Spiculated masslike parenchymal retraction in the superior segment left lower lobe measures 2.2 x 3.0 cm (5/70), similar. Lungs are otherwise clear. No pleural fluid. Airway is unremarkable. Musculoskeletal: Degenerative changes in the spine. Mixed lytic and sclerotic lesions in the T7 and T8 vertebral bodies, possibly with increased sclerosis, suggesting healing. CT ABDOMEN PELVIS FINDINGS Hepatobiliary: Liver is slightly decreased in attenuation diffusely. Liver and gallbladder are otherwise unremarkable. No biliary ductal dilatation. Pancreas: Negative. Spleen: Negative. Adrenals/Urinary Tract: Adrenal glands are unremarkable. Fluid density 2.3 cm cyst in the right kidney. Kidneys are otherwise unremarkable. Ureters are decompressed. Bladder is unremarkable. Stomach/Bowel: Stomach, small bowel, appendix and colon are unremarkable. Vascular/Lymphatic: Atherosclerotic calcification of the aorta. Abdominal retroperitoneal lymph nodes measure up to 11 mm in the left periaortic station (3/71),  unchanged. Pelvic retroperitoneal lymph nodes measure up to 9 mm in the right external iliac station (3/99), also unchanged. Inguinal lymph nodes measure up to 1.7 cm on the left and have fatty hila, as before. Reproductive: Prostate is visualized. Other: No free fluid. Mesenteries and peritoneum are otherwise unremarkable. Musculoskeletal: Sclerotic lesions in  the right acetabulum, medial right iliac wing and lumbar spine appear similar. IMPRESSION: 1. Stable treated lesion in the left lower lobe. Osseous metastatic disease appears grossly stable although there may be interval lesion healing within the midthoracic spine. 2. Stable pulmonary nodules. Recommend continued attention on follow-up. 3. Hepatic steatosis. 4. Small to borderline enlarged retroperitoneal lymph nodes, stable. 5. Aortic atherosclerosis (ICD10-I70.0). Coronary artery calcification. 6.  Emphysema (ICD10-J43.9). Electronically Signed   By: Lorin Picket M.D.   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  He is status post palliative radiotherapy to the metastatic disease in the left arm. The patient is currently undergoing treatment with target therapy with Tagrisso 80 mg p.o. daily started 26 months ago. The patient has been tolerating his treatment with Tagrisso fairly well with no concerning adverse effects. He had repeat CT scan of the chest, abdomen pelvis performed recently.  I personally and independently reviewed the scan and discussed the results with the patient today. His scan showed no concerning findings for disease progression. I recommended for him to continue his current treatment with Tagrisso with the same dose. I will see him back for follow-up visit in 2 months for evaluation and repeat blood work. The patient was advised to call immediately if he has any other concerning symptoms in the interval. The patient voices understanding of current disease status  and treatment options and is in agreement with the current care plan. All questions were answered. The patient knows to call the clinic with any problems, questions or concerns. We can certainly see the patient much sooner if necessary.  Disclaimer: This note was dictated with voice recognition software. Similar sounding words can inadvertently be transcribed and may not be corrected upon review.

## 2021-07-09 ENCOUNTER — Telehealth: Payer: Self-pay

## 2021-07-09 ENCOUNTER — Other Ambulatory Visit: Payer: Self-pay | Admitting: *Deleted

## 2021-07-09 DIAGNOSIS — C3492 Malignant neoplasm of unspecified part of left bronchus or lung: Secondary | ICD-10-CM

## 2021-07-09 MED ORDER — OSIMERTINIB MESYLATE 80 MG PO TABS: 80.0000 mg | ORAL_TABLET | Freq: Every day | ORAL | 2 refills | Status: DC

## 2021-07-09 NOTE — Telephone Encounter (Signed)
error 

## 2021-07-10 ENCOUNTER — Telehealth: Payer: Self-pay | Admitting: Internal Medicine

## 2021-07-10 NOTE — Telephone Encounter (Signed)
Sch per 12/27 los, pt aware

## 2021-07-10 NOTE — Telephone Encounter (Signed)
Sch per 12/28 los, pt aware

## 2021-07-16 ENCOUNTER — Telehealth: Payer: Self-pay

## 2021-07-16 ENCOUNTER — Other Ambulatory Visit (HOSPITAL_COMMUNITY): Payer: Self-pay

## 2021-07-16 ENCOUNTER — Encounter: Payer: Self-pay | Admitting: Internal Medicine

## 2021-07-16 ENCOUNTER — Telehealth: Payer: Self-pay | Admitting: Medical Oncology

## 2021-07-16 DIAGNOSIS — C3492 Malignant neoplasm of unspecified part of left bronchus or lung: Secondary | ICD-10-CM

## 2021-07-16 MED ORDER — OSIMERTINIB MESYLATE 80 MG PO TABS
80.0000 mg | ORAL_TABLET | Freq: Every day | ORAL | 0 refills | Status: DC
  Filled 2021-07-16 (×2): qty 30, 30d supply, fill #0

## 2021-07-16 NOTE — Telephone Encounter (Signed)
His Tagrisso order is delayed by 10 days because of weather in [place].  Rx sent to [facility] for interim supply per Leron Croak.

## 2021-07-16 NOTE — Telephone Encounter (Signed)
Oral Oncology Patient Advocate Encounter   Was successful in securing patient an $82 grant from Patient [facility]) to provide copayment coverage for Tagrisso.  This will keep the out of pocket expense at $0.     I have spoken with the patient.    The billing information is as follows and has been shared with [place].   Member ID: [id number]  Group ID: [id number]  RxBin: [id number] Dates of Eligibility: 04/17/21 through 07/15/22  Fund:  [place] [facility] [place] Phone [phone] Fax [phone] 07/16/2021 10:44 AM

## 2021-07-27 DIAGNOSIS — R1032 Left lower quadrant pain: Secondary | ICD-10-CM | POA: Diagnosis not present

## 2021-08-19 DIAGNOSIS — L905 Scar conditions and fibrosis of skin: Secondary | ICD-10-CM | POA: Diagnosis not present

## 2021-08-19 DIAGNOSIS — D485 Neoplasm of uncertain behavior of skin: Secondary | ICD-10-CM | POA: Diagnosis not present

## 2021-08-19 DIAGNOSIS — D2262 Melanocytic nevi of left upper limb, including shoulder: Secondary | ICD-10-CM | POA: Diagnosis not present

## 2021-09-01 ENCOUNTER — Inpatient Hospital Stay: Payer: Medicare Other | Admitting: Internal Medicine

## 2021-09-01 ENCOUNTER — Other Ambulatory Visit: Payer: Self-pay

## 2021-09-01 ENCOUNTER — Other Ambulatory Visit (HOSPITAL_COMMUNITY): Payer: Self-pay

## 2021-09-01 ENCOUNTER — Inpatient Hospital Stay: Payer: Medicare Other | Attending: Internal Medicine

## 2021-09-01 VITALS — BP 156/68 | HR 51 | Temp 97.5°F | Resp 19 | Ht 68.0 in | Wt 176.0 lb

## 2021-09-01 DIAGNOSIS — Z7901 Long term (current) use of anticoagulants: Secondary | ICD-10-CM | POA: Diagnosis not present

## 2021-09-01 DIAGNOSIS — Z79899 Other long term (current) drug therapy: Secondary | ICD-10-CM | POA: Insufficient documentation

## 2021-09-01 DIAGNOSIS — C7951 Secondary malignant neoplasm of bone: Secondary | ICD-10-CM | POA: Insufficient documentation

## 2021-09-01 DIAGNOSIS — Z86718 Personal history of other venous thrombosis and embolism: Secondary | ICD-10-CM | POA: Diagnosis not present

## 2021-09-01 DIAGNOSIS — C3432 Malignant neoplasm of lower lobe, left bronchus or lung: Secondary | ICD-10-CM | POA: Diagnosis not present

## 2021-09-01 DIAGNOSIS — C3492 Malignant neoplasm of unspecified part of left bronchus or lung: Secondary | ICD-10-CM

## 2021-09-01 DIAGNOSIS — Z5111 Encounter for antineoplastic chemotherapy: Secondary | ICD-10-CM

## 2021-09-01 DIAGNOSIS — C3490 Malignant neoplasm of unspecified part of unspecified bronchus or lung: Secondary | ICD-10-CM

## 2021-09-01 LAB — CBC WITH DIFFERENTIAL (CANCER CENTER ONLY)
Abs Immature Granulocytes: 0.03 10*3/uL (ref 0.00–0.07)
Basophils Absolute: 0 10*3/uL (ref 0.0–0.1)
Basophils Relative: 1 %
Eosinophils Absolute: 0.1 10*3/uL (ref 0.0–0.5)
Eosinophils Relative: 2 %
HCT: 39 % (ref 39.0–52.0)
Hemoglobin: 13.2 g/dL (ref 13.0–17.0)
Immature Granulocytes: 1 %
Lymphocytes Relative: 30 %
Lymphs Abs: 1.8 10*3/uL (ref 0.7–4.0)
MCH: 32.7 pg (ref 26.0–34.0)
MCHC: 33.8 g/dL (ref 30.0–36.0)
MCV: 96.5 fL (ref 80.0–100.0)
Monocytes Absolute: 0.6 10*3/uL (ref 0.1–1.0)
Monocytes Relative: 10 %
Neutro Abs: 3.4 10*3/uL (ref 1.7–7.7)
Neutrophils Relative %: 56 %
Platelet Count: 148 10*3/uL — ABNORMAL LOW (ref 150–400)
RBC: 4.04 MIL/uL — ABNORMAL LOW (ref 4.22–5.81)
RDW: 13.8 % (ref 11.5–15.5)
WBC Count: 5.9 10*3/uL (ref 4.0–10.5)
nRBC: 0 % (ref 0.0–0.2)

## 2021-09-01 LAB — CMP (CANCER CENTER ONLY)
ALT: 24 U/L (ref 0–44)
AST: 21 U/L (ref 15–41)
Albumin: 4.2 g/dL (ref 3.5–5.0)
Alkaline Phosphatase: 107 U/L (ref 38–126)
Anion gap: 4 — ABNORMAL LOW (ref 5–15)
BUN: 17 mg/dL (ref 8–23)
CO2: 26 mmol/L (ref 22–32)
Calcium: 9.4 mg/dL (ref 8.9–10.3)
Chloride: 108 mmol/L (ref 98–111)
Creatinine: 1.27 mg/dL — ABNORMAL HIGH (ref 0.61–1.24)
GFR, Estimated: >60 mL/min (ref >60)
Glucose, Bld: 106 mg/dL — ABNORMAL HIGH (ref 70–99)
Potassium: 4.4 mmol/L (ref 3.5–5.1)
Sodium: 138 mmol/L (ref 135–145)
Total Bilirubin: 0.6 mg/dL (ref 0.3–1.2)
Total Protein: 7 g/dL (ref 6.5–8.1)

## 2021-09-01 NOTE — Progress Notes (Signed)
[place] Telephone:(336) [phone]   Fax:(336) [phone]  OFFICE PROGRESS NOTE  Aura Dials, MD [place] [address]  DIAGNOSIS: Stage IV (T3, N0, M1c) non-small cell lung cancer, adenocarcinoma presented with cavitary left lower lobe lung mass in addition to other pulmonary nodules and scattered hypermetabolic osseous metastatic disease involving the spine, ribs and bony pelvis as well as left proximal humeral pathologic fracture.    Molecular Biomarkers performed by [employer] ALTERATION(S) / [name]      [id number]  Afatinib, Dacomitinib, Erlotinib, Gefitinib, Osimertinib, Ramucirumab Neratinib   FGFR1Amplification  Erdafitinib, Lenvatinib, Nintedanib, Pazopanib, Pemigatinib, Ponatinib   [id number] None   PRIOR THERAPY: None   CURRENT THERAPY:  1) Palliative radiotherapy to the the left humerus under the care of Dr. Sondra Come. Last treatment on 04/24/2019 2) Targeted therapy with Tagrisso 80 p.o. daily.  He started the first dose on April 21, 2019.  Status post 28 months of treatment.  INTERVAL HISTORY: Albert Perez 71 y.o. male returns to the clinic today for follow-up visit.  The patient is feeling fine today with no concerning complaints.  He denied having any current chest pain, shortness of breath, cough or hemoptysis.  He denied having any fever or chills.  He has no nausea, vomiting, diarrhea or constipation.  He has no headache or visual changes.  He has no recent weight loss or night sweats.  He continues to tolerate his treatment with Tagrisso fairly well.  He is here today for evaluation and repeat blood work.   MEDICAL HISTORY: Past Medical History:  Diagnosis Date   A-fib [facility])    PMH: 2000-2001   Arthritis    Cancer ([place])    melanoma 2018   Diarrhea    due to medications   DVT (deep venous thrombosis) (HCC)    Left brachial vein DVT (01/30/19, following fracture)   Dysrhythmia    afib -     ETOH abuse    GERD (gastroesophageal reflux disease)    ocassional   Lung cancer ([place]) dx'd 02/26/2019   Metastatic cancer to bone ([place]) dx'd 02/26/2019   Sleep apnea    do not wear CPAP   Stroke ([place]) 2019   " mini"   Vertigo    Wears glasses     ALLERGIES:  has No Known Allergies.  MEDICATIONS:  Current Outpatient Medications  Medication Sig Dispense Refill   acetaminophen (TYLENOL) 650 MG CR tablet Take 650 mg by mouth every 8 (eight) hours as needed for pain.     buPROPion (WELLBUTRIN XL) 150 MG 24 hr tablet Take 150 mg by mouth daily as needed (nicotine cravings).     carvedilol (COREG) 6.25 MG tablet TAKE 1 TABLET BY MOUTH TWICE A DAY (Patient taking differently: Take 6.25 mg by mouth 2 (two) times daily with a meal.) 180 tablet 3   ELIQUIS 5 MG TABS tablet TAKE 1 TABLET BY MOUTH TWICE A DAY 60 tablet 2   loperamide (IMODIUM) 2 MG capsule Take 2 mg by mouth as needed for diarrhea or loose stools.     Multiple Vitamin (MULTIVITAMIN PO) Take 1 tablet by mouth daily. Centrum     Omega-3 Fatty Acids (OMEGA-3 PO) Take 1 capsule by mouth daily.     osimertinib mesylate (TAGRISSO) 80 MG tablet Take 1 tablet (80 mg total) by mouth daily. 30 tablet 0   OVER THE COUNTER MEDICATION Take 1 tablet by mouth daily. Standard Process,  LivaPlex (Liver supplement)     rosuvastatin (CRESTOR) 5 MG tablet Take 5 mg by mouth daily.  2   TURMERIC PO Take 1 capsule by mouth daily.     vitamin B-12 (CYANOCOBALAMIN) 1000 MCG tablet Take 1,000 mcg by mouth daily.     No current facility-administered medications for this visit.    SURGICAL HISTORY:  Past Surgical History:  Procedure Laterality Date   ANKLE FRACTURE SURGERY Right 2003   COLONOSCOPY  2018   ELBOW SURGERY Left 1964   INGUINAL HERNIA REPAIR Bilateral 11/12/2020   Procedure: BILATERAL OPEN INGUINAL HERNIA REPAIR WITH MESH;  Surgeon: Coralie Keens, MD;  Location: [place];  Service: General;  Laterality: Bilateral;   melanoma removal   04/2017   Lt shoulder   RETINAL DETACHMENT SURGERY Right 1971   TONSILLECTOMY     VIDEO BRONCHOSCOPY WITH ENDOBRONCHIAL NAVIGATION N/A 03/28/2019   Procedure: VIDEO BRONCHOSCOPY WITH ENDOBRONCHIAL NAVIGATION and Biopsies;  Surgeon: Collene Gobble, MD;  [facility];  Service: Thoracic;  Laterality: N/A;    REVIEW OF SYSTEMS:  A comprehensive review of systems was negative.   PHYSICAL EXAMINATION: General appearance: alert, cooperative, and no distress Head: Normocephalic, without obvious abnormality, atraumatic Neck: no adenopathy, no JVD, supple, symmetrical, trachea midline, and thyroid not enlarged, symmetric, no tenderness/mass/nodules Lymph nodes: Cervical, supraclavicular, and axillary nodes normal. Resp: clear to auscultation bilaterally Back: symmetric, no curvature. ROM normal. No CVA tenderness. Cardio: regular rate and rhythm, S1, S2 normal, no murmur, click, rub or gallop GI: soft, non-tender; bowel sounds normal; no masses,  no organomegaly Extremities: extremities normal, atraumatic, no cyanosis or edema   ECOG PERFORMANCE STATUS: 1 - Symptomatic but completely ambulatory  Blood pressure (!) 156/68, pulse (!) 51, temperature (!) 97.5 F (36.4 C), temperature source Tympanic, resp. rate 19, height '5\' 8"'  (1.727 m), weight 176 lb (79.8 kg), SpO2 100 %.  LABORATORY DATA: Lab Results  Component Value Date   WBC 5.9 09/01/2021   HGB 13.2 09/01/2021   HCT 39.0 09/01/2021   MCV 96.5 09/01/2021   PLT 148 (L) 09/01/2021      Chemistry      Component Value Date/Time   NA 138 06/23/2021 1452   K 4.2 06/23/2021 1452   CL 107 06/23/2021 1452   CO2 25 06/23/2021 1452   BUN 16 06/23/2021 1452   CREATININE 1.20 06/23/2021 1452      Component Value Date/Time   CALCIUM 9.5 06/23/2021 1452   ALKPHOS 98 06/23/2021 1452   AST 25 06/23/2021 1452   ALT 22 06/23/2021 1452   BILITOT 0.5 06/23/2021 1452       RADIOGRAPHIC STUDIES: No results found.  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  He is status post palliative radiotherapy to the metastatic disease in the left arm. The patient is currently undergoing treatment with target therapy with Tagrisso 80 mg p.o. daily started 28 months ago. He has been tolerating this treatment well with no concerning adverse effects except for few episodes of diarrhea every several days improved with Imodium. I recommended for him to continue his current treatment with Tagrisso with the same  dose. I will see him back for follow-up visit in 2 months for evaluation with repeat CT scan of the chest, abdomen and pelvis for restaging of his disease. The patient was advised to call immediately if he has any other concerning symptoms in the interval. The patient voices understanding of current disease status and treatment options and is in agreement with the current care plan. All questions were answered. The patient knows to call the clinic with any problems, questions or concerns. We can certainly see the patient much sooner if necessary.  Disclaimer: This note was dictated with voice recognition software. Similar sounding words can inadvertently be transcribed and may not be corrected upon review.

## 2021-09-07 ENCOUNTER — Telehealth: Payer: Self-pay | Admitting: Internal Medicine

## 2021-09-07 NOTE — Telephone Encounter (Signed)
Scheduled per 02/28 los, patient has been called and voicemail was left regarding upcoming appointments. ?

## 2021-09-08 ENCOUNTER — Other Ambulatory Visit: Payer: Self-pay | Admitting: Physician Assistant

## 2021-09-08 DIAGNOSIS — I2692 Saddle embolus of pulmonary artery without acute cor pulmonale: Secondary | ICD-10-CM

## 2021-09-23 NOTE — Telephone Encounter (Signed)
Error

## 2021-10-12 ENCOUNTER — Other Ambulatory Visit: Payer: Self-pay | Admitting: Cardiology

## 2021-10-26 ENCOUNTER — Ambulatory Visit (HOSPITAL_COMMUNITY)
Admission: RE | Admit: 2021-10-26 | Discharge: 2021-10-26 | Disposition: A | Discharge status: Home / Self Care | Payer: Medicare Other | Source: Ambulatory Visit | Attending: Internal Medicine | Admitting: Internal Medicine

## 2021-10-26 ENCOUNTER — Other Ambulatory Visit: Payer: Self-pay

## 2021-10-26 ENCOUNTER — Inpatient Hospital Stay: Payer: Medicare Other | Attending: Internal Medicine

## 2021-10-26 DIAGNOSIS — J439 Emphysema, unspecified: Secondary | ICD-10-CM | POA: Diagnosis not present

## 2021-10-26 DIAGNOSIS — C3490 Malignant neoplasm of unspecified part of unspecified bronchus or lung: Secondary | ICD-10-CM | POA: Insufficient documentation

## 2021-10-26 DIAGNOSIS — Z79899 Other long term (current) drug therapy: Secondary | ICD-10-CM | POA: Diagnosis not present

## 2021-10-26 DIAGNOSIS — Z85820 Personal history of malignant melanoma of skin: Secondary | ICD-10-CM | POA: Diagnosis not present

## 2021-10-26 DIAGNOSIS — Z86718 Personal history of other venous thrombosis and embolism: Secondary | ICD-10-CM | POA: Diagnosis not present

## 2021-10-26 DIAGNOSIS — I700 Atherosclerosis of aorta: Secondary | ICD-10-CM | POA: Diagnosis not present

## 2021-10-26 DIAGNOSIS — C7951 Secondary malignant neoplasm of bone: Secondary | ICD-10-CM | POA: Diagnosis not present

## 2021-10-26 DIAGNOSIS — Z7901 Long term (current) use of anticoagulants: Secondary | ICD-10-CM | POA: Insufficient documentation

## 2021-10-26 DIAGNOSIS — I4891 Unspecified atrial fibrillation: Secondary | ICD-10-CM | POA: Insufficient documentation

## 2021-10-26 DIAGNOSIS — C3432 Malignant neoplasm of lower lobe, left bronchus or lung: Secondary | ICD-10-CM | POA: Diagnosis not present

## 2021-10-26 LAB — CMP (CANCER CENTER ONLY)
ALT: 17 U/L (ref 0–44)
AST: 18 U/L (ref 15–41)
Albumin: 4 g/dL (ref 3.5–5.0)
Alkaline Phosphatase: 111 U/L (ref 38–126)
Anion gap: 5 (ref 5–15)
BUN: 14 mg/dL (ref 8–23)
CO2: 28 mmol/L (ref 22–32)
Calcium: 9.2 mg/dL (ref 8.9–10.3)
Chloride: 107 mmol/L (ref 98–111)
Creatinine: 1.33 mg/dL — ABNORMAL HIGH (ref 0.61–1.24)
GFR, Estimated: 57 mL/min — ABNORMAL LOW (ref >60)
Glucose, Bld: 102 mg/dL — ABNORMAL HIGH (ref 70–99)
Potassium: 4.5 mmol/L (ref 3.5–5.1)
Sodium: 140 mmol/L (ref 135–145)
Total Bilirubin: 0.5 mg/dL (ref 0.3–1.2)
Total Protein: 6.9 g/dL (ref 6.5–8.1)

## 2021-10-26 LAB — CBC WITH DIFFERENTIAL (CANCER CENTER ONLY)
Abs Immature Granulocytes: 0.01 10*3/uL (ref 0.00–0.07)
Basophils Absolute: 0 10*3/uL (ref 0.0–0.1)
Basophils Relative: 0 %
Eosinophils Absolute: 0.1 10*3/uL (ref 0.0–0.5)
Eosinophils Relative: 1 %
HCT: 40.6 % (ref 39.0–52.0)
Hemoglobin: 13.4 g/dL (ref 13.0–17.0)
Immature Granulocytes: 0 %
Lymphocytes Relative: 20 %
Lymphs Abs: 1.3 10*3/uL (ref 0.7–4.0)
MCH: 32.2 pg (ref 26.0–34.0)
MCHC: 33 g/dL (ref 30.0–36.0)
MCV: 97.6 fL (ref 80.0–100.0)
Monocytes Absolute: 0.7 10*3/uL (ref 0.1–1.0)
Monocytes Relative: 10 %
Neutro Abs: 4.5 10*3/uL (ref 1.7–7.7)
Neutrophils Relative %: 69 %
Platelet Count: 143 10*3/uL — ABNORMAL LOW (ref 150–400)
RBC: 4.16 MIL/uL — ABNORMAL LOW (ref 4.22–5.81)
RDW: 14 % (ref 11.5–15.5)
WBC Count: 6.6 10*3/uL (ref 4.0–10.5)
nRBC: 0 % (ref 0.0–0.2)

## 2021-10-26 MED ORDER — SODIUM CHLORIDE (PF) 0.9 % IJ SOLN
INTRAMUSCULAR | Status: AC
  Filled 2021-10-26: qty 50

## 2021-10-26 MED ORDER — IOHEXOL 300 MG/ML  SOLN
100.0000 mL | Freq: Once | INTRAMUSCULAR | Status: AC | PRN
  Administered 2021-10-26: 100 mL via INTRAVENOUS

## 2021-10-28 ENCOUNTER — Other Ambulatory Visit: Payer: Self-pay

## 2021-10-28 ENCOUNTER — Inpatient Hospital Stay: Payer: Medicare Other | Admitting: Internal Medicine

## 2021-10-28 VITALS — BP 127/58 | HR 98 | Temp 98.1°F | Resp 15 | Wt 175.6 lb

## 2021-10-28 DIAGNOSIS — C3492 Malignant neoplasm of unspecified part of left bronchus or lung: Secondary | ICD-10-CM | POA: Diagnosis not present

## 2021-10-28 DIAGNOSIS — Z79899 Other long term (current) drug therapy: Secondary | ICD-10-CM | POA: Diagnosis not present

## 2021-10-28 DIAGNOSIS — C7951 Secondary malignant neoplasm of bone: Secondary | ICD-10-CM | POA: Diagnosis not present

## 2021-10-28 DIAGNOSIS — Z85820 Personal history of malignant melanoma of skin: Secondary | ICD-10-CM | POA: Diagnosis not present

## 2021-10-28 DIAGNOSIS — C3432 Malignant neoplasm of lower lobe, left bronchus or lung: Secondary | ICD-10-CM | POA: Diagnosis not present

## 2021-10-28 DIAGNOSIS — Z7901 Long term (current) use of anticoagulants: Secondary | ICD-10-CM | POA: Diagnosis not present

## 2021-10-28 DIAGNOSIS — I4891 Unspecified atrial fibrillation: Secondary | ICD-10-CM | POA: Diagnosis not present

## 2021-10-28 DIAGNOSIS — Z86718 Personal history of other venous thrombosis and embolism: Secondary | ICD-10-CM | POA: Diagnosis not present

## 2021-10-28 NOTE — Progress Notes (Signed)
?    [facility] ?[phone]   [phone] ? ?OFFICE PROGRESS NOTE ? ?Aura Dials, MD ?[address] ?[place] [address] ? ?DIAGNOSIS: Stage IV (T3, N0, M1c) non-small cell lung cancer, adenocarcinoma presented with cavitary left lower lobe lung mass in addition to other pulmonary nodules and scattered hypermetabolic osseous metastatic disease involving the spine, ribs and bony pelvis as well as left proximal humeral pathologic fracture.  ?  ?Molecular Biomarkers performed by [facility] ?DETECTED ALTERATION(S) / [name]      ?[id number]  ?Afatinib, ?Dacomitinib, ?Erlotinib, ?Gefitinib, ?Osimertinib, ?Ramucirumab ?Neratinib ?  ?FGFR1Amplification  ?Erdafitinib, ?Lenvatinib, ?Nintedanib, ?Pazopanib, ?Pemigatinib, ?Ponatinib ?  ?[id number] None ?  ?PRIOR THERAPY: None ?  ?CURRENT THERAPY:  ?1) Palliative radiotherapy to the the left humerus under the care of Dr. Sondra Come. Last treatment on 04/24/2019 ?2) Targeted therapy with Tagrisso 80 p.o. daily.  He started the first dose on April 21, 2019.  Status post 30 months of treatment. ? ?INTERVAL HISTORY: ?Albert Perez 71 y.o. male returns to the clinic today for follow-up visit.  The patient is feeling fine today with no concerning complaints except for mild fatigue.  He does not exercise at regular basis.  He denied having any chest pain, shortness of breath, cough or hemoptysis.  He has no nausea, vomiting, diarrhea or constipation.  He has no headache or visual changes.  He denied having any weight loss or night sweats.  He continues to tolerate his treatment with Tagrisso fairly well.  The patient had repeat CT scan of the chest, abdomen pelvis performed recently and he is here for evaluation and discussion of his scan results. ? ? ?MEDICAL HISTORY: ?Past Medical History:  ?Diagnosis Date  ? A-fib ([place])   ? PMH: 2000-2001  ? Arthritis   ? Cancer [facility])   ? melanoma 2018  ? Diarrhea   ? due to medications  ? DVT  (deep venous thrombosis) ([place])   ? Left brachial vein DVT (01/30/19, following fracture)  ? Dysrhythmia   ? afib -   ? ETOH abuse   ? GERD (gastroesophageal reflux disease)   ? ocassional  ? Lung cancer ([place]) dx'd 02/26/2019  ? Metastatic cancer to bone ([place]) dx'd 02/26/2019  ? Sleep apnea   ? do not wear CPAP  ? Stroke [facility]) 2019  ? " mini"  ? Vertigo   ? Wears glasses   ? ? ?ALLERGIES:  has No Known Allergies. ? ?MEDICATIONS:  ?Current Outpatient Medications  ?Medication Sig Dispense Refill  ? acetaminophen (TYLENOL) 650 MG CR tablet Take 650 mg by mouth every 8 (eight) hours as needed for pain.    ? buPROPion (WELLBUTRIN XL) 150 MG 24 hr tablet Take 150 mg by mouth daily as needed (nicotine cravings).    ? carvedilol (COREG) 6.25 MG tablet TAKE 1 TABLET BY MOUTH TWICE A DAY 180 tablet 3  ? ELIQUIS 5 MG TABS tablet TAKE 1 TABLET BY MOUTH TWICE A DAY 60 tablet 2  ? loperamide (IMODIUM) 2 MG capsule Take 2 mg by mouth as needed for diarrhea or loose stools.    ? Multiple Vitamin (MULTIVITAMIN PO) Take 1 tablet by mouth daily. Centrum    ? Omega-3 Fatty Acids (OMEGA-3 PO) Take 1 capsule by mouth daily.    ? osimertinib mesylate (TAGRISSO) 80 MG tablet Take 1 tablet (80 mg total) by mouth daily. 30 tablet 0  ? OVER THE COUNTER MEDICATION Take 1 tablet by mouth  daily. Standard Process, LivaPlex (Liver supplement)    ? rosuvastatin (CRESTOR) 5 MG tablet Take 5 mg by mouth daily.  2  ? TURMERIC PO Take 1 capsule by mouth daily.    ? vitamin B-12 (CYANOCOBALAMIN) 1000 MCG tablet Take 1,000 mcg by mouth daily.    ? ?No current facility-administered medications for this visit.  ? ? ?SURGICAL HISTORY:  ?Past Surgical History:  ?Procedure Laterality Date  ? ANKLE FRACTURE SURGERY Right 2003  ? COLONOSCOPY  2018  ? ELBOW SURGERY Left 1964  ? INGUINAL HERNIA REPAIR Bilateral 11/12/2020  ? Procedure: BILATERAL OPEN INGUINAL HERNIA REPAIR WITH MESH;  Surgeon: Coralie Keens, MD;  Location: [facility];  Service: General;  Laterality:  Bilateral;  ? melanoma removal  04/2017  ? Lt shoulder  ? RETINAL DETACHMENT SURGERY Right 1971  ? TONSILLECTOMY    ? VIDEO BRONCHOSCOPY WITH ENDOBRONCHIAL NAVIGATION N/A 03/28/2019  ? Procedure: VIDEO BRONCHOSCOPY WITH ENDOBRONCHIAL NAVIGATION and Biopsies;  Surgeon: Collene Gobble, MD;  [facility];  Service: Thoracic;  Laterality: N/A;  ? ? ?REVIEW OF SYSTEMS:  Constitutional: positive for fatigue ?Eyes: negative ?Ears, nose, mouth, throat, and face: negative ?Respiratory: negative ?Cardiovascular: negative ?Gastrointestinal: negative ?Genitourinary:negative ?Integument/breast: negative ?Hematologic/lymphatic: negative ?Musculoskeletal:negative ?Neurological: negative ?Behavioral/Psych: negative ?Endocrine: negative ?Allergic/Immunologic: negative  ? ?PHYSICAL EXAMINATION: General appearance: alert, cooperative, and no distress ?Head: Normocephalic, without obvious abnormality, atraumatic ?Neck: no adenopathy, no JVD, supple, symmetrical, trachea midline, and thyroid not enlarged, symmetric, no tenderness/mass/nodules ?Lymph nodes: Cervical, supraclavicular, and axillary nodes normal. ?Resp: clear to auscultation bilaterally ?Back: symmetric, no curvature. ROM normal. No CVA tenderness. ?Cardio: regular rate and rhythm, S1, S2 normal, no murmur, click, rub or gallop ?GI: soft, non-tender; bowel sounds normal; no masses,  no organomegaly ?Extremities: extremities normal, atraumatic, no cyanosis or edema ?Neurologic: Alert and oriented X 3, normal strength and tone. Normal symmetric reflexes. Normal coordination and gait ?  ?ECOG PERFORMANCE STATUS: 1 - Symptomatic but completely ambulatory ? ?Blood pressure (!) 127/58, pulse 98, temperature 98.1 ?F (36.7 ?C), temperature source Tympanic, resp. rate 15, weight 175 lb 9.6 oz (79.7 kg), SpO2 100 %. ? ?LABORATORY DATA: ?Lab Results  ?Component Value Date  ? WBC 6.6 10/26/2021  ? HGB 13.4 10/26/2021  ? HCT 40.6 10/26/2021  ? MCV 97.6 10/26/2021  ? PLT 143 (L)  10/26/2021  ? ? ?  Chemistry   ?   ?Component Value Date/Time  ? NA 140 10/26/2021 0958  ? K 4.5 10/26/2021 0958  ? CL 107 10/26/2021 0958  ? CO2 28 10/26/2021 0958  ? BUN 14 10/26/2021 0958  ? CREATININE 1.33 (H) 10/26/2021 4235  ?    ?Component Value Date/Time  ? CALCIUM 9.2 10/26/2021 0958  ? ALKPHOS 111 10/26/2021 0958  ? AST 18 10/26/2021 0958  ? ALT 17 10/26/2021 0958  ? BILITOT 0.5 10/26/2021 0958  ?  ? ? ? ?RADIOGRAPHIC STUDIES: ?CT Chest W Contrast ? ?Result Date: 10/26/2021 ?CLINICAL DATA:  Non-small cell lung cancer restaging * Tracking Code: BO * EXAM: CT CHEST, ABDOMEN, AND PELVIS WITH CONTRAST TECHNIQUE: Multidetector CT imaging of the chest, abdomen and pelvis was performed following the standard protocol during bolus administration of intravenous contrast. RADIATION DOSE REDUCTION: This exam was performed according to the departmental dose-optimization program which includes automated exposure control, adjustment of the mA and/or kV according to patient size and/or use of iterative reconstruction technique. CONTRAST:  189mL OMNIPAQUE IOHEXOL 300 MG/ML SOLN, additional oral enteric contrast COMPARISON:  06/23/2021 FINDINGS: CT  CHEST FINDINGS Cardiovascular: Aortic atherosclerosis. Normal heart size. Three-vessel coronary artery calcifications. No pericardial effusion. Mediastinum/Nodes: No enlarged mediastinal, hilar, or axillary lymph nodes. Thyroid gland, trachea, and esophagus demonstrate no significant findings. Lungs/Pleura: Unchanged post treatment appearance of a spiculated mass of superior segment left lower lobe measuring 3.4 x 2.4 cm (series 6, image 71). Unchanged peribronchovascular nodule of the medial segment right middle lobe measuring 1.7 x 0.6 cm (series 6, image 90). Unchanged 0.4 cm nodule in the right lower lobe azygoesophageal recess (series 6, image 122). Mild paraseptal emphysema. No pleural effusion or pneumothorax. Musculoskeletal: No chest wall mass. CT ABDOMEN PELVIS FINDINGS  Hepatobiliary: No solid liver abnormality is seen. No gallstones, gallbladder wall thickening, or biliary dilatation. Pancreas: Unremarkable. No pancreatic ductal dilatation or surrounding inflammatory changes

## 2021-10-29 ENCOUNTER — Other Ambulatory Visit (HOSPITAL_COMMUNITY): Payer: Self-pay

## 2021-10-29 ENCOUNTER — Other Ambulatory Visit: Payer: Self-pay

## 2021-10-29 ENCOUNTER — Encounter: Payer: Self-pay | Admitting: Internal Medicine

## 2021-10-29 DIAGNOSIS — C3492 Malignant neoplasm of unspecified part of left bronchus or lung: Secondary | ICD-10-CM

## 2021-10-29 MED ORDER — OSIMERTINIB MESYLATE 80 MG PO TABS: 80.0000 mg | ORAL_TABLET | Freq: Every day | ORAL | 1 refills | Status: DC

## 2021-10-29 MED ORDER — OSIMERTINIB MESYLATE 80 MG PO TABS
80.0000 mg | ORAL_TABLET | Freq: Every day | ORAL | 1 refills | Status: DC
  Filled 2021-10-29: qty 30, 30d supply, fill #0

## 2021-10-29 NOTE — Addendum Note (Signed)
Addended by: Darl Pikes on: 10/29/2021 02:38 PM ? ? Modules accepted: Orders ? ?

## 2021-10-29 NOTE — Telephone Encounter (Signed)
Oral Chemotherapy Pharmacist Encounter  ? ?Patient called by patient advocate Benjamine Mola and reminded that he fills his Tagrisso at [facility] through [employer] assistance program. ? ?Refill for patient was redirected the [facility] for processing.  ? ? ?Darl Pikes, PharmD, BCPS, BCOP, CPP ?Hematology/Oncology Clinical Pharmacist ?[place] Oral Chemotherapy [facility] ?[phone] ? ?10/29/2021 2:57 PM ? ?

## 2021-10-29 NOTE — Telephone Encounter (Signed)
Refill prescription sent to [facility] in error. Prescription redirected to patient's manufacturer assistance pharmacy [facility].  ?

## 2021-11-11 ENCOUNTER — Other Ambulatory Visit: Payer: Self-pay

## 2021-11-11 ENCOUNTER — Other Ambulatory Visit (HOSPITAL_COMMUNITY): Payer: Self-pay

## 2021-11-11 DIAGNOSIS — C3492 Malignant neoplasm of unspecified part of left bronchus or lung: Secondary | ICD-10-CM

## 2021-11-11 MED ORDER — OSIMERTINIB MESYLATE 80 MG PO TABS
80.0000 mg | ORAL_TABLET | Freq: Every day | ORAL | 1 refills | Status: DC
  Filled 2021-11-11 (×2): qty 30, 30d supply, fill #0

## 2021-11-13 ENCOUNTER — Other Ambulatory Visit (HOSPITAL_COMMUNITY): Payer: Self-pay

## 2021-11-18 ENCOUNTER — Other Ambulatory Visit (HOSPITAL_COMMUNITY): Payer: Self-pay

## 2021-11-20 ENCOUNTER — Encounter: Payer: Self-pay | Admitting: Internal Medicine

## 2021-12-02 ENCOUNTER — Telehealth: Payer: Self-pay | Admitting: Internal Medicine

## 2021-12-02 NOTE — Telephone Encounter (Signed)
Called patient regarding upcoming June appointments, patient is notified.  

## 2021-12-09 ENCOUNTER — Other Ambulatory Visit (HOSPITAL_COMMUNITY): Payer: Self-pay

## 2021-12-22 ENCOUNTER — Other Ambulatory Visit: Payer: Self-pay

## 2021-12-22 ENCOUNTER — Telehealth: Payer: Self-pay

## 2021-12-22 DIAGNOSIS — I2692 Saddle embolus of pulmonary artery without acute cor pulmonale: Secondary | ICD-10-CM

## 2021-12-22 MED ORDER — APIXABAN 5 MG PO TABS: 5.0000 mg | ORAL_TABLET | Freq: Two times a day (BID) | ORAL | 2 refills | Status: DC

## 2021-12-22 NOTE — Telephone Encounter (Signed)
Pt called requesting Eliquis refill. Called pt to confirm pharmacy. [facility] [address]

## 2021-12-29 ENCOUNTER — Inpatient Hospital Stay (HOSPITAL_BASED_OUTPATIENT_CLINIC_OR_DEPARTMENT_OTHER): Payer: Medicare Other | Admitting: Internal Medicine

## 2021-12-29 ENCOUNTER — Other Ambulatory Visit: Payer: Self-pay

## 2021-12-29 ENCOUNTER — Inpatient Hospital Stay: Payer: Medicare Other | Attending: Internal Medicine

## 2021-12-29 VITALS — BP 114/61 | HR 49 | Temp 97.8°F | Resp 17 | Wt 170.1 lb

## 2021-12-29 DIAGNOSIS — C3492 Malignant neoplasm of unspecified part of left bronchus or lung: Secondary | ICD-10-CM | POA: Diagnosis not present

## 2021-12-29 DIAGNOSIS — C7951 Secondary malignant neoplasm of bone: Secondary | ICD-10-CM | POA: Diagnosis not present

## 2021-12-29 DIAGNOSIS — Z86718 Personal history of other venous thrombosis and embolism: Secondary | ICD-10-CM | POA: Diagnosis not present

## 2021-12-29 DIAGNOSIS — Z7901 Long term (current) use of anticoagulants: Secondary | ICD-10-CM | POA: Insufficient documentation

## 2021-12-29 DIAGNOSIS — Z5111 Encounter for antineoplastic chemotherapy: Secondary | ICD-10-CM

## 2021-12-29 DIAGNOSIS — Z79899 Other long term (current) drug therapy: Secondary | ICD-10-CM | POA: Diagnosis not present

## 2021-12-29 DIAGNOSIS — C3490 Malignant neoplasm of unspecified part of unspecified bronchus or lung: Secondary | ICD-10-CM

## 2021-12-29 DIAGNOSIS — C3432 Malignant neoplasm of lower lobe, left bronchus or lung: Secondary | ICD-10-CM | POA: Diagnosis not present

## 2021-12-29 LAB — CBC WITH DIFFERENTIAL (CANCER CENTER ONLY)
Abs Immature Granulocytes: 0.09 10*3/uL — ABNORMAL HIGH (ref 0.00–0.07)
Basophils Absolute: 0.1 10*3/uL (ref 0.0–0.1)
Basophils Relative: 1 %
Eosinophils Absolute: 0.2 10*3/uL (ref 0.0–0.5)
Eosinophils Relative: 3 %
HCT: 39.2 % (ref 39.0–52.0)
Hemoglobin: 13.2 g/dL (ref 13.0–17.0)
Immature Granulocytes: 1 %
Lymphocytes Relative: 22 %
Lymphs Abs: 1.6 10*3/uL (ref 0.7–4.0)
MCH: 32.8 pg (ref 26.0–34.0)
MCHC: 33.7 g/dL (ref 30.0–36.0)
MCV: 97.5 fL (ref 80.0–100.0)
Monocytes Absolute: 0.8 10*3/uL (ref 0.1–1.0)
Monocytes Relative: 11 %
Neutro Abs: 4.5 10*3/uL (ref 1.7–7.7)
Neutrophils Relative %: 62 %
Platelet Count: 158 10*3/uL (ref 150–400)
RBC: 4.02 MIL/uL — ABNORMAL LOW (ref 4.22–5.81)
RDW: 14.1 % (ref 11.5–15.5)
WBC Count: 7.2 10*3/uL (ref 4.0–10.5)
nRBC: 0 % (ref 0.0–0.2)

## 2021-12-29 LAB — CMP (CANCER CENTER ONLY)
ALT: 24 U/L (ref 0–44)
AST: 26 U/L (ref 15–41)
Albumin: 4 g/dL (ref 3.5–5.0)
Alkaline Phosphatase: 111 U/L (ref 38–126)
Anion gap: 3 — ABNORMAL LOW (ref 5–15)
BUN: 17 mg/dL (ref 8–23)
CO2: 26 mmol/L (ref 22–32)
Calcium: 9.1 mg/dL (ref 8.9–10.3)
Chloride: 108 mmol/L (ref 98–111)
Creatinine: 1.31 mg/dL — ABNORMAL HIGH (ref 0.61–1.24)
GFR, Estimated: 58 mL/min — ABNORMAL LOW (ref >60)
Glucose, Bld: 95 mg/dL (ref 70–99)
Potassium: 4.5 mmol/L (ref 3.5–5.1)
Sodium: 137 mmol/L (ref 135–145)
Total Bilirubin: 0.6 mg/dL (ref 0.3–1.2)
Total Protein: 6.8 g/dL (ref 6.5–8.1)

## 2022-01-08 DIAGNOSIS — M25551 Pain in right hip: Secondary | ICD-10-CM | POA: Diagnosis not present

## 2022-01-13 ENCOUNTER — Telehealth: Payer: Self-pay | Admitting: Internal Medicine

## 2022-01-13 NOTE — Telephone Encounter (Signed)
Called patient regarding upcoming August appointment, patient has been called and notified.

## 2022-01-14 DIAGNOSIS — S76311D Strain of muscle, fascia and tendon of the posterior muscle group at thigh level, right thigh, subsequent encounter: Secondary | ICD-10-CM | POA: Diagnosis not present

## 2022-01-14 DIAGNOSIS — S76211D Strain of adductor muscle, fascia and tendon of right thigh, subsequent encounter: Secondary | ICD-10-CM | POA: Diagnosis not present

## 2022-01-19 DIAGNOSIS — S76211D Strain of adductor muscle, fascia and tendon of right thigh, subsequent encounter: Secondary | ICD-10-CM | POA: Diagnosis not present

## 2022-01-19 DIAGNOSIS — S76311D Strain of muscle, fascia and tendon of the posterior muscle group at thigh level, right thigh, subsequent encounter: Secondary | ICD-10-CM | POA: Diagnosis not present

## 2022-01-21 DIAGNOSIS — S76311D Strain of muscle, fascia and tendon of the posterior muscle group at thigh level, right thigh, subsequent encounter: Secondary | ICD-10-CM | POA: Diagnosis not present

## 2022-01-21 DIAGNOSIS — S76211D Strain of adductor muscle, fascia and tendon of right thigh, subsequent encounter: Secondary | ICD-10-CM | POA: Diagnosis not present

## 2022-01-22 DIAGNOSIS — M25551 Pain in right hip: Secondary | ICD-10-CM | POA: Diagnosis not present

## 2022-01-25 ENCOUNTER — Other Ambulatory Visit: Payer: Self-pay

## 2022-01-26 DIAGNOSIS — M25551 Pain in right hip: Secondary | ICD-10-CM | POA: Diagnosis not present

## 2022-01-27 ENCOUNTER — Other Ambulatory Visit: Payer: Self-pay | Admitting: Internal Medicine

## 2022-01-27 DIAGNOSIS — C3492 Malignant neoplasm of unspecified part of left bronchus or lung: Secondary | ICD-10-CM

## 2022-01-31 NOTE — Progress Notes (Signed)
Radiation Oncology         [phone] ________________________________  Outpatient Re-Consultation  Name: Albert Perez MRN: [id number]  Date: 02/01/2022  DOB: April 13, 1951  CC:Albert Dials, MD  Curt Bears, MD   REFERRING PHYSICIAN: Curt Bears, MD  DIAGNOSIS: There were no encounter diagnoses.  New right hip mass  Stage IV (T3, N0, M1c) non-small cell lung cancer, adenocarcinoma: presented with cavitary left lower lobe lung mass in addition to other pulmonary nodules and scattered hypermetabolic osseous metastatic disease involving the spine, ribs and bony pelvis as well as left proximal humeral pathologic fracture.   Interval Since Last Radiation: 2 years, 9 months, and 11 days    Radiation Treatment Dates: 04/11/2019 through 04/24/2019 Site Technique Total Dose (Gy) Dose per Fx (Gy) Completed Fx Beam Energies  Extremity: Ext_Lt_humerus Complex 30/30 3 10/10 10X    HISTORY OF PRESENT ILLNESS::Albert Perez is a 71 y.o. male who is accompanied by ***. he is seen as a courtesy of Dr. Julien Nordmann for re-evaluation an opinion concerning radiation therapy as part of management for his recently diagnosed right hip mass ***. I last met with the patient on 06/04/2019 for follow-up of palliative radiation therapy directed to the metastatic disease in his left arm. Since then, the patient has continued on with targeted therapy consisting of Tagrisso 80 p.o. daily (first dose on 04/21/2019), s/p 33 months of treatment without definitive evidence of disease progression. Per recent follow up visits with Dr. Julien Nordmann, the patient has been tolerating his treatment with Tagrisso fairly well with no significant adverse effects.   His most recent CT of the chest abdomen and pelvis on 10/26/21 showed stable disease, demonstrated by: unchanged post treatment appearance of a spiculated mass of the superior segment left lower lobe; unchanged right middle lobe and right lower lobe pulmonary nodules, and  no change in his mixed lytic and sclerotic osseous metastatic disease.  The patient was recently referred to Dr. Redmond Pulling at [facility] [facility] for evaluation of a hip mass that has  been increasing in size. He is scheduled to consult with Dr. Redmond Pulling on 11/20/21. During his most recent follow up visit with Dr. Julien Nordmann on 12/29/21, the patient endorsed some right hip pain which started 3 weeks prior. The patient noted difficulty with ambulation due to the pain, but stated the pain is manageable with tylenol. No further work up has been performed to evaluate his hip pain (per EMR review).    PAST MEDICAL HISTORY:  Past Medical History:  Diagnosis Date   A-fib [facility])    PMH: 2000-2001   Arthritis    Cancer ([place])    melanoma 2018   Diarrhea    due to medications   DVT (deep venous thrombosis) ([facility])    Left brachial vein DVT (01/30/19, following fracture)   Dysrhythmia    afib -    ETOH abuse    GERD (gastroesophageal reflux disease)    ocassional   Lung cancer ([place]) dx'd 02/26/2019   Metastatic cancer to bone ([place]) dx'd 02/26/2019   Sleep apnea    do not wear CPAP   Stroke ([place]) 2019   " mini"   Vertigo    Wears glasses     PAST SURGICAL HISTORY: Past Surgical History:  Procedure Laterality Date   ANKLE FRACTURE SURGERY Right 2003   COLONOSCOPY  2018   ELBOW SURGERY Left 1964   INGUINAL HERNIA REPAIR Bilateral 11/12/2020   Procedure: BILATERAL OPEN INGUINAL HERNIA REPAIR WITH MESH;  Surgeon:  Coralie Keens, MD;  Location: [facility];  Service: General;  Laterality: Bilateral;   melanoma removal  04/2017   Lt shoulder   RETINAL DETACHMENT SURGERY Right 1971   TONSILLECTOMY     VIDEO BRONCHOSCOPY WITH ENDOBRONCHIAL NAVIGATION N/A 03/28/2019   Procedure: VIDEO BRONCHOSCOPY WITH ENDOBRONCHIAL NAVIGATION and Biopsies;  Surgeon: Collene Gobble, MD;  [facility];  Service: Thoracic;  Laterality: N/A;    FAMILY HISTORY:  Family History  Problem Relation Age of Onset   Aneurysm  Mother    Colon cancer Brother    Prostate cancer Brother     SOCIAL HISTORY:  Social History   Tobacco Use   Smoking status: Every Day    Packs/day: 0.25    Years: 30.00    Total pack years: 7.50    Types: Cigarettes   Smokeless tobacco: Never   Tobacco comments:    04/27/21: Per pt, smokes 3 cigs a day now.  Vaping Use   Vaping Use: Former  Substance Use Topics   Alcohol use: Yes    Alcohol/week: 2.0 standard drinks of alcohol    Types: 2 Standard drinks or equivalent per week   Drug use: Yes    Types: Marijuana    Comment: ocassionally- last time was 11/07/20    ALLERGIES: No Known Allergies  MEDICATIONS:  Current Outpatient Medications  Medication Sig Dispense Refill   acetaminophen (TYLENOL) 650 MG CR tablet Take 650 mg by mouth every 8 (eight) hours as needed for pain.     apixaban (ELIQUIS) 5 MG TABS tablet Take 1 tablet (5 mg total) by mouth 2 (two) times daily. 60 tablet 2   buPROPion (WELLBUTRIN XL) 150 MG 24 hr tablet Take 150 mg by mouth daily as needed (nicotine cravings).     carvedilol (COREG) 6.25 MG tablet TAKE 1 TABLET BY MOUTH TWICE A DAY 180 tablet 3   loperamide (IMODIUM) 2 MG capsule Take 2 mg by mouth as needed for diarrhea or loose stools.     Multiple Vitamin (MULTIVITAMIN PO) Take 1 tablet by mouth daily. Centrum     Omega-3 Fatty Acids (OMEGA-3 PO) Take 1 capsule by mouth daily.     osimertinib mesylate (TAGRISSO) 80 MG tablet Take 1 tablet (80 mg total) by mouth daily. 30 tablet 1   OVER THE COUNTER MEDICATION Take 1 tablet by mouth daily. Standard Process, LivaPlex (Liver supplement)     rosuvastatin (CRESTOR) 5 MG tablet Take 5 mg by mouth daily.  2   TURMERIC PO Take 1 capsule by mouth daily.     vitamin B-12 (CYANOCOBALAMIN) 1000 MCG tablet Take 1,000 mcg by mouth daily.     No current facility-administered medications for this encounter.    REVIEW OF SYSTEMS:  A 10+ POINT REVIEW OF SYSTEMS WAS OBTAINED including neurology, dermatology,  psychiatry, cardiac, respiratory, lymph, extremities, GI, GU, musculoskeletal, constitutional, reproductive, HEENT. ***   PHYSICAL EXAM:  vitals were not taken for this visit.   General: Alert and oriented, in no acute distress HEENT: Head is normocephalic. Extraocular movements are intact. Oropharynx is clear. Neck: Neck is supple, no palpable cervical or supraclavicular lymphadenopathy. Heart: Regular in rate and rhythm with no murmurs, rubs, or gallops. Chest: Clear to auscultation bilaterally, with no rhonchi, wheezes, or rales. Abdomen: Soft, nontender, nondistended, with no rigidity or guarding. Extremities: No cyanosis or edema. Lymphatics: see Neck Exam Skin: No concerning lesions. Musculoskeletal: symmetric strength and muscle tone throughout. Neurologic: Cranial nerves II through XII are grossly intact.  No obvious focalities. Speech is fluent. Coordination is intact. Psychiatric: Judgment and insight are intact. Affect is appropriate. ***  ECOG = ***  0 - Asymptomatic (Fully active, able to carry on all predisease activities without restriction)  1 - Symptomatic but completely ambulatory (Restricted in physically strenuous activity but ambulatory and able to carry out work of a light or sedentary nature. For example, light housework, office work)  2 - Symptomatic, <50% in bed during the day (Ambulatory and capable of all self care but unable to carry out any work activities. Up and about more than 50% of waking hours)  3 - Symptomatic, >50% in bed, but not bedbound (Capable of only limited self-care, confined to bed or chair 50% or more of waking hours)  4 - Bedbound (Completely disabled. Cannot carry on any self-care. Totally confined to bed or chair)  5 - Death   Eustace Pen MM, Creech RH, Tormey DC, et al. [phone]). "Toxicity and response criteria of the [facility]". [place] Oncol. 5 (6): 649-55  LABORATORY DATA:  Lab Results  Component Value Date    WBC 7.2 12/29/2021   HGB 13.2 12/29/2021   HCT 39.2 12/29/2021   MCV 97.5 12/29/2021   PLT 158 12/29/2021   NEUTROABS 4.5 12/29/2021   Lab Results  Component Value Date   NA 137 12/29/2021   K 4.5 12/29/2021   CL 108 12/29/2021   CO2 26 12/29/2021   GLUCOSE 95 12/29/2021   BUN 17 12/29/2021   CREATININE 1.31 (H) 12/29/2021   CALCIUM 9.1 12/29/2021      RADIOGRAPHY: No results found.    IMPRESSION: New right hip mass  Stage IV (T3, N0, M1c) non-small cell lung cancer, adenocarcinoma: presented with cavitary left lower lobe lung mass in addition to other pulmonary nodules and scattered hypermetabolic osseous metastatic disease involving the spine, ribs and bony pelvis as well as left proximal humeral pathologic fracture.   ***  Today, I talked to the patient and family about the findings and work-up thus far.  We discussed the natural history of *** and general treatment, highlighting the role of radiotherapy in the management.  We discussed the available radiation techniques, and focused on the details of logistics and delivery.  We reviewed the anticipated acute and late sequelae associated with radiation in this setting.  The patient was encouraged to ask questions that I answered to the best of my ability. *** A patient consent form was discussed and signed.  We retained a copy for our records.  The patient would like to proceed with radiation and will be scheduled for CT simulation.  PLAN: ***    *** minutes of total time was spent for this patient encounter, including preparation, face-to-face counseling with the patient and coordination of care, physical exam, and documentation of the encounter.   ------------------------------------------------  Blair Promise, PhD, MD  This document serves as a record of services personally performed by Gery Pray, MD. It was created on his behalf by Roney Mans, a trained medical scribe. The creation of this record is based on  the scribe's personal observations and the provider's statements to them. This document has been checked and approved by the attending provider.

## 2022-02-01 ENCOUNTER — Ambulatory Visit
Admission: RE | Admit: 2022-02-01 | Discharge: 2022-02-01 | Disposition: A | Discharge status: Home / Self Care | Payer: Medicare Other | Source: Ambulatory Visit | Attending: Radiation Oncology | Admitting: Radiation Oncology

## 2022-02-01 ENCOUNTER — Other Ambulatory Visit: Payer: Self-pay

## 2022-02-01 VITALS — BP 144/71 | HR 52 | Temp 98.7°F | Resp 20 | Ht 67.0 in | Wt 169.0 lb

## 2022-02-01 VITALS — BP 154/71 | HR 52 | Temp 98.7°F | Resp 20 | Ht 67.0 in | Wt 169.0 lb

## 2022-02-01 DIAGNOSIS — C3432 Malignant neoplasm of lower lobe, left bronchus or lung: Secondary | ICD-10-CM | POA: Diagnosis not present

## 2022-02-01 DIAGNOSIS — Z86718 Personal history of other venous thrombosis and embolism: Secondary | ICD-10-CM | POA: Insufficient documentation

## 2022-02-01 DIAGNOSIS — K219 Gastro-esophageal reflux disease without esophagitis: Secondary | ICD-10-CM | POA: Insufficient documentation

## 2022-02-01 DIAGNOSIS — F17210 Nicotine dependence, cigarettes, uncomplicated: Secondary | ICD-10-CM | POA: Insufficient documentation

## 2022-02-01 DIAGNOSIS — Z85820 Personal history of malignant melanoma of skin: Secondary | ICD-10-CM | POA: Insufficient documentation

## 2022-02-01 DIAGNOSIS — C7951 Secondary malignant neoplasm of bone: Secondary | ICD-10-CM | POA: Insufficient documentation

## 2022-02-01 DIAGNOSIS — M1990 Unspecified osteoarthritis, unspecified site: Secondary | ICD-10-CM | POA: Diagnosis not present

## 2022-02-01 DIAGNOSIS — C3492 Malignant neoplasm of unspecified part of left bronchus or lung: Secondary | ICD-10-CM

## 2022-02-01 DIAGNOSIS — G4730 Sleep apnea, unspecified: Secondary | ICD-10-CM | POA: Insufficient documentation

## 2022-02-01 DIAGNOSIS — C3490 Malignant neoplasm of unspecified part of unspecified bronchus or lung: Secondary | ICD-10-CM

## 2022-02-01 DIAGNOSIS — Z8673 Personal history of transient ischemic attack (TIA), and cerebral infarction without residual deficits: Secondary | ICD-10-CM | POA: Insufficient documentation

## 2022-02-01 DIAGNOSIS — Z7901 Long term (current) use of anticoagulants: Secondary | ICD-10-CM | POA: Insufficient documentation

## 2022-02-01 DIAGNOSIS — R2241 Localized swelling, mass and lump, right lower limb: Secondary | ICD-10-CM | POA: Diagnosis not present

## 2022-02-01 DIAGNOSIS — Z79899 Other long term (current) drug therapy: Secondary | ICD-10-CM | POA: Insufficient documentation

## 2022-02-01 DIAGNOSIS — I4891 Unspecified atrial fibrillation: Secondary | ICD-10-CM | POA: Diagnosis not present

## 2022-02-01 NOTE — Progress Notes (Signed)
Histology and Location of Primary Cancer: non-small cell lung cancer  Location(s) of Symptomatic Metastases: right hip  Past/Anticipated chemotherapy by medical oncology, if any: Targeted therapy with Tagrisso 80 p.o. daily.  He started the first dose on April 21, 2019.  Status post 32 months of treatment.  Pain on a scale of 0-10 is: 8 in his right hip.  Takes Percocet as needed.  Taking 2.5 tablets per day.   Ambulatory status? Walker? Wheelchair?: Ambulatory with Assistance  SAFETY ISSUES: Prior radiation? Left humerus 04/11/2019 - 04/24/2019 30 Gy Pacemaker/ICD? no Possible current pregnancy? no Is the patient on methotrexate? no  Current Complaints / other details:  Had an MRI last week at [facility] which shows a fracture in his right hip.  He is scheduled to see Dr. Redmond Pulling tomorrow.

## 2022-02-02 ENCOUNTER — Other Ambulatory Visit: Payer: Self-pay | Admitting: Radiation Oncology

## 2022-02-02 ENCOUNTER — Telehealth: Payer: Self-pay

## 2022-02-02 ENCOUNTER — Ambulatory Visit
Admission: RE | Admit: 2022-02-02 | Discharge: 2022-02-02 | Disposition: A | Discharge status: Home / Self Care | Payer: Self-pay | Source: Ambulatory Visit | Attending: Radiation Oncology | Admitting: Radiation Oncology

## 2022-02-02 DIAGNOSIS — C3490 Malignant neoplasm of unspecified part of unspecified bronchus or lung: Secondary | ICD-10-CM | POA: Diagnosis not present

## 2022-02-02 DIAGNOSIS — C7951 Secondary malignant neoplasm of bone: Secondary | ICD-10-CM | POA: Diagnosis not present

## 2022-02-02 DIAGNOSIS — C3492 Malignant neoplasm of unspecified part of left bronchus or lung: Secondary | ICD-10-CM

## 2022-02-02 NOTE — Telephone Encounter (Signed)
Received a call from patient stating he wishes to start radiation treatment asap. Patient has CT sim appointment on 02/04/22 @ 10am. Patient asked if his sim appointment could be moved up to  02/02/22 or 02/03/22?

## 2022-02-03 ENCOUNTER — Ambulatory Visit: Payer: Medicare Other | Admitting: Radiation Oncology

## 2022-02-03 DIAGNOSIS — C7951 Secondary malignant neoplasm of bone: Secondary | ICD-10-CM | POA: Diagnosis not present

## 2022-02-03 DIAGNOSIS — C3432 Malignant neoplasm of lower lobe, left bronchus or lung: Secondary | ICD-10-CM | POA: Diagnosis not present

## 2022-02-03 DIAGNOSIS — F17210 Nicotine dependence, cigarettes, uncomplicated: Secondary | ICD-10-CM | POA: Diagnosis not present

## 2022-02-04 ENCOUNTER — Ambulatory Visit
Admission: RE | Admit: 2022-02-04 | Discharge: 2022-02-04 | Disposition: A | Discharge status: Home / Self Care | Payer: Medicare Other | Source: Ambulatory Visit | Attending: Radiation Oncology | Admitting: Radiation Oncology

## 2022-02-04 ENCOUNTER — Telehealth: Payer: Self-pay | Admitting: *Deleted

## 2022-02-04 ENCOUNTER — Other Ambulatory Visit: Payer: Self-pay

## 2022-02-04 DIAGNOSIS — C3432 Malignant neoplasm of lower lobe, left bronchus or lung: Secondary | ICD-10-CM | POA: Insufficient documentation

## 2022-02-04 DIAGNOSIS — Z510 Encounter for antineoplastic radiation therapy: Secondary | ICD-10-CM | POA: Insufficient documentation

## 2022-02-04 DIAGNOSIS — F17210 Nicotine dependence, cigarettes, uncomplicated: Secondary | ICD-10-CM | POA: Diagnosis not present

## 2022-02-04 DIAGNOSIS — C3492 Malignant neoplasm of unspecified part of left bronchus or lung: Secondary | ICD-10-CM

## 2022-02-04 DIAGNOSIS — C7951 Secondary malignant neoplasm of bone: Secondary | ICD-10-CM | POA: Diagnosis not present

## 2022-02-04 MED ORDER — OSIMERTINIB MESYLATE 80 MG PO TABS: 80.0000 mg | ORAL_TABLET | Freq: Every day | ORAL | 1 refills | Status: DC

## 2022-02-04 MED ORDER — OXYCODONE-ACETAMINOPHEN 5-325 MG PO TABS: 1.0000 | ORAL_TABLET | ORAL | 0 refills | Status: DC | PRN

## 2022-02-04 NOTE — Telephone Encounter (Signed)
Patient called and requested refill of Tagrisso. Per Dr. Julien Nordmann most recent [facility] note 12/29/21, medication is continued at same dose.  Refill escribed to [facility] per note from Darl Pikes, PharmD with [facility]: "he fills his Tagrisso at [facility] through [facility] assistance program".

## 2022-02-07 DIAGNOSIS — F17210 Nicotine dependence, cigarettes, uncomplicated: Secondary | ICD-10-CM | POA: Diagnosis not present

## 2022-02-07 DIAGNOSIS — C3432 Malignant neoplasm of lower lobe, left bronchus or lung: Secondary | ICD-10-CM | POA: Diagnosis not present

## 2022-02-07 DIAGNOSIS — Z510 Encounter for antineoplastic radiation therapy: Secondary | ICD-10-CM | POA: Diagnosis not present

## 2022-02-07 DIAGNOSIS — C7951 Secondary malignant neoplasm of bone: Secondary | ICD-10-CM | POA: Diagnosis not present

## 2022-02-08 ENCOUNTER — Other Ambulatory Visit: Payer: Self-pay

## 2022-02-08 ENCOUNTER — Ambulatory Visit
Admission: RE | Admit: 2022-02-08 | Discharge: 2022-02-08 | Disposition: A | Discharge status: Home / Self Care | Payer: Medicare Other | Source: Ambulatory Visit | Attending: Radiation Oncology | Admitting: Radiation Oncology

## 2022-02-08 ENCOUNTER — Other Ambulatory Visit: Payer: Self-pay | Admitting: Radiation Oncology

## 2022-02-08 DIAGNOSIS — C7951 Secondary malignant neoplasm of bone: Secondary | ICD-10-CM | POA: Diagnosis not present

## 2022-02-08 DIAGNOSIS — F17210 Nicotine dependence, cigarettes, uncomplicated: Secondary | ICD-10-CM | POA: Diagnosis not present

## 2022-02-08 DIAGNOSIS — Z510 Encounter for antineoplastic radiation therapy: Secondary | ICD-10-CM | POA: Diagnosis not present

## 2022-02-08 DIAGNOSIS — C3492 Malignant neoplasm of unspecified part of left bronchus or lung: Secondary | ICD-10-CM

## 2022-02-08 DIAGNOSIS — C3432 Malignant neoplasm of lower lobe, left bronchus or lung: Secondary | ICD-10-CM | POA: Diagnosis not present

## 2022-02-08 LAB — RAD ONC ARIA SESSION SUMMARY
Course Elapsed Days: 0
Plan Fractions Treated to Date: 1
Plan Prescribed Dose Per Fraction: 3 Gy
Plan Total Fractions Prescribed: 10
Plan Total Prescribed Dose: 30 Gy
Reference Point Dosage Given to Date: 3 Gy
Reference Point Session Dosage Given: 3 Gy
Session Number: 1

## 2022-02-09 ENCOUNTER — Other Ambulatory Visit: Payer: Self-pay | Admitting: Radiation Oncology

## 2022-02-09 ENCOUNTER — Ambulatory Visit
Admission: RE | Admit: 2022-02-09 | Discharge: 2022-02-09 | Disposition: A | Discharge status: Home / Self Care | Payer: Medicare Other | Source: Ambulatory Visit | Attending: Radiation Oncology | Admitting: Radiation Oncology

## 2022-02-09 ENCOUNTER — Other Ambulatory Visit: Payer: Self-pay

## 2022-02-09 ENCOUNTER — Ambulatory Visit: Payer: Medicare Other

## 2022-02-09 ENCOUNTER — Telehealth: Payer: Self-pay | Admitting: Medical Oncology

## 2022-02-09 ENCOUNTER — Ambulatory Visit: Payer: Medicare Other | Admitting: Radiation Oncology

## 2022-02-09 DIAGNOSIS — C3432 Malignant neoplasm of lower lobe, left bronchus or lung: Secondary | ICD-10-CM | POA: Diagnosis not present

## 2022-02-09 DIAGNOSIS — C3492 Malignant neoplasm of unspecified part of left bronchus or lung: Secondary | ICD-10-CM

## 2022-02-09 DIAGNOSIS — F17210 Nicotine dependence, cigarettes, uncomplicated: Secondary | ICD-10-CM | POA: Diagnosis not present

## 2022-02-09 DIAGNOSIS — C7951 Secondary malignant neoplasm of bone: Secondary | ICD-10-CM | POA: Diagnosis not present

## 2022-02-09 DIAGNOSIS — Z510 Encounter for antineoplastic radiation therapy: Secondary | ICD-10-CM | POA: Diagnosis not present

## 2022-02-09 LAB — RAD ONC ARIA SESSION SUMMARY
Course Elapsed Days: 1
Plan Fractions Treated to Date: 2
Plan Prescribed Dose Per Fraction: 3 Gy
Plan Total Fractions Prescribed: 10
Plan Total Prescribed Dose: 30 Gy
Reference Point Dosage Given to Date: 6 Gy
Reference Point Session Dosage Given: 3 Gy
Session Number: 2

## 2022-02-09 MED ORDER — OXYCODONE-ACETAMINOPHEN 10-325 MG PO TABS: 1.0000 | ORAL_TABLET | ORAL | 0 refills | Status: DC | PRN

## 2022-02-09 MED ORDER — OSIMERTINIB MESYLATE 80 MG PO TABS: 80.0000 mg | ORAL_TABLET | Freq: Every day | ORAL | 1 refills | Status: DC

## 2022-02-09 NOTE — Telephone Encounter (Signed)
I told pt his tagrisso rx is being expedited for delivery. Pt knows to call [facility] and [facility] when he needs refills.

## 2022-02-10 ENCOUNTER — Ambulatory Visit
Admission: RE | Admit: 2022-02-10 | Discharge: 2022-02-10 | Disposition: A | Discharge status: Home / Self Care | Payer: Medicare Other | Source: Ambulatory Visit | Attending: Radiation Oncology | Admitting: Radiation Oncology

## 2022-02-10 ENCOUNTER — Other Ambulatory Visit: Payer: Self-pay

## 2022-02-10 ENCOUNTER — Ambulatory Visit: Payer: Medicare Other

## 2022-02-10 DIAGNOSIS — C3432 Malignant neoplasm of lower lobe, left bronchus or lung: Secondary | ICD-10-CM | POA: Diagnosis not present

## 2022-02-10 DIAGNOSIS — C7951 Secondary malignant neoplasm of bone: Secondary | ICD-10-CM | POA: Diagnosis not present

## 2022-02-10 DIAGNOSIS — F17210 Nicotine dependence, cigarettes, uncomplicated: Secondary | ICD-10-CM | POA: Diagnosis not present

## 2022-02-10 DIAGNOSIS — Z510 Encounter for antineoplastic radiation therapy: Secondary | ICD-10-CM | POA: Diagnosis not present

## 2022-02-10 LAB — RAD ONC ARIA SESSION SUMMARY
Course Elapsed Days: 2
Plan Fractions Treated to Date: 3
Plan Prescribed Dose Per Fraction: 3 Gy
Plan Total Fractions Prescribed: 10
Plan Total Prescribed Dose: 30 Gy
Reference Point Dosage Given to Date: 9 Gy
Reference Point Session Dosage Given: 3 Gy
Session Number: 3

## 2022-02-11 ENCOUNTER — Ambulatory Visit: Payer: Medicare Other

## 2022-02-11 ENCOUNTER — Other Ambulatory Visit: Payer: Self-pay

## 2022-02-11 ENCOUNTER — Ambulatory Visit
Admission: RE | Admit: 2022-02-11 | Discharge: 2022-02-11 | Disposition: A | Discharge status: Home / Self Care | Payer: Medicare Other | Source: Ambulatory Visit | Attending: Radiation Oncology | Admitting: Radiation Oncology

## 2022-02-11 DIAGNOSIS — C7951 Secondary malignant neoplasm of bone: Secondary | ICD-10-CM | POA: Diagnosis not present

## 2022-02-11 DIAGNOSIS — Z510 Encounter for antineoplastic radiation therapy: Secondary | ICD-10-CM | POA: Diagnosis not present

## 2022-02-11 DIAGNOSIS — F17210 Nicotine dependence, cigarettes, uncomplicated: Secondary | ICD-10-CM | POA: Diagnosis not present

## 2022-02-11 DIAGNOSIS — C3432 Malignant neoplasm of lower lobe, left bronchus or lung: Secondary | ICD-10-CM | POA: Diagnosis not present

## 2022-02-11 LAB — RAD ONC ARIA SESSION SUMMARY
Course Elapsed Days: 3
Plan Fractions Treated to Date: 4
Plan Prescribed Dose Per Fraction: 3 Gy
Plan Total Fractions Prescribed: 10
Plan Total Prescribed Dose: 30 Gy
Reference Point Dosage Given to Date: 12 Gy
Reference Point Session Dosage Given: 3 Gy
Session Number: 4

## 2022-02-12 ENCOUNTER — Ambulatory Visit: Payer: Medicare Other

## 2022-02-12 ENCOUNTER — Other Ambulatory Visit: Payer: Self-pay

## 2022-02-12 ENCOUNTER — Ambulatory Visit
Admission: RE | Admit: 2022-02-12 | Discharge: 2022-02-12 | Disposition: A | Discharge status: Home / Self Care | Payer: Medicare Other | Source: Ambulatory Visit | Attending: Radiation Oncology | Admitting: Radiation Oncology

## 2022-02-12 DIAGNOSIS — Z510 Encounter for antineoplastic radiation therapy: Secondary | ICD-10-CM | POA: Diagnosis not present

## 2022-02-12 DIAGNOSIS — C3432 Malignant neoplasm of lower lobe, left bronchus or lung: Secondary | ICD-10-CM | POA: Diagnosis not present

## 2022-02-12 DIAGNOSIS — F17210 Nicotine dependence, cigarettes, uncomplicated: Secondary | ICD-10-CM | POA: Diagnosis not present

## 2022-02-12 DIAGNOSIS — C7951 Secondary malignant neoplasm of bone: Secondary | ICD-10-CM | POA: Diagnosis not present

## 2022-02-12 LAB — RAD ONC ARIA SESSION SUMMARY
Course Elapsed Days: 4
Plan Fractions Treated to Date: 5
Plan Prescribed Dose Per Fraction: 3 Gy
Plan Total Fractions Prescribed: 10
Plan Total Prescribed Dose: 30 Gy
Reference Point Dosage Given to Date: 15 Gy
Reference Point Session Dosage Given: 3 Gy
Session Number: 5

## 2022-02-13 ENCOUNTER — Other Ambulatory Visit: Payer: Self-pay

## 2022-02-15 ENCOUNTER — Ambulatory Visit: Payer: Medicare Other

## 2022-02-15 ENCOUNTER — Ambulatory Visit
Admission: RE | Admit: 2022-02-15 | Discharge: 2022-02-15 | Disposition: A | Discharge status: Home / Self Care | Payer: Medicare Other | Source: Ambulatory Visit | Attending: Radiation Oncology | Admitting: Radiation Oncology

## 2022-02-15 ENCOUNTER — Ambulatory Visit: Payer: Medicare Other | Admitting: Radiation Oncology

## 2022-02-15 ENCOUNTER — Other Ambulatory Visit: Payer: Self-pay

## 2022-02-15 DIAGNOSIS — F17210 Nicotine dependence, cigarettes, uncomplicated: Secondary | ICD-10-CM | POA: Diagnosis not present

## 2022-02-15 DIAGNOSIS — C3432 Malignant neoplasm of lower lobe, left bronchus or lung: Secondary | ICD-10-CM | POA: Diagnosis not present

## 2022-02-15 DIAGNOSIS — C7951 Secondary malignant neoplasm of bone: Secondary | ICD-10-CM | POA: Diagnosis not present

## 2022-02-15 DIAGNOSIS — Z510 Encounter for antineoplastic radiation therapy: Secondary | ICD-10-CM | POA: Diagnosis not present

## 2022-02-15 LAB — RAD ONC ARIA SESSION SUMMARY
Course Elapsed Days: 7
Plan Fractions Treated to Date: 6
Plan Prescribed Dose Per Fraction: 3 Gy
Plan Total Fractions Prescribed: 10
Plan Total Prescribed Dose: 30 Gy
Reference Point Dosage Given to Date: 18 Gy
Reference Point Session Dosage Given: 3 Gy
Session Number: 6

## 2022-02-16 ENCOUNTER — Ambulatory Visit: Payer: Medicare Other

## 2022-02-16 ENCOUNTER — Ambulatory Visit
Admission: RE | Admit: 2022-02-16 | Discharge: 2022-02-16 | Disposition: A | Discharge status: Home / Self Care | Payer: Medicare Other | Source: Ambulatory Visit | Attending: Radiation Oncology | Admitting: Radiation Oncology

## 2022-02-16 ENCOUNTER — Other Ambulatory Visit: Payer: Self-pay

## 2022-02-16 ENCOUNTER — Other Ambulatory Visit: Payer: Self-pay | Admitting: Radiation Oncology

## 2022-02-16 DIAGNOSIS — F17210 Nicotine dependence, cigarettes, uncomplicated: Secondary | ICD-10-CM | POA: Diagnosis not present

## 2022-02-16 DIAGNOSIS — C7951 Secondary malignant neoplasm of bone: Secondary | ICD-10-CM | POA: Diagnosis not present

## 2022-02-16 DIAGNOSIS — C3432 Malignant neoplasm of lower lobe, left bronchus or lung: Secondary | ICD-10-CM | POA: Diagnosis not present

## 2022-02-16 DIAGNOSIS — Z510 Encounter for antineoplastic radiation therapy: Secondary | ICD-10-CM | POA: Diagnosis not present

## 2022-02-16 LAB — RAD ONC ARIA SESSION SUMMARY
Course Elapsed Days: 8
Plan Fractions Treated to Date: 7
Plan Prescribed Dose Per Fraction: 3 Gy
Plan Total Fractions Prescribed: 10
Plan Total Prescribed Dose: 30 Gy
Reference Point Dosage Given to Date: 21 Gy
Reference Point Session Dosage Given: 3 Gy
Session Number: 7

## 2022-02-16 MED ORDER — OXYCODONE-ACETAMINOPHEN 10-325 MG PO TABS: 1.0000 | ORAL_TABLET | ORAL | 0 refills | Status: DC | PRN

## 2022-02-17 ENCOUNTER — Other Ambulatory Visit: Payer: Self-pay

## 2022-02-17 ENCOUNTER — Ambulatory Visit: Payer: Medicare Other

## 2022-02-17 ENCOUNTER — Ambulatory Visit
Admission: RE | Admit: 2022-02-17 | Discharge: 2022-02-17 | Disposition: A | Discharge status: Home / Self Care | Payer: Medicare Other | Source: Ambulatory Visit | Attending: Radiation Oncology | Admitting: Radiation Oncology

## 2022-02-17 DIAGNOSIS — Z510 Encounter for antineoplastic radiation therapy: Secondary | ICD-10-CM | POA: Diagnosis not present

## 2022-02-17 DIAGNOSIS — C3432 Malignant neoplasm of lower lobe, left bronchus or lung: Secondary | ICD-10-CM | POA: Diagnosis not present

## 2022-02-17 DIAGNOSIS — C7951 Secondary malignant neoplasm of bone: Secondary | ICD-10-CM | POA: Diagnosis not present

## 2022-02-17 DIAGNOSIS — F17210 Nicotine dependence, cigarettes, uncomplicated: Secondary | ICD-10-CM | POA: Diagnosis not present

## 2022-02-17 LAB — RAD ONC ARIA SESSION SUMMARY
Course Elapsed Days: 9
Plan Fractions Treated to Date: 8
Plan Prescribed Dose Per Fraction: 3 Gy
Plan Total Fractions Prescribed: 10
Plan Total Prescribed Dose: 30 Gy
Reference Point Dosage Given to Date: 24 Gy
Reference Point Session Dosage Given: 3 Gy
Session Number: 8

## 2022-02-18 ENCOUNTER — Other Ambulatory Visit: Payer: Self-pay

## 2022-02-18 ENCOUNTER — Ambulatory Visit: Payer: Medicare Other

## 2022-02-18 ENCOUNTER — Ambulatory Visit
Admission: RE | Admit: 2022-02-18 | Discharge: 2022-02-18 | Disposition: A | Discharge status: Home / Self Care | Payer: Medicare Other | Source: Ambulatory Visit | Attending: Radiation Oncology | Admitting: Radiation Oncology

## 2022-02-18 DIAGNOSIS — F17210 Nicotine dependence, cigarettes, uncomplicated: Secondary | ICD-10-CM | POA: Diagnosis not present

## 2022-02-18 DIAGNOSIS — Z510 Encounter for antineoplastic radiation therapy: Secondary | ICD-10-CM | POA: Diagnosis not present

## 2022-02-18 DIAGNOSIS — C7951 Secondary malignant neoplasm of bone: Secondary | ICD-10-CM | POA: Diagnosis not present

## 2022-02-18 DIAGNOSIS — C3432 Malignant neoplasm of lower lobe, left bronchus or lung: Secondary | ICD-10-CM | POA: Diagnosis not present

## 2022-02-18 DIAGNOSIS — C3492 Malignant neoplasm of unspecified part of left bronchus or lung: Secondary | ICD-10-CM

## 2022-02-18 LAB — RAD ONC ARIA SESSION SUMMARY
Course Elapsed Days: 10
Plan Fractions Treated to Date: 9
Plan Prescribed Dose Per Fraction: 3 Gy
Plan Total Fractions Prescribed: 10
Plan Total Prescribed Dose: 30 Gy
Reference Point Dosage Given to Date: 27 Gy
Reference Point Session Dosage Given: 3 Gy
Session Number: 9

## 2022-02-18 MED ORDER — OSIMERTINIB MESYLATE 80 MG PO TABS: 80.0000 mg | ORAL_TABLET | Freq: Every day | ORAL | 1 refills | Status: DC

## 2022-02-18 NOTE — Telephone Encounter (Signed)
Repeated incoming faxs from [facility] received indicating rx refill needed. Last rx sent 02/09/22 for 30tabs with one additional refill.  I have called [facility] and spoke with Roxanne who advised that 60tabs were sent at once on 02/10/22, therefore they have no refills left on file for the pt and this is the reason the request is being made.  Discussed with Cassandra, PA-C who gave a verbal auth to refill.

## 2022-02-19 ENCOUNTER — Ambulatory Visit
Admission: RE | Admit: 2022-02-19 | Discharge: 2022-02-19 | Disposition: A | Discharge status: Home / Self Care | Payer: Medicare Other | Source: Ambulatory Visit | Attending: Radiation Oncology | Admitting: Radiation Oncology

## 2022-02-19 ENCOUNTER — Encounter: Payer: Self-pay | Admitting: Radiation Oncology

## 2022-02-19 ENCOUNTER — Ambulatory Visit: Payer: Medicare Other

## 2022-02-19 ENCOUNTER — Other Ambulatory Visit: Payer: Self-pay

## 2022-02-19 DIAGNOSIS — C3432 Malignant neoplasm of lower lobe, left bronchus or lung: Secondary | ICD-10-CM | POA: Diagnosis not present

## 2022-02-19 DIAGNOSIS — Z510 Encounter for antineoplastic radiation therapy: Secondary | ICD-10-CM | POA: Diagnosis not present

## 2022-02-19 DIAGNOSIS — F17210 Nicotine dependence, cigarettes, uncomplicated: Secondary | ICD-10-CM | POA: Diagnosis not present

## 2022-02-19 DIAGNOSIS — C7951 Secondary malignant neoplasm of bone: Secondary | ICD-10-CM | POA: Diagnosis not present

## 2022-02-19 LAB — RAD ONC ARIA SESSION SUMMARY
Course Elapsed Days: 11
Plan Fractions Treated to Date: 10
Plan Prescribed Dose Per Fraction: 3 Gy
Plan Total Fractions Prescribed: 10
Plan Total Prescribed Dose: 30 Gy
Reference Point Dosage Given to Date: 30 Gy
Reference Point Session Dosage Given: 3 Gy
Session Number: 10

## 2022-02-22 ENCOUNTER — Ambulatory Visit: Payer: Medicare Other

## 2022-02-23 ENCOUNTER — Ambulatory Visit: Payer: Medicare Other

## 2022-02-24 ENCOUNTER — Ambulatory Visit: Payer: Medicare Other

## 2022-02-25 ENCOUNTER — Ambulatory Visit: Payer: Medicare Other

## 2022-02-26 ENCOUNTER — Ambulatory Visit (HOSPITAL_COMMUNITY)
Admission: RE | Admit: 2022-02-26 | Discharge: 2022-02-26 | Disposition: A | Discharge status: Home / Self Care | Payer: Medicare Other | Source: Ambulatory Visit | Attending: Internal Medicine | Admitting: Internal Medicine

## 2022-02-26 ENCOUNTER — Other Ambulatory Visit: Payer: Self-pay

## 2022-02-26 ENCOUNTER — Inpatient Hospital Stay: Payer: Medicare Other | Attending: Internal Medicine

## 2022-02-26 ENCOUNTER — Ambulatory Visit: Payer: Medicare Other

## 2022-02-26 DIAGNOSIS — C3490 Malignant neoplasm of unspecified part of unspecified bronchus or lung: Secondary | ICD-10-CM

## 2022-02-26 DIAGNOSIS — N3289 Other specified disorders of bladder: Secondary | ICD-10-CM | POA: Diagnosis not present

## 2022-02-26 DIAGNOSIS — C3432 Malignant neoplasm of lower lobe, left bronchus or lung: Secondary | ICD-10-CM | POA: Insufficient documentation

## 2022-02-26 DIAGNOSIS — C7951 Secondary malignant neoplasm of bone: Secondary | ICD-10-CM | POA: Insufficient documentation

## 2022-02-26 DIAGNOSIS — Z79899 Other long term (current) drug therapy: Secondary | ICD-10-CM | POA: Insufficient documentation

## 2022-02-26 DIAGNOSIS — Z7901 Long term (current) use of anticoagulants: Secondary | ICD-10-CM | POA: Insufficient documentation

## 2022-02-26 DIAGNOSIS — J439 Emphysema, unspecified: Secondary | ICD-10-CM | POA: Diagnosis not present

## 2022-02-26 LAB — CMP (CANCER CENTER ONLY)
ALT: 21 U/L (ref 0–44)
AST: 18 U/L (ref 15–41)
Albumin: 4.2 g/dL (ref 3.5–5.0)
Alkaline Phosphatase: 117 U/L (ref 38–126)
Anion gap: 5 (ref 5–15)
BUN: 22 mg/dL (ref 8–23)
CO2: 28 mmol/L (ref 22–32)
Calcium: 9.8 mg/dL (ref 8.9–10.3)
Chloride: 105 mmol/L (ref 98–111)
Creatinine: 1.11 mg/dL (ref 0.61–1.24)
GFR, Estimated: >60 mL/min (ref >60)
Glucose, Bld: 109 mg/dL — ABNORMAL HIGH (ref 70–99)
Potassium: 4.1 mmol/L (ref 3.5–5.1)
Sodium: 138 mmol/L (ref 135–145)
Total Bilirubin: 0.7 mg/dL (ref 0.3–1.2)
Total Protein: 6.9 g/dL (ref 6.5–8.1)

## 2022-02-26 LAB — CBC WITH DIFFERENTIAL (CANCER CENTER ONLY)
Abs Immature Granulocytes: 0.03 10*3/uL (ref 0.00–0.07)
Basophils Absolute: 0 10*3/uL (ref 0.0–0.1)
Basophils Relative: 1 %
Eosinophils Absolute: 0.1 10*3/uL (ref 0.0–0.5)
Eosinophils Relative: 2 %
HCT: 37.5 % — ABNORMAL LOW (ref 39.0–52.0)
Hemoglobin: 13 g/dL (ref 13.0–17.0)
Immature Granulocytes: 1 %
Lymphocytes Relative: 19 %
Lymphs Abs: 1.1 10*3/uL (ref 0.7–4.0)
MCH: 32.7 pg (ref 26.0–34.0)
MCHC: 34.7 g/dL (ref 30.0–36.0)
MCV: 94.5 fL (ref 80.0–100.0)
Monocytes Absolute: 0.7 10*3/uL (ref 0.1–1.0)
Monocytes Relative: 12 %
Neutro Abs: 4 10*3/uL (ref 1.7–7.7)
Neutrophils Relative %: 65 %
Platelet Count: 196 10*3/uL (ref 150–400)
RBC: 3.97 MIL/uL — ABNORMAL LOW (ref 4.22–5.81)
RDW: 13.2 % (ref 11.5–15.5)
WBC Count: 6 10*3/uL (ref 4.0–10.5)
nRBC: 0 % (ref 0.0–0.2)

## 2022-02-26 MED ORDER — IOHEXOL 300 MG/ML  SOLN
100.0000 mL | Freq: Once | INTRAMUSCULAR | Status: AC | PRN
  Administered 2022-02-26: 100 mL via INTRAVENOUS

## 2022-02-26 MED ORDER — SODIUM CHLORIDE (PF) 0.9 % IJ SOLN
INTRAMUSCULAR | Status: AC
  Filled 2022-02-26: qty 50

## 2022-03-01 ENCOUNTER — Encounter: Payer: Self-pay | Admitting: *Deleted

## 2022-03-01 ENCOUNTER — Other Ambulatory Visit: Payer: Self-pay | Admitting: Radiation Oncology

## 2022-03-01 ENCOUNTER — Inpatient Hospital Stay (HOSPITAL_BASED_OUTPATIENT_CLINIC_OR_DEPARTMENT_OTHER): Payer: Medicare Other | Admitting: Internal Medicine

## 2022-03-01 ENCOUNTER — Telehealth: Payer: Self-pay | Admitting: Oncology

## 2022-03-01 ENCOUNTER — Other Ambulatory Visit: Payer: Self-pay

## 2022-03-01 DIAGNOSIS — C3432 Malignant neoplasm of lower lobe, left bronchus or lung: Secondary | ICD-10-CM | POA: Diagnosis not present

## 2022-03-01 DIAGNOSIS — Z7901 Long term (current) use of anticoagulants: Secondary | ICD-10-CM | POA: Diagnosis not present

## 2022-03-01 DIAGNOSIS — C3490 Malignant neoplasm of unspecified part of unspecified bronchus or lung: Secondary | ICD-10-CM

## 2022-03-01 DIAGNOSIS — C7951 Secondary malignant neoplasm of bone: Secondary | ICD-10-CM | POA: Diagnosis not present

## 2022-03-01 DIAGNOSIS — Z79899 Other long term (current) drug therapy: Secondary | ICD-10-CM | POA: Diagnosis not present

## 2022-03-01 MED ORDER — OXYCODONE-ACETAMINOPHEN 10-325 MG PO TABS: 1.0000 | ORAL_TABLET | ORAL | 0 refills | Status: DC | PRN

## 2022-03-01 NOTE — Telephone Encounter (Signed)
Albert Perez called and said the pain in his right hip and groin area has improved a little from radiation but it is still painful and hard to walk.  He is rating the pain at a 4/5 out of 10 and said it is keeping him up at night.  He is wondering if Dr. Sondra Come can refill his oxycodone/acetaminophen.  He also asked if he should get another MRI or wait until he sees Dr. Redmond Pulling on 03/16/22.

## 2022-03-01 NOTE — Progress Notes (Signed)
[place] Telephone:(336) [phone]   Fax:(336) [phone]  OFFICE PROGRESS NOTE  Aura Dials, MD [place] [address]  DIAGNOSIS: Stage IV (T3, N0, M1c) non-small cell lung cancer, adenocarcinoma presented with cavitary left lower lobe lung mass in addition to other pulmonary nodules and scattered hypermetabolic osseous metastatic disease involving the spine, ribs and bony pelvis as well as left proximal humeral pathologic fracture.    Molecular Biomarkers performed by [employer](S) / [name]      [id number]  Afatinib, Dacomitinib, Erlotinib, Gefitinib, Osimertinib, Ramucirumab Neratinib   FGFR1Amplification  Erdafitinib, Lenvatinib, Nintedanib, Pazopanib, Pemigatinib, Ponatinib   [id number] None   PRIOR THERAPY: None   CURRENT THERAPY:  1) Palliative radiotherapy to the the left humerus under the care of Dr. Sondra Come. Last treatment on 04/24/2019 2) Targeted therapy with Tagrisso 80 p.o. daily.  He started the first dose on April 21, 2019.  Status post 32 months of treatment. 3) palliative radiotherapy to the right acetabulum under the care of Dr. Sondra Come.  INTERVAL HISTORY: Albert Perez 71 y.o. male returns to the clinic today for follow-up visit accompanied by his wife.  The patient is feeling fine today with no concerning complaints except for the pain on the right hip area.  He was seen recently by orthopedic surgery and MRI of the hip showed lytic lesion and the right acetabulum.  The patient underwent palliative radiotherapy under the care of Dr. Sondra Come.  He is currently on pain medication with Percocet 10/325 mg but he continues to have pain and he takes a lot of Percocet specially at nighttime.  He is scheduled to see an orthopedic surgeon Dr. Redmond Pulling at [facility] for surgical intervention after completing his palliative radiotherapy.  He denied having any nausea, vomiting, diarrhea or  constipation.  He has no headache or visual changes.  The patient is here today for evaluation with repeat CT scan of the chest, abdomen and pelvis for restaging of his disease.   MEDICAL HISTORY: Past Medical History:  Diagnosis Date   A-fib [facility])    PMH: 2000-2001   Arthritis    Cancer ([place])    melanoma 2018   Diarrhea    due to medications   DVT (deep venous thrombosis) ([facility])    Left brachial vein DVT (01/30/19, following fracture)   Dysrhythmia    afib -    ETOH abuse    GERD (gastroesophageal reflux disease)    ocassional   Lung cancer ([place]) dx'd 02/26/2019   Metastatic cancer to bone ([facility]) dx'd 02/26/2019   Sleep apnea    do not wear CPAP   Stroke ([facility]) 2019   " mini"   Vertigo    Wears glasses     ALLERGIES:  has No Known Allergies.  MEDICATIONS:  Current Outpatient Medications  Medication Sig Dispense Refill   acetaminophen (TYLENOL) 650 MG CR tablet Take 650 mg by mouth every 8 (eight) hours as needed for pain.     apixaban (ELIQUIS) 5 MG TABS tablet Take 1 tablet (5 mg total) by mouth 2 (two) times daily. 60 tablet 2   buPROPion (WELLBUTRIN XL) 150 MG 24 hr tablet Take 150 mg by mouth daily as needed (nicotine cravings).     carvedilol (COREG) 6.25 MG tablet TAKE 1 TABLET BY MOUTH TWICE A DAY 180 tablet 3   loperamide (IMODIUM) 2 MG capsule Take 2 mg by mouth as needed for diarrhea or  loose stools.     Multiple Vitamin (MULTIVITAMIN PO) Take 1 tablet by mouth daily. Centrum     Omega-3 Fatty Acids (OMEGA-3 PO) Take 1 capsule by mouth daily.     osimertinib mesylate (TAGRISSO) 80 MG tablet Take 1 tablet (80 mg total) by mouth daily. 30 tablet 1   OVER THE COUNTER MEDICATION Take 1 tablet by mouth daily. Standard Process, LivaPlex (Liver supplement) (Patient not taking: Reported on 02/01/2022)     oxyCODONE-acetaminophen (PERCOCET) 10-325 MG tablet Take 1 tablet by mouth every 4 (four) hours as needed for pain. 30 tablet 0   rosuvastatin (CRESTOR) 5 MG tablet Take 5  mg by mouth daily.  2   TURMERIC PO Take 1 capsule by mouth daily.     vitamin B-12 (CYANOCOBALAMIN) 1000 MCG tablet Take 1,000 mcg by mouth daily.     No current facility-administered medications for this visit.    SURGICAL HISTORY:  Past Surgical History:  Procedure Laterality Date   ANKLE FRACTURE SURGERY Right 2003   COLONOSCOPY  2018   ELBOW SURGERY Left 1964   INGUINAL HERNIA REPAIR Bilateral 11/12/2020   Procedure: BILATERAL OPEN INGUINAL HERNIA REPAIR WITH MESH;  Surgeon: Coralie Keens, MD;  Location: [facility];  Service: General;  Laterality: Bilateral;   melanoma removal  04/2017   Lt shoulder   RETINAL DETACHMENT SURGERY Right 1971   TONSILLECTOMY     VIDEO BRONCHOSCOPY WITH ENDOBRONCHIAL NAVIGATION N/A 03/28/2019   Procedure: VIDEO BRONCHOSCOPY WITH ENDOBRONCHIAL NAVIGATION and Biopsies;  Surgeon: Collene Gobble, MD;  Location: [facility] OR;  Service: Thoracic;  Laterality: N/A;    REVIEW OF SYSTEMS:  Constitutional: positive for fatigue Eyes: negative Ears, nose, mouth, throat, and face: negative Respiratory: negative Cardiovascular: negative Gastrointestinal: negative Genitourinary:negative Integument/breast: negative Hematologic/lymphatic: negative Musculoskeletal:positive for bone pain Neurological: negative Behavioral/Psych: negative Endocrine: negative Allergic/Immunologic: negative   PHYSICAL EXAMINATION: General appearance: alert, cooperative, fatigued, and no distress Head: Normocephalic, without obvious abnormality, atraumatic Neck: no adenopathy, no JVD, supple, symmetrical, trachea midline, and thyroid not enlarged, symmetric, no tenderness/mass/nodules Lymph nodes: Cervical, supraclavicular, and axillary nodes normal. Resp: clear to auscultation bilaterally Back: symmetric, no curvature. ROM normal. No CVA tenderness. Cardio: regular rate and rhythm, S1, S2 normal, no murmur, click, rub or gallop GI: soft, non-tender; bowel sounds normal; no masses,  no  organomegaly Extremities: extremities normal, atraumatic, no cyanosis or edema Neurologic: Alert and oriented X 3, normal strength and tone. Normal symmetric reflexes. Normal coordination and gait   ECOG PERFORMANCE STATUS: 1 - Symptomatic but completely ambulatory  Blood pressure 126/67, pulse (!) 54, temperature 98.4 F (36.9 C), temperature source Oral, resp. rate 15, weight 163 lb 6.4 oz (74.1 kg), SpO2 100 %.  LABORATORY DATA: Lab Results  Component Value Date   WBC 6.0 02/26/2022   HGB 13.0 02/26/2022   HCT 37.5 (L) 02/26/2022   MCV 94.5 02/26/2022   PLT 196 02/26/2022      Chemistry      Component Value Date/Time   NA 138 02/26/2022 1429   K 4.1 02/26/2022 1429   CL 105 02/26/2022 1429   CO2 28 02/26/2022 1429   BUN 22 02/26/2022 1429   CREATININE 1.11 02/26/2022 1429      Component Value Date/Time   CALCIUM 9.8 02/26/2022 1429   ALKPHOS 117 02/26/2022 1429   AST 18 02/26/2022 1429   ALT 21 02/26/2022 1429   BILITOT 0.7 02/26/2022 1429       RADIOGRAPHIC STUDIES: CT Chest W Contrast  Result Date: 03/01/2022 CLINICAL DATA:  Metastatic non-small cell lung cancer restaging * Tracking Code: BO * EXAM: CT CHEST, ABDOMEN, AND PELVIS WITH CONTRAST TECHNIQUE: Multidetector CT imaging of the chest, abdomen and pelvis was performed following the standard protocol during bolus administration of intravenous contrast. RADIATION DOSE REDUCTION: This exam was performed according to the departmental dose-optimization program which includes automated exposure control, adjustment of the mA and/or kV according to patient size and/or use of iterative reconstruction technique. CONTRAST:  171m OMNIPAQUE IOHEXOL 300 MG/ML SOLN additional oral enteric contrast COMPARISON:  CT chest abdomen pelvis, 10/26/2021, MR right hip, 01/26/2022 FINDINGS: CT CHEST FINDINGS Cardiovascular: Aortic atherosclerosis. Normal heart size. Three-vessel coronary artery calcifications. No pericardial effusion.  Mediastinum/Nodes: No enlarged mediastinal, hilar, or axillary lymph nodes. Thyroid gland, trachea, and esophagus demonstrate no significant findings. Lungs/Pleura: Unchanged post treatment appearance of a spiculated mass of the superior segment left lower lobe measuring 3.4 x 2.4 cm (series 4, image 58). Unchanged 0.3 cm nodule of the azygoesophageal recess of the medial right lower lobe (series 4, image 106). Unchanged peribronchovascular nodule of the medial segment right middle lobe measuring 1.6 x 0.6 cm (series 4, image 74). Minimal paraseptal emphysema. No pleural effusion or pneumothorax. Musculoskeletal: No chest wall abnormality. No acute osseous findings. CT ABDOMEN PELVIS FINDINGS Hepatobiliary: No solid liver abnormality is seen. No gallstones, gallbladder wall thickening, or biliary dilatation. Pancreas: Unremarkable. No pancreatic ductal dilatation or surrounding inflammatory changes. Spleen: Normal in size without significant abnormality. Adrenals/Urinary Tract: Adrenal glands are unremarkable. Kidneys are normal, without renal calculi, solid lesion, or hydronephrosis. Thickening of the urinary bladder wall, likely related to chronic outlet obstruction. Stomach/Bowel: Stomach is within normal limits. Appendix appears normal. No evidence of bowel wall thickening, distention, or inflammatory changes. Vascular/Lymphatic: Aortic atherosclerosis. No enlarged abdominal or pelvic lymph nodes. Reproductive: No mass or other abnormality. Other: No abdominal wall hernia or abnormality. No ascites. Musculoskeletal: There is a grossly lytic osseous metastatic lesion of the right acetabulum, measuring approximately 6.6 x 2.2 cm, as seen by prior pelvic MR (series 2, image 106). New sclerosis of the adjacent right superior pubic ramus (series 2, image 108). Mixed lytic and sclerotic osseous metastatic disease throughout the axial skeleton is otherwise not significantly changed. IMPRESSION: 1. Unchanged post  treatment appearance of a spiculated mass of the superior segment left lower lobe. 2. Unchanged right middle and right lower lobe pulmonary nodules. 3. Grossly lytic osseous metastatic lesion of the right acetabulum, measuring approximately 6.6 x 2.2 cm, new in comparison to prior CT dated 10/26/2021 although as seen by prior pelvic MR dated 01/26/2022. New sclerosis of the adjacent right superior pubic ramus. Mixed lytic and sclerotic osseous metastatic disease throughout the axial skeleton is otherwise not significantly changed. 4. No evidence of organ metastatic disease in the chest, abdomen, or pelvis. 5. Emphysema. 6. Coronary artery disease. Aortic Atherosclerosis (ICD10-I70.0) and Emphysema (ICD10-J43.9). Electronically Signed   By: ADelanna AhmadiM.D.   On: 03/01/2022 09:11   CT Abdomen Pelvis W Contrast  Result Date: 03/01/2022 CLINICAL DATA:  Metastatic non-small cell lung cancer restaging * Tracking Code: BO * EXAM: CT CHEST, ABDOMEN, AND PELVIS WITH CONTRAST TECHNIQUE: Multidetector CT imaging of the chest, abdomen and pelvis was performed following the standard protocol during bolus administration of intravenous contrast. RADIATION DOSE REDUCTION: This exam was performed according to the departmental dose-optimization program which includes automated exposure control, adjustment of the mA and/or kV according to patient size and/or use of iterative reconstruction technique. CONTRAST:  153m OMNIPAQUE IOHEXOL 300 MG/ML SOLN additional oral enteric contrast COMPARISON:  CT chest abdomen pelvis, 10/26/2021, MR right hip, 01/26/2022 FINDINGS: CT CHEST FINDINGS Cardiovascular: Aortic atherosclerosis. Normal heart size. Three-vessel coronary artery calcifications. No pericardial effusion. Mediastinum/Nodes: No enlarged mediastinal, hilar, or axillary lymph nodes. Thyroid gland, trachea, and esophagus demonstrate no significant findings. Lungs/Pleura: Unchanged post treatment appearance of a spiculated mass  of the superior segment left lower lobe measuring 3.4 x 2.4 cm (series 4, image 58). Unchanged 0.3 cm nodule of the azygoesophageal recess of the medial right lower lobe (series 4, image 106). Unchanged peribronchovascular nodule of the medial segment right middle lobe measuring 1.6 x 0.6 cm (series 4, image 74). Minimal paraseptal emphysema. No pleural effusion or pneumothorax. Musculoskeletal: No chest wall abnormality. No acute osseous findings. CT ABDOMEN PELVIS FINDINGS Hepatobiliary: No solid liver abnormality is seen. No gallstones, gallbladder wall thickening, or biliary dilatation. Pancreas: Unremarkable. No pancreatic ductal dilatation or surrounding inflammatory changes. Spleen: Normal in size without significant abnormality. Adrenals/Urinary Tract: Adrenal glands are unremarkable. Kidneys are normal, without renal calculi, solid lesion, or hydronephrosis. Thickening of the urinary bladder wall, likely related to chronic outlet obstruction. Stomach/Bowel: Stomach is within normal limits. Appendix appears normal. No evidence of bowel wall thickening, distention, or inflammatory changes. Vascular/Lymphatic: Aortic atherosclerosis. No enlarged abdominal or pelvic lymph nodes. Reproductive: No mass or other abnormality. Other: No abdominal wall hernia or abnormality. No ascites. Musculoskeletal: There is a grossly lytic osseous metastatic lesion of the right acetabulum, measuring approximately 6.6 x 2.2 cm, as seen by prior pelvic MR (series 2, image 106). New sclerosis of the adjacent right superior pubic ramus (series 2, image 108). Mixed lytic and sclerotic osseous metastatic disease throughout the axial skeleton is otherwise not significantly changed. IMPRESSION: 1. Unchanged post treatment appearance of a spiculated mass of the superior segment left lower lobe. 2. Unchanged right middle and right lower lobe pulmonary nodules. 3. Grossly lytic osseous metastatic lesion of the right acetabulum, measuring  approximately 6.6 x 2.2 cm, new in comparison to prior CT dated 10/26/2021 although as seen by prior pelvic MR dated 01/26/2022. New sclerosis of the adjacent right superior pubic ramus. Mixed lytic and sclerotic osseous metastatic disease throughout the axial skeleton is otherwise not significantly changed. 4. No evidence of organ metastatic disease in the chest, abdomen, or pelvis. 5. Emphysema. 6. Coronary artery disease. Aortic Atherosclerosis (ICD10-I70.0) and Emphysema (ICD10-J43.9). Electronically Signed   By: ADelanna AhmadiM.D.   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  He is status post palliative radiotherapy to the metastatic disease in the left arm. The patient is currently undergoing treatment with target therapy with Tagrisso 80 mg p.o. daily started 34 months ago. He has been tolerating this treatment well with no concerning adverse effects. The patient had repeat CT scan of the chest, abdomen and pelvis performed recently.  I personally and independently reviewed the scan and discussed the result with the patient and his wife. His scan showed no evidence for disease progression in the chest, or abdomen but there was new grossly lytic osseous metastatic lesion of the  right acetabulum that is new compared to the previous imaging studies with new sclerosis of the adjacent right superior pubic ramus.  The patient underwent palliative radiotherapy to this area under the care of Dr. Sondra Come and he is scheduled to see orthopedic surgery at [facility] for surgical  intervention. I recommended for the patient to continue his current treatment with Tagrisso with the same dose. For the bone disease, I will start the patient on Xgeva 120 mg every 4 weeks after I receive dental clearance from his dentist. I will see the patient back for follow-up visit in 2 months for evaluation and repeat blood work. For the pain management, I will continue his current treatment with Percocet and I referred the patient to the palliative care clinic for further evaluation and management. He was advised to call immediately if he has any other concerning symptoms in the interval. The patient voices understanding of current disease status and treatment options and is in agreement with the current care plan. All questions were answered. The patient knows to call the clinic with any problems, questions or concerns. We can certainly see the patient much sooner if necessary.  Disclaimer: This note was dictated with voice recognition software. Similar sounding words can inadvertently be transcribed and may not be corrected upon review.

## 2022-03-02 ENCOUNTER — Encounter: Payer: Self-pay | Admitting: Internal Medicine

## 2022-03-02 ENCOUNTER — Other Ambulatory Visit: Payer: Self-pay

## 2022-03-02 NOTE — Telephone Encounter (Signed)
Left a message advising the refill has been sent to [facility].  Also that it is too early to get another MRI and to give the radiation a month to help improve his pain.  Advised to call back with any questions.

## 2022-03-04 ENCOUNTER — Telehealth: Payer: Self-pay

## 2022-03-04 NOTE — Telephone Encounter (Signed)
Called pt to confirm palliative care appointment on 03/11/22 @ 1:30 PM.

## 2022-03-10 ENCOUNTER — Telehealth: Payer: Self-pay | Admitting: Medical Oncology

## 2022-03-10 NOTE — Telephone Encounter (Signed)
Pt sees dentist tomorrow for dental exam and clearance for zometa.

## 2022-03-11 ENCOUNTER — Inpatient Hospital Stay: Payer: Medicare Other | Attending: Internal Medicine | Admitting: Nurse Practitioner

## 2022-03-11 ENCOUNTER — Telehealth: Payer: Self-pay | Admitting: Internal Medicine

## 2022-03-11 ENCOUNTER — Encounter: Payer: Self-pay | Admitting: Nurse Practitioner

## 2022-03-11 ENCOUNTER — Other Ambulatory Visit: Payer: Self-pay | Admitting: Nurse Practitioner

## 2022-03-11 VITALS — BP 147/64 | HR 47 | Temp 97.9°F | Resp 15 | Wt 164.9 lb

## 2022-03-11 DIAGNOSIS — C3490 Malignant neoplasm of unspecified part of unspecified bronchus or lung: Secondary | ICD-10-CM

## 2022-03-11 DIAGNOSIS — R634 Abnormal weight loss: Secondary | ICD-10-CM

## 2022-03-11 DIAGNOSIS — R630 Anorexia: Secondary | ICD-10-CM | POA: Diagnosis not present

## 2022-03-11 DIAGNOSIS — C3432 Malignant neoplasm of lower lobe, left bronchus or lung: Secondary | ICD-10-CM | POA: Diagnosis not present

## 2022-03-11 DIAGNOSIS — G893 Neoplasm related pain (acute) (chronic): Secondary | ICD-10-CM | POA: Diagnosis not present

## 2022-03-11 DIAGNOSIS — F17210 Nicotine dependence, cigarettes, uncomplicated: Secondary | ICD-10-CM | POA: Diagnosis not present

## 2022-03-11 DIAGNOSIS — Z7901 Long term (current) use of anticoagulants: Secondary | ICD-10-CM | POA: Insufficient documentation

## 2022-03-11 DIAGNOSIS — C7951 Secondary malignant neoplasm of bone: Secondary | ICD-10-CM | POA: Insufficient documentation

## 2022-03-11 DIAGNOSIS — Z79899 Other long term (current) drug therapy: Secondary | ICD-10-CM | POA: Insufficient documentation

## 2022-03-11 DIAGNOSIS — Z515 Encounter for palliative care: Secondary | ICD-10-CM

## 2022-03-11 DIAGNOSIS — R530 Neoplastic (malignant) related fatigue: Secondary | ICD-10-CM

## 2022-03-11 DIAGNOSIS — M62838 Other muscle spasm: Secondary | ICD-10-CM | POA: Diagnosis not present

## 2022-03-11 MED ORDER — TIZANIDINE HCL 2 MG PO CAPS: 2.0000 mg | ORAL_CAPSULE | Freq: Two times a day (BID) | ORAL | 1 refills | Status: DC | PRN

## 2022-03-11 MED ORDER — OXYCODONE-ACETAMINOPHEN 10-325 MG PO TABS: 1.0000 | ORAL_TABLET | Freq: Three times a day (TID) | ORAL | 0 refills | Status: DC | PRN

## 2022-03-11 NOTE — Progress Notes (Signed)
[place]  [phone] [phone]   Name: Albert Perez Date: 03/11/2022 MRN: [id number]  DOB: February 01, 1951  Patient Care Team: Aura Dials, MD as PCP - General (Family Medicine) Constance Haw, MD as PCP - Electrophysiology (Cardiology) Lorretta Harp, MD as PCP - Cardiology (Cardiology) Gentry Fitz, MD as Consulting Physician (Orthopedic Surgery) Pickenpack-Cousar, [name], NP as Nurse Practitioner (Nurse Practitioner)    REASON FOR CONSULTATION: Albert Perez is a 71 y.o. 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  Palliative ask to see for.  Palliative ask to see for symptom management.   SOCIAL HISTORY:     reports that he has been smoking cigarettes. He has a 7.50 pack-year smoking history. He has never used smokeless tobacco. He reports current alcohol use of about 2.0 standard drinks of alcohol per week. He reports current drug use. Drug: Marijuana.  ADVANCE DIRECTIVES:    CODE STATUS:   PAST MEDICAL HISTORY: Past Medical History:  Diagnosis Date   A-fib [facility])    PMH: 2000-2001   Arthritis    Cancer ([place])    melanoma 2018   Diarrhea    due to medications   DVT (deep venous thrombosis) (HCC)    Left brachial vein DVT (01/30/19, following fracture)   Dysrhythmia    afib -    ETOH abuse    GERD (gastroesophageal reflux disease)    ocassional   Lung cancer ([place]) dx'd 02/26/2019   Metastatic cancer to bone ([place]) dx'd 02/26/2019   Sleep apnea    do not wear CPAP   Stroke ([place]) 2019   " mini"   Vertigo    Wears glasses     PAST SURGICAL HISTORY:  Past Surgical History:  Procedure Laterality Date   ANKLE FRACTURE SURGERY Right 2003   COLONOSCOPY  2018   ELBOW SURGERY Left 1964   INGUINAL HERNIA REPAIR  Bilateral 11/12/2020   Procedure: BILATERAL OPEN INGUINAL HERNIA REPAIR WITH MESH;  Surgeon: Coralie Keens, MD;  Location: [place];  Service: General;  Laterality: Bilateral;   melanoma removal  04/2017   Lt shoulder   RETINAL DETACHMENT SURGERY Right 1971   TONSILLECTOMY     VIDEO BRONCHOSCOPY WITH ENDOBRONCHIAL NAVIGATION N/A 03/28/2019   Procedure: VIDEO BRONCHOSCOPY WITH ENDOBRONCHIAL NAVIGATION and Biopsies;  Surgeon: Collene Gobble, MD;  Location: [place];  Service: Thoracic;  Laterality: N/A;    HEMATOLOGY/ONCOLOGY HISTORY:  Oncology History  Adenocarcinoma of left lung, stage 4 ([place])  04/06/2019 Initial Diagnosis   Adenocarcinoma of left lung, stage 4 ([place])   04/24/2019 -  Chemotherapy   The patient had [No matching medication found in this treatment plan]  for chemotherapy treatment.    10/08/2020 Cancer Staging   Staging form: Lung, AJCC 8th Edition - Clinical: Stage IVB (cT3, cN0, cM1c) - Signed by Curt Bears, MD on 10/08/2020     ALLERGIES:  has No Known Allergies.  MEDICATIONS:  Current Outpatient Medications  Medication Sig Dispense Refill   acetaminophen (TYLENOL) 650 MG CR tablet Take 650 mg by mouth every 8 (eight) hours as needed for pain.     apixaban (ELIQUIS) 5 MG TABS tablet Take 1 tablet (5 mg total) by mouth 2 (two) times daily. 60 tablet 2   buPROPion (WELLBUTRIN XL) 150 MG 24 hr tablet Take 150 mg by  mouth daily as needed (nicotine cravings).     carvedilol (COREG) 6.25 MG tablet TAKE 1 TABLET BY MOUTH TWICE A DAY 180 tablet 3   loperamide (IMODIUM) 2 MG capsule Take 2 mg by mouth as needed for diarrhea or loose stools.     Multiple Vitamin (MULTIVITAMIN PO) Take 1 tablet by mouth daily. Centrum     Omega-3 Fatty Acids (OMEGA-3 PO) Take 1 capsule by mouth daily.     osimertinib mesylate (TAGRISSO) 80 MG tablet Take 1 tablet (80 mg total) by mouth daily. 30 tablet 1   OVER THE COUNTER MEDICATION Take 1 tablet by mouth daily. Standard Process, LivaPlex  (Liver supplement) (Patient not taking: Reported on 02/01/2022)     oxyCODONE-acetaminophen (PERCOCET) 10-325 MG tablet Take 1 tablet by mouth every 8 (eight) hours as needed for pain. 60 tablet 0   rosuvastatin (CRESTOR) 5 MG tablet Take 5 mg by mouth daily.  2   tiZANidine (ZANAFLEX) 2 MG tablet Take 1 tablet (2 mg total) by mouth every 8 (eight) hours as needed for muscle spasms. 30 tablet 1   TURMERIC PO Take 1 capsule by mouth daily.     vitamin B-12 (CYANOCOBALAMIN) 1000 MCG tablet Take 1,000 mcg by mouth daily.     No current facility-administered medications for this visit.    VITAL SIGNS: BP (!) 147/64 (BP Location: Left Arm, Patient Position: Sitting) Comment: Nurse notified  Pulse (!) 47 Comment: Nurse notified  Temp 97.9 F (36.6 C) (Oral)   Resp 15   Wt 164 lb 14.4 oz (74.8 kg)   SpO2 100%   BMI 25.83 kg/m  Filed Weights   03/11/22 1321  Weight: 164 lb 14.4 oz (74.8 kg)    Estimated body mass index is 25.83 kg/m as calculated from the following:   Height as of 02/01/22: 5\' 7"  (1.702 m).   Weight as of this encounter: 164 lb 14.4 oz (74.8 kg).  LABS: CBC:    Component Value Date/Time   WBC 6.0 02/26/2022 1429   WBC 5.9 11/12/2020 0804   HGB 13.0 02/26/2022 1429   HCT 37.5 (L) 02/26/2022 1429   PLT 196 02/26/2022 1429   MCV 94.5 02/26/2022 1429   NEUTROABS 4.0 02/26/2022 1429   LYMPHSABS 1.1 02/26/2022 1429   MONOABS 0.7 02/26/2022 1429   EOSABS 0.1 02/26/2022 1429   BASOSABS 0.0 02/26/2022 1429   Comprehensive Metabolic Panel:    Component Value Date/Time   NA 138 02/26/2022 1429   K 4.1 02/26/2022 1429   CL 105 02/26/2022 1429   CO2 28 02/26/2022 1429   BUN 22 02/26/2022 1429   CREATININE 1.11 02/26/2022 1429   GLUCOSE 109 (H) 02/26/2022 1429   CALCIUM 9.8 02/26/2022 1429   AST 18 02/26/2022 1429   ALT 21 02/26/2022 1429   ALKPHOS 117 02/26/2022 1429   BILITOT 0.7 02/26/2022 1429   PROT 6.9 02/26/2022 1429   ALBUMIN 4.2 02/26/2022 1429     RADIOGRAPHIC STUDIES: CT Chest W Contrast  Result Date: 03/01/2022 CLINICAL DATA:  Metastatic non-small cell lung cancer restaging * Tracking Code: BO * EXAM: CT CHEST, ABDOMEN, AND PELVIS WITH CONTRAST TECHNIQUE: Multidetector CT imaging of the chest, abdomen and pelvis was performed following the standard protocol during bolus administration of intravenous contrast. RADIATION DOSE REDUCTION: This exam was performed according to the departmental dose-optimization program which includes automated exposure control, adjustment of the mA and/or kV according to patient size and/or use of iterative reconstruction technique. CONTRAST:  190mL OMNIPAQUE  IOHEXOL 300 MG/ML SOLN additional oral enteric contrast COMPARISON:  CT chest abdomen pelvis, 10/26/2021, MR right hip, 01/26/2022 FINDINGS: CT CHEST FINDINGS Cardiovascular: Aortic atherosclerosis. Normal heart size. Three-vessel coronary artery calcifications. No pericardial effusion. Mediastinum/Nodes: No enlarged mediastinal, hilar, or axillary lymph nodes. Thyroid gland, trachea, and esophagus demonstrate no significant findings. Lungs/Pleura: Unchanged post treatment appearance of a spiculated mass of the superior segment left lower lobe measuring 3.4 x 2.4 cm (series 4, image 58). Unchanged 0.3 cm nodule of the azygoesophageal recess of the medial right lower lobe (series 4, image 106). Unchanged peribronchovascular nodule of the medial segment right middle lobe measuring 1.6 x 0.6 cm (series 4, image 74). Minimal paraseptal emphysema. No pleural effusion or pneumothorax. Musculoskeletal: No chest wall abnormality. No acute osseous findings. CT ABDOMEN PELVIS FINDINGS Hepatobiliary: No solid liver abnormality is seen. No gallstones, gallbladder wall thickening, or biliary dilatation. Pancreas: Unremarkable. No pancreatic ductal dilatation or surrounding inflammatory changes. Spleen: Normal in size without significant abnormality. Adrenals/Urinary Tract:  Adrenal glands are unremarkable. Kidneys are normal, without renal calculi, solid lesion, or hydronephrosis. Thickening of the urinary bladder wall, likely related to chronic outlet obstruction. Stomach/Bowel: Stomach is within normal limits. Appendix appears normal. No evidence of bowel wall thickening, distention, or inflammatory changes. Vascular/Lymphatic: Aortic atherosclerosis. No enlarged abdominal or pelvic lymph nodes. Reproductive: No mass or other abnormality. Other: No abdominal wall hernia or abnormality. No ascites. Musculoskeletal: There is a grossly lytic osseous metastatic lesion of the right acetabulum, measuring approximately 6.6 x 2.2 cm, as seen by prior pelvic MR (series 2, image 106). New sclerosis of the adjacent right superior pubic ramus (series 2, image 108). Mixed lytic and sclerotic osseous metastatic disease throughout the axial skeleton is otherwise not significantly changed. IMPRESSION: 1. Unchanged post treatment appearance of a spiculated mass of the superior segment left lower lobe. 2. Unchanged right middle and right lower lobe pulmonary nodules. 3. Grossly lytic osseous metastatic lesion of the right acetabulum, measuring approximately 6.6 x 2.2 cm, new in comparison to prior CT dated 10/26/2021 although as seen by prior pelvic MR dated 01/26/2022. New sclerosis of the adjacent right superior pubic ramus. Mixed lytic and sclerotic osseous metastatic disease throughout the axial skeleton is otherwise not significantly changed. 4. No evidence of organ metastatic disease in the chest, abdomen, or pelvis. 5. Emphysema. 6. Coronary artery disease. Aortic Atherosclerosis (ICD10-I70.0) and Emphysema (ICD10-J43.9). Electronically Signed   By: Delanna Ahmadi M.D.   On: 03/01/2022 09:11   CT Abdomen Pelvis W Contrast  Result Date: 03/01/2022 CLINICAL DATA:  Metastatic non-small cell lung cancer restaging * Tracking Code: BO * EXAM: CT CHEST, ABDOMEN, AND PELVIS WITH CONTRAST TECHNIQUE:  Multidetector CT imaging of the chest, abdomen and pelvis was performed following the standard protocol during bolus administration of intravenous contrast. RADIATION DOSE REDUCTION: This exam was performed according to the departmental dose-optimization program which includes automated exposure control, adjustment of the mA and/or kV according to patient size and/or use of iterative reconstruction technique. CONTRAST:  158mL OMNIPAQUE IOHEXOL 300 MG/ML SOLN additional oral enteric contrast COMPARISON:  CT chest abdomen pelvis, 10/26/2021, MR right hip, 01/26/2022 FINDINGS: CT CHEST FINDINGS Cardiovascular: Aortic atherosclerosis. Normal heart size. Three-vessel coronary artery calcifications. No pericardial effusion. Mediastinum/Nodes: No enlarged mediastinal, hilar, or axillary lymph nodes. Thyroid gland, trachea, and esophagus demonstrate no significant findings. Lungs/Pleura: Unchanged post treatment appearance of a spiculated mass of the superior segment left lower lobe measuring 3.4 x 2.4 cm (series 4, image 58). Unchanged 0.3 cm  nodule of the azygoesophageal recess of the medial right lower lobe (series 4, image 106). Unchanged peribronchovascular nodule of the medial segment right middle lobe measuring 1.6 x 0.6 cm (series 4, image 74). Minimal paraseptal emphysema. No pleural effusion or pneumothorax. Musculoskeletal: No chest wall abnormality. No acute osseous findings. CT ABDOMEN PELVIS FINDINGS Hepatobiliary: No solid liver abnormality is seen. No gallstones, gallbladder wall thickening, or biliary dilatation. Pancreas: Unremarkable. No pancreatic ductal dilatation or surrounding inflammatory changes. Spleen: Normal in size without significant abnormality. Adrenals/Urinary Tract: Adrenal glands are unremarkable. Kidneys are normal, without renal calculi, solid lesion, or hydronephrosis. Thickening of the urinary bladder wall, likely related to chronic outlet obstruction. Stomach/Bowel: Stomach is within  normal limits. Appendix appears normal. No evidence of bowel wall thickening, distention, or inflammatory changes. Vascular/Lymphatic: Aortic atherosclerosis. No enlarged abdominal or pelvic lymph nodes. Reproductive: No mass or other abnormality. Other: No abdominal wall hernia or abnormality. No ascites. Musculoskeletal: There is a grossly lytic osseous metastatic lesion of the right acetabulum, measuring approximately 6.6 x 2.2 cm, as seen by prior pelvic MR (series 2, image 106). New sclerosis of the adjacent right superior pubic ramus (series 2, image 108). Mixed lytic and sclerotic osseous metastatic disease throughout the axial skeleton is otherwise not significantly changed. IMPRESSION: 1. Unchanged post treatment appearance of a spiculated mass of the superior segment left lower lobe. 2. Unchanged right middle and right lower lobe pulmonary nodules. 3. Grossly lytic osseous metastatic lesion of the right acetabulum, measuring approximately 6.6 x 2.2 cm, new in comparison to prior CT dated 10/26/2021 although as seen by prior pelvic MR dated 01/26/2022. New sclerosis of the adjacent right superior pubic ramus. Mixed lytic and sclerotic osseous metastatic disease throughout the axial skeleton is otherwise not significantly changed. 4. No evidence of organ metastatic disease in the chest, abdomen, or pelvis. 5. Emphysema. 6. Coronary artery disease. Aortic Atherosclerosis (ICD10-I70.0) and Emphysema (ICD10-J43.9). Electronically Signed   By: Delanna Ahmadi M.D.   On: 03/01/2022 09:11    PERFORMANCE STATUS (ECOG) : 1 - Symptomatic but completely ambulatory  Review of Systems  Constitutional:  Positive for appetite change and fatigue.  Musculoskeletal:  Positive for arthralgias and back pain.  Unless otherwise noted, a complete review of systems is negative.  Physical Exam General: NAD, ambulatory with crutch Cardiovascular: regular rate and rhythm Pulmonary: normal breathing patterns  Extremities:  no edema, no joint deformities Skin: no rashes Neurological:AAO x3, mood appropriate   IMPRESSION: This is my initial visit with Albert Perez. No acute distress noted. His wife is present with him. Albert Perez is alert and able to engage in appropriate discussion.   I introduced myself, [place] RN, and Palliative's role in collaboration with the oncology team. Concept of Palliative Care was introduced as specialized medical care for people and their families living with serious illness.  It focuses on providing relief from the symptoms and stress of a serious illness.  The goal is to improve quality of life for both the patient and the family. Values and goals of care important to patient and family were attempted to be elicited.   Albert Perez lives in the home with his wife of more than 50 years. 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 He is the [place] of the [employer].   He is independent of all ADLs. Requires some rest breaks due to pain and fatigue. Shares his appetite has decreased over the past several months.  Neoplasm related pain Albert Perez endorses ongoing pain. Reports bulk of his pain is in his legs/arm/ and back. Shares his pain is much better since undergoing radiation however seems worst later in the day and night. Complains of sharp "spasm" like pains. He is remaining as active as possible.   We discussed his current pain regimen. He is taking Percocet at bedtime. States he does not have to take during the day only on rare occassions. Pain generally is controlled with Tylenol except for when he has the spasm associated pain. He has to be careful when this occurs and be at a position that he is able to do some range of motion with his legs and be in sitting position to prevent gait disturbance or falls.   We discussed continued use of percocet as needed with addition of tizanidine for muscle spasms. Education provided on use of  muscle relaxer and not to take at the same time frame he is taking percocet. He and wife are aware of potential drowsiness and other risk. Education also provided on safe amount of tylenol use given percocet does have tylenol in it. Not of major concern given he is only taken at bedtime however he and wife are aware.   Decreased appetite/Weight Loss  [place] states his appetite has decreased over the past month. Some foods do not taste the same or sometimes he has minimal appetite. Reports having to force eat on some days. We discussed importance of nutrition and increased protein. Encouraged use of Ensure, boost, or premier protein shakes. Wife states they do have some boost in the home.   He is aware we will continue to closely monitor. His current weight is 164lbs down from 169lbs on 7/31.   I discussed the importance of continued conversation with family and their medical providers regarding overall plan of care and treatment options, ensuring decisions are within the context of the patients values and GOCs.  PLAN: Established therapeutic relationship. Education provided on palliative's role in collaboration with their Oncology/Radiation team. Continue percocet as needed for pain. Reports only taking at night or late afternoon.  Tizanidine twice daily as needed for muscle spasms.  Education provided on increased protein. Will continue to closely monitor weight and nutrition.  I will plan to see patient back in 2-4 weeks in collaboration to other oncology appointments.    Patient expressed understanding and was in agreement with this plan. He also understands that He can call the clinic at any time with any questions, concerns, or complaints.   Thank you for your referral and allowing Palliative to assist in [facility] care.   Number and complexity of problems addressed: HIGH - 1 or more chronic illnesses with SEVERE exacerbation, progression, or side effects of treatment - advanced  cancer, pain. Any controlled substances utilized were prescribed in the context of palliative care.  Time Total: 50 min   Visit consisted of counseling and education dealing with the complex and emotionally intense issues of symptom management and palliative care in the setting of serious and potentially life-threatening illness.Greater than 50%  of this time was spent counseling and coordinating care related to the above assessment and plan.  Signed by: Alda Lea, AGPCNP-BC Palliative Medicine Team/[facility]

## 2022-03-11 NOTE — Telephone Encounter (Signed)
Scheduled per 08/28 los, patient has been called and voicemail was left.  

## 2022-03-16 DIAGNOSIS — Z923 Personal history of irradiation: Secondary | ICD-10-CM | POA: Diagnosis not present

## 2022-03-16 DIAGNOSIS — C7951 Secondary malignant neoplasm of bone: Secondary | ICD-10-CM | POA: Diagnosis not present

## 2022-03-16 DIAGNOSIS — M1611 Unilateral primary osteoarthritis, right hip: Secondary | ICD-10-CM | POA: Diagnosis not present

## 2022-03-16 DIAGNOSIS — C3490 Malignant neoplasm of unspecified part of unspecified bronchus or lung: Secondary | ICD-10-CM | POA: Diagnosis not present

## 2022-03-17 ENCOUNTER — Encounter: Payer: Self-pay | Admitting: Radiation Oncology

## 2022-03-18 ENCOUNTER — Ambulatory Visit: Payer: Medicare Other

## 2022-03-21 NOTE — Progress Notes (Incomplete)
  Radiation Oncology         [phone] ________________________________  Patient Name: Albert Perez MRN: [id number] DOB: 27-Jul-1950 Referring Physician: Aura Dials (Profile Not Attached) Date of Service: 02/19/2022 [facility], [place]                                                        End Of Treatment Note  Diagnoses: C79.51-Secondary malignant neoplasm of bone  Cancer Staging: The primary encounter diagnosis was Lung cancer metastatic to bone [facility]). A diagnosis of Adenocarcinoma of left lung, stage 4 (HCC) was also pertinent to this visit.   New right hip mass   Stage IV (T3, N0, M1c) non-small cell lung cancer, adenocarcinoma: presented with cavitary left lower lobe lung mass in addition to other pulmonary nodules and scattered hypermetabolic osseous metastatic disease involving the spine, ribs and bony pelvis as well as left proximal humeral pathologic fracture.   Intent: Palliative  Radiation Treatment Dates: 02/08/2022 through 02/19/2022 Site Technique Total Dose (Gy) Dose per Fx (Gy) Completed Fx Beam Energies  Hip, Right: Pelvis_R 3D 30/30 3 10/10 10X, 15X   Narrative: The patient tolerated radiation therapy relatively well. During his final weekly treatment check on 02/16/22, the patient reported improvement of his right hip pain, and fatigue. He denied any other symptoms.  (The patient also reported that he had less hip pain while standing than sitting).   Plan: The patient will follow-up with radiation oncology in one month .  ________________________________________________ -----------------------------------  Blair Promise, PhD, MD  This document serves as a record of services personally performed by Gery Pray, MD. It was created on his behalf by Roney Mans, a trained medical scribe. The creation of this record is based on the scribe's personal observations and the provider's statements to them. This document has been checked and  approved by the attending provider.

## 2022-03-21 NOTE — Progress Notes (Signed)
Radiation Oncology         [phone]) [phone] ________________________________  Name: Albert Perez MRN: [id number]  Date: 03/22/2022  DOB: Sep 16, 1950  Follow-Up Visit Note  CC: Aura Dials, MD  Aura Dials, MD  No diagnosis found.  Diagnosis: The primary encounter diagnosis was Lung cancer metastatic to bone [facility]). A diagnosis of Adenocarcinoma of left lung, stage 4 (HCC) was also pertinent to this visit.   New right hip mass   Stage IV (T3, N0, M1c) non-small cell lung cancer, adenocarcinoma: presented with cavitary left lower lobe lung mass in addition to other pulmonary nodules and scattered hypermetabolic osseous metastatic disease involving the spine, ribs and bony pelvis as well as left proximal humeral pathologic fracture.   Interval Since Last Radiation: 1 month  Intent: Palliative  Radiation Treatment Dates: 02/08/2022 through 02/19/2022 Site Technique Total Dose (Gy) Dose per Fx (Gy) Completed Fx Beam Energies  Hip, Right: Pelvis_R 3D 30/30 3 10/10 10X, 15X   Radiation Treatment Dates: 04/11/2019 through 04/24/2019 Site Technique Total Dose (Gy) Dose per Fx (Gy) Completed Fx Beam Energies  Extremity: Ext_Lt_humerus Complex 30/30 3 10/10 10X      Narrative:  The patient returns today for routine follow-up. The patient tolerated radiation therapy relatively well. During his final weekly treatment check on 02/16/22, the patient reported improvement of his right hip pain, and fatigue. He denied any other symptoms.  (The patient also reported that he had less hip pain while standing than sitting).     Since completing radiation therapy, he had a CT of the abdomen and pelvis performed on 02/26/22 which showed: unchanged post treatment appearance of a spiculated mass of the superior segment left lower lobe, and unchanged right middle and right lower lobe pulmonary nodules. CT also redemonstrated the grossly lytic osseous metastatic lesion of the right acetabulum, measuring  approximately 6.6 x 2.2 cm, and sclerosis of the adjacent right superior pubic ramus; both seen on prior pelvic MRI dated 01/26/2022. Otherwise, CT showed no significant change of the mixed lytic and sclerotic osseous metastatic disease throughout the axial skeleton, and no evidence of organ metastatic disease in the chest, abdomen, or pelvis.   The patient followed up with Dr. Julien Nordmann on 03/01/22. During which time, the patient reported some persistent hip pain despite being on Percocet. The patient was otherwise noted to be tolerating Tagrisso well. In terms of further treatment, the patient consented to starting Xgeva 120 mg q4 weeks for his bony disease. Dr. Julien Nordmann also referred the patient to the palliative care clinic for further management of his pain.   The patient followed up with Dr. Redmond Pulling, [facility] ortho, on 03/16/22 who recommended proceeding with follow-up imaging in 2 months given overall improvement of his symptoms. (Pelvic x-ray taken during this visit redemonstrated the lytic lesion centered in the right acetabulum and mild degenerative changes of the right hip).               (Patient met with palliative care on 03/11/22 and was started on Tizanidine for muscle spasm related pain).   Allergies:  has No Known Allergies.  Meds: Current Outpatient Medications  Medication Sig Dispense Refill   acetaminophen (TYLENOL) 650 MG CR tablet Take 650 mg by mouth every 8 (eight) hours as needed for pain.     apixaban (ELIQUIS) 5 MG TABS tablet Take 1 tablet (5 mg total) by mouth 2 (two) times daily. 60 tablet 2   buPROPion (WELLBUTRIN XL) 150 MG 24 hr tablet Take  150 mg by mouth daily as needed (nicotine cravings).     carvedilol (COREG) 6.25 MG tablet TAKE 1 TABLET BY MOUTH TWICE A DAY 180 tablet 3   loperamide (IMODIUM) 2 MG capsule Take 2 mg by mouth as needed for diarrhea or loose stools.     Multiple Vitamin (MULTIVITAMIN PO) Take 1 tablet by mouth daily. Centrum     Omega-3 Fatty  Acids (OMEGA-3 PO) Take 1 capsule by mouth daily.     osimertinib mesylate (TAGRISSO) 80 MG tablet Take 1 tablet (80 mg total) by mouth daily. 30 tablet 1   OVER THE COUNTER MEDICATION Take 1 tablet by mouth daily. Standard Process, LivaPlex (Liver supplement) (Patient not taking: Reported on 02/01/2022)     oxyCODONE-acetaminophen (PERCOCET) 10-325 MG tablet Take 1 tablet by mouth every 8 (eight) hours as needed for pain. 60 tablet 0   rosuvastatin (CRESTOR) 5 MG tablet Take 5 mg by mouth daily.  2   tiZANidine (ZANAFLEX) 2 MG tablet Take 1 tablet (2 mg total) by mouth every 8 (eight) hours as needed for muscle spasms. 30 tablet 1   TURMERIC PO Take 1 capsule by mouth daily.     vitamin B-12 (CYANOCOBALAMIN) 1000 MCG tablet Take 1,000 mcg by mouth daily.     No current facility-administered medications for this encounter.    Physical Findings: The patient is in no acute distress. Patient is alert and oriented.  vitals were not taken for this visit. .  No significant changes. Lungs are clear to auscultation bilaterally. Heart has regular rate and rhythm. No palpable cervical, supraclavicular, or axillary adenopathy. Abdomen soft, non-tender, normal bowel sounds.   Lab Findings: Lab Results  Component Value Date   WBC 6.0 02/26/2022   HGB 13.0 02/26/2022   HCT 37.5 (L) 02/26/2022   MCV 94.5 02/26/2022   PLT 196 02/26/2022    Radiographic Findings: CT Chest W Contrast  Result Date: 03/01/2022 CLINICAL DATA:  Metastatic non-small cell lung cancer restaging * Tracking Code: BO * EXAM: CT CHEST, ABDOMEN, AND PELVIS WITH CONTRAST TECHNIQUE: Multidetector CT imaging of the chest, abdomen and pelvis was performed following the standard protocol during bolus administration of intravenous contrast. RADIATION DOSE REDUCTION: This exam was performed according to the departmental dose-optimization program which includes automated exposure control, adjustment of the mA and/or kV according to patient  size and/or use of iterative reconstruction technique. CONTRAST:  172m OMNIPAQUE IOHEXOL 300 MG/ML SOLN additional oral enteric contrast COMPARISON:  CT chest abdomen pelvis, 10/26/2021, MR right hip, 01/26/2022 FINDINGS: CT CHEST FINDINGS Cardiovascular: Aortic atherosclerosis. Normal heart size. Three-vessel coronary artery calcifications. No pericardial effusion. Mediastinum/Nodes: No enlarged mediastinal, hilar, or axillary lymph nodes. Thyroid gland, trachea, and esophagus demonstrate no significant findings. Lungs/Pleura: Unchanged post treatment appearance of a spiculated mass of the superior segment left lower lobe measuring 3.4 x 2.4 cm (series 4, image 58). Unchanged 0.3 cm nodule of the azygoesophageal recess of the medial right lower lobe (series 4, image 106). Unchanged peribronchovascular nodule of the medial segment right middle lobe measuring 1.6 x 0.6 cm (series 4, image 74). Minimal paraseptal emphysema. No pleural effusion or pneumothorax. Musculoskeletal: No chest wall abnormality. No acute osseous findings. CT ABDOMEN PELVIS FINDINGS Hepatobiliary: No solid liver abnormality is seen. No gallstones, gallbladder wall thickening, or biliary dilatation. Pancreas: Unremarkable. No pancreatic ductal dilatation or surrounding inflammatory changes. Spleen: Normal in size without significant abnormality. Adrenals/Urinary Tract: Adrenal glands are unremarkable. Kidneys are normal, without renal calculi, solid lesion, or hydronephrosis.  Thickening of the urinary bladder wall, likely related to chronic outlet obstruction. Stomach/Bowel: Stomach is within normal limits. Appendix appears normal. No evidence of bowel wall thickening, distention, or inflammatory changes. Vascular/Lymphatic: Aortic atherosclerosis. No enlarged abdominal or pelvic lymph nodes. Reproductive: No mass or other abnormality. Other: No abdominal wall hernia or abnormality. No ascites. Musculoskeletal: There is a grossly lytic osseous  metastatic lesion of the right acetabulum, measuring approximately 6.6 x 2.2 cm, as seen by prior pelvic MR (series 2, image 106). New sclerosis of the adjacent right superior pubic ramus (series 2, image 108). Mixed lytic and sclerotic osseous metastatic disease throughout the axial skeleton is otherwise not significantly changed. IMPRESSION: 1. Unchanged post treatment appearance of a spiculated mass of the superior segment left lower lobe. 2. Unchanged right middle and right lower lobe pulmonary nodules. 3. Grossly lytic osseous metastatic lesion of the right acetabulum, measuring approximately 6.6 x 2.2 cm, new in comparison to prior CT dated 10/26/2021 although as seen by prior pelvic MR dated 01/26/2022. New sclerosis of the adjacent right superior pubic ramus. Mixed lytic and sclerotic osseous metastatic disease throughout the axial skeleton is otherwise not significantly changed. 4. No evidence of organ metastatic disease in the chest, abdomen, or pelvis. 5. Emphysema. 6. Coronary artery disease. Aortic Atherosclerosis (ICD10-I70.0) and Emphysema (ICD10-J43.9). Electronically Signed   By: Delanna Ahmadi M.D.   On: 03/01/2022 09:11   CT Abdomen Pelvis W Contrast  Result Date: 03/01/2022 CLINICAL DATA:  Metastatic non-small cell lung cancer restaging * Tracking Code: BO * EXAM: CT CHEST, ABDOMEN, AND PELVIS WITH CONTRAST TECHNIQUE: Multidetector CT imaging of the chest, abdomen and pelvis was performed following the standard protocol during bolus administration of intravenous contrast. RADIATION DOSE REDUCTION: This exam was performed according to the departmental dose-optimization program which includes automated exposure control, adjustment of the mA and/or kV according to patient size and/or use of iterative reconstruction technique. CONTRAST:  139m OMNIPAQUE IOHEXOL 300 MG/ML SOLN additional oral enteric contrast COMPARISON:  CT chest abdomen pelvis, 10/26/2021, MR right hip, 01/26/2022 FINDINGS: CT  CHEST FINDINGS Cardiovascular: Aortic atherosclerosis. Normal heart size. Three-vessel coronary artery calcifications. No pericardial effusion. Mediastinum/Nodes: No enlarged mediastinal, hilar, or axillary lymph nodes. Thyroid gland, trachea, and esophagus demonstrate no significant findings. Lungs/Pleura: Unchanged post treatment appearance of a spiculated mass of the superior segment left lower lobe measuring 3.4 x 2.4 cm (series 4, image 58). Unchanged 0.3 cm nodule of the azygoesophageal recess of the medial right lower lobe (series 4, image 106). Unchanged peribronchovascular nodule of the medial segment right middle lobe measuring 1.6 x 0.6 cm (series 4, image 74). Minimal paraseptal emphysema. No pleural effusion or pneumothorax. Musculoskeletal: No chest wall abnormality. No acute osseous findings. CT ABDOMEN PELVIS FINDINGS Hepatobiliary: No solid liver abnormality is seen. No gallstones, gallbladder wall thickening, or biliary dilatation. Pancreas: Unremarkable. No pancreatic ductal dilatation or surrounding inflammatory changes. Spleen: Normal in size without significant abnormality. Adrenals/Urinary Tract: Adrenal glands are unremarkable. Kidneys are normal, without renal calculi, solid lesion, or hydronephrosis. Thickening of the urinary bladder wall, likely related to chronic outlet obstruction. Stomach/Bowel: Stomach is within normal limits. Appendix appears normal. No evidence of bowel wall thickening, distention, or inflammatory changes. Vascular/Lymphatic: Aortic atherosclerosis. No enlarged abdominal or pelvic lymph nodes. Reproductive: No mass or other abnormality. Other: No abdominal wall hernia or abnormality. No ascites. Musculoskeletal: There is a grossly lytic osseous metastatic lesion of the right acetabulum, measuring approximately 6.6 x 2.2 cm, as seen by prior pelvic MR (series  2, image 106). New sclerosis of the adjacent right superior pubic ramus (series 2, image 108). Mixed lytic and  sclerotic osseous metastatic disease throughout the axial skeleton is otherwise not significantly changed. IMPRESSION: 1. Unchanged post treatment appearance of a spiculated mass of the superior segment left lower lobe. 2. Unchanged right middle and right lower lobe pulmonary nodules. 3. Grossly lytic osseous metastatic lesion of the right acetabulum, measuring approximately 6.6 x 2.2 cm, new in comparison to prior CT dated 10/26/2021 although as seen by prior pelvic MR dated 01/26/2022. New sclerosis of the adjacent right superior pubic ramus. Mixed lytic and sclerotic osseous metastatic disease throughout the axial skeleton is otherwise not significantly changed. 4. No evidence of organ metastatic disease in the chest, abdomen, or pelvis. 5. Emphysema. 6. Coronary artery disease. Aortic Atherosclerosis (ICD10-I70.0) and Emphysema (ICD10-J43.9). Electronically Signed   By: Delanna Ahmadi M.D.   On: 03/01/2022 09:11    Impression: New right hip mass   Stage IV (T3, N0, M1c) non-small cell lung cancer, adenocarcinoma: presented with cavitary left lower lobe lung mass in addition to other pulmonary nodules and scattered hypermetabolic osseous metastatic disease involving the spine, ribs and bony pelvis as well as left proximal humeral pathologic fracture.   The patient is recovering from the effects of radiation.  ***  Plan:  ***   *** minutes of total time was spent for this patient encounter, including preparation, face-to-face counseling with the patient and coordination of care, physical exam, and documentation of the encounter. ____________________________________  Blair Promise, PhD, MD  This document serves as a record of services personally performed by Gery Pray, MD. It was created on his behalf by Roney Mans, a trained medical scribe. The creation of this record is based on the scribe's personal observations and the provider's statements to them. This document has been checked and  approved by the attending provider.

## 2022-03-22 ENCOUNTER — Inpatient Hospital Stay: Payer: Medicare Other | Admitting: Nurse Practitioner

## 2022-03-22 ENCOUNTER — Encounter: Payer: Self-pay | Admitting: Radiation Oncology

## 2022-03-22 ENCOUNTER — Ambulatory Visit
Admission: RE | Admit: 2022-03-22 | Discharge: 2022-03-22 | Disposition: A | Discharge status: Home / Self Care | Payer: Medicare Other | Source: Ambulatory Visit | Attending: Radiation Oncology | Admitting: Radiation Oncology

## 2022-03-22 DIAGNOSIS — C3490 Malignant neoplasm of unspecified part of unspecified bronchus or lung: Secondary | ICD-10-CM | POA: Diagnosis not present

## 2022-03-22 DIAGNOSIS — C7951 Secondary malignant neoplasm of bone: Secondary | ICD-10-CM | POA: Insufficient documentation

## 2022-03-22 HISTORY — DX: Personal history of irradiation: Z92.3

## 2022-03-22 NOTE — Progress Notes (Signed)
Albert Perez is here today for follow up post radiation to the pelvic.  They completed their radiation on: 02/19/22   Does the patient complain of any of the following:  Pain: Patient reports constant pain to right hip. Rating pain 2/10. Patient takes oxycodone as needed.  Diarrhea/Constipation: Intermittent diarrhea and constipation.  Nausea/Vomiting: No Urinary Issues (dysuria/incomplete emptying/ incontinence/ increased : Urinary urgency. Post radiation skin changes: No  Patient reports pain to left arm at times. Patient reports limited range of motion.    Additional comments if applicable:   BP 614/70 (BP Location: Left Arm, Patient Position: Sitting, Cuff Size: Normal)   Pulse (!) 56   Temp 97.6 F (36.4 C)   Resp 18   Ht 5\' 7"  (1.702 m)   Wt 165 lb (74.8 kg)   SpO2 100%   BMI 25.84 kg/m

## 2022-03-26 ENCOUNTER — Other Ambulatory Visit: Payer: Self-pay | Admitting: Internal Medicine

## 2022-03-26 DIAGNOSIS — I2692 Saddle embolus of pulmonary artery without acute cor pulmonale: Secondary | ICD-10-CM

## 2022-04-05 ENCOUNTER — Other Ambulatory Visit: Payer: Self-pay

## 2022-04-05 ENCOUNTER — Telehealth: Payer: Self-pay

## 2022-04-05 DIAGNOSIS — C3490 Malignant neoplasm of unspecified part of unspecified bronchus or lung: Secondary | ICD-10-CM

## 2022-04-05 DIAGNOSIS — G893 Neoplasm related pain (acute) (chronic): Secondary | ICD-10-CM

## 2022-04-05 DIAGNOSIS — Z515 Encounter for palliative care: Secondary | ICD-10-CM

## 2022-04-05 MED ORDER — OXYCODONE-ACETAMINOPHEN 10-325 MG PO TABS: 1.0000 | ORAL_TABLET | Freq: Three times a day (TID) | ORAL | 0 refills | Status: DC | PRN

## 2022-04-05 NOTE — Telephone Encounter (Signed)
Received a call from patient stating he continues to have pain to right hip that is making it difficult for him to ambulate. Patient states pain remains at 3/10 with medication. Patient is requesting a refill on Oxycodone. Informed patient I would reach out to Pallative care NP for refill. Patient voiced understanding. Patient also requesting a scan to make sure his cancer has not come back. Please advise.

## 2022-04-07 ENCOUNTER — Other Ambulatory Visit: Payer: Self-pay | Admitting: Medical Oncology

## 2022-04-07 ENCOUNTER — Other Ambulatory Visit: Payer: Self-pay

## 2022-04-07 ENCOUNTER — Encounter: Payer: Self-pay | Admitting: Internal Medicine

## 2022-04-07 DIAGNOSIS — C3492 Malignant neoplasm of unspecified part of left bronchus or lung: Secondary | ICD-10-CM

## 2022-04-07 MED ORDER — OSIMERTINIB MESYLATE 80 MG PO TABS: 80.0000 mg | ORAL_TABLET | Freq: Every day | ORAL | 1 refills | Status: DC

## 2022-04-13 ENCOUNTER — Telehealth: Payer: Self-pay | Admitting: Medical Oncology

## 2022-04-13 NOTE — Telephone Encounter (Signed)
Tagrisso refilled called to [facility].

## 2022-04-15 ENCOUNTER — Telehealth: Payer: Self-pay | Admitting: Medical Oncology

## 2022-04-15 ENCOUNTER — Encounter: Payer: Self-pay | Admitting: Medical Oncology

## 2022-04-15 NOTE — Telephone Encounter (Signed)
Per [place] and me recorded line, his last Tagrisso was shipped Apr 14 2022. Pt notified via [employer].Marland Kitchen

## 2022-04-21 ENCOUNTER — Telehealth: Payer: Self-pay | Admitting: Oncology

## 2022-04-21 NOTE — Telephone Encounter (Signed)
Blanch Media called back and said Albert Perez would like to schedule an appointment with Dr. Sondra Come.  Appointment scheduled for 04/22/22 at 11:30.  She verbalized understanding and agreement.

## 2022-04-21 NOTE — Telephone Encounter (Signed)
Patient's wife, Blanch Media, called and asked when Keng's next appointment with Dr. Sondra Come is scheduled. Called back and left a message advising that Dr. Clabe Seal last note says he can follow up as needed and that there are no appointments currently scheduled. Requested a return call if they would like to schedule an appointment.

## 2022-04-21 NOTE — Progress Notes (Signed)
Radiation Oncology         [phone]) [phone] ________________________________  Name: Albert Perez MRN: [id number]  Date: 04/22/2022  DOB: [birth year]  Follow-Up Visit Note  CC: Aura Dials, MD  Aura Dials, MD  No diagnosis found.  Diagnosis: The primary encounter diagnosis was Lung cancer metastatic to bone [facility]). A diagnosis of Adenocarcinoma of left lung, stage 4 (HCC) was also pertinent to this visit.   New right hip mass   Stage IV (T3, N0, M1c) non-small cell lung cancer, adenocarcinoma: presented with cavitary left lower lobe lung mass in addition to other pulmonary nodules and scattered hypermetabolic osseous metastatic disease involving the spine, ribs and bony pelvis as well as left proximal humeral pathologic fracture.   Interval Since Last Radiation: 2 months and 1 day  Intent: Palliative  Radiation Treatment Dates: 02/08/2022 through 02/19/2022 Site Technique Total Dose (Gy) Dose per Fx (Gy) Completed Fx Beam Energies  Hip, Right: Pelvis_R 3D 30/30 3 10/10 10X, 15X    Radiation Treatment Dates: 04/11/2019 through 04/24/2019 Site Technique Total Dose (Gy) Dose per Fx (Gy) Completed Fx Beam Energies  Extremity: Ext_Lt_humerus Complex 30/30 3 10/10 10X    Narrative:  The patient returns today for evaluation of ongoing right hip pain. He was last seen here for follow-up on 03/22/22. The patient called our office on 04/05/22 with complaints of ongoing right hip pain causing subsequent difficulties with ambulation. He detailed his pain as rated at a 3/10 with medication. He also requested a scan to make sure his disease was not correlated with his pain, as well as a refill on his oxycodone. Eula Listen LPN informed the patient that she would reach out to palliative care for a refill.     Otherwise, no significant interval history since the patient was last seen for follow-up.   ***                             Allergies:  has No Known Allergies.  Meds: Current  Outpatient Medications  Medication Sig Dispense Refill   acetaminophen (TYLENOL) 650 MG CR tablet Take 650 mg by mouth every 8 (eight) hours as needed for pain.     buPROPion (WELLBUTRIN XL) 150 MG 24 hr tablet Take 150 mg by mouth daily as needed (nicotine cravings).     carvedilol (COREG) 6.25 MG tablet TAKE 1 TABLET BY MOUTH TWICE A DAY 180 tablet 3   ELIQUIS 5 MG TABS tablet TAKE 1 TABLET BY MOUTH TWICE A DAY 60 tablet 2   loperamide (IMODIUM) 2 MG capsule Take 2 mg by mouth as needed for diarrhea or loose stools.     Multiple Vitamin (MULTIVITAMIN PO) Take 1 tablet by mouth daily. Centrum     Omega-3 Fatty Acids (OMEGA-3 PO) Take 1 capsule by mouth daily.     osimertinib mesylate (TAGRISSO) 80 MG tablet Take 1 tablet (80 mg total) by mouth daily. 30 tablet 1   OVER THE COUNTER MEDICATION Take 1 tablet by mouth daily. Standard Process, LivaPlex (Liver supplement)     oxyCODONE-acetaminophen (PERCOCET) 10-325 MG tablet Take 1 tablet by mouth every 8 (eight) hours as needed for pain. 60 tablet 0   rosuvastatin (CRESTOR) 5 MG tablet Take 5 mg by mouth daily.  2   tiZANidine (ZANAFLEX) 2 MG tablet Take 1 tablet (2 mg total) by mouth every 8 (eight) hours as needed for muscle spasms. 30 tablet 1   TURMERIC PO  Take 1 capsule by mouth daily.     vitamin B-12 (CYANOCOBALAMIN) 1000 MCG tablet Take 1,000 mcg by mouth daily.     No current facility-administered medications for this encounter.    Physical Findings: The patient is in no acute distress. Patient is alert and oriented.  vitals were not taken for this visit. .  No significant changes. Lungs are clear to auscultation bilaterally. Heart has regular rate and rhythm. No palpable cervical, supraclavicular, or axillary adenopathy. Abdomen soft, non-tender, normal bowel sounds.   Lab Findings: Lab Results  Component Value Date   WBC 6.0 02/26/2022   HGB 13.0 02/26/2022   HCT 37.5 (L) 02/26/2022   MCV 94.5 02/26/2022   PLT 196 02/26/2022     Radiographic Findings: No results found.  Impression:  The primary encounter diagnosis was Lung cancer metastatic to bone [facility]). A diagnosis of Adenocarcinoma of left lung, stage 4 (HCC) was also pertinent to this visit.   New right hip mass   Stage IV (T3, N0, M1c) non-small cell lung cancer, adenocarcinoma: presented with cavitary left lower lobe lung mass in addition to other pulmonary nodules and scattered hypermetabolic osseous metastatic disease involving the spine, ribs and bony pelvis as well as left proximal humeral pathologic fracture.   The patient is recovering from the effects of radiation.  ***  Plan:  ***   *** minutes of total time was spent for this patient encounter, including preparation, face-to-face counseling with the patient and coordination of care, physical exam, and documentation of the encounter. ____________________________________  Blair Promise, PhD, MD  This document serves as a record of services personally performed by Gery Pray, MD. It was created on his behalf by Roney Mans, a trained medical scribe. The creation of this record is based on the scribe's personal observations and the provider's statements to them. This document has been checked and approved by the attending provider.

## 2022-04-22 ENCOUNTER — Telehealth: Payer: Self-pay | Admitting: *Deleted

## 2022-04-22 ENCOUNTER — Ambulatory Visit
Admission: RE | Admit: 2022-04-22 | Discharge: 2022-04-22 | Disposition: A | Discharge status: Home / Self Care | Payer: Medicare Other | Source: Ambulatory Visit | Attending: Radiation Oncology | Admitting: Radiation Oncology

## 2022-04-22 ENCOUNTER — Other Ambulatory Visit: Payer: Self-pay

## 2022-04-22 VITALS — BP 144/57 | HR 55 | Temp 97.6°F | Resp 18 | Ht 67.0 in | Wt 164.5 lb

## 2022-04-22 DIAGNOSIS — C3492 Malignant neoplasm of unspecified part of left bronchus or lung: Secondary | ICD-10-CM

## 2022-04-22 DIAGNOSIS — C7951 Secondary malignant neoplasm of bone: Secondary | ICD-10-CM | POA: Insufficient documentation

## 2022-04-22 DIAGNOSIS — Z923 Personal history of irradiation: Secondary | ICD-10-CM | POA: Insufficient documentation

## 2022-04-22 DIAGNOSIS — Z79899 Other long term (current) drug therapy: Secondary | ICD-10-CM | POA: Diagnosis not present

## 2022-04-22 DIAGNOSIS — C3490 Malignant neoplasm of unspecified part of unspecified bronchus or lung: Secondary | ICD-10-CM

## 2022-04-22 DIAGNOSIS — M25551 Pain in right hip: Secondary | ICD-10-CM | POA: Insufficient documentation

## 2022-04-22 DIAGNOSIS — G893 Neoplasm related pain (acute) (chronic): Secondary | ICD-10-CM

## 2022-04-22 DIAGNOSIS — Z515 Encounter for palliative care: Secondary | ICD-10-CM

## 2022-04-22 DIAGNOSIS — Z7901 Long term (current) use of anticoagulants: Secondary | ICD-10-CM | POA: Diagnosis not present

## 2022-04-22 DIAGNOSIS — C3432 Malignant neoplasm of lower lobe, left bronchus or lung: Secondary | ICD-10-CM | POA: Insufficient documentation

## 2022-04-22 MED ORDER — TIZANIDINE HCL 2 MG PO TABS: 2.0000 mg | ORAL_TABLET | Freq: Three times a day (TID) | ORAL | 1 refills | Status: DC | PRN

## 2022-04-22 MED ORDER — OXYCODONE-ACETAMINOPHEN 10-325 MG PO TABS: 1.0000 | ORAL_TABLET | Freq: Three times a day (TID) | ORAL | 0 refills | Status: DC | PRN

## 2022-04-22 NOTE — Telephone Encounter (Signed)
CALLED PATIENT TO INFORM OF CT FOR 04-27-22- ARRIVAL TIME- 7:45 AM, PATIENT TO BE NPO- 4 HRS. PRIOR TO TEST, PATIENT TO DRINK ORAL CONTRAST IN RADIOLOGY, DAY OF SCAN, PATIENT TO RECEIVE RESULTS FROM DR. KINARD ON 04-28-22 @ 1:30 PM, LVM FOR A RETURN CALL

## 2022-04-22 NOTE — Progress Notes (Signed)
Albert Perez is here today for follow up post radiation to the pelvic.  They completed their radiation on: 02/19/22  Does the patient complain of any of the following:  Pain:Patient reports having constant pain, patient reports taking tizanidine  and oxycodone. Patient reports minimal relief.  Diarrhea/Constipation: No Nausea/Vomiting: no Urinary Issues (dysuria/incomplete emptying/ incontinence/ increased frequency/urgency): No Post radiation skin changes: No   Additional comments if applicable: Patient reports feeling like he has a  "golf ball" lump to right pelvis.   BP (!) 144/57 (BP Location: Left Arm, Patient Position: Sitting)   Pulse (!) 55   Temp 97.6 F (36.4 C) (Oral)   Resp 18   Ht 5\' 7"  (1.702 m)   Wt 164 lb 8 oz (74.6 kg)   SpO2 100%   BMI 25.76 kg/m

## 2022-04-23 ENCOUNTER — Telehealth: Payer: Self-pay

## 2022-04-23 ENCOUNTER — Other Ambulatory Visit: Payer: Self-pay | Admitting: Medical Oncology

## 2022-04-23 NOTE — Telephone Encounter (Signed)
Pt wife called asking for refill, see new orders. Pt wife also aware of upcoming schedule. No further questions.

## 2022-04-26 ENCOUNTER — Inpatient Hospital Stay: Payer: Medicare Other | Attending: Internal Medicine

## 2022-04-26 DIAGNOSIS — C7951 Secondary malignant neoplasm of bone: Secondary | ICD-10-CM | POA: Diagnosis not present

## 2022-04-26 DIAGNOSIS — Z7901 Long term (current) use of anticoagulants: Secondary | ICD-10-CM | POA: Diagnosis not present

## 2022-04-26 DIAGNOSIS — C3432 Malignant neoplasm of lower lobe, left bronchus or lung: Secondary | ICD-10-CM | POA: Diagnosis not present

## 2022-04-26 DIAGNOSIS — C3490 Malignant neoplasm of unspecified part of unspecified bronchus or lung: Secondary | ICD-10-CM

## 2022-04-26 DIAGNOSIS — I4891 Unspecified atrial fibrillation: Secondary | ICD-10-CM | POA: Insufficient documentation

## 2022-04-26 DIAGNOSIS — Z923 Personal history of irradiation: Secondary | ICD-10-CM | POA: Diagnosis not present

## 2022-04-26 DIAGNOSIS — Z79899 Other long term (current) drug therapy: Secondary | ICD-10-CM | POA: Diagnosis not present

## 2022-04-26 DIAGNOSIS — Z86718 Personal history of other venous thrombosis and embolism: Secondary | ICD-10-CM | POA: Insufficient documentation

## 2022-04-26 LAB — CBC WITH DIFFERENTIAL (CANCER CENTER ONLY)
Abs Immature Granulocytes: 0.03 10*3/uL (ref 0.00–0.07)
Basophils Absolute: 0.1 10*3/uL (ref 0.0–0.1)
Basophils Relative: 1 %
Eosinophils Absolute: 0.4 10*3/uL (ref 0.0–0.5)
Eosinophils Relative: 5 %
HCT: 36.1 % — ABNORMAL LOW (ref 39.0–52.0)
Hemoglobin: 12.2 g/dL — ABNORMAL LOW (ref 13.0–17.0)
Immature Granulocytes: 0 %
Lymphocytes Relative: 16 %
Lymphs Abs: 1.5 10*3/uL (ref 0.7–4.0)
MCH: 32.3 pg (ref 26.0–34.0)
MCHC: 33.8 g/dL (ref 30.0–36.0)
MCV: 95.5 fL (ref 80.0–100.0)
Monocytes Absolute: 1.2 10*3/uL — ABNORMAL HIGH (ref 0.1–1.0)
Monocytes Relative: 13 %
Neutro Abs: 6 10*3/uL (ref 1.7–7.7)
Neutrophils Relative %: 65 %
Platelet Count: 209 10*3/uL (ref 150–400)
RBC: 3.78 MIL/uL — ABNORMAL LOW (ref 4.22–5.81)
RDW: 13.5 % (ref 11.5–15.5)
WBC Count: 9.2 10*3/uL (ref 4.0–10.5)
nRBC: 0 % (ref 0.0–0.2)

## 2022-04-26 LAB — CMP (CANCER CENTER ONLY)
ALT: 13 U/L (ref 0–44)
AST: 13 U/L — ABNORMAL LOW (ref 15–41)
Albumin: 3.9 g/dL (ref 3.5–5.0)
Alkaline Phosphatase: 127 U/L — ABNORMAL HIGH (ref 38–126)
Anion gap: 6 (ref 5–15)
BUN: 21 mg/dL (ref 8–23)
CO2: 29 mmol/L (ref 22–32)
Calcium: 9.7 mg/dL (ref 8.9–10.3)
Chloride: 103 mmol/L (ref 98–111)
Creatinine: 1.2 mg/dL (ref 0.61–1.24)
GFR, Estimated: >60 mL/min (ref >60)
Glucose, Bld: 113 mg/dL — ABNORMAL HIGH (ref 70–99)
Potassium: 4.7 mmol/L (ref 3.5–5.1)
Sodium: 138 mmol/L (ref 135–145)
Total Bilirubin: 0.6 mg/dL (ref 0.3–1.2)
Total Protein: 7 g/dL (ref 6.5–8.1)

## 2022-04-27 ENCOUNTER — Inpatient Hospital Stay: Payer: Medicare Other

## 2022-04-27 ENCOUNTER — Other Ambulatory Visit: Payer: Medicare Other

## 2022-04-27 ENCOUNTER — Encounter (HOSPITAL_COMMUNITY): Payer: Self-pay

## 2022-04-27 ENCOUNTER — Ambulatory Visit: Payer: Medicare Other

## 2022-04-27 ENCOUNTER — Inpatient Hospital Stay (HOSPITAL_BASED_OUTPATIENT_CLINIC_OR_DEPARTMENT_OTHER): Payer: Medicare Other | Admitting: Internal Medicine

## 2022-04-27 ENCOUNTER — Encounter: Payer: Self-pay | Admitting: Internal Medicine

## 2022-04-27 ENCOUNTER — Inpatient Hospital Stay (HOSPITAL_BASED_OUTPATIENT_CLINIC_OR_DEPARTMENT_OTHER): Payer: Medicare Other | Admitting: Nurse Practitioner

## 2022-04-27 ENCOUNTER — Encounter: Payer: Self-pay | Admitting: Nurse Practitioner

## 2022-04-27 ENCOUNTER — Ambulatory Visit (HOSPITAL_COMMUNITY)
Admission: RE | Admit: 2022-04-27 | Discharge: 2022-04-27 | Disposition: A | Discharge status: Home / Self Care | Payer: Medicare Other | Source: Ambulatory Visit | Attending: Radiation Oncology | Admitting: Radiation Oncology

## 2022-04-27 VITALS — BP 147/53 | HR 51 | Temp 98.1°F | Resp 15 | Wt 167.9 lb

## 2022-04-27 DIAGNOSIS — Z515 Encounter for palliative care: Secondary | ICD-10-CM | POA: Diagnosis not present

## 2022-04-27 DIAGNOSIS — Z7901 Long term (current) use of anticoagulants: Secondary | ICD-10-CM | POA: Diagnosis not present

## 2022-04-27 DIAGNOSIS — G893 Neoplasm related pain (acute) (chronic): Secondary | ICD-10-CM

## 2022-04-27 DIAGNOSIS — I4891 Unspecified atrial fibrillation: Secondary | ICD-10-CM | POA: Diagnosis not present

## 2022-04-27 DIAGNOSIS — C3432 Malignant neoplasm of lower lobe, left bronchus or lung: Secondary | ICD-10-CM | POA: Diagnosis not present

## 2022-04-27 DIAGNOSIS — C7951 Secondary malignant neoplasm of bone: Secondary | ICD-10-CM

## 2022-04-27 DIAGNOSIS — C3490 Malignant neoplasm of unspecified part of unspecified bronchus or lung: Secondary | ICD-10-CM

## 2022-04-27 DIAGNOSIS — Z79899 Other long term (current) drug therapy: Secondary | ICD-10-CM | POA: Diagnosis not present

## 2022-04-27 DIAGNOSIS — Z86718 Personal history of other venous thrombosis and embolism: Secondary | ICD-10-CM | POA: Diagnosis not present

## 2022-04-27 DIAGNOSIS — Z923 Personal history of irradiation: Secondary | ICD-10-CM | POA: Diagnosis not present

## 2022-04-27 DIAGNOSIS — S32591A Other specified fracture of right pubis, initial encounter for closed fracture: Secondary | ICD-10-CM | POA: Diagnosis not present

## 2022-04-27 DIAGNOSIS — S32511A Fracture of superior rim of right pubis, initial encounter for closed fracture: Secondary | ICD-10-CM | POA: Diagnosis not present

## 2022-04-27 MED ORDER — DENOSUMAB 120 MG/1.7ML ~~LOC~~ SOLN
120.0000 mg | Freq: Once | SUBCUTANEOUS | Status: AC
  Administered 2022-04-27: 120 mg via SUBCUTANEOUS
  Filled 2022-04-27: qty 1.7

## 2022-04-27 MED ORDER — MORPHINE SULFATE ER 15 MG PO TBCR: 15.0000 mg | EXTENDED_RELEASE_TABLET | Freq: Two times a day (BID) | ORAL | 0 refills | Status: DC

## 2022-04-27 MED ORDER — SODIUM CHLORIDE (PF) 0.9 % IJ SOLN
INTRAMUSCULAR | Status: AC
  Filled 2022-04-27: qty 50

## 2022-04-27 MED ORDER — IOHEXOL 300 MG/ML  SOLN
100.0000 mL | Freq: Once | INTRAMUSCULAR | Status: AC | PRN
  Administered 2022-04-27: 100 mL via INTRAVENOUS

## 2022-04-27 NOTE — Progress Notes (Signed)
[place] [phone]   [phone]  OFFICE PROGRESS NOTE  Aura Dials, MD [place] [address]  DIAGNOSIS: Stage IV (T3, N0, M1c) non-small cell lung cancer, adenocarcinoma presented with cavitary left lower lobe lung mass in addition to other pulmonary nodules and scattered hypermetabolic osseous metastatic disease involving the spine, ribs and bony pelvis as well as left proximal humeral pathologic fracture.    Molecular Biomarkers performed by [employer]TER[employer](S) / [name]      [id number]  Afatinib, Dacomitinib, Erlotinib, Gefitinib, Osimertinib, Ramucirumab Neratinib   FGFR1Amplification  Erdafitinib, Lenvatinib, Nintedanib, Pazopanib, Pemigatinib, Ponatinib   [id number] None   PRIOR THERAPY: None   CURRENT THERAPY:  1) Palliative radiotherapy to the the left humerus under the care of Dr. Sondra Come. Last treatment on 04/24/2019 2) Targeted therapy with Tagrisso 80 p.o. daily.  He started the first dose on April 21, 2019.  Status post 36 months of treatment. 3) palliative radiotherapy to the right acetabulum under the care of Dr. Sondra Come.  INTERVAL HISTORY: Albert Perez 71 y.o. male returns to the clinic today for follow-up visit.  The patient is feeling fine with no concerning complaints except for the pain in the right hip area.  He had CT of the right hip performed earlier today and ordered by Dr. Sondra Come.  He denied having any current chest pain, shortness of breath, cough or hemoptysis.  He denied having any fever or chills.  He has no nausea, vomiting, diarrhea or constipation.  He has no headache or visual changes.  He has no skin rash.  He is followed by the palliative care clinic for his pain management.  He is here today for evaluation with repeat blood work.  MEDICAL HISTORY: Past Medical History:  Diagnosis Date   A-fib [facility])    PMH: 2000-2001   Arthritis    Cancer ([place])     melanoma 2018   Diarrhea    due to medications   DVT (deep venous thrombosis) (HCC)    Left brachial vein DVT (01/30/19, following fracture)   Dysrhythmia    afib -    ETOH abuse    GERD (gastroesophageal reflux disease)    ocassional   History of radiation therapy    Right Hip 02/08/22-02/19/22-Dr. Gery Pray   Lung cancer [facility]) dx'd 02/26/2019   Metastatic cancer to bone ([place]) dx'd 02/26/2019   Sleep apnea    do not wear CPAP   Stroke ([place]) 2019   " mini"   Vertigo    Wears glasses     ALLERGIES:  has No Known Allergies.  MEDICATIONS:  Current Outpatient Medications  Medication Sig Dispense Refill   acetaminophen (TYLENOL) 650 MG CR tablet Take 650 mg by mouth every 8 (eight) hours as needed for pain.     buPROPion (WELLBUTRIN XL) 150 MG 24 hr tablet Take 150 mg by mouth daily as needed (nicotine cravings).     carvedilol (COREG) 6.25 MG tablet TAKE 1 TABLET BY MOUTH TWICE A DAY 180 tablet 3   ELIQUIS 5 MG TABS tablet TAKE 1 TABLET BY MOUTH TWICE A DAY 60 tablet 2   loperamide (IMODIUM) 2 MG capsule Take 2 mg by mouth as needed for diarrhea or loose stools.     Multiple Vitamin (MULTIVITAMIN PO) Take 1 tablet by mouth daily. Centrum     Omega-3 Fatty Acids (OMEGA-3 PO) Take 1 capsule by mouth daily.  osimertinib mesylate (TAGRISSO) 80 MG tablet Take 1 tablet (80 mg total) by mouth daily. 30 tablet 1   OVER THE COUNTER MEDICATION Take 1 tablet by mouth daily. Standard Process, LivaPlex (Liver supplement)     oxyCODONE-acetaminophen (PERCOCET) 10-325 MG tablet Take 1 tablet by mouth every 8 (eight) hours as needed for pain. 60 tablet 0   rosuvastatin (CRESTOR) 5 MG tablet Take 5 mg by mouth daily.  2   tiZANidine (ZANAFLEX) 2 MG tablet Take 1 tablet (2 mg total) by mouth every 8 (eight) hours as needed for muscle spasms. 30 tablet 1   TURMERIC PO Take 1 capsule by mouth daily.     vitamin B-12 (CYANOCOBALAMIN) 1000 MCG tablet Take 1,000 mcg by mouth daily.     No  current facility-administered medications for this visit.   Facility-Administered Medications Ordered in Other Visits  Medication Dose Route Frequency Provider Last Rate Last Admin   sodium chloride (PF) 0.9 % injection             SURGICAL HISTORY:  Past Surgical History:  Procedure Laterality Date   ANKLE FRACTURE SURGERY Right 2003   COLONOSCOPY  2018   ELBOW SURGERY Left 1964   INGUINAL HERNIA REPAIR Bilateral 11/12/2020   Procedure: BILATERAL OPEN INGUINAL HERNIA REPAIR WITH MESH;  Surgeon: Coralie Keens, MD;  Location: [place];  Service: General;  Laterality: Bilateral;   melanoma removal  04/2017   Lt shoulder   RETINAL DETACHMENT SURGERY Right 1971   TONSILLECTOMY     VIDEO BRONCHOSCOPY WITH ENDOBRONCHIAL NAVIGATION N/A 03/28/2019   Procedure: VIDEO BRONCHOSCOPY WITH ENDOBRONCHIAL NAVIGATION and Biopsies;  Surgeon: Collene Gobble, MD;  Location: [facility] OR;  Service: Thoracic;  Laterality: N/A;    REVIEW OF SYSTEMS:  Constitutional: positive for fatigue Eyes: negative Ears, nose, mouth, throat, and face: negative Respiratory: negative Cardiovascular: negative Gastrointestinal: negative Genitourinary:negative Integument/breast: negative Hematologic/lymphatic: negative Musculoskeletal:positive for bone pain Neurological: negative Behavioral/Psych: negative Endocrine: negative Allergic/Immunologic: negative   PHYSICAL EXAMINATION: General appearance: alert, cooperative, fatigued, and no distress Head: Normocephalic, without obvious abnormality, atraumatic Neck: no adenopathy, no JVD, supple, symmetrical, trachea midline, and thyroid not enlarged, symmetric, no tenderness/mass/nodules Lymph nodes: Cervical, supraclavicular, and axillary nodes normal. Resp: clear to auscultation bilaterally Back: symmetric, no curvature. ROM normal. No CVA tenderness. Cardio: regular rate and rhythm, S1, S2 normal, no murmur, click, rub or gallop GI: soft, non-tender; bowel sounds normal;  no masses,  no organomegaly Extremities: extremities normal, atraumatic, no cyanosis or edema Neurologic: Alert and oriented X 3, normal strength and tone. Normal symmetric reflexes. Normal coordination and gait   ECOG PERFORMANCE STATUS: 1 - Symptomatic but completely ambulatory  Blood pressure (!) 147/53, pulse (!) 51, temperature 98.1 F (36.7 C), temperature source Temporal, resp. rate 15, weight 167 lb 14.4 oz (76.2 kg), SpO2 100 %.  LABORATORY DATA: Lab Results  Component Value Date   WBC 9.2 04/26/2022   HGB 12.2 (L) 04/26/2022   HCT 36.1 (L) 04/26/2022   MCV 95.5 04/26/2022   PLT 209 04/26/2022      Chemistry      Component Value Date/Time   NA 138 04/26/2022 1543   K 4.7 04/26/2022 1543   CL 103 04/26/2022 1543   CO2 29 04/26/2022 1543   BUN 21 04/26/2022 1543   CREATININE 1.20 04/26/2022 1543      Component Value Date/Time   CALCIUM 9.7 04/26/2022 1543   ALKPHOS 127 (H) 04/26/2022 1543   AST 13 (L) 04/26/2022 1543  ALT 13 04/26/2022 1543   BILITOT 0.6 04/26/2022 1543       RADIOGRAPHIC STUDIES: No results found.  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  He is status post palliative radiotherapy to the metastatic disease in the left arm. The patient is currently undergoing treatment with target therapy with Tagrisso 80 mg p.o. daily started 36  months ago. His scan showed no evidence for disease progression in the chest, or abdomen but there was new grossly lytic osseous metastatic lesion of the right acetabulum that is new compared to the previous imaging studies  with new sclerosis of the adjacent right superior pubic ramus.  The patient underwent palliative radiotherapy to this area under the care of Dr. Sondra Come.  He was also seen by orthopedic surgery at [facility] and he is currently on close monitoring and observation. He had repeat CT of the right hip performed earlier today.  The final report is still pending but reviewing the images there is metastatic disease involving the right iliac crest that probably contributing to his current pain.  The patient will see Dr. Sondra Come for consideration of palliative radiotherapy to this area. For the metastatic bone disease, we will proceed with his treatment with Xgeva every 4 weeks. For the pain management he is currently followed by the palliative care team and on treatment with Percocet. The patient was advised to continue his current treatment with Tagrisso. I will see him back for follow-up visit in 2 months for evaluation with repeat CT scan of the chest, abdomen and pelvis for restaging of his disease. He was advised to call immediately if he has any other concerning symptoms in the interval.  The patient voices understanding of current disease status and treatment options and is in agreement with the current care plan. All questions were answered. The patient knows to call the clinic with any problems, questions or concerns. We can certainly see the patient much sooner if necessary.  Disclaimer: This note was dictated with voice recognition software. Similar sounding words can inadvertently be transcribed and may not be corrected upon review.

## 2022-04-27 NOTE — Patient Instructions (Signed)
Denosumab Injection (Oncology) What is this medication? DENOSUMAB (den oh SUE mab) prevents weakened bones caused by cancer. It may also be used to treat noncancerous bone tumors that cannot be removed by surgery. It can also be used to treat high calcium levels in the blood caused by cancer. It works by blocking a protein that causes bones to break down quickly. This slows down the release of calcium from bones, which lowers calcium levels in your blood. It also makes your bones stronger and less likely to break (fracture). This medicine may be used for other purposes; ask your health care provider or pharmacist if you have questions. COMMON BRAND NAME(S): XGEVA What should I tell my care team before I take this medication? They need to know if you have any of these conditions: Dental disease Having surgery or tooth extraction Infection Kidney disease Low levels of calcium or vitamin D in the blood Malnutrition On hemodialysis Skin conditions or sensitivity Thyroid or parathyroid disease An unusual reaction to denosumab, other medications, foods, dyes, or preservatives Pregnant or trying to get pregnant Breast-feeding How should I use this medication? This medication is for injection under the skin. It is given by your care team in a hospital or clinic setting. A special MedGuide will be given to you before each treatment. Be sure to read this information carefully each time. Talk to your care team about the use of this medication in children. While it may be prescribed for children as young as 13 years for selected conditions, precautions do apply. Overdosage: If you think you have taken too much of this medicine contact a poison control center or emergency room at once. NOTE: This medicine is only for you. Do not share this medicine with others. What if I miss a dose? Keep appointments for follow-up doses. It is important not to miss your dose. Call your care team if you are unable to  keep an appointment. What may interact with this medication? Do not take this medication with any of the following: Other medications containing denosumab This medication may also interact with the following: Medications that lower your chance of fighting infection Steroid medications, such as prednisone or cortisone This list may not describe all possible interactions. Give your health care provider a list of all the medicines, herbs, non-prescription drugs, or dietary supplements you use. Also tell them if you smoke, drink alcohol, or use illegal drugs. Some items may interact with your medicine. What should I watch for while using this medication? Your condition will be monitored carefully while you are receiving this medication. You may need blood work while taking this medication. This medication may increase your risk of getting an infection. Call your care team for advice if you get a fever, chills, sore throat, or other symptoms of a cold or flu. Do not treat yourself. Try to avoid being around people who are sick. You should make sure you get enough calcium and vitamin D while you are taking this medication, unless your care team tells you not to. Discuss the foods you eat and the vitamins you take with your care team. Some people who take this medication have severe bone, joint, or muscle pain. This medication may also increase your risk for jaw problems or a broken thigh bone. Tell your care team right away if you have severe pain in your jaw, bones, joints, or muscles. Tell your care team if you have any pain that does not go away or that gets worse. Talk   to your care team if you may be pregnant. Serious birth defects can occur if you take this medication during pregnancy and for 5 months after the last dose. You will need a negative pregnancy test before starting this medication. Contraception is recommended while taking this medication and for 5 months after the last dose. Your care team  can help you find the option that works for you. What side effects may I notice from receiving this medication? Side effects that you should report to your care team as soon as possible: Allergic reactions--skin rash, itching, hives, swelling of the face, lips, tongue, or throat Bone, joint, or muscle pain Low calcium level--muscle pain or cramps, confusion, tingling, or numbness in the hands or feet Osteonecrosis of the jaw--pain, swelling, or redness in the mouth, numbness of the jaw, poor healing after dental work, unusual discharge from the mouth, visible bones in the mouth Side effects that usually do not require medical attention (report to your care team if they continue or are bothersome): Cough Diarrhea Fatigue Headache Nausea This list may not describe all possible side effects. Call your doctor for medical advice about side effects. You may report side effects to FDA at 1-800-FDA-1088. Where should I keep my medication? This medication is given in a hospital or clinic. It will not be stored at home. NOTE: This sheet is a summary. It may not cover all possible information. If you have questions about this medicine, talk to your doctor, pharmacist, or health care provider.  2023 Elsevier/Gold Standard (2021-11-09 00:00:00)  

## 2022-04-27 NOTE — Progress Notes (Signed)
[place]  Telephone:([phone]) [phone] [phone]) [phone]   Name: Albert Perez Date: 04/27/2022 MRN: [id number]  DOB: 1950-12-10  Patient Care Team: Aura Dials, MD as PCP - General (Family Medicine) Constance Haw, MD as PCP - Electrophysiology (Cardiology) Lorretta Harp, MD as PCP - Cardiology (Cardiology) Gentry Fitz, MD as Consulting Physician (Orthopedic Surgery) Pickenpack-Cousar, [name], NP as Nurse Practitioner (Nurse Practitioner)    INTERVAL HISTORY: Albert Perez is a 71 y.o. 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 Recent scans  Palliative ask to see for.  Palliative ask to see for symptom management.  SOCIAL HISTORY:     reports that he has been smoking cigarettes. He has a 7.50 pack-year smoking history. He has never used smokeless tobacco. He reports current alcohol use of about 2.0 standard drinks of alcohol per week. He reports current drug use. Drug: Marijuana.  ADVANCE DIRECTIVES:    CODE STATUS:   PAST MEDICAL HISTORY: Past Medical History:  Diagnosis Date   A-fib [facility])    PMH: 2000-2001   Arthritis    Cancer ([place])    melanoma 2018   Diarrhea    due to medications   DVT (deep venous thrombosis) (HCC)    Left brachial vein DVT (01/30/19, following fracture)   Dysrhythmia    afib -    ETOH abuse    GERD (gastroesophageal reflux disease)    ocassional   History of radiation therapy    Right Hip 02/08/22-02/19/22-Dr. Gery Pray   Lung cancer [facility]) dx'd 02/26/2019   Metastatic cancer to bone ([place]) dx'd 02/26/2019   Sleep apnea    do not wear CPAP   Stroke ([place]) 2019   " mini"   Vertigo    Wears glasses     ALLERGIES:  has No Known  Allergies.  MEDICATIONS:  Current Outpatient Medications  Medication Sig Dispense Refill   acetaminophen (TYLENOL) 650 MG CR tablet Take 650 mg by mouth every 8 (eight) hours as needed for pain.     buPROPion (WELLBUTRIN XL) 150 MG 24 hr tablet Take 150 mg by mouth daily as needed (nicotine cravings).     carvedilol (COREG) 6.25 MG tablet TAKE 1 TABLET BY MOUTH TWICE A DAY 180 tablet 3   ELIQUIS 5 MG TABS tablet TAKE 1 TABLET BY MOUTH TWICE A DAY 60 tablet 2   loperamide (IMODIUM) 2 MG capsule Take 2 mg by mouth as needed for diarrhea or loose stools. (Patient not taking: Reported on 04/27/2022)     Multiple Vitamin (MULTIVITAMIN PO) Take 1 tablet by mouth daily. Centrum     Omega-3 Fatty Acids (OMEGA-3 PO) Take 1 capsule by mouth daily.     osimertinib mesylate (TAGRISSO) 80 MG tablet Take 1 tablet (80 mg total) by mouth daily. 30 tablet 1   OVER THE COUNTER MEDICATION Take 1 tablet by mouth daily. Standard Process, LivaPlex (Liver supplement)     oxyCODONE-acetaminophen (PERCOCET) 10-325 MG tablet Take 1 tablet by mouth every 8 (eight) hours as needed for pain. 60 tablet 0   rosuvastatin (CRESTOR) 5 MG tablet Take 5 mg by mouth daily.  2   tiZANidine (ZANAFLEX) 2 MG tablet Take 1 tablet (2 mg total) by mouth every 8 (eight) hours as needed for muscle spasms. 30 tablet 1  TURMERIC PO Take 1 capsule by mouth daily.     vitamin B-12 (CYANOCOBALAMIN) 1000 MCG tablet Take 1,000 mcg by mouth daily.     No current facility-administered medications for this visit.   Facility-Administered Medications Ordered in Other Visits  Medication Dose Route Frequency Provider Last Rate Last Admin   sodium chloride (PF) 0.9 % injection             VITAL SIGNS: There were no vitals taken for this visit. There were no vitals filed for this visit.  Estimated body mass index is 26.3 kg/m as calculated from the following:   Height as of 04/22/22: 5\' 7"  (1.702 m).   Weight as of an earlier encounter on  04/27/22: 167 lb 14.4 oz (76.2 kg).   PERFORMANCE STATUS (ECOG) : 1 - Symptomatic but completely ambulatory   Physical Exam General: NAD, right crutch support  Cardiovascular: regular rate and rhythm Pulmonary: clear ant fields Extremities: no edema, no joint deformities Skin: no rashes Neurological: AAO x3, mood appropriate   IMPRESSION: Albert Perez presents to clinic today for symptom management follow-up. He was doing well with pain management until several weeks ago when pain returned and escalated. He had a recent CT scan which shows a new area to right hip.   Denies nausea, vomiting, constipation, or diarrhea. His biggest concern is his increasing pain and discomfort. He is trying to continue working as best he can a few hours a day. Shares he is unable to sit for long periods of time as pain will begin to shoot down his right leg and constantly throb.  We discussed his current pain regimen.  He is taking Percocet 10/325 every 8 hours as needed however pain seems to escalate around 4-5 hours. Also tends to be worst later in the evening and night. He is unable to sleep comfortably. No longer sleeping in his bed and is sleeping in his recliner. He has tried to push through the pain throughout the day but shares emotionally his "misery" due to the pain. Emotional support provided.   Education provided on the use of pain medications. He has been instructed to increase his Percocet to every 4-6 hours.  We will also start him on MS Contin 15 mg for more long-term pain management.  He is hopeful his pain will be improved with recommended changes.  Education provided on dosing and potential side effects with starting MS Contin.  Education provided on bowel regimen in the setting of opioid use.  Patient verbalized understanding.  We also discussed possibility of weaning down on medications once he starts radiation with hopes this will greatly improve his pain as it did previously.  We will continue  to closely monitor and adjust as needed.  PLAN: Percocet 10/325 mg every 4-6 hours as needed for breakthrough pain MS Contin 15 mg every 12 hours We will continue to closely monitor and adjust as needed. I will plan to see patient back in 1-2 weeks in collaboration with his other oncology/radiation appointments.  He knows to contact our office sooner if needed.   Patient expressed understanding and was in agreement with this plan. He also understands that He can call the clinic at any time with any questions, concerns, or complaints.   Any controlled substances utilized were prescribed in the context of palliative care. PDMP has been reviewed.   Time Total: 40 min   Visit consisted of counseling and education dealing with the complex and emotionally intense issues of symptom management  and palliative care in the setting of serious and potentially life-threatening illness.Greater than 50%  of this time was spent counseling and coordinating care related to the above assessment and plan.  Alda Lea, AGPCNP-BC  Palliative Medicine Team/[place]

## 2022-04-27 NOTE — Patient Instructions (Addendum)
-   increase your oxycodone (percocet) to 1 tablet every 4-6 hours as needed for pain - call the office [phone] when you are 2-3 days away from being out of medication  - pick up and take your long acting morphine (MS Contin) take every 12 hours (8am and 8pm OR 9am and 9pm, etc.)  - if you experience any rashes or shortness of breath stop taking the medication and call the office or emergency services.  - if you go more than 2 days without a bowel movement take sennakot or colace to prevent constipation

## 2022-04-28 ENCOUNTER — Ambulatory Visit
Admission: RE | Admit: 2022-04-28 | Discharge: 2022-04-28 | Disposition: A | Discharge status: Home / Self Care | Payer: Medicare Other | Source: Ambulatory Visit | Attending: Radiation Oncology | Admitting: Radiation Oncology

## 2022-04-28 ENCOUNTER — Encounter: Payer: Self-pay | Admitting: Radiation Oncology

## 2022-04-28 VITALS — BP 138/60 | HR 57 | Temp 97.5°F | Resp 20 | Ht 67.0 in | Wt 165.6 lb

## 2022-04-28 DIAGNOSIS — I700 Atherosclerosis of aorta: Secondary | ICD-10-CM | POA: Insufficient documentation

## 2022-04-28 DIAGNOSIS — C3490 Malignant neoplasm of unspecified part of unspecified bronchus or lung: Secondary | ICD-10-CM

## 2022-04-28 DIAGNOSIS — Z923 Personal history of irradiation: Secondary | ICD-10-CM | POA: Insufficient documentation

## 2022-04-28 DIAGNOSIS — Z79899 Other long term (current) drug therapy: Secondary | ICD-10-CM | POA: Diagnosis not present

## 2022-04-28 DIAGNOSIS — F17210 Nicotine dependence, cigarettes, uncomplicated: Secondary | ICD-10-CM | POA: Diagnosis not present

## 2022-04-28 DIAGNOSIS — C3432 Malignant neoplasm of lower lobe, left bronchus or lung: Secondary | ICD-10-CM | POA: Diagnosis not present

## 2022-04-28 DIAGNOSIS — C7951 Secondary malignant neoplasm of bone: Secondary | ICD-10-CM | POA: Diagnosis not present

## 2022-04-28 DIAGNOSIS — Z7901 Long term (current) use of anticoagulants: Secondary | ICD-10-CM | POA: Insufficient documentation

## 2022-04-28 DIAGNOSIS — C3492 Malignant neoplasm of unspecified part of left bronchus or lung: Secondary | ICD-10-CM

## 2022-04-28 NOTE — Progress Notes (Signed)
Radiation Oncology         [phone] ________________________________  Name: Albert Perez MRN: [id number]  Date: 04/28/2022  DOB: [birth year]  Follow-Up Visit Note  CC: Aura Dials, MD  Aura Dials, MD    ICD-10-CM   1. Lung cancer metastatic to bone [facility]) [C34.90, C79.51]  C34.90    C79.51     2. Adenocarcinoma of left lung, stage 4 (HCC)  C34.92       Diagnosis:   The primary encounter diagnosis was Lung cancer metastatic to bone [facility]). A diagnosis of Adenocarcinoma of left lung, stage 4 (HCC) was also pertinent to this visit.   Increasing right hip pain associated with new lytic destructive lesions of the right iliac wing and right inferior pubic ramus (October 2023)   Stage IV (T3, N0, M1c) non-small cell lung cancer, adenocarcinoma: presented with cavitary left lower lobe lung mass in addition to other pulmonary nodules and scattered hypermetabolic osseous metastatic disease involving the spine, ribs and bony pelvis as well as left proximal humeral pathologic fracture.   Interval Since Last Radiation: 2 months and 1 week  Intent: Palliative  Radiation Treatment Dates: 02/08/2022 through 02/19/2022 Site Technique Total Dose (Gy) Dose per Fx (Gy) Completed Fx Beam Energies  Hip, Right: Pelvis_R 3D 30/30 3 10/10 10X, 15X    Radiation Treatment Dates: 04/11/2019 through 04/24/2019 Site Technique Total Dose (Gy) Dose per Fx (Gy) Completed Fx Beam Energies  Extremity: Ext_Lt_humerus Complex 30/30 3 10/10 10X    Narrative:  The patient returns today for  follow-up to review recent imaging and for consideration.discussion of palliative radiation to several new right hip lesions . He was last seen here for follow-up on 04/22/22.  To review from our last visit. I had arranged for an urgent CT scan of the pelvis to be performed for evaluation of his increasing right hip pain.    CT of the pelvis performed yesterday unfortunately showed a new lytic destructive lesions of the  right iliac wing and right inferior pubic ramus, both with extraosseous extension, and compatible with progressive osseous metastatic disease. CT also showed the permeative lytic lesion of the right acetabulum and quadrilateral plate as relatively stable in appearance, and with extraosseous extension just deep to the obturator internus muscle. Pathologic fracture through the anterior acetabular wall/lateral right superior pubic ramus was also appreciated.  Lastly, CT showed scattered sclerotic lesions in the bony pelvis likely associated with previously treated malignancy.  Accordingly, the patient followed up with Dr. Julien Nordmann yesterday to review imaging findings. Given evidence of progression, the patient will proceed with Xgeva q 4 weeks (first infusion yesterday). The patient has also been instructed to continue his current treatment consisting of tagrisso. The patient will return to Dr. Julien Nordmann in about 2 months for evaluation and repeat imaging for restaging of his disease.   The patient also met with palliative medicine yesterday. Given his significant pain, he was instructed to increase his percocet to every 4-6 hours (previously every 8 hours), and will begin taking MS contin 81m every 12 hours.     Allergies:  has No Known Allergies.  Meds: Current Outpatient Medications  Medication Sig Dispense Refill   acetaminophen (TYLENOL) 650 MG CR tablet Take 650 mg by mouth every 8 (eight) hours as needed for pain.     buPROPion (WELLBUTRIN XL) 150 MG 24 hr tablet Take 150 mg by mouth daily as needed (nicotine cravings).     carvedilol (COREG) 6.25 MG tablet TAKE 1 TABLET  BY MOUTH TWICE A DAY 180 tablet 3   ELIQUIS 5 MG TABS tablet TAKE 1 TABLET BY MOUTH TWICE A DAY 60 tablet 2   loperamide (IMODIUM) 2 MG capsule Take 2 mg by mouth as needed for diarrhea or loose stools. (Patient not taking: Reported on 04/27/2022)     morphine (MS CONTIN) 15 MG 12 hr tablet Take 1 tablet (15 mg total) by mouth  every 12 (twelve) hours. 30 tablet 0   Multiple Vitamin (MULTIVITAMIN PO) Take 1 tablet by mouth daily. Centrum     Omega-3 Fatty Acids (OMEGA-3 PO) Take 1 capsule by mouth daily.     osimertinib mesylate (TAGRISSO) 80 MG tablet Take 1 tablet (80 mg total) by mouth daily. 30 tablet 1   OVER THE COUNTER MEDICATION Take 1 tablet by mouth daily. Standard Process, LivaPlex (Liver supplement)     oxyCODONE-acetaminophen (PERCOCET) 10-325 MG tablet Take 1 tablet by mouth every 8 (eight) hours as needed for pain. 60 tablet 0   rosuvastatin (CRESTOR) 5 MG tablet Take 5 mg by mouth daily.  2   tiZANidine (ZANAFLEX) 2 MG tablet Take 1 tablet (2 mg total) by mouth every 8 (eight) hours as needed for muscle spasms. 30 tablet 1   TURMERIC PO Take 1 capsule by mouth daily.     vitamin B-12 (CYANOCOBALAMIN) 1000 MCG tablet Take 1,000 mcg by mouth daily.     No current facility-administered medications for this encounter.    Physical Findings: The patient is in no acute distress. Patient is alert and oriented.  height is '5\' 7"'  (1.702 m) and weight is 165 lb 9.6 oz (75.1 kg). His temperature is 97.5 F (36.4 C) (abnormal). His blood pressure is 138/60 and his pulse is 57 (abnormal). His respiration is 20 and oxygen saturation is 100%. .   Lungs are clear to auscultation bilaterally. Heart has regular rate and rhythm. No palpable cervical, supraclavicular, or axillary adenopathy. Abdomen soft, non-tender, normal bowel sounds.  Continues to ambulate with the assistance of a crutch   Lab Findings: Lab Results  Component Value Date   WBC 9.2 04/26/2022   HGB 12.2 (L) 04/26/2022   HCT 36.1 (L) 04/26/2022   MCV 95.5 04/26/2022   PLT 209 04/26/2022    Radiographic Findings: CT Pelvis W Contrast  Result Date: 04/27/2022 CLINICAL DATA:  Metastatic lung cancer to bone, radiation therapy to the right hip. EXAM: CT PELVIS WITH CONTRAST TECHNIQUE: Multidetector CT imaging of the pelvis was performed using the  standard protocol following the bolus administration of intravenous contrast. RADIATION DOSE REDUCTION: This exam was performed according to the departmental dose-optimization program which includes automated exposure control, adjustment of the mA and/or kV according to patient size and/or use of iterative reconstruction technique. CONTRAST:  11m OMNIPAQUE IOHEXOL 300 MG/ML  SOLN COMPARISON:  02/26/2022 FINDINGS: Urinary Tract:  Unremarkable Bowel:  Unremarkable Vascular/Lymphatic: Atherosclerosis is present, including aortoiliac atherosclerotic disease. Right external iliac node 0.7 cm in short axis on image 22 series 3, previously 0.9 cm. Reproductive:  Unremarkable Other:  No supplemental non-categorized findings. Musculoskeletal: Stable chronic metastatic lesion with lytic and sclerotic components in the left iliac wing. A primarily lytic lesion of the right iliac wing appears essentially new from 02/26/2022, with bony destructive findings and extraosseous extension, measuring 5.9 by 4.2 cm on image 23 series 3 The permeative lytic lesion of the right acetabulum and quadrilateral plate is again observed with extraosseous extension just deep to the obturator internus muscle. This lesion measures  6.8 by 1.9 cm on image 34 series 3, formerly same by my measurements. Pathologic fracture through the anterior acetabular wall/lateral right superior pubic ramus. New lytic lesion of the right inferior pubic ramus 2.2 by 1.9 cm with cortical destruction and mild extraosseous extension on image 44 series 3. Scattered sclerotic lesions in the bony pelvis likely from previously treated malignancy. Schmorl's nodes along the endplates of L4. Mild grade 1 anterolisthesis at L5-S1. Substantial sclerosis in the right superior pubic ramus likely from prior treated fracture. Chronic appearing lucency in the left ischium, query geode, unchanged from prior. IMPRESSION: 1. New lytic destructive lesions of the right iliac wing and  right inferior pubic ramus, both with extraosseous extension, compatible with progressive osseous metastatic disease. 2. Stable appearance of the permeative lytic lesion of the right acetabulum and quadrilateral plate, with extraosseous extension just deep to the obturator internus muscle. Pathologic fracture through the anterior acetabular wall/lateral right superior pubic ramus. 3. Scattered sclerotic lesions in the bony pelvis likely from previously treated malignancy. 4. Aortic atherosclerosis. Aortic Atherosclerosis (ICD10-I70.0). Electronically Signed   By: Van Clines M.D.   On: 04/27/2022 15:18    Impression:  Increasing right hip pain associated with new lytic destructive lesions of the right iliac wing and right inferior pubic ramus (October 2023)   Stage IV (T3, N0, M1c) non-small cell lung cancer, adenocarcinoma: presented with cavitary left lower lobe lung mass in addition to other pulmonary nodules and scattered hypermetabolic osseous metastatic disease involving the spine, ribs and bony pelvis as well as left proximal humeral pathologic fracture.   He has developed new areas of progression in the right pelvis area.  These are in different location from his previous radiation therapy.  I carefully reviewed his previous radiation fields and he would be a candidate for radiation therapy directed at these 2 areas.  There may potentially be some overlap with the right inferior pubic ramus field.  I discussed the general course of treatment anticipated side effects and potential toxicities of radiation therapy in this situation with patient.  He appears to understand and wishes to proceed with planned course of treatment.  Plan: He will return on October 31 for CT simulation.  Treatments to begin November 2.  Anticipate 10 treatments directed at the lesion along the right iliac wing and right inferior pubic ramus.   ____________________________________  Blair Promise, PhD, MD  This  document serves as a record of services personally performed by Gery Pray, MD. It was created on his behalf by Roney Mans, a trained medical scribe. The creation of this record is based on the scribe's personal observations and the provider's statements to them. This document has been checked and approved by the attending provider.

## 2022-04-28 NOTE — Progress Notes (Signed)
Albert Perez is here today for follow up post radiation to the pelvic.  They completed their radiation on: 02/19/22  Does the patient complain of any of the following:  Pain: Patient continues to have pain to right hip, rating 5/10. Patient continues to take Oxycodone-APAP- and Morphine.  Diarrhea/Constipation: Constipation, encouraged patient to take Miralax. Patient voiced understanding.   Nausea/Vomiting: No Urinary Issues (dysuria/incomplete emptying/ incontinence/ increased frequency/urgency): No Post radiation skin changes: No   Additional comments if applicable:    BP 051/83 (BP Location: Right Arm, Patient Position: Sitting, Cuff Size: Normal)   Pulse (!) 57   Temp (!) 97.5 F (36.4 C)   Resp 20   Ht 5\' 7"  (1.702 m)   Wt 165 lb 9.6 oz (75.1 kg)   SpO2 100%   BMI 25.94 kg/m

## 2022-04-30 ENCOUNTER — Telehealth: Payer: Self-pay

## 2022-04-30 NOTE — Telephone Encounter (Signed)
Pt wife, joyce, called and left voicemail with a callback number and no further details. Attempted to callback, no answer, unable to LVM.

## 2022-04-30 NOTE — Telephone Encounter (Signed)
Pt wife called back reporting that pt husband was in pain and that his medications were not working. Upon questioning pt was not taking medications correctly. Pt wife educated on how to take medications safely and effectively, and briefly how the medications combined helped the pts pain. Pt wife voiced understanding and agreed to call back with any questions or concerns, none at this time.

## 2022-05-03 ENCOUNTER — Other Ambulatory Visit: Payer: Self-pay

## 2022-05-03 ENCOUNTER — Ambulatory Visit
Admission: RE | Admit: 2022-05-03 | Discharge: 2022-05-03 | Disposition: A | Discharge status: Home / Self Care | Payer: Medicare Other | Source: Ambulatory Visit | Attending: Radiation Oncology | Admitting: Radiation Oncology

## 2022-05-03 DIAGNOSIS — C3490 Malignant neoplasm of unspecified part of unspecified bronchus or lung: Secondary | ICD-10-CM | POA: Diagnosis not present

## 2022-05-03 DIAGNOSIS — C7951 Secondary malignant neoplasm of bone: Secondary | ICD-10-CM | POA: Diagnosis not present

## 2022-05-03 DIAGNOSIS — C3432 Malignant neoplasm of lower lobe, left bronchus or lung: Secondary | ICD-10-CM | POA: Diagnosis not present

## 2022-05-03 DIAGNOSIS — F17210 Nicotine dependence, cigarettes, uncomplicated: Secondary | ICD-10-CM | POA: Diagnosis not present

## 2022-05-04 ENCOUNTER — Telehealth: Payer: Self-pay | Admitting: Pharmacy Technician

## 2022-05-04 NOTE — Telephone Encounter (Signed)
Oral Oncology Patient Advocate Encounter   Received notification that the application for renewed assistance for Tagrisso through [employer] was denied due to changed income requirements.  [employer] phone number [phone].   I have spoken to the patient.  Patient has been added to the site grant waitlist.  Lady Deutscher, CPhT-Adv Oncology Pharmacy Patient [place] Direct Number: [phone]  Fax: [phone]

## 2022-05-05 ENCOUNTER — Other Ambulatory Visit: Payer: Self-pay | Admitting: Nurse Practitioner

## 2022-05-05 ENCOUNTER — Telehealth: Payer: Self-pay

## 2022-05-05 DIAGNOSIS — C7951 Secondary malignant neoplasm of bone: Secondary | ICD-10-CM | POA: Insufficient documentation

## 2022-05-05 DIAGNOSIS — C3432 Malignant neoplasm of lower lobe, left bronchus or lung: Secondary | ICD-10-CM | POA: Diagnosis not present

## 2022-05-05 DIAGNOSIS — F17210 Nicotine dependence, cigarettes, uncomplicated: Secondary | ICD-10-CM | POA: Diagnosis not present

## 2022-05-05 DIAGNOSIS — C3490 Malignant neoplasm of unspecified part of unspecified bronchus or lung: Secondary | ICD-10-CM

## 2022-05-05 DIAGNOSIS — Z515 Encounter for palliative care: Secondary | ICD-10-CM

## 2022-05-05 DIAGNOSIS — Z510 Encounter for antineoplastic radiation therapy: Secondary | ICD-10-CM | POA: Diagnosis not present

## 2022-05-05 DIAGNOSIS — G893 Neoplasm related pain (acute) (chronic): Secondary | ICD-10-CM

## 2022-05-05 MED ORDER — OXYCODONE HCL 5 MG PO TABS: 5.0000 mg | ORAL_TABLET | ORAL | 0 refills | Status: DC | PRN

## 2022-05-05 NOTE — Telephone Encounter (Signed)
Pt wife, Blanch Media, called reporting that pt pain was not improving despite taking the medications correctly. Lexine Baton, NP made aware. Med changed from percocet to oxycodone 1-2 tabs every 4 hours. Blanch Media made aware of changes, verbalized understanding. See new orders.

## 2022-05-06 ENCOUNTER — Ambulatory Visit
Admission: RE | Admit: 2022-05-06 | Discharge: 2022-05-06 | Disposition: A | Discharge status: Home / Self Care | Payer: Medicare Other | Source: Ambulatory Visit | Attending: Radiation Oncology | Admitting: Radiation Oncology

## 2022-05-06 ENCOUNTER — Other Ambulatory Visit: Payer: Self-pay

## 2022-05-06 DIAGNOSIS — C3432 Malignant neoplasm of lower lobe, left bronchus or lung: Secondary | ICD-10-CM | POA: Diagnosis not present

## 2022-05-06 DIAGNOSIS — C3490 Malignant neoplasm of unspecified part of unspecified bronchus or lung: Secondary | ICD-10-CM

## 2022-05-06 DIAGNOSIS — F17210 Nicotine dependence, cigarettes, uncomplicated: Secondary | ICD-10-CM | POA: Diagnosis not present

## 2022-05-06 DIAGNOSIS — C7951 Secondary malignant neoplasm of bone: Secondary | ICD-10-CM | POA: Diagnosis not present

## 2022-05-06 DIAGNOSIS — Z510 Encounter for antineoplastic radiation therapy: Secondary | ICD-10-CM | POA: Diagnosis not present

## 2022-05-06 LAB — RAD ONC ARIA SESSION SUMMARY
Course Elapsed Days: 0
Plan Fractions Treated to Date: 1
Plan Prescribed Dose Per Fraction: 3 Gy
Plan Total Fractions Prescribed: 10
Plan Total Prescribed Dose: 30 Gy
Reference Point Dosage Given to Date: 3 Gy
Reference Point Session Dosage Given: 3 Gy
Session Number: 1

## 2022-05-07 ENCOUNTER — Ambulatory Visit
Admission: RE | Admit: 2022-05-07 | Discharge: 2022-05-07 | Disposition: A | Discharge status: Home / Self Care | Payer: Medicare Other | Source: Ambulatory Visit | Attending: Radiation Oncology | Admitting: Radiation Oncology

## 2022-05-07 ENCOUNTER — Other Ambulatory Visit: Payer: Self-pay | Admitting: Nurse Practitioner

## 2022-05-07 ENCOUNTER — Telehealth: Payer: Self-pay

## 2022-05-07 ENCOUNTER — Other Ambulatory Visit: Payer: Self-pay

## 2022-05-07 DIAGNOSIS — C7951 Secondary malignant neoplasm of bone: Secondary | ICD-10-CM | POA: Diagnosis not present

## 2022-05-07 DIAGNOSIS — Z515 Encounter for palliative care: Secondary | ICD-10-CM

## 2022-05-07 DIAGNOSIS — C3432 Malignant neoplasm of lower lobe, left bronchus or lung: Secondary | ICD-10-CM | POA: Diagnosis not present

## 2022-05-07 DIAGNOSIS — Z510 Encounter for antineoplastic radiation therapy: Secondary | ICD-10-CM | POA: Diagnosis not present

## 2022-05-07 DIAGNOSIS — G893 Neoplasm related pain (acute) (chronic): Secondary | ICD-10-CM

## 2022-05-07 DIAGNOSIS — F17210 Nicotine dependence, cigarettes, uncomplicated: Secondary | ICD-10-CM | POA: Diagnosis not present

## 2022-05-07 LAB — RAD ONC ARIA SESSION SUMMARY
Course Elapsed Days: 1
Plan Fractions Treated to Date: 2
Plan Prescribed Dose Per Fraction: 3 Gy
Plan Total Fractions Prescribed: 10
Plan Total Prescribed Dose: 30 Gy
Reference Point Dosage Given to Date: 6 Gy
Reference Point Session Dosage Given: 3 Gy
Session Number: 2

## 2022-05-07 MED ORDER — OXYCODONE HCL 5 MG PO TABS: 5.0000 mg | ORAL_TABLET | ORAL | 0 refills | Status: DC | PRN

## 2022-05-07 MED ORDER — DEXAMETHASONE 4 MG PO TABS: 4.0000 mg | ORAL_TABLET | Freq: Every day | ORAL | 0 refills | Status: DC

## 2022-05-07 NOTE — Telephone Encounter (Signed)
Pt called and left a voicemail reporting a few bad nights d/t pain and difficulty lying flat for radiation. This RN attempted to call pt back twice with no answer. RN left VM saying that per Lexine Baton, NP, pt is to continue to take 1-2 tabs of oxycodone every 4 hours and to increase his MS Contin to 2 tabs (30mg ) every 12 hours. RN left call back number as well.

## 2022-05-07 NOTE — Telephone Encounter (Signed)
Pt called back to discuss his pain medications. Pt reports increased pain in leg and hip, pain when lying on table for radiation, and pain interfering with sleep. Per Lexine Baton, NP, pt to take 2 of the Ms contin (30mg ) q12, and he can increase his oxycodone to 15mg  q4. Pt also had dexamethasone called in to take for pain as well. Pt was educated on the importance of taking medications as prescribed to promote safety. Pt verbalized understanding reported he will call and/or use MyChart for any further questions or needs.

## 2022-05-10 ENCOUNTER — Other Ambulatory Visit: Payer: Self-pay

## 2022-05-10 ENCOUNTER — Encounter: Payer: Medicare Other | Admitting: Nurse Practitioner

## 2022-05-10 ENCOUNTER — Ambulatory Visit
Admission: RE | Admit: 2022-05-10 | Discharge: 2022-05-10 | Disposition: A | Discharge status: Home / Self Care | Payer: Medicare Other | Source: Ambulatory Visit | Attending: Radiation Oncology | Admitting: Radiation Oncology

## 2022-05-10 ENCOUNTER — Encounter: Payer: Self-pay | Admitting: Internal Medicine

## 2022-05-10 DIAGNOSIS — C7951 Secondary malignant neoplasm of bone: Secondary | ICD-10-CM | POA: Diagnosis not present

## 2022-05-10 DIAGNOSIS — C3432 Malignant neoplasm of lower lobe, left bronchus or lung: Secondary | ICD-10-CM | POA: Diagnosis not present

## 2022-05-10 DIAGNOSIS — Z510 Encounter for antineoplastic radiation therapy: Secondary | ICD-10-CM | POA: Diagnosis not present

## 2022-05-10 DIAGNOSIS — F17210 Nicotine dependence, cigarettes, uncomplicated: Secondary | ICD-10-CM | POA: Diagnosis not present

## 2022-05-10 LAB — RAD ONC ARIA SESSION SUMMARY
Course Elapsed Days: 4
Plan Fractions Treated to Date: 3
Plan Prescribed Dose Per Fraction: 3 Gy
Plan Total Fractions Prescribed: 10
Plan Total Prescribed Dose: 30 Gy
Reference Point Dosage Given to Date: 9 Gy
Reference Point Session Dosage Given: 3 Gy
Session Number: 3

## 2022-05-11 ENCOUNTER — Other Ambulatory Visit: Payer: Self-pay

## 2022-05-11 ENCOUNTER — Ambulatory Visit
Admission: RE | Admit: 2022-05-11 | Discharge: 2022-05-11 | Disposition: A | Discharge status: Home / Self Care | Payer: Medicare Other | Source: Ambulatory Visit | Attending: Radiation Oncology | Admitting: Radiation Oncology

## 2022-05-11 ENCOUNTER — Encounter: Payer: Self-pay | Admitting: Nurse Practitioner

## 2022-05-11 ENCOUNTER — Inpatient Hospital Stay (HOSPITAL_BASED_OUTPATIENT_CLINIC_OR_DEPARTMENT_OTHER): Payer: Medicare Other | Admitting: Nurse Practitioner

## 2022-05-11 VITALS — BP 160/77 | HR 61 | Temp 97.6°F | Resp 19 | Ht 67.0 in | Wt 162.6 lb

## 2022-05-11 DIAGNOSIS — C3432 Malignant neoplasm of lower lobe, left bronchus or lung: Secondary | ICD-10-CM | POA: Insufficient documentation

## 2022-05-11 DIAGNOSIS — Z9221 Personal history of antineoplastic chemotherapy: Secondary | ICD-10-CM | POA: Insufficient documentation

## 2022-05-11 DIAGNOSIS — G893 Neoplasm related pain (acute) (chronic): Secondary | ICD-10-CM

## 2022-05-11 DIAGNOSIS — C3490 Malignant neoplasm of unspecified part of unspecified bronchus or lung: Secondary | ICD-10-CM | POA: Diagnosis not present

## 2022-05-11 DIAGNOSIS — Z7901 Long term (current) use of anticoagulants: Secondary | ICD-10-CM | POA: Insufficient documentation

## 2022-05-11 DIAGNOSIS — Z86718 Personal history of other venous thrombosis and embolism: Secondary | ICD-10-CM | POA: Insufficient documentation

## 2022-05-11 DIAGNOSIS — Z923 Personal history of irradiation: Secondary | ICD-10-CM | POA: Insufficient documentation

## 2022-05-11 DIAGNOSIS — I4891 Unspecified atrial fibrillation: Secondary | ICD-10-CM | POA: Insufficient documentation

## 2022-05-11 DIAGNOSIS — Z79899 Other long term (current) drug therapy: Secondary | ICD-10-CM | POA: Insufficient documentation

## 2022-05-11 DIAGNOSIS — Z85820 Personal history of malignant melanoma of skin: Secondary | ICD-10-CM | POA: Insufficient documentation

## 2022-05-11 DIAGNOSIS — Z510 Encounter for antineoplastic radiation therapy: Secondary | ICD-10-CM | POA: Diagnosis not present

## 2022-05-11 DIAGNOSIS — C7951 Secondary malignant neoplasm of bone: Secondary | ICD-10-CM | POA: Insufficient documentation

## 2022-05-11 DIAGNOSIS — Z515 Encounter for palliative care: Secondary | ICD-10-CM | POA: Insufficient documentation

## 2022-05-11 DIAGNOSIS — Z8673 Personal history of transient ischemic attack (TIA), and cerebral infarction without residual deficits: Secondary | ICD-10-CM | POA: Insufficient documentation

## 2022-05-11 DIAGNOSIS — Z7952 Long term (current) use of systemic steroids: Secondary | ICD-10-CM | POA: Insufficient documentation

## 2022-05-11 DIAGNOSIS — F17210 Nicotine dependence, cigarettes, uncomplicated: Secondary | ICD-10-CM | POA: Insufficient documentation

## 2022-05-11 LAB — RAD ONC ARIA SESSION SUMMARY
Course Elapsed Days: 5
Plan Fractions Treated to Date: 4
Plan Prescribed Dose Per Fraction: 3 Gy
Plan Total Fractions Prescribed: 10
Plan Total Prescribed Dose: 30 Gy
Reference Point Dosage Given to Date: 12 Gy
Reference Point Session Dosage Given: 3 Gy
Session Number: 4

## 2022-05-11 MED ORDER — OXYCODONE ER 9 MG PO C12A: 9.0000 mg | EXTENDED_RELEASE_CAPSULE | Freq: Two times a day (BID) | ORAL | 0 refills | Status: DC

## 2022-05-11 NOTE — Progress Notes (Signed)
[place]  Telephone:([phone]) [phone] Fax:(336) [phone]   Name: Albert Perez Date: 05/11/2022 MRN: [id number]  DOB: [birth year]  Patient Care Team: Aura Dials, MD as PCP - General (Family Medicine) Constance Haw, MD as PCP - Electrophysiology (Cardiology) Lorretta Harp, MD as PCP - Cardiology (Cardiology) Gentry Fitz, MD as Consulting Physician (Orthopedic Surgery) Pickenpack-Cousar, [name], NP as Nurse Practitioner (Nurse Practitioner)    INTERVAL HISTORY: Albert Perez is a 71 y.o. 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 Recent scans  Palliative ask to see for.  Palliative ask to see for symptom management.  SOCIAL HISTORY:     reports that he has been smoking cigarettes. He has a 7.50 pack-year smoking history. He has never used smokeless tobacco. He reports current alcohol use of about 2.0 standard drinks of alcohol per week. He reports current drug use. Drug: Marijuana.  ADVANCE DIRECTIVES:    CODE STATUS:   PAST MEDICAL HISTORY: Past Medical History:  Diagnosis Date   A-fib [facility])    PMH: 2000-2001   Arthritis    Cancer ([place])    melanoma 2018   Diarrhea    due to medications   DVT (deep venous thrombosis) (HCC)    Left brachial vein DVT (01/30/19, following fracture)   Dysrhythmia    afib -    ETOH abuse    GERD (gastroesophageal reflux disease)    ocassional   History of radiation therapy    Right Hip 02/08/22-02/19/22-Dr. Gery Pray   Lung cancer [facility]) dx'd 02/26/2019   Metastatic cancer to bone ([place]) dx'd 02/26/2019   Sleep apnea    do not wear CPAP   Stroke ([place]) 2019   " mini"   Vertigo    Wears glasses     ALLERGIES:  has No Known  Allergies.  MEDICATIONS:  Current Outpatient Medications  Medication Sig Dispense Refill   oxyCODONE ER 9 MG C12A Take 9 mg by mouth every 12 (twelve) hours. 60 capsule 0   acetaminophen (TYLENOL) 650 MG CR tablet Take 650 mg by mouth every 8 (eight) hours as needed for pain.     buPROPion (WELLBUTRIN XL) 150 MG 24 hr tablet Take 150 mg by mouth daily as needed (nicotine cravings).     carvedilol (COREG) 6.25 MG tablet TAKE 1 TABLET BY MOUTH TWICE A DAY 180 tablet 3   dexamethasone (DECADRON) 4 MG tablet Take 1 tablet (4 mg total) by mouth daily. 7 tablet 0   ELIQUIS 5 MG TABS tablet TAKE 1 TABLET BY MOUTH TWICE A DAY 60 tablet 2   loperamide (IMODIUM) 2 MG capsule Take 2 mg by mouth as needed for diarrhea or loose stools. (Patient not taking: Reported on 04/27/2022)     Multiple Vitamin (MULTIVITAMIN PO) Take 1 tablet by mouth daily. Centrum     Omega-3 Fatty Acids (OMEGA-3 PO) Take 1 capsule by mouth daily.     osimertinib mesylate (TAGRISSO) 80 MG tablet Take 1 tablet (80 mg total) by mouth daily. 30 tablet 1   OVER THE COUNTER MEDICATION Take 1 tablet by mouth daily. Standard Process, LivaPlex (Liver supplement)     oxyCODONE (OXY IR/ROXICODONE) 5 MG immediate release tablet Take 1-2 tablets (5-10 mg total) by mouth every 4 (four) hours as needed for severe pain.  30 tablet 0   rosuvastatin (CRESTOR) 5 MG tablet Take 5 mg by mouth daily.  2   tiZANidine (ZANAFLEX) 2 MG tablet Take 1 tablet (2 mg total) by mouth every 8 (eight) hours as needed for muscle spasms. 30 tablet 1   TURMERIC PO Take 1 capsule by mouth daily.     vitamin B-12 (CYANOCOBALAMIN) 1000 MCG tablet Take 1,000 mcg by mouth daily.     No current facility-administered medications for this visit.    VITAL SIGNS: BP (!) 160/77 (BP Location: Right Arm, Patient Position: Sitting)   Pulse 61   Temp 97.6 F (36.4 C) (Oral)   Resp 19   Ht 5\' 7"  (1.702 m)   Wt 162 lb 9.6 oz (73.8 kg)   SpO2 100%   BMI 25.47 kg/m  Filed  Weights   05/11/22 1547  Weight: 162 lb 9.6 oz (73.8 kg)    Estimated body mass index is 25.47 kg/m as calculated from the following:   Height as of this encounter: 5\' 7"  (1.702 m).   Weight as of this encounter: 162 lb 9.6 oz (73.8 kg).   PERFORMANCE STATUS (ECOG) : 1 - Symptomatic but completely ambulatory   Physical Exam General: NAD, crutch support  Cardiovascular: regular rate and rhythm Pulmonary: normal breathing pattern  Extremities: no edema, no joint deformities Skin: no rashes Neurological: AAO x3, mood appropriate   IMPRESSION: [name] presents to clinic today for symptom management follow-up. His wife is present. He is remaining as active as possible however his pain causes some limitation. Actively undergoing radiation.   Denies nausea, vomiting, constipation, or diarrhea.   Continues to have ongoing pain despite current regimen. States he is unable to sit for long periods of time. Pain interrupts his ability to sleep. We recently started him on dexamethasone which he feels allowed for some improvement but unfortunately did not help with his insomnia.   We discussed current pain regimen at length. Mr. Golda is taking oxycodone IR 10-15 mg every 4 hours as needed for pain. He states he stopped taking his MS Contin because he did not feel that it was helping. Education provide on use of long-acting medication in support of immediate release. He understands he may not see a response as quickly as he does with short-acting providing mechanism of action. He verbalized understanding. [name] feels he may have had better relief with percocet after paying attention to his pain. Discussed continued use of Oxy IR with ability to add in tylenol every 6-8 hours. Education provide don limitations in daily amount of tylenol. He and wife verbalized understanding. Given no improvement despite increasing dosage of MS Contin we will plan to switch to [facility] with hopes of improvement. Education  provided on use and changes.   We will continue to closely monitor and adjust as needed.   PLAN: Oxycodone 10-15mg  every 4-6 hours as needed for breakthrough pain Discontinue MS Contin Xtampza 9mg  every 12 hours  Miralax daily for bowel regimen  We will continue to closely monitor and adjust as needed. I will plan to see patient back in 1-2 weeks in collaboration with his other oncology/radiation appointments.  He knows to contact our office sooner if needed.   Patient expressed understanding and was in agreement with this plan. He also understands that He can call the clinic at any time with any questions, concerns, or complaints.    Any controlled substances utilized were prescribed in the context of palliative care. PDMP has been reviewed.  Time Total: 45 min   Visit consisted of counseling and education dealing with the complex and emotionally intense issues of symptom management and palliative care in the setting of serious and potentially life-threatening illness.Greater than 50%  of this time was spent counseling and coordinating care related to the above assessment and plan.  Alda Lea, AGPCNP-BC  Palliative Medicine Team/[place]

## 2022-05-11 NOTE — Patient Instructions (Addendum)
-   stop taking the MS Contin (morphine)  - pick up and take the long acting oxycodone (xtampza) 9mg  and take 1 pill every 12 hours (7am and 7pm) regardless of your pain level - take tylenol at home take 1 pill (500mg ) every 6 hours until the oxycodone runs out - continue to take the oxycodone until it runs out and call [facility]ki [phone] one day before the oxycodone runs out and Lexine Baton will change to a new prescription with combined oxycodone and tylenol  - continue to take a stool softner every day (colace or sennakot, or mirilax), if you do not have a BM every day take 2 times a day

## 2022-05-12 ENCOUNTER — Other Ambulatory Visit: Payer: Self-pay

## 2022-05-12 ENCOUNTER — Other Ambulatory Visit: Payer: Self-pay | Admitting: Medical Oncology

## 2022-05-12 ENCOUNTER — Encounter: Payer: Self-pay | Admitting: Medical Oncology

## 2022-05-12 ENCOUNTER — Ambulatory Visit
Admission: RE | Admit: 2022-05-12 | Discharge: 2022-05-12 | Disposition: A | Discharge status: Home / Self Care | Payer: Medicare Other | Source: Ambulatory Visit | Attending: Radiation Oncology | Admitting: Radiation Oncology

## 2022-05-12 DIAGNOSIS — Z510 Encounter for antineoplastic radiation therapy: Secondary | ICD-10-CM | POA: Diagnosis not present

## 2022-05-12 DIAGNOSIS — C7951 Secondary malignant neoplasm of bone: Secondary | ICD-10-CM | POA: Diagnosis not present

## 2022-05-12 DIAGNOSIS — C3432 Malignant neoplasm of lower lobe, left bronchus or lung: Secondary | ICD-10-CM | POA: Diagnosis not present

## 2022-05-12 DIAGNOSIS — C3492 Malignant neoplasm of unspecified part of left bronchus or lung: Secondary | ICD-10-CM

## 2022-05-12 DIAGNOSIS — F17210 Nicotine dependence, cigarettes, uncomplicated: Secondary | ICD-10-CM | POA: Diagnosis not present

## 2022-05-12 LAB — RAD ONC ARIA SESSION SUMMARY
Course Elapsed Days: 6
Plan Fractions Treated to Date: 5
Plan Prescribed Dose Per Fraction: 3 Gy
Plan Total Fractions Prescribed: 10
Plan Total Prescribed Dose: 30 Gy
Reference Point Dosage Given to Date: 15 Gy
Reference Point Session Dosage Given: 3 Gy
Session Number: 5

## 2022-05-12 MED ORDER — OSIMERTINIB MESYLATE 80 MG PO TABS: 80.0000 mg | ORAL_TABLET | Freq: Every day | ORAL | 1 refills | Status: DC

## 2022-05-13 ENCOUNTER — Other Ambulatory Visit: Payer: Self-pay | Admitting: Nurse Practitioner

## 2022-05-13 ENCOUNTER — Other Ambulatory Visit: Payer: Self-pay

## 2022-05-13 ENCOUNTER — Telehealth: Payer: Self-pay | Admitting: Pharmacist

## 2022-05-13 ENCOUNTER — Ambulatory Visit
Admission: RE | Admit: 2022-05-13 | Discharge: 2022-05-13 | Disposition: A | Discharge status: Home / Self Care | Payer: Medicare Other | Source: Ambulatory Visit | Attending: Radiation Oncology | Admitting: Radiation Oncology

## 2022-05-13 DIAGNOSIS — C3492 Malignant neoplasm of unspecified part of left bronchus or lung: Secondary | ICD-10-CM

## 2022-05-13 DIAGNOSIS — C7951 Secondary malignant neoplasm of bone: Secondary | ICD-10-CM | POA: Diagnosis not present

## 2022-05-13 DIAGNOSIS — C3490 Malignant neoplasm of unspecified part of unspecified bronchus or lung: Secondary | ICD-10-CM

## 2022-05-13 DIAGNOSIS — Z510 Encounter for antineoplastic radiation therapy: Secondary | ICD-10-CM | POA: Diagnosis not present

## 2022-05-13 DIAGNOSIS — G893 Neoplasm related pain (acute) (chronic): Secondary | ICD-10-CM

## 2022-05-13 DIAGNOSIS — Z515 Encounter for palliative care: Secondary | ICD-10-CM

## 2022-05-13 DIAGNOSIS — F17210 Nicotine dependence, cigarettes, uncomplicated: Secondary | ICD-10-CM | POA: Diagnosis not present

## 2022-05-13 DIAGNOSIS — C3432 Malignant neoplasm of lower lobe, left bronchus or lung: Secondary | ICD-10-CM | POA: Diagnosis not present

## 2022-05-13 LAB — RAD ONC ARIA SESSION SUMMARY
Course Elapsed Days: 7
Plan Fractions Treated to Date: 6
Plan Prescribed Dose Per Fraction: 3 Gy
Plan Total Fractions Prescribed: 10
Plan Total Prescribed Dose: 30 Gy
Reference Point Dosage Given to Date: 18 Gy
Reference Point Session Dosage Given: 3 Gy
Session Number: 6

## 2022-05-13 MED ORDER — OXYCODONE-ACETAMINOPHEN 10-325 MG PO TABS: 1.0000 | ORAL_TABLET | ORAL | 0 refills | Status: DC | PRN

## 2022-05-13 NOTE — Telephone Encounter (Signed)
Oral Chemotherapy Pharmacist Encounter  Successfully enrolled patient for copayment assistance funds from [employer] from the [employer].  Award amount: $6000 Effective dates: 11/14/21 - 05/13/23 ID: [id number] BIN: [id number] Group: [id number] PCN: [id number]  Billing information will be shared with [facility]. I will place a copy of the award letter to be scanned into patient's chart.  Left voicemail for patient to inform him of the above information.   Leron Croak, PharmD, BCPS, BCOP Hematology/Oncology Clinical Pharmacist [facility] [place] [phone] 05/13/2022 1:48 PM

## 2022-05-14 ENCOUNTER — Encounter: Payer: Medicare Other | Admitting: Nurse Practitioner

## 2022-05-14 ENCOUNTER — Ambulatory Visit
Admission: RE | Admit: 2022-05-14 | Discharge: 2022-05-14 | Disposition: A | Discharge status: Home / Self Care | Payer: Medicare Other | Source: Ambulatory Visit | Attending: Radiation Oncology | Admitting: Radiation Oncology

## 2022-05-14 ENCOUNTER — Other Ambulatory Visit: Payer: Self-pay

## 2022-05-14 ENCOUNTER — Encounter: Payer: Self-pay | Admitting: Internal Medicine

## 2022-05-14 ENCOUNTER — Other Ambulatory Visit (HOSPITAL_COMMUNITY): Payer: Self-pay

## 2022-05-14 DIAGNOSIS — C7951 Secondary malignant neoplasm of bone: Secondary | ICD-10-CM | POA: Diagnosis not present

## 2022-05-14 DIAGNOSIS — C3432 Malignant neoplasm of lower lobe, left bronchus or lung: Secondary | ICD-10-CM | POA: Diagnosis not present

## 2022-05-14 DIAGNOSIS — Z510 Encounter for antineoplastic radiation therapy: Secondary | ICD-10-CM | POA: Diagnosis not present

## 2022-05-14 DIAGNOSIS — F17210 Nicotine dependence, cigarettes, uncomplicated: Secondary | ICD-10-CM | POA: Diagnosis not present

## 2022-05-14 LAB — RAD ONC ARIA SESSION SUMMARY
Course Elapsed Days: 8
Plan Fractions Treated to Date: 7
Plan Prescribed Dose Per Fraction: 3 Gy
Plan Total Fractions Prescribed: 10
Plan Total Prescribed Dose: 30 Gy
Reference Point Dosage Given to Date: 21 Gy
Reference Point Session Dosage Given: 3 Gy
Session Number: 7

## 2022-05-17 ENCOUNTER — Other Ambulatory Visit: Payer: Self-pay

## 2022-05-17 ENCOUNTER — Ambulatory Visit
Admission: RE | Admit: 2022-05-17 | Discharge: 2022-05-17 | Disposition: A | Discharge status: Home / Self Care | Payer: Medicare Other | Source: Ambulatory Visit | Attending: Radiation Oncology | Admitting: Radiation Oncology

## 2022-05-17 ENCOUNTER — Inpatient Hospital Stay: Payer: Medicare Other | Admitting: Nurse Practitioner

## 2022-05-17 DIAGNOSIS — C7951 Secondary malignant neoplasm of bone: Secondary | ICD-10-CM | POA: Diagnosis not present

## 2022-05-17 DIAGNOSIS — C3432 Malignant neoplasm of lower lobe, left bronchus or lung: Secondary | ICD-10-CM | POA: Diagnosis not present

## 2022-05-17 DIAGNOSIS — F17210 Nicotine dependence, cigarettes, uncomplicated: Secondary | ICD-10-CM | POA: Diagnosis not present

## 2022-05-17 DIAGNOSIS — Z510 Encounter for antineoplastic radiation therapy: Secondary | ICD-10-CM | POA: Diagnosis not present

## 2022-05-17 LAB — RAD ONC ARIA SESSION SUMMARY
Course Elapsed Days: 11
Plan Fractions Treated to Date: 8
Plan Prescribed Dose Per Fraction: 3 Gy
Plan Total Fractions Prescribed: 10
Plan Total Prescribed Dose: 30 Gy
Reference Point Dosage Given to Date: 24 Gy
Reference Point Session Dosage Given: 3 Gy
Session Number: 8

## 2022-05-17 NOTE — Progress Notes (Signed)
This palliative RN attempted to call pt and check in on his pain management and how he felt over the weekend. Pt did not answer, LVM and callback number.

## 2022-05-18 ENCOUNTER — Ambulatory Visit
Admission: RE | Admit: 2022-05-18 | Discharge: 2022-05-18 | Disposition: A | Discharge status: Home / Self Care | Payer: Medicare Other | Source: Ambulatory Visit | Attending: Radiation Oncology | Admitting: Radiation Oncology

## 2022-05-18 ENCOUNTER — Other Ambulatory Visit: Payer: Self-pay

## 2022-05-18 ENCOUNTER — Other Ambulatory Visit: Payer: Self-pay | Admitting: Radiation Oncology

## 2022-05-18 DIAGNOSIS — C7951 Secondary malignant neoplasm of bone: Secondary | ICD-10-CM | POA: Diagnosis not present

## 2022-05-18 DIAGNOSIS — F17210 Nicotine dependence, cigarettes, uncomplicated: Secondary | ICD-10-CM | POA: Diagnosis not present

## 2022-05-18 DIAGNOSIS — C3432 Malignant neoplasm of lower lobe, left bronchus or lung: Secondary | ICD-10-CM | POA: Diagnosis not present

## 2022-05-18 DIAGNOSIS — Z510 Encounter for antineoplastic radiation therapy: Secondary | ICD-10-CM | POA: Diagnosis not present

## 2022-05-18 LAB — RAD ONC ARIA SESSION SUMMARY
Course Elapsed Days: 12
Plan Fractions Treated to Date: 9
Plan Prescribed Dose Per Fraction: 3 Gy
Plan Total Fractions Prescribed: 10
Plan Total Prescribed Dose: 30 Gy
Reference Point Dosage Given to Date: 27 Gy
Reference Point Session Dosage Given: 3 Gy
Session Number: 9

## 2022-05-18 MED ORDER — FUROSEMIDE 20 MG PO TABS: 20.0000 mg | ORAL_TABLET | Freq: Every day | ORAL | 1 refills | Status: DC

## 2022-05-19 ENCOUNTER — Inpatient Hospital Stay: Payer: Medicare Other | Admitting: Nurse Practitioner

## 2022-05-19 ENCOUNTER — Other Ambulatory Visit: Payer: Self-pay

## 2022-05-19 ENCOUNTER — Encounter: Payer: Self-pay | Admitting: Radiation Oncology

## 2022-05-19 ENCOUNTER — Ambulatory Visit
Admission: RE | Admit: 2022-05-19 | Discharge: 2022-05-19 | Disposition: A | Discharge status: Home / Self Care | Payer: Medicare Other | Source: Ambulatory Visit | Attending: Radiation Oncology | Admitting: Radiation Oncology

## 2022-05-19 DIAGNOSIS — C3432 Malignant neoplasm of lower lobe, left bronchus or lung: Secondary | ICD-10-CM | POA: Diagnosis not present

## 2022-05-19 DIAGNOSIS — F17210 Nicotine dependence, cigarettes, uncomplicated: Secondary | ICD-10-CM | POA: Diagnosis not present

## 2022-05-19 DIAGNOSIS — C7951 Secondary malignant neoplasm of bone: Secondary | ICD-10-CM | POA: Diagnosis not present

## 2022-05-19 DIAGNOSIS — Z510 Encounter for antineoplastic radiation therapy: Secondary | ICD-10-CM | POA: Diagnosis not present

## 2022-05-19 LAB — RAD ONC ARIA SESSION SUMMARY
Course Elapsed Days: 13
Plan Fractions Treated to Date: 10
Plan Prescribed Dose Per Fraction: 3 Gy
Plan Total Fractions Prescribed: 10
Plan Total Prescribed Dose: 30 Gy
Reference Point Dosage Given to Date: 30 Gy
Reference Point Session Dosage Given: 3 Gy
Session Number: 10

## 2022-05-20 ENCOUNTER — Ambulatory Visit: Payer: Medicare Other

## 2022-05-20 ENCOUNTER — Inpatient Hospital Stay: Payer: Medicare Other | Admitting: Nurse Practitioner

## 2022-05-20 NOTE — Progress Notes (Signed)
This palliative RN called the pt this AM to check in and see how his pain was doing. Pt reported his pain was well managed and his sleep was also improved. Pt reports taking percocet every 3.5-4 hours and the xtampza every 12. Pt also takes lasix per Dr.Kinard for his leg swelling. Per Lexine Baton, NP, pt to take 2 xtampza (18mg  total) every 12 hours. Pt verbalized understanding of how to take medications and the importance of taking medications as prescribed. No further questions or concerns.

## 2022-05-21 ENCOUNTER — Telehealth: Payer: Self-pay

## 2022-05-21 NOTE — Telephone Encounter (Signed)
Received a call from patient stating that he continues to have edema to bilateral lower extremities after completing 3 doses of furosemide. Patient to continue to elevate lower extremities and call back on 11/20 if edema persists.

## 2022-05-24 ENCOUNTER — Ambulatory Visit: Payer: Medicare Other

## 2022-05-24 ENCOUNTER — Telehealth: Payer: Self-pay

## 2022-05-24 NOTE — Telephone Encounter (Signed)
Called patient to ask if edema to bilateral lower extremities had improved after taking 3 doses of lasix. Per patient he continues to have edema to bilateral lower extremities. Per  Dr. Sondra Come patient to follow up with Dr. Lorie Phenix (cardiologist) or Dr. Aura Dials ( PCP) to mange edema. Patient voiced understanding.

## 2022-05-25 ENCOUNTER — Encounter: Payer: Self-pay | Admitting: Nurse Practitioner

## 2022-05-25 ENCOUNTER — Inpatient Hospital Stay (HOSPITAL_BASED_OUTPATIENT_CLINIC_OR_DEPARTMENT_OTHER): Payer: Medicare Other | Admitting: Nurse Practitioner

## 2022-05-25 DIAGNOSIS — K5903 Drug induced constipation: Secondary | ICD-10-CM | POA: Diagnosis not present

## 2022-05-25 DIAGNOSIS — Z515 Encounter for palliative care: Secondary | ICD-10-CM

## 2022-05-25 DIAGNOSIS — G893 Neoplasm related pain (acute) (chronic): Secondary | ICD-10-CM

## 2022-05-25 DIAGNOSIS — C7951 Secondary malignant neoplasm of bone: Secondary | ICD-10-CM | POA: Diagnosis not present

## 2022-05-25 DIAGNOSIS — C3490 Malignant neoplasm of unspecified part of unspecified bronchus or lung: Secondary | ICD-10-CM

## 2022-05-25 DIAGNOSIS — F17210 Nicotine dependence, cigarettes, uncomplicated: Secondary | ICD-10-CM | POA: Diagnosis not present

## 2022-05-25 DIAGNOSIS — R530 Neoplastic (malignant) related fatigue: Secondary | ICD-10-CM

## 2022-05-25 DIAGNOSIS — M8588 Other specified disorders of bone density and structure, other site: Secondary | ICD-10-CM | POA: Diagnosis not present

## 2022-05-25 MED ORDER — XTAMPZA ER 18 MG PO C12A: 18.0000 mg | EXTENDED_RELEASE_CAPSULE | Freq: Two times a day (BID) | ORAL | 0 refills | Status: DC

## 2022-05-25 MED ORDER — OXYCODONE-ACETAMINOPHEN 10-325 MG PO TABS: 1.0000 | ORAL_TABLET | ORAL | 0 refills | Status: DC | PRN

## 2022-05-25 NOTE — Progress Notes (Signed)
[facility]  [phone] [phone]   Name: Albert Perez Date: 05/25/2022 MRN: [id number]  DOB: 11-27-1950  Patient Care Team: Aura Dials, MD as PCP - General (Family Medicine) Constance Haw, MD as PCP - Electrophysiology (Cardiology) Lorretta Harp, MD as PCP - Cardiology (Cardiology) Gentry Fitz, MD as Consulting Physician (Orthopedic Surgery) [place], [name], NP as Nurse Practitioner (Nurse Practitioner)   I connected with Albert Perez on 05/25/22 at  2:00 PM EST by phone and verified that I am speaking with the correct person using two identifiers.   I discussed the limitations, risks, security and privacy concerns of performing an evaluation and management service by telemedicine and the availability of in-person appointments. I also discussed with the patient that there may be a patient responsible charge related to this service. The patient expressed understanding and agreed to proceed.   Other persons participating in the visit and their role in the encounter: Home    Patient's location: Home   Provider's location: [place] HISTORY: Albert Perez is a 71 y.o. 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 Recent scans  Palliative ask to see for.  Palliative ask to see for symptom management.  SOCIAL HISTORY:     reports that he has been smoking cigarettes. He has a 7.50 pack-year smoking history. He has never used smokeless tobacco. He reports current alcohol use of about 2.0 standard drinks of alcohol per week. He reports current drug use. Drug: Marijuana.  ADVANCE DIRECTIVES:    CODE STATUS:   PAST MEDICAL HISTORY: Past Medical  History:  Diagnosis Date   A-fib [facility])    PMH: 2000-2001   Arthritis    Cancer ([place])    melanoma 2018   Diarrhea    due to medications   DVT (deep venous thrombosis) (HCC)    Left brachial vein DVT (01/30/19, following fracture)   Dysrhythmia    afib -    ETOH abuse    GERD (gastroesophageal reflux disease)    ocassional   History of radiation therapy    Right Hip 02/08/22-02/19/22-Dr. Gery Pray   Lung cancer [facility]) dx'd 02/26/2019   Metastatic cancer to bone ([place]) dx'd 02/26/2019   Sleep apnea    do not wear CPAP   Stroke ([place]) 2019   " mini"   Vertigo    Wears glasses     ALLERGIES:  has No Known Allergies.  MEDICATIONS:  Current Outpatient Medications  Medication Sig Dispense Refill   furosemide (LASIX) 20 MG tablet Take 1 tablet (20 mg total) by mouth daily. 3 tablet 1   buPROPion (WELLBUTRIN XL) 150 MG 24 hr tablet Take 150 mg by mouth daily as needed (nicotine cravings).     carvedilol (COREG) 6.25 MG tablet TAKE 1 TABLET BY MOUTH TWICE A DAY 180 tablet 3   ELIQUIS 5 MG TABS tablet TAKE 1 TABLET BY MOUTH TWICE A DAY 60 tablet 2   loperamide (IMODIUM) 2 MG capsule Take 2 mg by mouth as needed for diarrhea or loose stools. (Patient not taking: Reported on 04/27/2022)     Multiple Vitamin (MULTIVITAMIN PO) Take 1 tablet by mouth daily. Centrum     Omega-3 Fatty Acids (OMEGA-3 PO) Take 1 capsule by mouth daily.  osimertinib mesylate (TAGRISSO) 80 MG tablet Take 1 tablet (80 mg total) by mouth daily. 60 tablet 1   OVER THE COUNTER MEDICATION Take 1 tablet by mouth daily. Standard Process, LivaPlex (Liver supplement)     oxyCODONE ER 9 MG C12A Take 9 mg by mouth every 12 (twelve) hours. 60 capsule 0   oxyCODONE-acetaminophen (PERCOCET) 10-325 MG tablet Take 1 tablet by mouth every 4 (four) hours as needed for pain. 90 tablet 0   rosuvastatin (CRESTOR) 5 MG tablet Take 5 mg by mouth daily.  2   tiZANidine (ZANAFLEX) 2 MG tablet Take 1 tablet (2 mg total) by mouth every 8  (eight) hours as needed for muscle spasms. 30 tablet 1   TURMERIC PO Take 1 capsule by mouth daily.     vitamin B-12 (CYANOCOBALAMIN) 1000 MCG tablet Take 1,000 mcg by mouth daily.     No current facility-administered medications for this visit.    VITAL SIGNS: There were no vitals taken for this visit. There were no vitals filed for this visit.   Estimated body mass index is 25.47 kg/m as calculated from the following:   Height as of 05/11/22: 5\' 7"  (1.702 m).   Weight as of 05/11/22: 162 lb 9.6 oz (73.8 kg).   PERFORMANCE STATUS (ECOG) : 1 - Symptomatic but completely ambulatory  IMPRESSION: I spoke with Albert Perez today for symptom management follow-up. He has completed radiation. No significant changes however does share that he continues to have some lower extremity edema. Prescribed lasix which he took for several days. He is looking forward to celebrating Thanksgiving with his family.   Denies nausea, vomiting, constipation, or diarrhea.   Pain has improved some with recent changes.   We discussed current pain regimen at length. Albert Perez is taking oxycodone IR 10 mg every 4 hours as needed for pain. Tolerating Xtampza 18mg . Pain is more intense based on level of activity.   We will continue to closely monitor and adjust as needed.   PLAN: Oxycodone 10 mg every 4-6 hours as needed for breakthrough pain Xtampza 18 mg every 12 hours  Miralax daily for bowel regimen  We will continue to closely monitor and adjust as needed. I will plan to see patient back in 3-4 weeks in collaboration with his other oncology/radiation appointments.  He knows to contact our office sooner if needed.   Patient expressed understanding and was in agreement with this plan. He also understands that He can call the clinic at any time with any questions, concerns, or complaints.    Any controlled substances utilized were prescribed in the context of palliative care. PDMP has been reviewed.   Time  Total: 40 min   Visit consisted of counseling and education dealing with the complex and emotionally intense issues of symptom management and palliative care in the setting of serious and potentially life-threatening illness.Greater than 50%  of this time was spent counseling and coordinating care related to the above assessment and plan.  Alda Lea, AGPCNP-BC  Palliative Medicine Team/[facility]

## 2022-05-26 ENCOUNTER — Other Ambulatory Visit: Payer: Self-pay

## 2022-05-26 ENCOUNTER — Telehealth: Payer: Self-pay | Admitting: Cardiovascular Disease

## 2022-05-26 ENCOUNTER — Other Ambulatory Visit: Payer: Self-pay | Admitting: Medical Oncology

## 2022-05-26 ENCOUNTER — Telehealth: Payer: Self-pay

## 2022-05-26 ENCOUNTER — Other Ambulatory Visit: Payer: Self-pay | Admitting: Physician Assistant

## 2022-05-26 DIAGNOSIS — I2692 Saddle embolus of pulmonary artery without acute cor pulmonale: Secondary | ICD-10-CM

## 2022-05-26 DIAGNOSIS — M7989 Other specified soft tissue disorders: Secondary | ICD-10-CM

## 2022-05-26 MED ORDER — FUROSEMIDE 20 MG PO TABS: 20.0000 mg | ORAL_TABLET | Freq: Every day | ORAL | 1 refills | Status: DC

## 2022-05-26 NOTE — Telephone Encounter (Signed)
Called patient, he states that he is having some swelling- he has seen his PCP and they advised he was having bilateral pitting edema. (He states swelling in legs/ankles) he advised that he does not see a difference if he elevates his legs from morning to afternoon. He states he does not have any lasix 20 mg (they only sent in 3 tablets, 1 refill) he states they told him not to take any. I advised I would send a message to advise if okay to start Lasix back. He does have weight gain- current 170 lb-  163 lb is his normal.  Patient denies SOB or any other symptoms other than leg swelling.   Will route to MD.

## 2022-05-26 NOTE — Telephone Encounter (Signed)
Tagrisso shipment send 05/25/22-tracking number [id number].

## 2022-05-26 NOTE — Telephone Encounter (Signed)
Sent over RX for Lasix 20 mg daily (30 tabs 1 refill) patient has BMET placed aware to have completed in 10-14 days. Also made app appointment on 12/14 with Almyra Deforest, PA-C.   Patient aware of all of this, will update with any changes.

## 2022-05-26 NOTE — Telephone Encounter (Signed)
Patient is returning call.  °

## 2022-05-26 NOTE — Telephone Encounter (Signed)
Pt c/o swelling: STAT is pt has developed SOB within 24 hours  If swelling, where is the swelling located? Legs and feet  How much weight have you gained and in what time span? No  Have you gained 3 pounds in a day or 5 pounds in a week? NA  Do you have a log of your daily weights (if so, list)? No  Are you currently taking a fluid pill? Yes  Are you currently SOB? No  Have you traveled recently? No   Pt states that PCP told him that he has Pitting Edema. Pt would like a callback regarding this matter. Please advise

## 2022-05-26 NOTE — Telephone Encounter (Signed)
Called patient to discuss swelling.   Left call back number.

## 2022-05-26 NOTE — Telephone Encounter (Signed)
Pt called pharmacy reporting he was not able pick up pain meds, RN called pharmacy and confirmed that pt will be able to pick up meds today, pt notified, no further needs at this time.

## 2022-05-31 ENCOUNTER — Telehealth: Payer: Self-pay | Admitting: Cardiovascular Disease

## 2022-05-31 ENCOUNTER — Telehealth: Payer: Self-pay | Admitting: Physician Assistant

## 2022-05-31 NOTE — Telephone Encounter (Signed)
Called patient regarding upcoming December appointments, patient is notified. 

## 2022-05-31 NOTE — Telephone Encounter (Addendum)
Spoke with wife and patient.  Patient still has pitting edema in lower extremities. Now, according to wife, patient's thighs have "ballooned". Denies weeping in legs. She noticed this yesterday, but doesn't know how long his thighs have been edematous. His thighs touch now, where they never did before. Denies SOB. Did not sound SOB over phone. Taking lasix 20mg  for past 5 days. While on phone, BP 145/53, P 61 and 131/56. He voids, but no change in edema. Made appointment with Melvenia Needles for 11/29.

## 2022-05-31 NOTE — Telephone Encounter (Signed)
Pt c/o medication issue:  1. Name of Medication:   furosemide (LASIX) 20 MG table    2. How are you currently taking this medication (dosage and times per day)? Take 1 tablet (20 mg total) by mouth daily.   3. Are you having a reaction (difficulty breathing--STAT)? No  4. What is your medication issue? Spouse would like to know about how long does it take for medication to start working. Please advise

## 2022-06-01 NOTE — Progress Notes (Unsigned)
Cardiology Office Note:    Date:  06/02/2022   ID:  Albert Perez, DOB 06-07-1951, MRN [id number]  PCP:  Aura Dials, [place] Cardiologist: Quay Burow, MD   Reason for visit: LE edema  History of Present Illness:    Albert Perez is a 71 y.o. male with a hx of tobacco use, hyperlipidemia, lung cancer, CVA, sleep apnea intolerant to CPAP, paroxysmal atrial fibrillation 2000 - was thought due to stress, caffeine, and alcohol.  SVT: Found in the emergency room 06/04/2019 -- Due to the coronavirus pandemic, he was not ablated.  Echo 2019 with EF 55-60%.  He was last seen by Dr. Gwenlyn Found in December 2021.  Noted that palpitations were fairly infrequent and self-limited.  Today, patient comes in with his wife.  They state patient finished his last radiation treatment for a mass in his right hip 3 weeks ago.  Since then, they have noticed increased LE edema which has not been a problem for him in the past.  There is some redness above the right ankle.  He contacted our office and was started on Lasix 20 mg daily 1 week ago.  They have not seen improvement.  His dry weight is 163 pounds and he is up to 175 pounds.  He also mentions that he was started on a new shot to strengthen his bones called Xgeva.    Otherwise, patient denies shortness of breath, orthopnea, PND, abdominal bloating.  He has had some lightheadedness with position changes since being on Lasix.  He has occasional palpitations that are brief and related to stress.  He denies bleeding issues with Eliquis.    Past Medical History:  Diagnosis Date   A-fib [facility])    PMH: 2000-2001   Arthritis    Cancer ([place])    melanoma 2018   Diarrhea    due to medications   DVT (deep venous thrombosis) (HCC)    Left brachial vein DVT (01/30/19, following fracture)   Dysrhythmia    afib -    ETOH abuse    GERD (gastroesophageal reflux disease)    ocassional   History of radiation therapy    Right Hip  02/08/22-02/19/22-Dr. Gery Pray   Lung cancer [facility]) dx'd 02/26/2019   Metastatic cancer to bone ([place]) dx'd 02/26/2019   Sleep apnea    do not wear CPAP   Stroke ([place]) 2019   " mini"   Vertigo    Wears glasses     Past Surgical History:  Procedure Laterality Date   ANKLE FRACTURE SURGERY Right 2003   COLONOSCOPY  2018   ELBOW SURGERY Left 1964   INGUINAL HERNIA REPAIR Bilateral 11/12/2020   Procedure: BILATERAL OPEN INGUINAL HERNIA REPAIR WITH MESH;  Surgeon: Coralie Keens, MD;  Location: [facility];  Service: General;  Laterality: Bilateral;   melanoma removal  04/2017   Lt shoulder   RETINAL DETACHMENT SURGERY Right 1971   TONSILLECTOMY     VIDEO BRONCHOSCOPY WITH ENDOBRONCHIAL NAVIGATION N/A 03/28/2019   Procedure: VIDEO BRONCHOSCOPY WITH ENDOBRONCHIAL NAVIGATION and Biopsies;  Surgeon: Collene Gobble, MD;  Location: [facility] OR;  Service: Thoracic;  Laterality: N/A;    Current Medications: Current Meds  Medication Sig   buPROPion (WELLBUTRIN XL) 150 MG 24 hr tablet Take 150 mg by mouth daily as needed (nicotine cravings).   carvedilol (COREG) 6.25 MG tablet TAKE 1 TABLET BY MOUTH TWICE A DAY   Cholecalciferol (VITAMIN D-3) 25 MCG (1000 UT) CAPS Take 1,000 Units by  mouth daily.   ELIQUIS 5 MG TABS tablet TAKE 1 TABLET BY MOUTH TWICE A DAY   loperamide (IMODIUM) 2 MG capsule Take 2 mg by mouth as needed for diarrhea or loose stools.   Magnesium 500 MG CAPS Take 1,000 mg by mouth daily.   Multiple Vitamin (MULTIVITAMIN PO) Take 1 tablet by mouth daily. Centrum   osimertinib mesylate (TAGRISSO) 80 MG tablet Take 1 tablet (80 mg total) by mouth daily.   OVER THE COUNTER MEDICATION Take 1 tablet by mouth daily. Standard Process, LivaPlex (Liver supplement)   oxyCODONE ER (XTAMPZA ER) 18 MG C12A Take 18 mg by mouth every 12 (twelve) hours.   oxyCODONE ER 9 MG C12A Take 9 mg by mouth every 12 (twelve) hours.   oxyCODONE-acetaminophen (PERCOCET) 10-325 MG tablet Take 1 tablet by mouth  every 4 (four) hours as needed for pain.   rosuvastatin (CRESTOR) 5 MG tablet Take 5 mg by mouth daily.   torsemide (DEMADEX) 20 MG tablet Take 1 tablet (20 mg total) by mouth 2 (two) times daily.   TURMERIC PO Take 1 capsule by mouth daily.   vitamin B-12 (CYANOCOBALAMIN) 1000 MCG tablet Take 1,000 mcg by mouth daily.   [DISCONTINUED] furosemide (LASIX) 20 MG tablet Take 1 tablet (20 mg total) by mouth daily.   [DISCONTINUED] Omega-3 Fatty Acids (OMEGA-3 PO) Take 1 capsule by mouth daily.   [DISCONTINUED] tiZANidine (ZANAFLEX) 2 MG tablet Take 1 tablet (2 mg total) by mouth every 8 (eight) hours as needed for muscle spasms.     Allergies:   Patient has no known allergies.   Social History   Socioeconomic History   Marital status: Married    Spouse name: Not on file   Number of children: 2   Years of education: 14   Highest education level: Some college, no degree  Occupational History   Not on file  Tobacco Use   Smoking status: Every Day    Packs/day: 0.25    Years: 30.00    Total pack years: 7.50    Types: Cigarettes   Smokeless tobacco: Never   Tobacco comments:    04/27/21: Per pt, smokes 3 cigs a day now.  Vaping Use   Vaping Use: Former  Substance and Sexual Activity   Alcohol use: Yes    Alcohol/week: 2.0 standard drinks of alcohol    Types: 2 Standard drinks or equivalent per week   Drug use: Yes    Types: Marijuana    Comment: ocassionally- last time was 11/07/20   Sexual activity: Not on file  Other Topics Concern   Not on file  Social History Narrative   Lives at home with wife, daughter and grandson   Right handed   Caffeine: sodas 2 per day   Social Determinants of Health   Financial Resource Strain: Not on file  Food Insecurity: Not on file  Transportation Needs: Not on file  Physical Activity: Not on file  Stress: Not on file  Social Connections: Not on file     Family History: The patient's family history includes Aneurysm in his mother; Colon  cancer in his brother; Prostate cancer in his brother.  ROS:   Please see the history of present illness.     EKGs/Labs/Other Studies Reviewed:    EKG:  The ekg ordered today demonstrates sinus rhythm with PACs, heart rate 64.  Recent Labs: 04/26/2022: ALT 13; BUN 21; Creatinine 1.20; Hemoglobin 12.2; Platelet Count 209; Potassium 4.7; Sodium 138   Recent  Lipid Panel No results found for: "CHOL", "TRIG", "HDL", "LDLCALC", "LDLDIRECT"  Physical Exam:    VS:  BP (!) 132/58 (BP Location: Left Arm, Patient Position: Sitting, Cuff Size: Normal)   Pulse 64   Ht 5' 7.5" (1.715 m)   Wt 175 lb (79.4 kg)   BMI 27.00 kg/m    No data found.       Wt Readings from Last 3 Encounters:  06/02/22 175 lb (79.4 kg)  05/11/22 162 lb 9.6 oz (73.8 kg)  04/28/22 165 lb 9.6 oz (75.1 kg)     GEN: Well nourished, well developed in no acute distress, using walker HEENT: Normal NECK: JVD to mid neck at 90 degrees; No carotid bruits CARDIAC: RRR, no murmurs, rubs, gallops RESPIRATORY:  Clear to auscultation without rales, wheezing or rhonchi  ABDOMEN: Soft, non-tender, non-distended MUSCULOSKELETAL: 2+ LE edema to thighs bilaterally, redness above R anke SKIN: Warm and dry NEUROLOGIC:  Alert and oriented PSYCHIATRIC:  Normal affect     ASSESSMENT AND PLAN   Acute diastolic heart failure Lower extremity edema -2D echo 2019 with EF 55 to 60%. -->  Repeat echo -Stop Lasix. -Start torsemide 20 mg daily. -He has standing labs in 2 weeks with oncology. -If no improvement LE edema in 5 days, please contact our office.  Otherwise keep follow-up in 2 weeks. -Recommend salt restriction as already doing. -Recommend discuss LE edema with oncology to ensure not a side effect of treatment. -Outline redness in right lower leg and follow-up with PCP for possible cellulitis.  Hx of paroxysmal atrial fibrillation and SVT -Continue carvedilol -Continue Eliquis -no bleeding issues  Elevated coronary  artery calcium score -Coronary calcium score 640 in 2020 -Recommend check fasting lipids -Continue Crestor.  Tobacco use, seldom [place] now -Recommend tobacco cessation.    Disposition - Follow-up as scheduled on 12/14 with [place].   Medication Adjustments/Labs and Tests Ordered: Current medicines are reviewed at length with the patient today.  Concerns regarding medicines are outlined above.  Orders Placed This Encounter  Procedures   Lipid panel   EKG 12-Lead   ECHOCARDIOGRAM COMPLETE   Meds ordered this encounter  Medications   torsemide (DEMADEX) 20 MG tablet    Sig: Take 1 tablet (20 mg total) by mouth 2 (two) times daily.    Dispense:  90 tablet    Refill:  1    Patient Instructions  Medication Instructions:  Stop Lasix. Start Torsemide 20 mg ( Take 1 Tablet Daily). *If you need a refill on your cardiac medications before your next appointment, please call your pharmacy*   Lab Work: Lipid Panel  If you have labs (blood work) drawn today and your tests are completely normal, you will receive your results only by: [place] (if you have MyChart) OR A paper copy in the mail If you have any lab test that is abnormal or we need to change your treatment, we will call you to review the results.   Testing/Procedures: [address], [address]. Your physician has requested that you have an echocardiogram. Echocardiography is a painless test that uses sound waves to create images of your heart. It provides your doctor with information about the size and shape of your heart and how well your heart's chambers and valves are working. This procedure takes approximately one hour. There are no restrictions for this procedure. Please do NOT wear cologne, perfume, aftershave, or lotions (deodorant is allowed). Please arrive 15 minutes prior to your appointment time.  Follow-Up: At [facility], you and your health needs are our priority.  As part of our  continuing mission to provide you with exceptional heart care, we have created designated Provider Care Teams.  These Care Teams include your primary Cardiologist (physician) and Advanced Practice Providers (APPs -  Physician Assistants and Nurse Practitioners) who all work together to provide you with the care you need, when you need it.  We recommend signing up for the patient portal called "MyChart".  Sign up information is provided on this After Visit Summary.  MyChart is used to connect with patients for Virtual Visits (Telemedicine).  Patients are able to view lab/test results, encounter notes, upcoming appointments, etc.  Non-urgent messages can be sent to your provider as well.   To learn more about what you can do with MyChart, go to [email or web address].    Your next appointment:   1 month(s)  The format for your next appointment:   In Person  Provider:   Caron Presume, PA-C      Other Instructions Swelling not Improved in 5 Days Call office. Outline redness on Right Leg with Sharpie and follow up with Primary Care Provider to ensure no infection.    Signed, Warren Lacy, PA-C  06/02/2022 10:05 AM    [place]

## 2022-06-02 ENCOUNTER — Encounter: Payer: Self-pay | Admitting: Radiation Oncology

## 2022-06-02 ENCOUNTER — Ambulatory Visit: Payer: Medicare Other | Attending: Physician Assistant | Admitting: Physician Assistant

## 2022-06-02 ENCOUNTER — Encounter: Payer: Self-pay | Admitting: Physician Assistant

## 2022-06-02 VITALS — BP 132/58 | HR 64 | Ht 67.5 in | Wt 175.0 lb

## 2022-06-02 DIAGNOSIS — I5031 Acute diastolic (congestive) heart failure: Secondary | ICD-10-CM | POA: Diagnosis not present

## 2022-06-02 DIAGNOSIS — M7989 Other specified soft tissue disorders: Secondary | ICD-10-CM

## 2022-06-02 DIAGNOSIS — E782 Mixed hyperlipidemia: Secondary | ICD-10-CM | POA: Diagnosis not present

## 2022-06-02 DIAGNOSIS — R931 Abnormal findings on diagnostic imaging of heart and coronary circulation: Secondary | ICD-10-CM | POA: Diagnosis not present

## 2022-06-02 DIAGNOSIS — I10 Essential (primary) hypertension: Secondary | ICD-10-CM | POA: Diagnosis not present

## 2022-06-02 MED ORDER — TORSEMIDE 20 MG PO TABS: 20.0000 mg | ORAL_TABLET | Freq: Two times a day (BID) | ORAL | 1 refills | Status: DC

## 2022-06-02 NOTE — Patient Instructions (Signed)
Medication Instructions:  Stop Lasix. Start Torsemide 20 mg ( Take 1 Tablet Daily). *If you need a refill on your cardiac medications before your next appointment, please call your pharmacy*   Lab Work: Lipid Panel  If you have labs (blood work) drawn today and your tests are completely normal, you will receive your results only by: [place] (if you have MyChart) OR A paper copy in the mail If you have any lab test that is abnormal or we need to change your treatment, we will call you to review the results.   Testing/Procedures: [address], [address]. Your physician has requested that you have an echocardiogram. Echocardiography is a painless test that uses sound waves to create images of your heart. It provides your doctor with information about the size and shape of your heart and how well your heart's chambers and valves are working. This procedure takes approximately one hour. There are no restrictions for this procedure. Please do NOT wear cologne, perfume, aftershave, or lotions (deodorant is allowed). Please arrive 15 minutes prior to your appointment time.    Follow-Up: At [facility], you and your health needs are our priority.  As part of our continuing mission to provide you with exceptional heart care, we have created designated Provider Care Teams.  These Care Teams include your primary Cardiologist (physician) and Advanced Practice Providers (APPs -  Physician Assistants and Nurse Practitioners) who all work together to provide you with the care you need, when you need it.  We recommend signing up for the patient portal called "MyChart".  Sign up information is provided on this After Visit Summary.  MyChart is used to connect with patients for Virtual Visits (Telemedicine).  Patients are able to view lab/test results, encounter notes, upcoming appointments, etc.  Non-urgent messages can be sent to your provider as well.   To learn more about what  you can do with MyChart, go to [email or web address].    Your next appointment:   1 month(s)  The format for your next appointment:   In Person  Provider:   Caron Presume, PA-C      Other Instructions Swelling not Improved in 5 Days Call office. Outline redness on Right Leg with Sharpie and follow up with Primary Care Provider to ensure no infection.

## 2022-06-03 ENCOUNTER — Telehealth: Payer: Self-pay

## 2022-06-03 ENCOUNTER — Other Ambulatory Visit: Payer: Self-pay

## 2022-06-03 ENCOUNTER — Other Ambulatory Visit: Payer: Self-pay | Admitting: Nurse Practitioner

## 2022-06-03 DIAGNOSIS — C3490 Malignant neoplasm of unspecified part of unspecified bronchus or lung: Secondary | ICD-10-CM

## 2022-06-03 DIAGNOSIS — G893 Neoplasm related pain (acute) (chronic): Secondary | ICD-10-CM

## 2022-06-03 DIAGNOSIS — Z515 Encounter for palliative care: Secondary | ICD-10-CM

## 2022-06-03 MED ORDER — OXYCODONE-ACETAMINOPHEN 10-325 MG PO TABS: 1.0000 | ORAL_TABLET | ORAL | 0 refills | Status: DC | PRN

## 2022-06-03 NOTE — Telephone Encounter (Signed)
Pt wife called reporting pt having increased pain. Upon assessment pt was only taking his percocet and discontinued MS Contin. Pt wife reported that he had not picked up his xtampza (18mg ) which was prescribed the previous week and therefore had not been taking it. Pt and wife were educated on the need to take xtampza as ordered and to discontinue the old medications, as previously discussed. Pt also reported chewing his percocet's to "just get some quick relief" pt and wife were educated not to chew or dissolve medication, to swallow it whole and to take as prescribed. Pt wife voiced understanding and will go pick up the xtampza. Verbalized understanding to call with any further questions or concerns.

## 2022-06-07 NOTE — Telephone Encounter (Signed)
Spoke with patient who reported the swelling in both legs from thigh down is worse with associated pain. The left leg is larger than the right. He is not certain if it if from radiation [place], and stated his oncologist suggested he contact cardiology. There is no weeping in legs. There is a rash above each ankle; left more than right. The appointment that was made for [facility] was cancelled by patient. Made appointment with Virgie Dad for 12/7. Spoke with spouse who was concerned that patient keeps seeing other practitioners and not Dr. Gwenlyn Found. Explained that Dr. Gwenlyn Found is informed of the care patient is receiving. She stated patient will be at 4/7 appointment.

## 2022-06-07 NOTE — Telephone Encounter (Signed)
Wife called back to say there has been no change. Please advise

## 2022-06-08 ENCOUNTER — Telehealth: Payer: Self-pay

## 2022-06-08 NOTE — Telephone Encounter (Signed)
Pt called for a refill, refill sent in to pharmacy, pt reports doing well and has no further concerns at this time.

## 2022-06-08 NOTE — Progress Notes (Unsigned)
Cardiology Office Note:    Date:  06/10/2022   ID:  Albert Perez, DOB 1951-01-08, MRN [id number]  PCP:  Aura Dials, MD  Cardiologist:  Quay Burow, MD  Electrophysiologist:  Constance Haw, MD   Referring MD: Aura Dials, MD   Chief Complaint: follow-up of lower extremity edema   History of Present Illness:    Albert Perez is a 71 y.o. 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 Gwenlyn Found and presents today for follow-up of lower extremity edema.    Patient was referred to Dr. Gwenlyn Found in 06/2019 for further evaluation of tachypalpitaitons after recent ED visit for SVT vs atrial flutter with 2-1 block (converted spontaneously with Valsalva). He also reported a brief episode of atrial fibrillation back in 2000 which he related to stress, alcohol, and caffein. At that time, he was having intermittent palpitations on a monthly basis. Event monitor was ordered and showed underling sinus rhythm wit runs of paroxysmal atrial fibrillation, SVT, and NSVT. He was started on Coreg and Eliquis. Coronary calcium was also ordered at initial visit with Dr. Gwenlyn Found in 06/2019 and came back at 70 (79th percentile for age and sex). Prior Echo in 11/2017 showed LVEF of 55-60% with no regional wall motion abnormalities and grade 1 diastolic dysfunction.   Patient has recently been struggling with lower extremity edema. 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 He was seen by Caron Presume, PA-C, on 06/02/2022  at which time he reported continued lower extremity edema despite recently starting Lasix and his weight was up from 163 lbs to 175 lbs. He was noted to have 2+ edema of bilateral lower extremities up to his thighs. Repeat Echo was ordered and Lasix was started and he was started on Torsemide instead.  Patient presents today for follow-up. Here with wife.  Patient continues to have bilateral lower extremity edema but he does note that this has improved since switching to torsemide.  His weight is also down from 175 lbs at last visit 270 lbs today.  He also reports that some of his bilateral leg pain has improved as edema has improved.  He has no other signs or symptoms of CHF.  No shortness of breath, orthopnea, PND.  He denies any chest pain.  He describes some brief episodes of palpitations but nothing significant.  He also describes some mild dizziness with standing quickly which sounds like orthostasis.  He had 1 event a couple of days ago where he had sharp shooting pain of his right hip and stood up quickly and got very dizzy with this and fell back on the couch. He did not injury himself with this. However, it sounds like this was more of an isolated event.  He had no other falls and no syncope.  He is compliant with his Eliquis and has not missed any doses of this.  He continues to have an area of erythema above his right ankle but it is improving.  The area of erythema above his left ankle has essentially  resolved.  Past Medical History:  Diagnosis Date   A-fib [facility])    PMH: 2000-2001   Arthritis    Cancer ([place])    melanoma 2018   Diarrhea    due to medications   DVT (deep venous thrombosis) (HCC)    Left brachial vein DVT (01/30/19, following fracture)   Dysrhythmia    afib -    ETOH abuse    GERD (gastroesophageal reflux disease)    ocassional   History of radiation therapy    Right Hip 02/08/22-02/19/22-Dr. Gery Pray   Lung cancer [facility]) dx'd 02/26/2019   Metastatic cancer to bone  ([place]) dx'd 02/26/2019   Sleep apnea    do not wear CPAP   Stroke ([place]) 2019   " mini"   Vertigo    Wears glasses     Past Surgical History:  Procedure Laterality Date   ANKLE FRACTURE SURGERY Right 2003   COLONOSCOPY  2018   ELBOW SURGERY Left 1964   INGUINAL HERNIA REPAIR Bilateral 11/12/2020   Procedure: BILATERAL OPEN INGUINAL HERNIA REPAIR WITH MESH;  Surgeon: Coralie Keens, MD;  Location: [place];  Service: General;  Laterality: Bilateral;   melanoma removal  04/2017   Lt shoulder   RETINAL DETACHMENT SURGERY Right 1971   TONSILLECTOMY     VIDEO BRONCHOSCOPY WITH ENDOBRONCHIAL NAVIGATION N/A 03/28/2019   Procedure: VIDEO BRONCHOSCOPY WITH ENDOBRONCHIAL NAVIGATION and Biopsies;  Surgeon: Collene Gobble, MD;  Location: [facility] OR;  Service: Thoracic;  Laterality: N/A;    Current Medications: Current Meds  Medication Sig   buPROPion (WELLBUTRIN XL) 150 MG 24 hr tablet Take 150 mg by mouth daily as needed (nicotine cravings).   carvedilol (COREG) 6.25 MG tablet TAKE 1 TABLET BY MOUTH TWICE A DAY   Cholecalciferol (VITAMIN D-3) 25 MCG (1000 UT) CAPS Take 1,000 Units by mouth daily.   ELIQUIS 5 MG TABS tablet TAKE 1 TABLET BY MOUTH TWICE A DAY   loperamide (IMODIUM) 2 MG capsule Take 2 mg by mouth as needed for diarrhea or loose stools.   Magnesium 500 MG CAPS Take 1,000 mg by mouth daily.   Multiple Vitamin (MULTIVITAMIN PO) Take 1 tablet by mouth daily. Centrum   osimertinib mesylate (TAGRISSO) 80 MG tablet Take 1 tablet (80 mg total) by mouth daily.   OVER THE COUNTER MEDICATION Take 1 tablet by mouth daily. Standard Process, LivaPlex (Liver supplement)   oxyCODONE ER (XTAMPZA ER) 18 MG C12A Take 18 mg by mouth every 12 (twelve) hours.   oxyCODONE-acetaminophen (PERCOCET) 10-325 MG tablet Take 1 tablet by mouth every 4 (four) hours as needed for pain.   rosuvastatin (CRESTOR) 5 MG tablet Take 5 mg by mouth daily.   torsemide (DEMADEX) 20 MG tablet Take 1 tablet (20 mg total) by  mouth 2 (two) times daily.   TURMERIC PO Take 1 capsule by mouth daily.   vitamin B-12 (CYANOCOBALAMIN) 1000 MCG tablet Take 1,000 mcg by mouth daily.     Allergies:   Patient has no known allergies.   Social History   Socioeconomic History   Marital status: Married    Spouse name: Not on file   Number of children: 2   Years of education: 14   Highest education level: Some college, no degree  Occupational History   Not on file  Tobacco Use   Smoking status: Every Day    Packs/day: 0.25    Years: 30.00    Total pack years: 7.50    Types: Cigarettes  Smokeless tobacco: Never   Tobacco comments:    04/27/21: Per pt, smokes 3 cigs a day now.  Vaping Use   Vaping Use: Former  Substance and Sexual Activity   Alcohol use: Yes    Alcohol/week: 2.0 standard drinks of alcohol    Types: 2 Standard drinks or equivalent per week   Drug use: Yes    Types: Marijuana    Comment: ocassionally- last time was 11/07/20   Sexual activity: Not on file  Other Topics Concern   Not on file  Social History Narrative   Lives at home with wife, daughter and grandson   Right handed   Caffeine: sodas 2 per day   Social Determinants of Health   Financial Resource Strain: Not on file  Food Insecurity: Not on file  Transportation Needs: Not on file  Physical Activity: Not on file  Stress: Not on file  Social Connections: Not on file     Family History: The patient's family history includes Aneurysm in his mother; Colon cancer in his brother; Prostate cancer in his brother.  ROS:   Please see the history of present illness.     EKGs/Labs/Other Studies Reviewed:    The following studies were reviewed:  Echocardiogram 11/16/2017: Study Conclusions: - Left ventricle: The cavity size was normal. Wall thickness was    normal. Systolic function was normal. The estimated ejection    fraction was in the range of 55% to 60%. Wall motion was normal;    there were no regional wall motion  abnormalities. Doppler    parameters are consistent with abnormal left ventricular    relaxation (grade 1 diastolic dysfunction).  - Right atrium: The atrium was mildly dilated.   _______________  CT Cardiac Scoring 06/25/2019: Impressions: Coronary calcium score of 640. This was 63 percentile for age and sex matched control. _______________  Monitor 06/24/2019 to 07/25/2019: 1: Sinus rhythm/sinus tachycardia/sinus bradycardia (rates in the low 40s) 2: Runs of SVT and nonsustained ventricular tachycardia. 3: Runs of PAF 4: Needs return office visit this week to discuss   EKG:  EKG not ordered today. .  Recent Labs: 04/26/2022: ALT 13; BUN 21; Creatinine 1.20; Hemoglobin 12.2; Platelet Count 209; Potassium 4.7; Sodium 138  Recent Lipid Panel No results found for: "CHOL", "TRIG", "HDL", "CHOLHDL", "VLDL", "LDLCALC", "LDLDIRECT"  Physical Exam:    Vital Signs: BP (!) 112/53   Pulse 64   Ht 5\' 7"  (1.702 m)   Wt 170 lb 6.4 oz (77.3 kg)   SpO2 96%   BMI 26.69 kg/m     Wt Readings from Last 3 Encounters:  06/10/22 170 lb 6.4 oz (77.3 kg)  06/02/22 175 lb (79.4 kg)  05/11/22 162 lb 9.6 oz (73.8 kg)     General: 71 y.o. Caucasian male in no acute distress. HEENT: Normocephalic and atraumatic. Sclera clear.  Neck: Supple. No JVD. Heart: RRR. Soft II/IV systolic murmur. No gallops or rubs.  Lungs: No increased work of breathing. Clear to ausculation bilaterally. No wheezes, rhonchi, or rales.  Abdomen: Soft, non-distended, and non-tender to palpation.  Extremities: 2-3+ pitting edema extending up to knees bilaterally. Area of erythema above right ankle - not warm to the touch. Skin: Warm and dry. Neuro: Alert and oriented x3. No focal deficits. Psych: Normal affect. Responds appropriately.  Assessment:    1. Lower extremity edema   2. Possible chronic diastolic CHF   3. Coronary artery disease involving native coronary artery of native heart without angina pectoris  4.  Paroxysmal atrial fibrillation (HCC)   5. Paroxysmal SVT (supraventricular tachycardia)   6. NSVT (nonsustained ventricular tachycardia) (HCC)     Plan:    Lower Extremity Edema Possible Chronic Diastolic CHF vs. Chronic Venous Insufficiency Patient has worsening lower extremity edema since completing radiation of his hip about 1 month ago. He was switched from Lasix to Torsemide at office visit last week.  Last Echo in 2019 showed LVEF of 55-60% with grade 1 diastolic dysfunction.  - He still has significant lower extremity edema but patient and wife state it has improved since switching to Torsemide. Weight also improving. - Continue Torsemide 20mg  twice daily. - Echo pending. - Will check BNP and BMET today.  - Continue limiting sodium intake and compressions stockings. - Edema may be from diastolic CHF but has no other signs of CHF. Will check BNP and Echo as above. Also could be due to chronic venous insufficiency. Low suspicion for DVT given no asymmetry and he is compliant with Eliquis. Recommend continuing to follow-up with Oncology to make sure this is not a side effect of treatment.  Of note, he does have an area of erythema above his right ankle but wife states this is improving. It is not warm to the touch. He had a similar area above his left ankle which has essentially resolved. Advised patient and wife to continue to keep a close eye on this. Recommended following up with PCP for possible cellulitis if it does not improve or worsens.  CAD Coronary calcium score was 640 (79th percentile for age and sex) in 06/2019. - No chest pain. - No aspirin due to need for Eliquis. - Continue statin.  Paroxysmal Atrial Fibrillation Paroxysmal SVT and NSVT Monitor in 06/2019 showed underlying sinus rhythm with runs of paroxysmal atrial fibrillation, SVT, and NSVT.  - Maintaining sinus rhythm on exam.  - No significant palpitations.  - Continue 6.25mg  twice daily. - Continue Eliquis 5mg   twice daily.  Disposition: Keep follow-up with Caron Presume, PA-C, next month.   Medication Adjustments/Labs and Tests Ordered: Current medicines are reviewed at length with the patient today.  Concerns regarding medicines are outlined above.  Orders Placed This Encounter  Procedures   Brain natriuretic peptide   Basic metabolic panel   No orders of the defined types were placed in this encounter.   Patient Instructions  Medication Instructions:  Your physician recommends that you continue on your current medications as directed. Please refer to the Current Medication list given to you today.   *If you need a refill on your cardiac medications before your next appointment, please call your pharmacy*   Lab Work: Your physician recommends that you complete lab work today. BNP & BMET  If you have labs (blood work) drawn today and your tests are completely normal, you will receive your results only by: [place] (if you have MyChart) OR A paper copy in the mail If you have any lab test that is abnormal or we need to change your treatment, we will call you to review the results.   Testing/Procedures: NONE ordered at this time of appointment      Follow-Up: At [facility], you and your health needs are our priority.  As part of our continuing mission to provide you with exceptional heart care, we have created designated Provider Care Teams.  These Care Teams include your primary Cardiologist (physician) and Advanced Practice Providers (APPs -  Physician Assistants and Nurse Practitioners) who all work together  to provide you with the care you need, when you need it.  We recommend signing up for the patient portal called "MyChart".  Sign up information is provided on this After Visit Summary.  MyChart is used to connect with patients for Virtual Visits (Telemedicine).  Patients are able to view lab/test results, encounter notes, upcoming appointments, etc.   Non-urgent messages can be sent to your provider as well.   To learn more about what you can do with MyChart, go to [email or web address].    Your next appointment:    Keep follow up   The format for your next appointment:   In Person  Provider:   Caron Presume, PA-C        Other Instructions   Important Information About Sugar         Signed, Darreld Mclean, PA-C  06/10/2022 10:06 AM    [place]

## 2022-06-10 ENCOUNTER — Ambulatory Visit: Payer: Medicare Other | Attending: Student | Admitting: Student

## 2022-06-10 ENCOUNTER — Encounter: Payer: Self-pay | Admitting: Student

## 2022-06-10 VITALS — BP 112/53 | HR 64 | Ht 67.0 in | Wt 170.4 lb

## 2022-06-10 DIAGNOSIS — I5032 Chronic diastolic (congestive) heart failure: Secondary | ICD-10-CM

## 2022-06-10 DIAGNOSIS — I4729 Other ventricular tachycardia: Secondary | ICD-10-CM | POA: Diagnosis not present

## 2022-06-10 DIAGNOSIS — I471 Supraventricular tachycardia, unspecified: Secondary | ICD-10-CM

## 2022-06-10 DIAGNOSIS — I2510 Atherosclerotic heart disease of native coronary artery without angina pectoris: Secondary | ICD-10-CM | POA: Diagnosis not present

## 2022-06-10 DIAGNOSIS — R600 Localized edema: Secondary | ICD-10-CM

## 2022-06-10 DIAGNOSIS — I480 Paroxysmal atrial fibrillation: Secondary | ICD-10-CM

## 2022-06-10 NOTE — Patient Instructions (Signed)
Medication Instructions:  Your physician recommends that you continue on your current medications as directed. Please refer to the Current Medication list given to you today.   *If you need a refill on your cardiac medications before your next appointment, please call your pharmacy*   Lab Work: Your physician recommends that you complete lab work today. BNP & BMET  If you have labs (blood work) drawn today and your tests are completely normal, you will receive your results only by: [place] (if you have MyChart) OR A paper copy in the mail If you have any lab test that is abnormal or we need to change your treatment, we will call you to review the results.   Testing/Procedures: NONE ordered at this time of appointment      Follow-Up: At [facility], you and your health needs are our priority.  As part of our continuing mission to provide you with exceptional heart care, we have created designated Provider Care Teams.  These Care Teams include your primary Cardiologist (physician) and Advanced Practice Providers (APPs -  Physician Assistants and Nurse Practitioners) who all work together to provide you with the care you need, when you need it.  We recommend signing up for the patient portal called "MyChart".  Sign up information is provided on this After Visit Summary.  MyChart is used to connect with patients for Virtual Visits (Telemedicine).  Patients are able to view lab/test results, encounter notes, upcoming appointments, etc.  Non-urgent messages can be sent to your provider as well.   To learn more about what you can do with MyChart, go to [email or web address].    Your next appointment:    Keep follow up   The format for your next appointment:   In Person  Provider:   Caron Presume, PA-C        Other Instructions   Important Information About Sugar

## 2022-06-11 ENCOUNTER — Encounter: Payer: Self-pay | Admitting: Internal Medicine

## 2022-06-11 LAB — BASIC METABOLIC PANEL
BUN/Creatinine Ratio: 13 (ref 10–24)
BUN: 17 mg/dL (ref 8–27)
CO2: 26 mmol/L (ref 20–29)
Calcium: 8.8 mg/dL (ref 8.6–10.2)
Chloride: 97 mmol/L (ref 96–106)
Creatinine, Ser: 1.33 mg/dL — ABNORMAL HIGH (ref 0.76–1.27)
Glucose: 108 mg/dL — ABNORMAL HIGH (ref 70–99)
Potassium: 4 mmol/L (ref 3.5–5.2)
Sodium: 138 mmol/L (ref 134–144)
eGFR: 57 mL/min/{1.73_m2} — ABNORMAL LOW (ref >59)

## 2022-06-11 LAB — BRAIN NATRIURETIC PEPTIDE: BNP: 93.6 pg/mL (ref 0.0–100.0)

## 2022-06-15 ENCOUNTER — Other Ambulatory Visit: Payer: Self-pay | Admitting: Internal Medicine

## 2022-06-15 ENCOUNTER — Telehealth: Payer: Self-pay

## 2022-06-15 NOTE — Telephone Encounter (Signed)
error 

## 2022-06-15 NOTE — Telephone Encounter (Signed)
Patient wife called and LVM reporting pt was having increased muscle pain in his legs and low back as well as "hallucinations".  This RN returned the call and spoke with both wife and pt. Pt reported having leg/low back pain and confirmed that he was not having hallucinations, but instead the "christmas lights were playing tricks on his eyes." Pt reported taking medications as prescribed and is still having muscle pain. Pt instructed to get OTC voltaren gel to help with muscle pain, f/u in the office in a week to see how he tolerates using the gel. No further needs at this point.

## 2022-06-16 ENCOUNTER — Encounter: Payer: Self-pay | Admitting: Radiation Oncology

## 2022-06-17 ENCOUNTER — Ambulatory Visit: Payer: Medicare Other | Admitting: Physician Assistant

## 2022-06-17 ENCOUNTER — Telehealth: Payer: Self-pay | Admitting: Medical Oncology

## 2022-06-17 ENCOUNTER — Ambulatory Visit (HOSPITAL_COMMUNITY)
Admission: RE | Admit: 2022-06-17 | Discharge: 2022-06-17 | Disposition: A | Discharge status: Home / Self Care | Payer: Medicare Other | Source: Ambulatory Visit | Attending: Internal Medicine | Admitting: Internal Medicine

## 2022-06-17 DIAGNOSIS — N3289 Other specified disorders of bladder: Secondary | ICD-10-CM | POA: Diagnosis not present

## 2022-06-17 DIAGNOSIS — R911 Solitary pulmonary nodule: Secondary | ICD-10-CM | POA: Diagnosis not present

## 2022-06-17 DIAGNOSIS — J439 Emphysema, unspecified: Secondary | ICD-10-CM | POA: Diagnosis not present

## 2022-06-17 DIAGNOSIS — C3490 Malignant neoplasm of unspecified part of unspecified bronchus or lung: Secondary | ICD-10-CM | POA: Insufficient documentation

## 2022-06-17 MED ORDER — SODIUM CHLORIDE (PF) 0.9 % IJ SOLN
INTRAMUSCULAR | Status: AC
  Filled 2022-06-17: qty 50

## 2022-06-17 MED ORDER — IOHEXOL 300 MG/ML  SOLN
100.0000 mL | Freq: Once | INTRAMUSCULAR | Status: AC | PRN
  Administered 2022-06-17: 100 mL via INTRAVENOUS

## 2022-06-17 NOTE — Telephone Encounter (Signed)
I told pt he does not need labs prior to CT scan today ,

## 2022-06-19 NOTE — Progress Notes (Incomplete)
  Radiation Oncology         [phone] ________________________________   Radiation Oncology         [phone] ________________________________  Name: Albert Perez MRN: [id number]  Date: 05/19/2022  DOB: [birth year]  End of Treatment Note  Diagnosis: The primary encounter diagnosis was Lung cancer metastatic to bone [facility]). A diagnosis of Adenocarcinoma of left lung, stage 4 (HCC) was also pertinent to this visit.   Increasing right hip pain associated with new lytic destructive lesions of the right iliac wing and right inferior pubic ramus (October 2023)   Stage IV (T3, N0, M1c) non-small cell lung cancer, adenocarcinoma: presented with cavitary left lower lobe lung mass in addition to other pulmonary nodules and scattered hypermetabolic osseous metastatic disease involving the spine, ribs and bony pelvis as well as left proximal humeral pathologic fracture.      Indication for treatment: Palliative        Radiation treatment dates: 4 treatment sessions on: 05/14/2022, 05/17/2022, 05/18/2022, 05/19/2022  Site/dose: Right Pelvis/Ilium treated with 30 Gy delivered in 10 fractions of 3 Gy/Fx  Beams/energy: 6X  Narrative: The patient tolerated radiation treatment relatively well. During his final weekly treatment check on 05/20/22, the patient reported pain, fatigue, constipation, and swelling in his lower extremities which started around the beginning of treatment. Physical exam performed that same date showed significant swelling in both lower extremities more so along the right side. No signs of infection or obvious palpable cords were noted.   Unfortunately, the patient did not have any improvement in his right pelvic pain (thus far) with his treatments.  Given his significant edema, he was given a limited prescription for Lasix to use for 3 days.  I also recommended that he consult with his PCP if this continues to be an issue.  We also discussed the importance of elevating his  legs above his hips.    Plan: The patient has completed radiation treatment. The patient will return to radiation oncology clinic for routine follow-up in one month. I advised them to call or return sooner if they have any questions or concerns related to their recovery or treatment.  -----------------------------------  Blair Promise, PhD, MD  This document serves as a record of services personally performed by Gery Pray, MD. It was created on his behalf by Roney Mans, a trained medical scribe. The creation of this record is based on the scribe's personal observations and the provider's statements to them. This document has been checked and approved by the attending provider.

## 2022-06-21 ENCOUNTER — Ambulatory Visit: Payer: Medicare Other

## 2022-06-21 ENCOUNTER — Ambulatory Visit
Admission: RE | Admit: 2022-06-21 | Discharge: 2022-06-21 | Disposition: A | Discharge status: Home / Self Care | Payer: Medicare Other | Source: Ambulatory Visit | Attending: Radiation Oncology | Admitting: Radiation Oncology

## 2022-06-21 ENCOUNTER — Other Ambulatory Visit: Payer: Medicare Other

## 2022-06-21 VITALS — BP 120/50 | Temp 97.7°F | Resp 18 | Ht 67.0 in | Wt 170.4 lb

## 2022-06-21 DIAGNOSIS — C3492 Malignant neoplasm of unspecified part of left bronchus or lung: Secondary | ICD-10-CM | POA: Diagnosis not present

## 2022-06-21 DIAGNOSIS — C7951 Secondary malignant neoplasm of bone: Secondary | ICD-10-CM | POA: Diagnosis not present

## 2022-06-21 DIAGNOSIS — C3490 Malignant neoplasm of unspecified part of unspecified bronchus or lung: Secondary | ICD-10-CM | POA: Insufficient documentation

## 2022-06-21 NOTE — Telephone Encounter (Signed)
Left message to call back and send weights from the 10th until now.

## 2022-06-21 NOTE — Telephone Encounter (Signed)
What are his weights doing at home?

## 2022-06-21 NOTE — Progress Notes (Unsigned)
[place] OFFICE PROGRESS NOTE  Aura Dials, MD [place] [address]  DIAGNOSIS: Stage IV (T3, N0, M1c) non-small cell lung cancer, adenocarcinoma presented with cavitary left lower lobe lung mass in addition to other pulmonary nodules and scattered hypermetabolic osseous metastatic disease involving the spine, ribs and bony pelvis as well as left proximal humeral pathologic fracture.    Molecular Biomarkers performed by [employer] DETECTED ALTERATION(S) / [name]      [id number]  Afatinib, Dacomitinib, Erlotinib, Gefitinib, Osimertinib, Ramucirumab Neratinib   FGFR1Amplification  Erdafitinib, Lenvatinib, Nintedanib, Pazopanib, Pemigatinib, Ponatinib   [id number] None  PRIOR THERAPY: 1) Palliative radiotherapy to the the left humerus under the care of Dr. Sondra Come. Last treatment ~04/24/2019  2) Palliative radiation to the Right Pelvis/Ilium under the care of Dr. Sondra Come. Last dose 05/19/22  CURRENT THERAPY:  1) Targeted therapy with Tagrisso 80 p.o. daily.  He started the first dose on April 21, 2019. Status post 38 months of treatment.   2) Xgeva every 4 weeks, first dose 04/27/22  INTERVAL HISTORY: ACHERON SUGG 71 y.o. male returns for follow-up visit accompanied by his wife.  The patient was last seen in the clinic by Dr. Julien Nordmann on 04/27/2022.  At that point in time, the patient had a new metastatic lesion to the right iliac for which the patient recently completed palliative radiation under the care of Dr. Sondra Come on 05/19/2022.  He is followed closely by palliative care for pain management for which she is currently on [place]. He is meeting with them today.   Patient has been having a challenging time since last being seen.  His main concern is related to bilateral lower extremity swelling.  He is compliant with compression stockings.  He has some limitations with elevating his lower extremity secondary to pain.  The  patient spoke to his cardiologist who recommended increasing his torsemide to 40 mg which he is going to start see if that helps.  He was not having significant improvement with 20 mg of torsemide.  The patient was supposed to have an echocardiogram yesterday but it could not be completed secondary to too much pain getting on the table to have this performed.  I see that he has a follow-up with cardiology scheduled in early January 2024.  His wife is wondering if he could be seen by physical therapy for assistance with wrapping his lower extremities as he is now starting to have weeping from the swelling.  He knows to avoid foods high in salt.  He denies any fever, chills, or night sweats.  He lost weight since last being seen and reports that he has a poor appetite and has been drinking 3 boost per day.   He reports that his breathing is "great".  He denies any shortness of breath, cough, chest pain, or hemoptysis.  Denies any nausea or vomiting.  He intermittently follows with diarrhea alternating with constipation.  Denies any headache or visual changes.  Denies any rashes or skin changes.   Unfortunately, the patient was disqualified from his patient assistance for [place].  Will forward, his Newman Nip will be filled at the [facility].   He recently had a restaging CT scan performed.  He is here today for evaluation and repeat blood work.  MEDICAL HISTORY: Past Medical History:  Diagnosis Date   A-fib [facility])    PMH: 2000-2001   Arthritis    Cancer ([facility])    melanoma 2018   Diarrhea  due to medications   DVT (deep venous thrombosis) (HCC)    Left brachial vein DVT (01/30/19, following fracture)   Dysrhythmia    afib -    ETOH abuse    GERD (gastroesophageal reflux disease)    ocassional   History of radiation therapy    Right Hip 02/08/22-02/19/22-Dr. Gery Pray   History of radiation therapy    Right Pelvis-05/06/22-05/19/22- Dr. Gery Pray   Lung cancer [facility]) dx'd 02/26/2019    Metastatic cancer to bone ([place]) dx'd 02/26/2019   Sleep apnea    do not wear CPAP   Stroke ([place]) 2019   " mini"   Vertigo    Wears glasses     ALLERGIES:  has No Known Allergies.  MEDICATIONS:  Current Outpatient Medications  Medication Sig Dispense Refill   buPROPion (WELLBUTRIN XL) 150 MG 24 hr tablet Take 150 mg by mouth daily as needed (nicotine cravings).     carvedilol (COREG) 6.25 MG tablet TAKE 1 TABLET BY MOUTH TWICE A DAY 180 tablet 3   Cholecalciferol (VITAMIN D-3) 25 MCG (1000 UT) CAPS Take 1,000 Units by mouth daily.     ELIQUIS 5 MG TABS tablet TAKE 1 TABLET BY MOUTH TWICE A DAY 60 tablet 2   loperamide (IMODIUM) 2 MG capsule Take 2 mg by mouth as needed for diarrhea or loose stools.     Magnesium 500 MG CAPS Take 1,000 mg by mouth daily.     Multiple Vitamin (MULTIVITAMIN PO) Take 1 tablet by mouth daily. Centrum     osimertinib mesylate (TAGRISSO) 80 MG tablet Take 1 tablet (80 mg total) by mouth daily. 60 tablet 3   OVER THE COUNTER MEDICATION Take 1 tablet by mouth daily. Standard Process, LivaPlex (Liver supplement)     oxyCODONE ER (XTAMPZA ER) 18 MG C12A Take 18 mg by mouth every 12 (twelve) hours. 60 capsule 0   oxyCODONE-acetaminophen (PERCOCET) 10-325 MG tablet Take 1 tablet by mouth every 4 (four) hours as needed for pain. 90 tablet 0   potassium chloride (KLOR-CON) 10 MEQ tablet Take 1 tablet (10 mEq total) by mouth daily. 90 tablet 1   rosuvastatin (CRESTOR) 5 MG tablet Take 5 mg by mouth daily.  2   torsemide (DEMADEX) 20 MG tablet Take 2 tablets (40 mg total) by mouth 2 (two) times daily. 180 tablet 1   TURMERIC PO Take 1 capsule by mouth daily.     vitamin B-12 (CYANOCOBALAMIN) 1000 MCG tablet Take 1,000 mcg by mouth daily.     No current facility-administered medications for this visit.    SURGICAL HISTORY:  Past Surgical History:  Procedure Laterality Date   ANKLE FRACTURE SURGERY Right 2003   COLONOSCOPY  2018   ELBOW SURGERY Left 1964    INGUINAL HERNIA REPAIR Bilateral 11/12/2020   Procedure: BILATERAL OPEN INGUINAL HERNIA REPAIR WITH MESH;  Surgeon: Coralie Keens, MD;  Location: [facility];  Service: General;  Laterality: Bilateral;   melanoma removal  04/2017   Lt shoulder   RETINAL DETACHMENT SURGERY Right 1971   TONSILLECTOMY     VIDEO BRONCHOSCOPY WITH ENDOBRONCHIAL NAVIGATION N/A 03/28/2019   Procedure: VIDEO BRONCHOSCOPY WITH ENDOBRONCHIAL NAVIGATION and Biopsies;  Surgeon: Collene Gobble, MD;  Location: [facility] OR;  Service: Thoracic;  Laterality: N/A;    REVIEW OF SYSTEMS:   Review of Systems  Constitutional: Positive for decreased appetite and weight loss.  Negative for chills, fatigue, and fever.  HENT: Negative for mouth sores, nosebleeds, sore throat and trouble  swallowing.   Eyes: Negative for eye problems and icterus.  Respiratory: Negative for cough, hemoptysis, shortness of breath and wheezing.   Cardiovascular: Negative for chest pain.  Positive for lower extremity swelling bilaterally. Gastrointestinal: Negative for abdominal pain, constipation, diarrhea, nausea and vomiting.  Genitourinary: Negative for bladder incontinence, difficulty urinating, dysuria, frequency and hematuria.   Musculoskeletal: Positive for pain in his lower extremities.  Negative for back pain, gait problem, neck pain and neck stiffness.  Skin: Negative for itching and rash.  Neurological: Negative for dizziness, extremity weakness, gait problem, headaches, light-headedness and seizures.  Hematological: Negative for adenopathy. Does not bruise/bleed easily.  Psychiatric/Behavioral: Negative for confusion, depression and sleep disturbance. The patient is not nervous/anxious.     PHYSICAL EXAMINATION:  Blood pressure (!) 118/39, pulse 61, temperature (!) 97.1 F (36.2 C), temperature source Tympanic, resp. rate 14, weight 166 lb 1.6 oz (75.3 kg), SpO2 100 %.  ECOG PERFORMANCE STATUS: 1  Physical Exam  Constitutional: Oriented to  person, place, and time and well-developed, well-nourished, and in no distress.  HENT:  Head: Normocephalic and atraumatic.  Mouth/Throat: Oropharynx is clear and moist. No oropharyngeal exudate.  Eyes: Conjunctivae are normal. Right eye exhibits no discharge. Left eye exhibits no discharge. No scleral icterus.  Neck: Normal range of motion. Neck supple.  Cardiovascular: Normal rate, regular rhythm, normal heart sounds and intact distal pulses.   Pulmonary/Chest: Effort normal and breath sounds normal. No respiratory distress. No wheezes. No rales.  Abdominal: Soft. Bowel sounds are normal. Exhibits no distension and no mass. There is no tenderness.  Musculoskeletal: Normal range of motion.  Positive for bilateral lower extremity swelling with weeping of the right leg. Lymphadenopathy:    No cervical adenopathy.  Neurological: Alert and oriented to person, place, and time. Exhibits normal muscle tone.  Ambulates with a walker.  Coordination normal.  Skin: Skin is warm and dry. No rash noted. Not diaphoretic. No erythema. No pallor.  Psychiatric: Mood, memory and judgment normal.  Vitals reviewed.  LABORATORY DATA: Lab Results  Component Value Date   WBC 6.9 06/23/2022   HGB 10.7 (L) 06/23/2022   HCT 31.5 (L) 06/23/2022   MCV 91.8 06/23/2022   PLT 245 06/23/2022      Chemistry      Component Value Date/Time   NA 136 06/23/2022 1439   NA 138 06/10/2022 1006   K 4.4 06/23/2022 1439   CL 99 06/23/2022 1439   CO2 30 06/23/2022 1439   BUN 23 06/23/2022 1439   BUN 17 06/10/2022 1006   CREATININE 1.32 (H) 06/23/2022 1439      Component Value Date/Time   CALCIUM 8.9 06/23/2022 1439   ALKPHOS 96 06/23/2022 1439   AST 20 06/23/2022 1439   ALT 23 06/23/2022 1439   BILITOT 0.5 06/23/2022 1439       RADIOGRAPHIC STUDIES:  CT Chest W Contrast  Result Date: 06/17/2022 CLINICAL DATA:  Metastatic lung cancer restaging * Tracking Code: BO * EXAM: CT CHEST, ABDOMEN, AND PELVIS WITH  CONTRAST TECHNIQUE: Multidetector CT imaging of the chest, abdomen and pelvis was performed following the standard protocol during bolus administration of intravenous contrast. RADIATION DOSE REDUCTION: This exam was performed according to the departmental dose-optimization program which includes automated exposure control, adjustment of the mA and/or kV according to patient size and/or use of iterative reconstruction technique. CONTRAST:  131m OMNIPAQUE IOHEXOL 300 MG/ML  SOLN COMPARISON:  CT pelvis, 04/27/2022, CT chest abdomen pelvis, 02/26/2022 FINDINGS: CT CHEST FINDINGS Cardiovascular: Aortic  atherosclerosis. Normal heart size. Three-vessel coronary calcifications. No pericardial effusion. Mediastinum/Nodes: No enlarged mediastinal, hilar, or axillary lymph nodes. Thyroid gland, trachea, and esophagus demonstrate no significant findings. Lungs/Pleura: Unchanged spiculated mass of the superior segment left lower lobe measuring 3.5 x 2.3 cm (series 4, image 63). Unchanged pulmonary nodules, including a 0.3 cm nodule of the azygoesophageal recess of the right lower lobe (series 4, image 111) and a 1.5 x 0.6 cm peribronchovascular nodule of the medial segment right middle lobe (series 4, image 67). Minimal paraseptal emphysema with a background of fine centrilobular pulmonary nodularity, most concentrated in the lung apices. No pleural effusion or pneumothorax. Musculoskeletal: No chest wall abnormality. No acute osseous findings. CT ABDOMEN PELVIS FINDINGS Hepatobiliary: No solid liver abnormality is seen. No gallstones, gallbladder wall thickening, or biliary dilatation. Pancreas: Unremarkable. No pancreatic ductal dilatation or surrounding inflammatory changes. Spleen: Normal in size without significant abnormality. Adrenals/Urinary Tract: Adrenal glands are unremarkable. Kidneys are normal, without renal calculi, solid lesion, or hydronephrosis. Diffuse thickening of the urinary bladder wall, likely secondary  to chronic outlet obstruction (series 6, image 70). Stomach/Bowel: Stomach is within normal limits. Appendix appears normal. No evidence of bowel wall thickening, distention, or inflammatory changes. Vascular/Lymphatic: Aortic atherosclerosis. Interval enlargement of retroperitoneal lymph nodes, largest left retroperitoneal node measuring 1.8 x 1.5 cm, previously 1.0 x 0.9 cm (series 2, image 68). Reproductive: No mass or other abnormality. Other: No abdominal wall hernia or abnormality. No ascites. Musculoskeletal: No acute osseous findings. When compared to most recent imaging of the pelvis dated 04/27/2022, there has been slight interval decrease in size of a lytic lesion with a large extraosseous soft tissue component of the right iliac crest measuring 3.2 x 2.8 cm, previously 4.9 x 3.8 cm (series 2, image 93). Similar appearance of additional lytic lesions of the medial right acetabulum as well as the right inferior pubic ramus, with subtle evidence of interval post treatment sclerosis (series 2, image 105, 115). Additional mixed lytic and sclerotic osseous metastatic lesions involving the vertebral bodies and other portions of the pelvis are not significantly changed in comparison to both prior examination dated 02/26/2022 and 04/27/2022. IMPRESSION: 1. Unchanged spiculated mass of the superior segment left lower lobe. 2. Unchanged small pulmonary nodules. 3. When compared to most recent imaging of the pelvis dated 04/27/2022, there has been slight interval decrease in size of a lytic lesion with a large extraosseous soft tissue component of the right iliac crest. Similar appearance of additional lytic lesions of the medial right acetabulum as well as the right inferior pubic ramus, with subtle evidence of interval post treatment sclerosis. Findings are consistent with treatment response to local radiation therapy. 4. Additional treated mixed lytic and sclerotic osseous metastatic lesions involving the  vertebral bodies and other portions of the pelvis are not significantly changed in comparison to both prior examination dated 02/26/2022 and 04/27/2022. 5. Interval enlargement of retroperitoneal lymph nodes, concerning for early nodal metastatic disease. PET-CT may be helpful to assess for metabolic activity. 6. Minimal emphysema with a background of fine centrilobular pulmonary nodularity, most concentrated in the lung apices, consistent with smoking-related respiratory bronchiolitis. 7. Coronary artery disease. Aortic Atherosclerosis (ICD10-I70.0) and Emphysema (ICD10-J43.9). Electronically Signed   By: Delanna Ahmadi M.D.   On: 06/17/2022 14:35   CT Abdomen Pelvis W Contrast  Result Date: 06/17/2022 CLINICAL DATA:  Metastatic lung cancer restaging * Tracking Code: BO * EXAM: CT CHEST, ABDOMEN, AND PELVIS WITH CONTRAST TECHNIQUE: Multidetector CT imaging of the chest, abdomen  and pelvis was performed following the standard protocol during bolus administration of intravenous contrast. RADIATION DOSE REDUCTION: This exam was performed according to the departmental dose-optimization program which includes automated exposure control, adjustment of the mA and/or kV according to patient size and/or use of iterative reconstruction technique. CONTRAST:  129m OMNIPAQUE IOHEXOL 300 MG/ML  SOLN COMPARISON:  CT pelvis, 04/27/2022, CT chest abdomen pelvis, 02/26/2022 FINDINGS: CT CHEST FINDINGS Cardiovascular: Aortic atherosclerosis. Normal heart size. Three-vessel coronary calcifications. No pericardial effusion. Mediastinum/Nodes: No enlarged mediastinal, hilar, or axillary lymph nodes. Thyroid gland, trachea, and esophagus demonstrate no significant findings. Lungs/Pleura: Unchanged spiculated mass of the superior segment left lower lobe measuring 3.5 x 2.3 cm (series 4, image 63). Unchanged pulmonary nodules, including a 0.3 cm nodule of the azygoesophageal recess of the right lower lobe (series 4, image 111) and a  1.5 x 0.6 cm peribronchovascular nodule of the medial segment right middle lobe (series 4, image 67). Minimal paraseptal emphysema with a background of fine centrilobular pulmonary nodularity, most concentrated in the lung apices. No pleural effusion or pneumothorax. Musculoskeletal: No chest wall abnormality. No acute osseous findings. CT ABDOMEN PELVIS FINDINGS Hepatobiliary: No solid liver abnormality is seen. No gallstones, gallbladder wall thickening, or biliary dilatation. Pancreas: Unremarkable. No pancreatic ductal dilatation or surrounding inflammatory changes. Spleen: Normal in size without significant abnormality. Adrenals/Urinary Tract: Adrenal glands are unremarkable. Kidneys are normal, without renal calculi, solid lesion, or hydronephrosis. Diffuse thickening of the urinary bladder wall, likely secondary to chronic outlet obstruction (series 6, image 70). Stomach/Bowel: Stomach is within normal limits. Appendix appears normal. No evidence of bowel wall thickening, distention, or inflammatory changes. Vascular/Lymphatic: Aortic atherosclerosis. Interval enlargement of retroperitoneal lymph nodes, largest left retroperitoneal node measuring 1.8 x 1.5 cm, previously 1.0 x 0.9 cm (series 2, image 68). Reproductive: No mass or other abnormality. Other: No abdominal wall hernia or abnormality. No ascites. Musculoskeletal: No acute osseous findings. When compared to most recent imaging of the pelvis dated 04/27/2022, there has been slight interval decrease in size of a lytic lesion with a large extraosseous soft tissue component of the right iliac crest measuring 3.2 x 2.8 cm, previously 4.9 x 3.8 cm (series 2, image 93). Similar appearance of additional lytic lesions of the medial right acetabulum as well as the right inferior pubic ramus, with subtle evidence of interval post treatment sclerosis (series 2, image 105, 115). Additional mixed lytic and sclerotic osseous metastatic lesions involving the  vertebral bodies and other portions of the pelvis are not significantly changed in comparison to both prior examination dated 02/26/2022 and 04/27/2022. IMPRESSION: 1. Unchanged spiculated mass of the superior segment left lower lobe. 2. Unchanged small pulmonary nodules. 3. When compared to most recent imaging of the pelvis dated 04/27/2022, there has been slight interval decrease in size of a lytic lesion with a large extraosseous soft tissue component of the right iliac crest. Similar appearance of additional lytic lesions of the medial right acetabulum as well as the right inferior pubic ramus, with subtle evidence of interval post treatment sclerosis. Findings are consistent with treatment response to local radiation therapy. 4. Additional treated mixed lytic and sclerotic osseous metastatic lesions involving the vertebral bodies and other portions of the pelvis are not significantly changed in comparison to both prior examination dated 02/26/2022 and 04/27/2022. 5. Interval enlargement of retroperitoneal lymph nodes, concerning for early nodal metastatic disease. PET-CT may be helpful to assess for metabolic activity. 6. Minimal emphysema with a background of fine centrilobular pulmonary nodularity, most  concentrated in the lung apices, consistent with smoking-related respiratory bronchiolitis. 7. Coronary artery disease. Aortic Atherosclerosis (ICD10-I70.0) and Emphysema (ICD10-J43.9). Electronically Signed   By: Delanna Ahmadi M.D.   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    He is  status post palliative radiotherapy to the metastatic disease in the left arm under the care of Dr. Sondra Come. This was completed in October 2020.   The patient is currently undergoing treatment with target therapy with Tagrisso 80 mg p.o. daily status post 38 months of treatment.   She had palliative radiation to the right hip due to a new lesion under the care of Dr. Sondra Come and this was completed on 05/19/2022.  The patient recently had a restaging CT scan performed.  Dr. Julien Nordmann personally independently reviewed the scan discussed results with the patient today.  The scan did not show any evidence of disease progression except for possible early nodal metastatic disease in the abdomen/retroperitoneal lymph nodes.  Dr. Julien Nordmann recommends monitoring this area closely.  Will arrange for short interval follow-up repeat CT scan in 2 months.  Dr. Julien Nordmann is hesitant to arrange for radiation at this time given his lower extremity swelling.  Also, if he has disease progression in the future we may have to consider chemotherapy although we would like to avoid that as long as possible unless clear disease progression.  Will see the patient back for a follow-up visit in 1 month for lab work and repeat blood work.  This patient should be undergoing Xgeva monthly.  I do not see that this has been performed since 04/27/2022.  I will arrange for this to be performed at his next lab/follow up visit in 1 month to avoid multiple appointments/trips to the cancer center.  He will continue taking Eliquis for his PE.  The patient met with the patient assistance presented from pharmacy today.  Since the patient now disqualifies from his prior assistance, his Newman Nip will be filled at the [facility].  Notified the patient of this.   Patient will meet with palliative care today regarding his pain management.  We are going to refer the patient to the lymphedema clinic to see if that can assist with his lower  extremity swelling.  He is already compliant with a low salt diet, compression stockings, and elevating his legs when possible although this is limited due to pain.  Per cardiology, the patient is going to increase his torsemide to 40 mg.  He was supposed to have an echocardiogram but this was canceled secondary to being unable to get on the table.  He is expected to follow-up with cardiology in early January.  The patient was advised to call immediately if he has any concerning symptoms in the interval. The patient voices understanding of current disease status and treatment options and is in agreement with the current care plan. All questions were answered. The patient knows to call the clinic with any problems, questions or concerns. We can certainly see the patient much sooner if necessary  Orders Placed This Encounter  Procedures   CBC with Differential [facility]  Only)    Standing Status:   Future    Standing Expiration Date:   06/24/2023   CMP ([place] only)    Standing Status:   Future    Standing Expiration Date:   06/24/2023   Ambulatory referral to Physical Therapy    Referral Priority:   Routine    Referral Type:   Physical Medicine    Referral Reason:   Specialty Services Required    Requested Specialty:   Physical Therapy    Number of Visits Requested:   [place], PA-C 06/23/22  ADDENDUM: Hematology/Oncology Attending: I had a face-to-face encounter with the patient today.  I reviewed his record, lab, scan and recommended his care plan.  This is a very pleasant 71 years old white male with a stage IV non-small cell lung cancer, adenocarcinoma diagnosed in September 2020 was positive EGFR mutation with exon 19 deletion.  He has been on treatment with Tagrisso 80 mg p.o. daily since October 2020 status post 38 months of treatment and has been tolerating this treatment well.  The patient also had palliative radiotherapy to the left humerus bone  metastasis as well as the right pelvis/ilium under the care of Dr. Sondra Come.  He is on treatment with Xgeva for bone metastasis. He is also followed by the palliative care team and currently on treatment with Xtampza.  His pain is not well-controlled and he is followed by the palliative care team. Has been complaining of generalized pain especially in the pelvic area as well as bilateral lower extremity swelling.  He was seen by his cardiologist who recommended increasing his dose of torsemide to 40 mg p.o. daily which is helping some.  He missed his appointment for 2D echo yesterday because he could not make it to the appointment because of increased pain. The patient had repeat CT scan of the chest, abdomen and pelvis performed recently.  I personally and independently reviewed the scan images and discussed the result with the patient and his wife. His scan showed stable disease except for slightly increased abdominal lymph nodes that need close monitoring for possible disease progression. I recommended for the patient to continue his current treatment with Tagrisso with the same dose for now. I will consider repeating his imaging studies in around 2 months to rule out any disease progression in the abdomen. For the lower extremity swelling, he will continue his current treatment with furosemide and we will refer the patient to the lymphedema clinic for any additional intervention. He will come back for follow-up visit in 1 months for evaluation and repeat blood work. The patient will see the palliative care team later today for help in management with his pain issues. He was advised to call immediately if he has any other concerning symptoms in the interval. The total time spent in the appointment was 30 minutes. Disclaimer: This note was dictated with voice recognition software. Similar sounding words can inadvertently be transcribed and may be missed upon review. Eilleen Kempf, MD

## 2022-06-21 NOTE — Progress Notes (Signed)
Radiation Oncology         [phone] ________________________________  Name: Albert Perez MRN: [id number]  Date: 06/21/2022  DOB: [birth year]  Follow-Up Visit Note  CC: Aura Dials, MD  Aura Dials, MD    ICD-10-CM   1. 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 Adenocarcinoma of left lung, stage 4 (HCC)  C34.92       Diagnosis: The primary encounter diagnosis was Lung cancer metastatic to bone [facility]). A diagnosis of Adenocarcinoma of left lung, stage 4 (HCC) was also pertinent to this visit.   Increasing right hip pain associated with new lytic destructive lesions of the right iliac wing and right inferior pubic ramus (October 2023)   Stage IV (T3, N0, M1c) non-small cell lung cancer, adenocarcinoma: presented with cavitary left lower lobe lung mass in addition to other pulmonary nodules and scattered hypermetabolic osseous metastatic disease involving the spine, ribs and bony pelvis as well as left proximal humeral pathologic fracture.      Interval Since Last Radiation: 1 month and 3 days   Indication for treatment: Palliative       Radiation treatment dates: 4 treatment sessions on: 05/14/2022, 05/17/2022, 05/18/2022, 05/19/2022 Site/dose: Right Pelvis/Ilium treated with 30 Gy delivered in 10 fractions of 3 Gy/Fx Beams/energy: 6X  Narrative:  The patient returns today for routine follow-up. The patient tolerated radiation treatment relatively well. During his final weekly treatment check on 05/20/22, the patient reported persistent right pelvic pain, fatigue, constipation, and swelling in his lower extremities which started around the beginning of treatment. Physical exam performed that same date showed significant swelling in both lower extremities more so along the right side. No signs of infection or obvious palpable cords were noted.  Given his significant edema, he was given a limited prescription for Lasix to use for 3 days.  I also recommended  that he consult with his PCP if this continues to be an issue.  We also discussed the importance of elevating his legs above his hips.  Since completing radiation therapy, the patient followed up with palliative medicine on 05/25/22 for symptom management. During which time, the patient endorsed no significant changes and persistent lower LE edema (for which he was soon started on torsemide for per cardiology on 06/02/22). Given that the patient endorsed more intense pain with activity, he was advised to increase his oxycodone from 10 mg q4 hours to 10 mg every 4-6 hours as needed for breakthrough pain.    The patient also followed up with cardiology on 06/10/22 in regards to his LE edema.  During this visit, the patient endorsed some improvement in his bilateral LE edema and associated pain since being switched to torsemide. Given his improvement with torsemide, he will continue with this regimen (which also includes continued compression garment use and a low sodium diet).                Pertinent imaging performed in the interval includes a CT CAP on 06/17/22 which demonstrated: no change of the spiculated mass of the superior segment left lower lobe, stable small pulmonary nodules, a slight interval decrease in size of the lytic lesion with a large extraosseous soft tissue component of the right iliac crest; similar appearance of additional lytic lesions of the medial right acetabulum as well as the right inferior pubic ramus (with subtle evidence of interval post treatment sclerosis); and no significant change in the additional treated mixed lytic  and sclerotic osseous metastatic lesions involving the vertebral bodies and other portions of the pelvis. However, CT also showed an increase in size of retroperitoneal lymph nodes, concerning for early nodal metastatic disease.   On evaluation today the patient reports ongoing pain along the right pelvis.  This is manageable with pain medication but he does  admit significant difficulty with sleeping at night.  He is unable to lie flat due to pain in his right pelvis.  Therefore he is having significant problems with swelling in his lower extremities with sitting in a recliner all night sleep.  He is having some leakage from his edema along the right lower extremity.  He is scheduled for echocardiogram ordered by cardiology later this week.        Allergies:  has No Known Allergies.  Meds: Current Outpatient Medications  Medication Sig Dispense Refill   buPROPion (WELLBUTRIN XL) 150 MG 24 hr tablet Take 150 mg by mouth daily as needed (nicotine cravings).     carvedilol (COREG) 6.25 MG tablet TAKE 1 TABLET BY MOUTH TWICE A DAY 180 tablet 3   Cholecalciferol (VITAMIN D-3) 25 MCG (1000 UT) CAPS Take 1,000 Units by mouth daily.     ELIQUIS 5 MG TABS tablet TAKE 1 TABLET BY MOUTH TWICE A DAY 60 tablet 2   loperamide (IMODIUM) 2 MG capsule Take 2 mg by mouth as needed for diarrhea or loose stools.     Magnesium 500 MG CAPS Take 1,000 mg by mouth daily.     Multiple Vitamin (MULTIVITAMIN PO) Take 1 tablet by mouth daily. Centrum     osimertinib mesylate (TAGRISSO) 80 MG tablet Take 1 tablet (80 mg total) by mouth daily. 60 tablet 1   OVER THE COUNTER MEDICATION Take 1 tablet by mouth daily. Standard Process, LivaPlex (Liver supplement)     oxyCODONE ER (XTAMPZA ER) 18 MG C12A Take 18 mg by mouth every 12 (twelve) hours. 60 capsule 0   oxyCODONE-acetaminophen (PERCOCET) 10-325 MG tablet Take 1 tablet by mouth every 4 (four) hours as needed for pain. 90 tablet 0   rosuvastatin (CRESTOR) 5 MG tablet Take 5 mg by mouth daily.  2   torsemide (DEMADEX) 20 MG tablet Take 1 tablet (20 mg total) by mouth 2 (two) times daily. 90 tablet 1   TURMERIC PO Take 1 capsule by mouth daily.     vitamin B-12 (CYANOCOBALAMIN) 1000 MCG tablet Take 1,000 mcg by mouth daily.     No current facility-administered medications for this encounter.    Physical Findings: The  patient is in no acute distress. Patient is alert and oriented.  height is 5\' 7"  (1.702 m) and weight is 170 lb 6 oz (77.3 kg). His temporal temperature is 97.7 F (36.5 C). His blood pressure is 120/50 (abnormal). His respiration is 18 and oxygen saturation is 100%. .   Lungs are clear to auscultation bilaterally. Heart has regular rate and rhythm. No palpable cervical, supraclavicular, or axillary adenopathy. Abdomen soft, non-tender, normal bowel sounds.  Significant edema noted in both lower extremities more so along the right side.   Lab Findings: Lab Results  Component Value Date   WBC 9.2 04/26/2022   HGB 12.2 (L) 04/26/2022   HCT 36.1 (L) 04/26/2022   MCV 95.5 04/26/2022   PLT 209 04/26/2022    Radiographic Findings: CT Chest W Contrast  Result Date: 06/17/2022 CLINICAL DATA:  Metastatic lung cancer restaging * Tracking Code: BO * EXAM: CT CHEST, ABDOMEN, AND PELVIS WITH  CONTRAST TECHNIQUE: Multidetector CT imaging of the chest, abdomen and pelvis was performed following the standard protocol during bolus administration of intravenous contrast. RADIATION DOSE REDUCTION: This exam was performed according to the departmental dose-optimization program which includes automated exposure control, adjustment of the mA and/or kV according to patient size and/or use of iterative reconstruction technique. CONTRAST:  157mL OMNIPAQUE IOHEXOL 300 MG/ML  SOLN COMPARISON:  CT pelvis, 04/27/2022, CT chest abdomen pelvis, 02/26/2022 FINDINGS: CT CHEST FINDINGS Cardiovascular: Aortic atherosclerosis. Normal heart size. Three-vessel coronary calcifications. No pericardial effusion. Mediastinum/Nodes: No enlarged mediastinal, hilar, or axillary lymph nodes. Thyroid gland, trachea, and esophagus demonstrate no significant findings. Lungs/Pleura: Unchanged spiculated mass of the superior segment left lower lobe measuring 3.5 x 2.3 cm (series 4, image 63). Unchanged pulmonary nodules, including a 0.3 cm nodule  of the azygoesophageal recess of the right lower lobe (series 4, image 111) and a 1.5 x 0.6 cm peribronchovascular nodule of the medial segment right middle lobe (series 4, image 67). Minimal paraseptal emphysema with a background of fine centrilobular pulmonary nodularity, most concentrated in the lung apices. No pleural effusion or pneumothorax. Musculoskeletal: No chest wall abnormality. No acute osseous findings. CT ABDOMEN PELVIS FINDINGS Hepatobiliary: No solid liver abnormality is seen. No gallstones, gallbladder wall thickening, or biliary dilatation. Pancreas: Unremarkable. No pancreatic ductal dilatation or surrounding inflammatory changes. Spleen: Normal in size without significant abnormality. Adrenals/Urinary Tract: Adrenal glands are unremarkable. Kidneys are normal, without renal calculi, solid lesion, or hydronephrosis. Diffuse thickening of the urinary bladder wall, likely secondary to chronic outlet obstruction (series 6, image 70). Stomach/Bowel: Stomach is within normal limits. Appendix appears normal. No evidence of bowel wall thickening, distention, or inflammatory changes. Vascular/Lymphatic: Aortic atherosclerosis. Interval enlargement of retroperitoneal lymph nodes, largest left retroperitoneal node measuring 1.8 x 1.5 cm, previously 1.0 x 0.9 cm (series 2, image 68). Reproductive: No mass or other abnormality. Other: No abdominal wall hernia or abnormality. No ascites. Musculoskeletal: No acute osseous findings. When compared to most recent imaging of the pelvis dated 04/27/2022, there has been slight interval decrease in size of a lytic lesion with a large extraosseous soft tissue component of the right iliac crest measuring 3.2 x 2.8 cm, previously 4.9 x 3.8 cm (series 2, image 93). Similar appearance of additional lytic lesions of the medial right acetabulum as well as the right inferior pubic ramus, with subtle evidence of interval post treatment sclerosis (series 2, image 105, 115).  Additional mixed lytic and sclerotic osseous metastatic lesions involving the vertebral bodies and other portions of the pelvis are not significantly changed in comparison to both prior examination dated 02/26/2022 and 04/27/2022. IMPRESSION: 1. Unchanged spiculated mass of the superior segment left lower lobe. 2. Unchanged small pulmonary nodules. 3. When compared to most recent imaging of the pelvis dated 04/27/2022, there has been slight interval decrease in size of a lytic lesion with a large extraosseous soft tissue component of the right iliac crest. Similar appearance of additional lytic lesions of the medial right acetabulum as well as the right inferior pubic ramus, with subtle evidence of interval post treatment sclerosis. Findings are consistent with treatment response to local radiation therapy. 4. Additional treated mixed lytic and sclerotic osseous metastatic lesions involving the vertebral bodies and other portions of the pelvis are not significantly changed in comparison to both prior examination dated 02/26/2022 and 04/27/2022. 5. Interval enlargement of retroperitoneal lymph nodes, concerning for early nodal metastatic disease. PET-CT may be helpful to assess for metabolic activity. 6. Minimal emphysema  with a background of fine centrilobular pulmonary nodularity, most concentrated in the lung apices, consistent with smoking-related respiratory bronchiolitis. 7. Coronary artery disease. Aortic Atherosclerosis (ICD10-I70.0) and Emphysema (ICD10-J43.9). Electronically Signed   By: Delanna Ahmadi M.D.   On: 06/17/2022 14:35   CT Abdomen Pelvis W Contrast  Result Date: 06/17/2022 CLINICAL DATA:  Metastatic lung cancer restaging * Tracking Code: BO * EXAM: CT CHEST, ABDOMEN, AND PELVIS WITH CONTRAST TECHNIQUE: Multidetector CT imaging of the chest, abdomen and pelvis was performed following the standard protocol during bolus administration of intravenous contrast. RADIATION DOSE REDUCTION: This exam  was performed according to the departmental dose-optimization program which includes automated exposure control, adjustment of the mA and/or kV according to patient size and/or use of iterative reconstruction technique. CONTRAST:  156mL OMNIPAQUE IOHEXOL 300 MG/ML  SOLN COMPARISON:  CT pelvis, 04/27/2022, CT chest abdomen pelvis, 02/26/2022 FINDINGS: CT CHEST FINDINGS Cardiovascular: Aortic atherosclerosis. Normal heart size. Three-vessel coronary calcifications. No pericardial effusion. Mediastinum/Nodes: No enlarged mediastinal, hilar, or axillary lymph nodes. Thyroid gland, trachea, and esophagus demonstrate no significant findings. Lungs/Pleura: Unchanged spiculated mass of the superior segment left lower lobe measuring 3.5 x 2.3 cm (series 4, image 63). Unchanged pulmonary nodules, including a 0.3 cm nodule of the azygoesophageal recess of the right lower lobe (series 4, image 111) and a 1.5 x 0.6 cm peribronchovascular nodule of the medial segment right middle lobe (series 4, image 67). Minimal paraseptal emphysema with a background of fine centrilobular pulmonary nodularity, most concentrated in the lung apices. No pleural effusion or pneumothorax. Musculoskeletal: No chest wall abnormality. No acute osseous findings. CT ABDOMEN PELVIS FINDINGS Hepatobiliary: No solid liver abnormality is seen. No gallstones, gallbladder wall thickening, or biliary dilatation. Pancreas: Unremarkable. No pancreatic ductal dilatation or surrounding inflammatory changes. Spleen: Normal in size without significant abnormality. Adrenals/Urinary Tract: Adrenal glands are unremarkable. Kidneys are normal, without renal calculi, solid lesion, or hydronephrosis. Diffuse thickening of the urinary bladder wall, likely secondary to chronic outlet obstruction (series 6, image 70). Stomach/Bowel: Stomach is within normal limits. Appendix appears normal. No evidence of bowel wall thickening, distention, or inflammatory changes.  Vascular/Lymphatic: Aortic atherosclerosis. Interval enlargement of retroperitoneal lymph nodes, largest left retroperitoneal node measuring 1.8 x 1.5 cm, previously 1.0 x 0.9 cm (series 2, image 68). Reproductive: No mass or other abnormality. Other: No abdominal wall hernia or abnormality. No ascites. Musculoskeletal: No acute osseous findings. When compared to most recent imaging of the pelvis dated 04/27/2022, there has been slight interval decrease in size of a lytic lesion with a large extraosseous soft tissue component of the right iliac crest measuring 3.2 x 2.8 cm, previously 4.9 x 3.8 cm (series 2, image 93). Similar appearance of additional lytic lesions of the medial right acetabulum as well as the right inferior pubic ramus, with subtle evidence of interval post treatment sclerosis (series 2, image 105, 115). Additional mixed lytic and sclerotic osseous metastatic lesions involving the vertebral bodies and other portions of the pelvis are not significantly changed in comparison to both prior examination dated 02/26/2022 and 04/27/2022. IMPRESSION: 1. Unchanged spiculated mass of the superior segment left lower lobe. 2. Unchanged small pulmonary nodules. 3. When compared to most recent imaging of the pelvis dated 04/27/2022, there has been slight interval decrease in size of a lytic lesion with a large extraosseous soft tissue component of the right iliac crest. Similar appearance of additional lytic lesions of the medial right acetabulum as well as the right inferior pubic ramus, with subtle evidence of  interval post treatment sclerosis. Findings are consistent with treatment response to local radiation therapy. 4. Additional treated mixed lytic and sclerotic osseous metastatic lesions involving the vertebral bodies and other portions of the pelvis are not significantly changed in comparison to both prior examination dated 02/26/2022 and 04/27/2022. 5. Interval enlargement of retroperitoneal lymph  nodes, concerning for early nodal metastatic disease. PET-CT may be helpful to assess for metabolic activity. 6. Minimal emphysema with a background of fine centrilobular pulmonary nodularity, most concentrated in the lung apices, consistent with smoking-related respiratory bronchiolitis. 7. Coronary artery disease. Aortic Atherosclerosis (ICD10-I70.0) and Emphysema (ICD10-J43.9). Electronically Signed   By: Delanna Ahmadi M.D.   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 A diagnosis of Adenocarcinoma of left lung, stage 4 (HCC) was also pertinent to this visit.   Increasing right hip pain associated with new lytic destructive lesions of the right iliac wing and right inferior pubic ramus (October 2023)   Stage IV (T3, N0, M1c) non-small cell lung cancer, adenocarcinoma: presented with cavitary left lower lobe lung mass in addition to other pulmonary nodules and scattered hypermetabolic osseous metastatic disease involving the spine, ribs and bony pelvis as well as left proximal humeral pathologic fracture.      The patient is recovering from the effects of radiation.  He has had some improvement in his pain the second course of palliative radiation therapy.  Recent scan shows stability with some slight shrinkage.  Plan: As needed follow-up in radiation oncology.  Patient will continue on [place].  He will meet with palliative care later this week and I suggested he discuss potential wrapping of his lower extremities and potential referral.  Physical therapy if indicated.    ____________________________________  Blair Promise, PhD, MD  This document serves as a record of services personally performed by Gery Pray, MD. It was created on his behalf by Roney Mans, a trained medical scribe. The creation of this record is based on the scribe's personal observations and the provider's statements to them. This document has been  checked and approved by the attending provider.

## 2022-06-21 NOTE — Progress Notes (Signed)
Albert Perez is here today for follow up post radiation to the right hip.  They completed their radiation on: 05/19/22.  Does the patient complain of any of the following:  Pain: c/o pain right hip and right leg. Pain at 7 before pain meds Abdominal bloating: no Diarrhea/Constipation: on meds for constipation Nausea/Vomiting: no Urinary Issues (dysuria/incomplete emptying/ incontinence/ increased frequency/urgency): no Post radiation skin changes: no Swelling : patient c/o swelling bilaterally hips down/ with pitting. Patient is taking Torsemide 20mg  twice daily   Additional comments if applicable:

## 2022-06-22 ENCOUNTER — Ambulatory Visit (HOSPITAL_COMMUNITY): Payer: Medicare Other

## 2022-06-22 NOTE — Telephone Encounter (Signed)
We can try increasing his Torsemide to 40mg  twice daily. Would also recommend he start potassium chloride 10 mEq once daily with this. Will then need to repeat a BMET in 1 week to make sure his kidney function and electrolytes are stable.   I am not convinced that this is coming entirely from his heart. Looks like his Echo got rescheduled for 07/23/2022 so we will wait to see what this shows. Would also recommend he follow-up with his Oncologist to see if they recommend anything else. If he is still having redness around his right ankle, he also needs to follow-up with his PCP to make sure he does not have cellulitis.  Thank you! Albert Perez

## 2022-06-23 ENCOUNTER — Ambulatory Visit: Payer: Medicare Other | Admitting: Physician Assistant

## 2022-06-23 ENCOUNTER — Other Ambulatory Visit: Payer: Self-pay | Admitting: Medical Oncology

## 2022-06-23 ENCOUNTER — Inpatient Hospital Stay (HOSPITAL_BASED_OUTPATIENT_CLINIC_OR_DEPARTMENT_OTHER): Payer: Medicare Other | Admitting: Nurse Practitioner

## 2022-06-23 ENCOUNTER — Other Ambulatory Visit (HOSPITAL_COMMUNITY): Payer: Self-pay

## 2022-06-23 ENCOUNTER — Encounter: Payer: Self-pay | Admitting: Nurse Practitioner

## 2022-06-23 ENCOUNTER — Inpatient Hospital Stay: Payer: Medicare Other | Attending: Internal Medicine | Admitting: Physician Assistant

## 2022-06-23 ENCOUNTER — Telehealth: Payer: Self-pay | Admitting: Student

## 2022-06-23 ENCOUNTER — Inpatient Hospital Stay: Payer: Medicare Other

## 2022-06-23 VITALS — BP 118/39 | HR 61 | Temp 97.1°F | Resp 14 | Wt 166.1 lb

## 2022-06-23 DIAGNOSIS — M7989 Other specified soft tissue disorders: Secondary | ICD-10-CM

## 2022-06-23 DIAGNOSIS — C3432 Malignant neoplasm of lower lobe, left bronchus or lung: Secondary | ICD-10-CM | POA: Diagnosis not present

## 2022-06-23 DIAGNOSIS — C7951 Secondary malignant neoplasm of bone: Secondary | ICD-10-CM | POA: Diagnosis not present

## 2022-06-23 DIAGNOSIS — K5900 Constipation, unspecified: Secondary | ICD-10-CM

## 2022-06-23 DIAGNOSIS — Z86718 Personal history of other venous thrombosis and embolism: Secondary | ICD-10-CM | POA: Insufficient documentation

## 2022-06-23 DIAGNOSIS — C3492 Malignant neoplasm of unspecified part of left bronchus or lung: Secondary | ICD-10-CM | POA: Diagnosis not present

## 2022-06-23 DIAGNOSIS — R530 Neoplastic (malignant) related fatigue: Secondary | ICD-10-CM | POA: Diagnosis not present

## 2022-06-23 DIAGNOSIS — C3490 Malignant neoplasm of unspecified part of unspecified bronchus or lung: Secondary | ICD-10-CM

## 2022-06-23 DIAGNOSIS — G893 Neoplasm related pain (acute) (chronic): Secondary | ICD-10-CM

## 2022-06-23 DIAGNOSIS — Z7901 Long term (current) use of anticoagulants: Secondary | ICD-10-CM | POA: Insufficient documentation

## 2022-06-23 DIAGNOSIS — Z86711 Personal history of pulmonary embolism: Secondary | ICD-10-CM | POA: Diagnosis not present

## 2022-06-23 DIAGNOSIS — Z79899 Other long term (current) drug therapy: Secondary | ICD-10-CM | POA: Insufficient documentation

## 2022-06-23 DIAGNOSIS — I4891 Unspecified atrial fibrillation: Secondary | ICD-10-CM | POA: Insufficient documentation

## 2022-06-23 DIAGNOSIS — Z923 Personal history of irradiation: Secondary | ICD-10-CM | POA: Diagnosis not present

## 2022-06-23 DIAGNOSIS — I10 Essential (primary) hypertension: Secondary | ICD-10-CM

## 2022-06-23 DIAGNOSIS — Z515 Encounter for palliative care: Secondary | ICD-10-CM

## 2022-06-23 LAB — CBC WITH DIFFERENTIAL (CANCER CENTER ONLY)
Abs Immature Granulocytes: 0.02 10*3/uL (ref 0.00–0.07)
Basophils Absolute: 0 10*3/uL (ref 0.0–0.1)
Basophils Relative: 1 %
Eosinophils Absolute: 0.5 10*3/uL (ref 0.0–0.5)
Eosinophils Relative: 7 %
HCT: 31.5 % — ABNORMAL LOW (ref 39.0–52.0)
Hemoglobin: 10.7 g/dL — ABNORMAL LOW (ref 13.0–17.0)
Immature Granulocytes: 0 %
Lymphocytes Relative: 14 %
Lymphs Abs: 1 10*3/uL (ref 0.7–4.0)
MCH: 31.2 pg (ref 26.0–34.0)
MCHC: 34 g/dL (ref 30.0–36.0)
MCV: 91.8 fL (ref 80.0–100.0)
Monocytes Absolute: 1 10*3/uL (ref 0.1–1.0)
Monocytes Relative: 15 %
Neutro Abs: 4.4 10*3/uL (ref 1.7–7.7)
Neutrophils Relative %: 63 %
Platelet Count: 245 10*3/uL (ref 150–400)
RBC: 3.43 MIL/uL — ABNORMAL LOW (ref 4.22–5.81)
RDW: 13.8 % (ref 11.5–15.5)
WBC Count: 6.9 10*3/uL (ref 4.0–10.5)
nRBC: 0 % (ref 0.0–0.2)

## 2022-06-23 LAB — CMP (CANCER CENTER ONLY)
ALT: 23 U/L (ref 0–44)
AST: 20 U/L (ref 15–41)
Albumin: 3.6 g/dL (ref 3.5–5.0)
Alkaline Phosphatase: 96 U/L (ref 38–126)
Anion gap: 7 (ref 5–15)
BUN: 23 mg/dL (ref 8–23)
CO2: 30 mmol/L (ref 22–32)
Calcium: 8.9 mg/dL (ref 8.9–10.3)
Chloride: 99 mmol/L (ref 98–111)
Creatinine: 1.32 mg/dL — ABNORMAL HIGH (ref 0.61–1.24)
GFR, Estimated: 57 mL/min — ABNORMAL LOW (ref >60)
Glucose, Bld: 117 mg/dL — ABNORMAL HIGH (ref 70–99)
Potassium: 4.4 mmol/L (ref 3.5–5.1)
Sodium: 136 mmol/L (ref 135–145)
Total Bilirubin: 0.5 mg/dL (ref 0.3–1.2)
Total Protein: 7.1 g/dL (ref 6.5–8.1)

## 2022-06-23 MED ORDER — POTASSIUM CHLORIDE ER 10 MEQ PO TBCR: 10.0000 meq | EXTENDED_RELEASE_TABLET | Freq: Every day | ORAL | 1 refills | Status: DC

## 2022-06-23 MED ORDER — OSIMERTINIB MESYLATE 80 MG PO TABS
80.0000 mg | ORAL_TABLET | Freq: Every day | ORAL | 3 refills | Status: DC
  Filled 2022-06-23: qty 60, 60d supply, fill #0
  Filled 2022-06-24: qty 30, 30d supply, fill #0
  Filled 2022-10-04: qty 30, 30d supply, fill #1

## 2022-06-23 MED ORDER — OXYCODONE-ACETAMINOPHEN 10-325 MG PO TABS
1.0000 | ORAL_TABLET | ORAL | 0 refills | Status: DC | PRN
  Filled 2022-06-23 – 2022-06-24 (×2): qty 90, 15d supply, fill #0

## 2022-06-23 MED ORDER — TORSEMIDE 20 MG PO TABS: 40.0000 mg | ORAL_TABLET | Freq: Two times a day (BID) | ORAL | 1 refills | Status: DC

## 2022-06-23 MED ORDER — XTAMPZA ER 18 MG PO C12A
1.0000 | EXTENDED_RELEASE_CAPSULE | Freq: Two times a day (BID) | ORAL | 0 refills | Status: DC
  Filled 2022-06-23 – 2022-07-02 (×3): qty 60, 30d supply, fill #0

## 2022-06-23 NOTE — Telephone Encounter (Signed)
Albert Mclean, PA-C     06/22/22  8:05 PM Note We can try increasing his Torsemide to 40mg  twice daily. Would also recommend he start potassium chloride 10 mEq once daily with this. Will then need to repeat a BMET in 1 week to make sure his kidney function and electrolytes are stable.    I am not convinced that this is coming entirely from his heart. Looks like his Echo got rescheduled for 07/23/2022 so we will wait to see what this shows. Would also recommend he follow-up with his Oncologist to see if they recommend anything else. If he is still having redness around his right ankle, he also needs to follow-up with his PCP to make sure he does not have cellulitis.   Thank you! Callie

## 2022-06-23 NOTE — Telephone Encounter (Signed)
Fidel Levy, RN   06/23/22  2:14 PM Note Patient called with advice from [facility]. He voiced understanding. Rx(s) sent to pharmacy electronically. BMET ordered. MyChart message also sent .

## 2022-06-23 NOTE — Progress Notes (Addendum)
[facility]  [phone] [phone]   Name: [name] Date: 06/23/2022 MRN: [id number]  DOB: 12/22/1950  Patient Care Team: Aura Dials, MD as PCP - General (Family Medicine) Constance Haw, MD as PCP - Electrophysiology (Cardiology) Lorretta Harp, MD as PCP - Cardiology (Cardiology) Gentry Fitz, MD as Consulting Physician (Orthopedic Surgery) Pickenpack-Cousar, [name], NP as Nurse Practitioner (Nurse Practitioner)    INTERVAL HISTORY: Albert Perez is a 71 y.o. 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 Recent scans  Palliative ask to see for.  Palliative ask to see for symptom management.  SOCIAL HISTORY:     reports that he has been smoking cigarettes. He has a 7.50 pack-year smoking history. He has never used smokeless tobacco. He reports current alcohol use of about 2.0 standard drinks of alcohol per week. He reports current drug use. Drug: Marijuana.  ADVANCE DIRECTIVES:    CODE STATUS:   PAST MEDICAL HISTORY: Past Medical History:  Diagnosis Date   A-fib [facility])    PMH: 2000-2001   Arthritis    Cancer ([place])    melanoma 2018   Diarrhea    due to medications   DVT (deep venous thrombosis) (HCC)    Left brachial vein DVT (01/30/19, following fracture)   Dysrhythmia    afib -    ETOH abuse    GERD (gastroesophageal reflux disease)    ocassional   History of radiation therapy    Right Hip 02/08/22-02/19/22-Dr. Gery Pray   History of radiation therapy    Right Pelvis-05/06/22-05/19/22- Dr. Gery Pray   Lung cancer [facility]) dx'd 02/26/2019   Metastatic cancer to bone ([place]) dx'd 02/26/2019   Sleep apnea    do not wear CPAP   Stroke ([place]) 2019   "  mini"   Vertigo    Wears glasses     ALLERGIES:  has No Known Allergies.  MEDICATIONS:  Current Outpatient Medications  Medication Sig Dispense Refill   buPROPion (WELLBUTRIN XL) 150 MG 24 hr tablet Take 150 mg by mouth daily as needed (nicotine cravings).     carvedilol (COREG) 6.25 MG tablet TAKE 1 TABLET BY MOUTH TWICE A DAY 180 tablet 3   Cholecalciferol (VITAMIN D-3) 25 MCG (1000 UT) CAPS Take 1,000 Units by mouth daily.     ELIQUIS 5 MG TABS tablet TAKE 1 TABLET BY MOUTH TWICE A DAY 60 tablet 2   loperamide (IMODIUM) 2 MG capsule Take 2 mg by mouth as needed for diarrhea or loose stools.     Magnesium 500 MG CAPS Take 1,000 mg by mouth daily.     Multiple Vitamin (MULTIVITAMIN PO) Take 1 tablet by mouth daily. Centrum     osimertinib mesylate (TAGRISSO) 80 MG tablet Take 1 tablet (80 mg total) by mouth daily. 60 tablet 3   OVER THE COUNTER MEDICATION Take 1 tablet by mouth daily. Standard Process, LivaPlex (Liver supplement)     oxyCODONE ER (XTAMPZA ER) 18 MG C12A Take 18 mg by mouth every 12 (twelve) hours. 60 capsule 0   oxyCODONE-acetaminophen (PERCOCET) 10-325 MG tablet Take 1 tablet by mouth every 4 (four) hours as needed for pain. 90 tablet 0   potassium chloride (KLOR-CON) 10 MEQ tablet Take 1 tablet (10 mEq total) by mouth daily. 90 tablet  1   rosuvastatin (CRESTOR) 5 MG tablet Take 5 mg by mouth daily.  2   torsemide (DEMADEX) 20 MG tablet Take 2 tablets (40 mg total) by mouth 2 (two) times daily. 180 tablet 1   TURMERIC PO Take 1 capsule by mouth daily.     vitamin B-12 (CYANOCOBALAMIN) 1000 MCG tablet Take 1,000 mcg by mouth daily.     No current facility-administered medications for this visit.    VITAL SIGNS: There were no vitals taken for this visit. There were no vitals filed for this visit.   Estimated body mass index is 26.01 kg/m as calculated from the following:   Height as of 06/21/22: 5\' 7"  (1.702 m).   Weight as of an earlier encounter on 06/23/22:  166 lb 1.6 oz (75.3 kg).  Assessment NAD Normal breathing pattern RRR Bilateral lower extremity edema AAO x3  PERFORMANCE STATUS (ECOG) : 1 - Symptomatic but completely ambulatory  IMPRESSION:  [name] presented to clinic today for symptom management follow-up. Continues to have bilateral lower extremity edema which he reports causes and increase to his pain. He is being followed by his PCP and states torsemide was recently adjusted.  He is ambulating with a walker.   Denies nausea, vomiting, constipation, or diarrhea.   Sevin endorses increase in his pain depending on his level of activity.   We discussed current pain regimen at length. Mr. Pott is taking oxycodone 10 mg every 4 hours as needed for pain. Tolerating Xtampza 18mg . He is taking medications as prescribed per patient and wife.   We will continue to closely monitor and adjust as needed.   PLAN: Oxycodone 10 mg every 4-6 hours as needed for breakthrough pain Xtampza 18 mg every 12 hours  Miralax daily for bowel regimen  Toradol 30 mg IM today We will continue to closely monitor and adjust as needed. I will plan to see patient back in 3-4 weeks in collaboration with his other oncology/radiation appointments.  He knows to contact our office sooner if needed.   Patient expressed understanding and was in agreement with this plan. He also understands that He can call the clinic at any time with any questions, concerns, or complaints.    Any controlled substances utilized were prescribed in the context of palliative care. PDMP has been reviewed.    Time Total: 45 min   Visit consisted of counseling and education dealing with the complex and emotionally intense issues of symptom management and palliative care in the setting of serious and potentially life-threatening illness.Greater than 50%  of this time was spent counseling and coordinating care related to the above assessment and plan.  Alda Lea,  AGPCNP-BC  Palliative Medicine Team/[place]

## 2022-06-23 NOTE — Telephone Encounter (Signed)
Patient is returning call about his MyChart message.  He is getting ready to head out for another doctor's appt, he said a detailed message can be left on his VM.

## 2022-06-23 NOTE — Telephone Encounter (Signed)
Patient called with advice from [facility] He voiced understanding.  Rx(s) sent to pharmacy electronically  BMET ordered MyChart message also sent

## 2022-06-24 ENCOUNTER — Other Ambulatory Visit (HOSPITAL_COMMUNITY): Payer: Self-pay

## 2022-06-24 ENCOUNTER — Telehealth: Payer: Self-pay

## 2022-06-24 ENCOUNTER — Encounter: Payer: Self-pay | Admitting: Internal Medicine

## 2022-06-24 ENCOUNTER — Other Ambulatory Visit: Payer: Self-pay

## 2022-06-24 NOTE — Telephone Encounter (Signed)
Pt wife called back stating that pharmacy would not "give his pills until christmas" this RN called pharmacy and learned that it is too soon for the patient to pick up the xtampza, it is time to pick up the percocet and the pharmacy does have it in stock. Pt wife called back by this RN, pill count confirmed with pt wife, pt has enough xtampza to last until medication is due and pt wife informed that she can pick up percocet. Pt wife verbalized understanding, no further needs at this point.

## 2022-06-24 NOTE — Telephone Encounter (Signed)
Pt wofe called regarding script refill, pt wife was going to incorrect pharmacy, scripts sent to [facility], no further concerns at this time.

## 2022-06-26 DIAGNOSIS — I87301 Chronic venous hypertension (idiopathic) without complications of right lower extremity: Secondary | ICD-10-CM | POA: Diagnosis not present

## 2022-06-26 DIAGNOSIS — L03115 Cellulitis of right lower limb: Secondary | ICD-10-CM | POA: Diagnosis not present

## 2022-06-26 DIAGNOSIS — R600 Localized edema: Secondary | ICD-10-CM | POA: Diagnosis not present

## 2022-06-29 NOTE — Therapy (Addendum)
OUTPATIENT PHYSICAL THERAPY ONCOLOGY EVALUATION  Patient Name: Albert Perez MRN: [id number] DOB:10-Dec-1950, 71 y.o., male Today's Date: 06/30/2022  END OF SESSION:  PT End of Session - 06/30/22 0852     Visit Number 1    Number of Visits 18    Date for PT Re-Evaluation 08/11/22    PT Start Time 0854    PT Stop Time 1005    PT Time Calculation (min) 71 min    Activity Tolerance Treatment limited secondary to medical complications (Comment)    Behavior During Therapy [facility] for tasks assessed/performed             Past Medical History:  Diagnosis Date   A-fib [facility])    PMH: 2000-2001   Arthritis    Cancer (HCC)    melanoma 2018   Diarrhea    due to medications   DVT (deep venous thrombosis) (HCC)    Left brachial vein DVT (01/30/19, following fracture)   Dysrhythmia    afib -    ETOH abuse    GERD (gastroesophageal reflux disease)    ocassional   History of radiation therapy    Right Hip 02/08/22-02/19/22-Dr. Antony Blackbird   History of radiation therapy    Right Pelvis-05/06/22-05/19/22- Dr. Antony Blackbird   Lung cancer [facility]) dx'd 02/26/2019   Metastatic cancer to bone (HCC) dx'd 02/26/2019   Sleep apnea    do not wear CPAP   Stroke (HCC) 2019   " mini"   Vertigo    Wears glasses    Past Surgical History:  Procedure Laterality Date   ANKLE FRACTURE SURGERY Right 2003   COLONOSCOPY  2018   ELBOW SURGERY Left 1964   INGUINAL HERNIA REPAIR Bilateral 11/12/2020   Procedure: BILATERAL OPEN INGUINAL HERNIA REPAIR WITH MESH;  Surgeon: Abigail Miyamoto, MD;  Location: [facility] OR;  Service: General;  Laterality: Bilateral;   melanoma removal  04/2017   Lt shoulder   RETINAL DETACHMENT SURGERY Right 1971   TONSILLECTOMY     VIDEO BRONCHOSCOPY WITH ENDOBRONCHIAL NAVIGATION N/A 03/28/2019   Procedure: VIDEO BRONCHOSCOPY WITH ENDOBRONCHIAL NAVIGATION and Biopsies;  Surgeon: Leslye Peer, MD;  Location: [facility] OR;  Service: Thoracic;  Laterality: N/A;   Patient Active Problem List    Diagnosis Date Noted   Swelling of lower extremity 06/23/2022   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) 03/01/2022   Pulmonary emboli (HCC) 11/27/2020   Hyperlipidemia 12/05/2019   Elevated coronary artery calcium score 12/05/2019   Tobacco abuse 06/14/2019   SVT (supraventricular tachycardia) 06/14/2019   Adenocarcinoma of left lung, stage 4 (HCC) 04/06/2019   Goals of care, counseling/discussion 04/06/2019   Encounter for antineoplastic chemotherapy 04/06/2019   Mass of lower lobe of left lung 03/27/2019   Severe obstructive sleep apnea-hypopnea syndrome 08/18/2018   History of atrial fibrillation 07/19/2018   Snoring 07/19/2018   Essential hypertension 07/19/2018   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 Pt had radiation to the right hip and pelvic area.  Swelling is now bilateral. His legs started seeping also and they went to urgent care 5 days ago and they did an unna boot with antibiotic wrap and a white wrap. No problems breathing, but it was not comfortable for him. He will see the PCP today. Tried to have an echocardiogram but he couldn't lie on the flat table and it was rescheduled to 07/23/2021. Using a walker due to pain in hip and decreased balance, Have not doubled the torsemide yet because 40 mg tablet isn't available. Suggested he call and see if he can  take 2 (40 mg) tablets. Pts legs remain in a dependent position most of the day because he can not get his right hip comfortable when he elevates his legs  PERTINENT HISTORY:  Pt has stage IV non-small cell lung cancer, adenocarcinoma with  left lower lobe lung mass in addition to other pulmonary nodules and scattered hypermetabolic osseous metastatic disease involving the spines, ribs and bony pelvis with left proximal humeral pathologic fracture diagnosed 02/26/2019  He is status post palliative radiotherapy to the metastatic disease in the left arm  in October 2020. He had palliative radiation to the right hip due to a new lesion under and this was completed on 02/08/22- 02/19/2022 and right pelvis on  05/19/2022. He takes Eliquis for his PE      PAIN:  Are you having pain? Yes NPRS scale: 5/10 Pain location: Right hip/groin, right gluts Pain orientation: Right  PAIN TYPE: aching and sharp Pain description: constant  Aggravating factors: elevating Right leg causes pain in hip, no meds Relieving factors: pain meds.  PRECAUTIONS: Active lung Cancer with bone METS, Being treated for PE with Eliquis, prior Mini stroke, Afib, CAD, prior surgery for Back Melanoma with SLNB x 5  WEIGHT BEARING RESTRICTIONS: Nobut uses a walker for pain and decreased balance since September  FALLS:  Has patient fallen in last 6 months? 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  SENSATION: Light touch:  Appears intact  POSTURE: forward head, rounded shoulders CERVICAL AROM: All within normal limits:     LOWER EXTREMITY MMT: NT  LYMPHEDEMA ASSESSMENTS:   SURGERY TYPE/DATE: NA  NUMBER OF LYMPH NODES REMOVED: NA  CHEMOTHERAPY: NA  RADIATION:yes to left prox humerus, Right hip/pelvic area  HORMONE TREATMENT: NO  INFECTIONS: NO  LYMPHEDEMA ASSESSMENTS:   LOWER EXTREMITY LANDMARK RIGHT eval  At groin   30 cm proximal to suprapatella   20 cm proximal  to suprapatella 55.2  10 cm proximal to suprapatella 51.7  At midpatella / popliteal crease 46.5  30 cm proximal to floor at lateral plantar foot 41.0  20 cm proximal to floor at lateral plantar foot 34.8  10 cm proximal to floor at lateral plantar foot 29.5  Circumference of ankle/heel   5 cm proximal to 1st MTP joint   Across MTP joint 25.5  Around proximal great toe 9.0  (Blank rows = not tested)  LOWER EXTREMITY LANDMARK LEFT eval  At groin   30 cm proximal to suprapatella   20 cm proximal to suprapatella 49.5  10 cm proximal to suprapatella 47.1  At midpatella / popliteal crease 45  30 cm proximal to floor at lateral plantar foot 41  20 cm proximal to floor at lateral plantar foot 33.3  10 cm proximal to floor at lateral plantar foot 30.0  Circumference of ankle/heel   5 cm proximal to 1st MTP joint   Across MTP joint 17.5  Around proximal great toe 9.5  (Blank rows = not tested)  FUNCTIONAL TESTS:  Not performed due to METS  GAIT: Distance walked: 40 ft Assistive device utilized: Walker - 2 wheeled Level of  assistance: Complete Independence Comments: slow, labored   QUICK DASH SURVEY: NA  LLIS:  76.47  TODAY'S TREATMENT:                                                                                                                                         DATE:  06/30/2022 No treatment performed today. Will see his PCP this afternoon and have Unna boots removed. Discussed concern about not really knowing what is causing his swelling, although it is likely worse due to his staying in a dependent position all day. Advised pt to ask MD if there is anything that can be done for pain relief for his right hip so he can get his legs elevated. Discussed option of home health nursing so he can get unna boots at home since he is having difficulty getting around, but also discussed possibility of therapy here and possible velcro wraps if the lymph leakage is controlled. His mobility and difficulty getting around is a limiting factor and he might be better served with HHPT. We will touch base after he sees PCP this afternoon. 06/30/2022 4:45 Pts wife called. They removed the unna boots at the PCP. Right LE started weeping slightly again. They rewrapped with antibiotic cream, gauze and what sounded like an ace wrap. Pts. Wife [name] will tend to his legs at home to see how she does. If not improving will consider getting HH nursing involved.  PATIENT EDUCATION:  Education details: concern for reason of swelling, unna boot versus velcro wrap, HHPT vs outpt Person educated: Patient and Spouse Education method: Explanation Education comprehension: verbalized understanding  HOME EXERCISE PROGRAM: None given today  ASSESSMENT:  CLINICAL IMPRESSION: Patient  is a 71 y.o. male who was seen today for physical therapy evaluation and treatment for Bilateral LE swelling which started around May 11, 2022 . Pt has co-morbidities of Active lung Cancer with bone METS, Being treated for PE with Eliquis, prior Mini  stroke, Afib, CAD, prior surgery for Back Melanoma with SLNB x 5. He is in a tremendous amt of pain with his hip despite palliative radiation on that side, and he is unable to elevate his legs due to this.  He had substantial lymph leakage  on the right and was seen at [facility] where they applied Unna boots bilaterally. He will have them cut off today at his PCP. We discussed concern for why is legs are swelling, but lack of elevation could be key. Discussed HHPT/unna boots Vs. Velcro wraps and OPPT if lymph leakage is reduced. Pts wife indicates it is very difficult for him to leave the home, therefor HHPT might be best option. Pain control for his right hip would be beneficial also.  OBJECTIVE IMPAIRMENTS: Abnormal gait, decreased activity tolerance, decreased balance, decreased knowledge of condition, difficulty walking, increased edema, and pain.   ACTIVITY LIMITATIONS: standing, sleeping, stairs, bed mobility, and locomotion level  PARTICIPATION LIMITATIONS: cleaning, laundry, interpersonal relationship, driving, shopping, community activity, occupation, and yard work  PERSONAL FACTORS: Time since onset of injury/illness/exacerbation and 3+ comorbidities: Stage IV lung CA with mets, AFIB, CAD, mini stroke, pain  are also affecting patient's functional outcome.   REHAB POTENTIAL: Good  CLINICAL DECISION MAKING: Evolving/moderate complexity  EVALUATION COMPLEXITY: Moderate  GOALS: Goals reviewed with patient? Yes LONG TERM GOALS: Target date: 08/11/2021  Pt will have decreased lymphatic leakage to enable him to wear velcro wraps or compression stockings Baseline:  Goal status: INITIAL  2.  Pt will be fit for most appropriate compression garments and will be able to don with wife's assistance Baseline:  Goal status: INITIAL  3.  Pt will tolerate wrapping/velcro wraps for control of swelling Baseline:  Goal status: INITIAL    PLAN:  PT FREQUENCY: 3x/week if determined to be in pts best  interest  PT DURATION: 6 weeks  PLANNED INTERVENTIONS: Patient/Family education, Self Care, Orthotic/Fit training, Manual lymph drainage, Compression bandaging, Manual therapy, and Re-evaluation  PLAN FOR NEXT SESSION: will converse with pt after PCP appt and will need clearance from cardiologist. If OPPT deemed appropriate will consider wrapping LE's vs Velcro wrap. Pt can not lie supine and would need to be wrapped sitting, MLD would be difficult PHYSICAL THERAPY DISCHARGE SUMMARY  Visits from Start of Care: 1  Current functional level related to goals / functional outcomes: Unknown. Pt did not return. OPPT was deemed difficult for pt  due to pain and poor mobility .After conversing with he and his wife it was determined he might be better served with HHPT nursing to provide Unna boot or wrapping to decrease leg swelling and instruct pts wife.   Remaining deficits: Unknown.   Education / Equipment: None given   Patient agrees to discharge. Patient goals were not met. Patient is being discharged due to   . Pt being better served with HHPT  Waynette Buttery, PT 06/30/2022, 5:01 PM

## 2022-06-30 ENCOUNTER — Ambulatory Visit: Payer: Medicare Other | Attending: Physician Assistant

## 2022-06-30 ENCOUNTER — Other Ambulatory Visit: Payer: Self-pay

## 2022-06-30 DIAGNOSIS — C3490 Malignant neoplasm of unspecified part of unspecified bronchus or lung: Secondary | ICD-10-CM | POA: Diagnosis not present

## 2022-06-30 DIAGNOSIS — M7989 Other specified soft tissue disorders: Secondary | ICD-10-CM | POA: Diagnosis not present

## 2022-06-30 DIAGNOSIS — I872 Venous insufficiency (chronic) (peripheral): Secondary | ICD-10-CM | POA: Diagnosis not present

## 2022-07-01 ENCOUNTER — Other Ambulatory Visit: Payer: Self-pay

## 2022-07-01 ENCOUNTER — Telehealth: Payer: Self-pay | Admitting: Student

## 2022-07-01 ENCOUNTER — Other Ambulatory Visit (HOSPITAL_COMMUNITY): Payer: Self-pay

## 2022-07-01 DIAGNOSIS — M7989 Other specified soft tissue disorders: Secondary | ICD-10-CM

## 2022-07-01 NOTE — Telephone Encounter (Signed)
Spoke with wife who stated pharmacy does not have torsemide 40mg  tablets. Recommended that patient take two 20mg  tablets in the morning and in the evening to total 80mg /day (per phone note on 12/20). Wife stated patient has only been taking torsemide 20mg . Explained the importance of taking torsemide as prescribed due to taking KCL 54mEq daily. She verbalized understanding. Wife will check with her pharmacy next week to see if the 40mg  tablets came in or if a refill for the 20mg  tablets needs to be sent. Echo is scheduled for 1/19. F/U appointment for 1/31 with Melvenia Needles.

## 2022-07-01 NOTE — Telephone Encounter (Signed)
Pt c/o medication issue:  1. Name of Medication:  torsemide (DEMADEX) 40 MG tablet   2. How are you currently taking this medication (dosage and times per day)? Has not started  3. Are you having a reaction (difficulty breathing--STAT)? no  4. What is your medication issue? Patient's wife states the patient's prescription was increased to 40 mg tablets daily. She says his pharmacy said the medication is on back order. She would like to know if the patient can just take 2 tablets of the 20 mg daily. She also would like to know if he still needs to keep his appointment 07/07/2022 since he could not have his echo done.

## 2022-07-02 ENCOUNTER — Telehealth: Payer: Self-pay

## 2022-07-02 ENCOUNTER — Other Ambulatory Visit (HOSPITAL_COMMUNITY): Payer: Self-pay

## 2022-07-02 NOTE — Telephone Encounter (Signed)
Pt wife called for a refill on xtampza, refill previously sent and post-dated for pt to pick up. Script is still at the pharmacy, pt and wife notified, no further needs at this time.

## 2022-07-06 ENCOUNTER — Encounter: Payer: Self-pay | Admitting: Internal Medicine

## 2022-07-07 ENCOUNTER — Ambulatory Visit: Payer: Medicare Other | Admitting: Physician Assistant

## 2022-07-07 ENCOUNTER — Telehealth: Payer: Self-pay | Admitting: Cardiovascular Disease

## 2022-07-07 ENCOUNTER — Encounter: Payer: Self-pay | Admitting: Medical Oncology

## 2022-07-07 ENCOUNTER — Telehealth: Payer: Self-pay

## 2022-07-07 NOTE — Telephone Encounter (Signed)
Patient states that he was on the phone with the nurse but got disconnect. Was calling about his leg swelling. Please advise

## 2022-07-07 NOTE — Telephone Encounter (Signed)
Called patient to schedule an appointment per Dr Kennon Holter recommendation. Patient scheduled for an appointment with Dr Martinique (DOD) tomorrow 07/08/22 at 1130.  Also made patient aware of his appointments at [facility]. He verbalized understanding.

## 2022-07-07 NOTE — Telephone Encounter (Signed)
Spoke with patient at length (called at 0830, got disconnected, called pt back) in regards to MyChart message about no change in swelling.He says in spite of changes made over a week ago, there has been no change and he was also seen by his PCP last week. Pt reports no SOB, BP's have been normal, not light-headed. He reports that he spoke with Claris Pong, PT about Home Health to help with care due to significant swelling. Patient will call and speak with PCP and/or Oncologist about setting up IXL. Patient wants to be sure that there is not a "Cardiac Reason" for all this swelling. All this swelling makes it hard to even move and can hardly sleep due to the discomfort. Cannot lift right leg up. Has to sleep in a recliner with legs elevated and sitting up. Has extreme pain when tries to lay flat. Has an Echo scheduled 07/23/22, and in current condition, he would not be able to lay flat for it. He is wondering if he can/needs to be seen here/appointment. Told patient that I will forward this information to his provider and be back in touch soon.

## 2022-07-07 NOTE — Telephone Encounter (Signed)
Spoke at length with pt. Still having significant bilateral LE swelling and right hip pain. He is unable to elevate his legs due to right hip pain. He has difficulty getting around and OPPT would be difficult for him. Discussed pt speaking with PCP and Dr. Mora Appl about HHPT with unna boot to control swelling and possible instruction to wife for wrapping. Expressed continued concern about reason for swelling and cardiac not yet ruled out. Pt will follow up with MD and will keep in touch with me to let me know what they decide.

## 2022-07-08 ENCOUNTER — Other Ambulatory Visit: Payer: Self-pay

## 2022-07-08 ENCOUNTER — Encounter: Payer: Self-pay | Admitting: Nurse Practitioner

## 2022-07-08 ENCOUNTER — Inpatient Hospital Stay: Payer: Medicare Other | Attending: Internal Medicine | Admitting: Internal Medicine

## 2022-07-08 ENCOUNTER — Ambulatory Visit (HOSPITAL_COMMUNITY): Admission: RE | Admit: 2022-07-08 | Payer: Medicare Other | Source: Ambulatory Visit

## 2022-07-08 ENCOUNTER — Inpatient Hospital Stay: Payer: Medicare Other

## 2022-07-08 ENCOUNTER — Ambulatory Visit: Payer: Medicare Other | Admitting: Cardiology

## 2022-07-08 ENCOUNTER — Inpatient Hospital Stay (HOSPITAL_BASED_OUTPATIENT_CLINIC_OR_DEPARTMENT_OTHER): Payer: Medicare Other | Admitting: Nurse Practitioner

## 2022-07-08 ENCOUNTER — Ambulatory Visit (INDEPENDENT_AMBULATORY_CARE_PROVIDER_SITE_OTHER)
Admission: RE | Admit: 2022-07-08 | Discharge: 2022-07-08 | Disposition: A | Discharge status: Home / Self Care | Payer: Medicare Other | Source: Ambulatory Visit | Attending: Cardiology | Admitting: Cardiology

## 2022-07-08 ENCOUNTER — Encounter: Payer: Self-pay | Admitting: Cardiology

## 2022-07-08 VITALS — BP 136/68 | HR 60 | Ht 67.5 in | Wt 170.4 lb

## 2022-07-08 VITALS — BP 131/45 | HR 64 | Temp 97.7°F | Resp 15 | Wt 167.3 lb

## 2022-07-08 DIAGNOSIS — Z515 Encounter for palliative care: Secondary | ICD-10-CM | POA: Diagnosis not present

## 2022-07-08 DIAGNOSIS — R931 Abnormal findings on diagnostic imaging of heart and coronary circulation: Secondary | ICD-10-CM | POA: Insufficient documentation

## 2022-07-08 DIAGNOSIS — C3490 Malignant neoplasm of unspecified part of unspecified bronchus or lung: Secondary | ICD-10-CM | POA: Insufficient documentation

## 2022-07-08 DIAGNOSIS — C3432 Malignant neoplasm of lower lobe, left bronchus or lung: Secondary | ICD-10-CM | POA: Insufficient documentation

## 2022-07-08 DIAGNOSIS — I5032 Chronic diastolic (congestive) heart failure: Secondary | ICD-10-CM | POA: Insufficient documentation

## 2022-07-08 DIAGNOSIS — R530 Neoplastic (malignant) related fatigue: Secondary | ICD-10-CM

## 2022-07-08 DIAGNOSIS — C7951 Secondary malignant neoplasm of bone: Secondary | ICD-10-CM | POA: Insufficient documentation

## 2022-07-08 DIAGNOSIS — Z86718 Personal history of other venous thrombosis and embolism: Secondary | ICD-10-CM | POA: Insufficient documentation

## 2022-07-08 DIAGNOSIS — G893 Neoplasm related pain (acute) (chronic): Secondary | ICD-10-CM | POA: Insufficient documentation

## 2022-07-08 DIAGNOSIS — Z923 Personal history of irradiation: Secondary | ICD-10-CM | POA: Insufficient documentation

## 2022-07-08 DIAGNOSIS — F17210 Nicotine dependence, cigarettes, uncomplicated: Secondary | ICD-10-CM | POA: Insufficient documentation

## 2022-07-08 DIAGNOSIS — R600 Localized edema: Secondary | ICD-10-CM | POA: Insufficient documentation

## 2022-07-08 DIAGNOSIS — I480 Paroxysmal atrial fibrillation: Secondary | ICD-10-CM | POA: Insufficient documentation

## 2022-07-08 DIAGNOSIS — Z7901 Long term (current) use of anticoagulants: Secondary | ICD-10-CM | POA: Insufficient documentation

## 2022-07-08 DIAGNOSIS — Z5111 Encounter for antineoplastic chemotherapy: Secondary | ICD-10-CM

## 2022-07-08 DIAGNOSIS — F1290 Cannabis use, unspecified, uncomplicated: Secondary | ICD-10-CM | POA: Diagnosis not present

## 2022-07-08 DIAGNOSIS — Z79899 Other long term (current) drug therapy: Secondary | ICD-10-CM | POA: Insufficient documentation

## 2022-07-08 DIAGNOSIS — C3492 Malignant neoplasm of unspecified part of left bronchus or lung: Secondary | ICD-10-CM

## 2022-07-08 DIAGNOSIS — K5900 Constipation, unspecified: Secondary | ICD-10-CM

## 2022-07-08 LAB — CBC WITH DIFFERENTIAL/PLATELET
Abs Immature Granulocytes: 0.02 10*3/uL (ref 0.00–0.07)
Basophils Absolute: 0 10*3/uL (ref 0.0–0.1)
Basophils Relative: 0 %
Eosinophils Absolute: 0.5 10*3/uL (ref 0.0–0.5)
Eosinophils Relative: 6 %
HCT: 30.9 % — ABNORMAL LOW (ref 39.0–52.0)
Hemoglobin: 10.4 g/dL — ABNORMAL LOW (ref 13.0–17.0)
Immature Granulocytes: 0 %
Lymphocytes Relative: 14 %
Lymphs Abs: 1.2 10*3/uL (ref 0.7–4.0)
MCH: 30.5 pg (ref 26.0–34.0)
MCHC: 33.7 g/dL (ref 30.0–36.0)
MCV: 90.6 fL (ref 80.0–100.0)
Monocytes Absolute: 1 10*3/uL (ref 0.1–1.0)
Monocytes Relative: 12 %
Neutro Abs: 5.4 10*3/uL (ref 1.7–7.7)
Neutrophils Relative %: 68 %
Platelets: 242 10*3/uL (ref 150–400)
RBC: 3.41 MIL/uL — ABNORMAL LOW (ref 4.22–5.81)
RDW: 14 % (ref 11.5–15.5)
WBC: 8.1 10*3/uL (ref 4.0–10.5)
nRBC: 0 % (ref 0.0–0.2)

## 2022-07-08 LAB — COMPREHENSIVE METABOLIC PANEL
ALT: 15 U/L (ref 0–44)
AST: 16 U/L (ref 15–41)
Albumin: 3.6 g/dL (ref 3.5–5.0)
Alkaline Phosphatase: 100 U/L (ref 38–126)
Anion gap: 7 (ref 5–15)
BUN: 20 mg/dL (ref 8–23)
CO2: 32 mmol/L (ref 22–32)
Calcium: 9.3 mg/dL (ref 8.9–10.3)
Chloride: 99 mmol/L (ref 98–111)
Creatinine, Ser: 1.25 mg/dL — ABNORMAL HIGH (ref 0.61–1.24)
GFR, Estimated: >60 mL/min (ref >60)
Glucose, Bld: 99 mg/dL (ref 70–99)
Potassium: 3.9 mmol/L (ref 3.5–5.1)
Sodium: 138 mmol/L (ref 135–145)
Total Bilirubin: 0.4 mg/dL (ref 0.3–1.2)
Total Protein: 6.6 g/dL (ref 6.5–8.1)

## 2022-07-08 MED ORDER — XTAMPZA ER 18 MG PO C12A: 1.0000 | EXTENDED_RELEASE_CAPSULE | Freq: Three times a day (TID) | ORAL | 0 refills | Status: DC

## 2022-07-08 MED ORDER — OXYCODONE-ACETAMINOPHEN 7.5-325 MG PO TABS: 2.0000 | ORAL_TABLET | ORAL | 0 refills | Status: DC | PRN

## 2022-07-08 MED ORDER — KETOROLAC TROMETHAMINE 30 MG/ML IJ SOLN
30.0000 mg | Freq: Once | INTRAMUSCULAR | Status: AC
  Administered 2022-07-08: 30 mg via INTRAMUSCULAR
  Filled 2022-07-08: qty 1

## 2022-07-08 NOTE — Progress Notes (Signed)
[place]  [phone] [phone]   Name: Albert Perez Date: 07/08/2022 MRN: [id number]  DOB: [birth year]  Patient Care Team: Aura Dials, MD as PCP - General (Family Medicine) Constance Haw, MD as PCP - Electrophysiology (Cardiology) Lorretta Harp, MD as PCP - Cardiology (Cardiology) Gentry Fitz, MD as Consulting Physician (Orthopedic Surgery) Pickenpack-Cousar, [name], NP as Nurse Practitioner (Nurse Practitioner)    INTERVAL HISTORY: Albert Perez is a 72 y.o. 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 Recent scans  Palliative ask to see for.  Palliative ask to see for symptom management.  SOCIAL HISTORY:     reports that he has been smoking cigarettes. He has a 7.50 pack-year smoking history. He has never used smokeless tobacco. He reports current alcohol use of about 2.0 standard drinks of alcohol per week. He reports current drug use. Drug: Marijuana.  ADVANCE DIRECTIVES:    CODE STATUS:   PAST MEDICAL HISTORY: Past Medical History:  Diagnosis Date   A-fib [facility])    PMH: 2000-2001   Arthritis    Cancer ([place])    melanoma 2018   Diarrhea    due to medications   DVT (deep venous thrombosis) (HCC)    Left brachial vein DVT (01/30/19, following fracture)   Dysrhythmia    afib -    ETOH abuse    GERD (gastroesophageal reflux disease)    ocassional   History of radiation therapy    Right Hip 02/08/22-02/19/22-Dr. Gery Pray   History of radiation therapy    Right Pelvis-05/06/22-05/19/22- Dr. Gery Pray   Lung cancer [facility]) dx'd 02/26/2019   Metastatic cancer to bone ([place]) dx'd 02/26/2019   Sleep apnea    do not wear CPAP   Stroke ([place]) 2019   "  mini"   Vertigo    Wears glasses     ALLERGIES:  has No Known Allergies.  MEDICATIONS:  Current Outpatient Medications  Medication Sig Dispense Refill   buPROPion (WELLBUTRIN XL) 150 MG 24 hr tablet Take 150 mg by mouth daily as needed (nicotine cravings).     carvedilol (COREG) 6.25 MG tablet TAKE 1 TABLET BY MOUTH TWICE A DAY 180 tablet 3   Cholecalciferol (VITAMIN D-3) 25 MCG (1000 UT) CAPS Take 1,000 Units by mouth daily.     ELIQUIS 5 MG TABS tablet TAKE 1 TABLET BY MOUTH TWICE A DAY 60 tablet 2   furosemide (LASIX) 20 MG tablet Take 20 mg by mouth daily.     loperamide (IMODIUM) 2 MG capsule Take 2 mg by mouth as needed for diarrhea or loose stools.     Magnesium 500 MG CAPS Take 1,000 mg by mouth daily.     Multiple Vitamin (MULTIVITAMIN PO) Take 1 tablet by mouth daily. Centrum     osimertinib mesylate (TAGRISSO) 80 MG tablet Take 1 tablet (80 mg total) by mouth daily. 60 tablet 3   OVER THE COUNTER MEDICATION Take 1 tablet by mouth daily. Standard Process, LivaPlex (Liver supplement)     oxyCODONE ER (XTAMPZA ER) 18 MG C12A Take 1 capsule by mouth every 8 (eight) hours. 60 capsule 0   oxyCODONE-acetaminophen (PERCOCET) 10-325 MG tablet Take 1 tablet by mouth every 4 (four) hours as needed for pain. 90 tablet 0   potassium chloride (  KLOR-CON) 10 MEQ tablet Take 1 tablet (10 mEq total) by mouth daily. 90 tablet 1   rosuvastatin (CRESTOR) 5 MG tablet Take 5 mg by mouth daily.  2   torsemide (DEMADEX) 20 MG tablet Take 2 tablets (40 mg total) by mouth 2 (two) times daily. 180 tablet 1   TURMERIC PO Take 1 capsule by mouth daily.     vitamin B-12 (CYANOCOBALAMIN) 1000 MCG tablet Take 1,000 mcg by mouth daily.     No current facility-administered medications for this visit.    VITAL SIGNS: There were no vitals taken for this visit. There were no vitals filed for this visit.   Estimated body mass index is 26.2 kg/m as calculated from the following:   Height as of 06/21/22: 5'  7" (1.702 m).   Weight as of an earlier encounter on 07/08/22: 167 lb 4.8 oz (75.9 kg).  Assessment NAD, ambulating with walker Normal breathing pattern RRR Bilateral lower extremity edema AAO x3  PERFORMANCE STATUS (ECOG) : 1 - Symptomatic but completely ambulatory  IMPRESSION:  Albert Perez presented to clinic today for symptom management follow-up. Continues to have bilateral lower extremity edema which he reports causes and increase to his pain. He has an appointment later today with his Cardiologist. Unfortunately his pain has increased over the past week. He reports findings of new enlarged nodules in his groin area. He was seen by Dr. Julien Nordmann today.   Denies nausea, vomiting, or diarrhea. Occasional constipation. Is taking Miralax daily. Understands to increase to twice daily if no bowel movement over 48 hours.   [name] is now ambulating with walker for stability as he reports pain increases when trying to walk with full weight bearing. He also relies on his walker for stability as pain will sometimes come on suddenly and sharp catching him off guard. He has not been able to sleep in his bed due to discomfort and inability to get to a comfortable position.   We discussed current pain regimen at length. Mr. Stuck is taking oxycodone 10 mg every 4 hours as needed for pain. Tolerating Xtampza 18mg . Given increased pain we will increase Xtampza to 27mg  however he just picked up new prescription several days ago. I have advised to begin taking every 8 hours to utilize medication he has on hand. We will increase his oxycodone to 15mg  as needed for breakthrough pain. He and wife verbalized understanding.   We will continue to closely monitor and adjust as needed.   PLAN: Oxycodone 15 mg every 4-6 hours as needed for breakthrough pain Xtampza 18 mg every 8 hours  Miralax daily for bowel regimen  Toradol 30 mg IM today We will continue to closely monitor and adjust as needed. I will plan to see  patient back in 3-4 weeks in collaboration with his other oncology/radiation appointments.  He knows to contact our office sooner if needed.   Patient expressed understanding and was in agreement with this plan. He also understands that He can call the clinic at any time with any questions, concerns, or complaints.        Any controlled substances utilized were prescribed in the context of palliative care. PDMP has been reviewed.    Time Total: 45 min   Visit consisted of counseling and education dealing with the complex and emotionally intense issues of symptom management and palliative care in the setting of serious and potentially life-threatening illness.Greater than 50%  of this time was spent counseling and coordinating care related to  the above assessment and plan.  Alda Lea, AGPCNP-BC  Palliative Medicine Team/[place], AGPCNP-BC  Palliative Medicine Team/[place]

## 2022-07-08 NOTE — Progress Notes (Signed)
Cardiology Office Note:    Date:  07/08/2022   ID:  Albert Perez, DOB [birth year], MRN [id number]  PCP:  Aura Dials, MD  Cardiologist:  Quay Burow, MD  Electrophysiologist:  Constance Haw, MD   Referring MD: Aura Dials, MD   Chief Complaint: evaluation of  lower extremity edema   History of Present Illness:    Albert Perez is a 72 y.o. male with a history of CAD with elevated coronary calcium score in 18/5631, chronic diastolic CHF, paroxysmal atrial fibrillation on Eliquis, paroxysmal SVT and non-sustained VT noted on monitor in 08/2019, GERD, obstructive sleep apnea unable to tolerate CPAP, and stage IV lung cancer with metastasis to multiple bones s/p palliative radiation who is followed by Dr. Gwenlyn Found and who is seen today for evaluation of progressive edema.    Patient was referred to Dr. Gwenlyn Found in 06/2019 for further evaluation of tachypalpitaitons after recent ED visit for SVT vs atrial flutter with 2-1 block (converted spontaneously with Valsalva). He also reported a brief episode of atrial fibrillation back in 2000 which he related to stress, alcohol, and caffein. At that time, he was having intermittent palpitations on a monthly basis. Event monitor was ordered and showed underling sinus rhythm wit runs of paroxysmal atrial fibrillation, SVT, and NSVT. He was started on Coreg and Eliquis. Coronary calcium was also ordered at initial visit with Dr. Gwenlyn Found in 06/2019 and came back at 9 (79th percentile for age and sex). Prior Echo in 11/2017 showed LVEF of 55-60% with no regional wall motion abnormalities and grade 1 diastolic dysfunction.   Over the past 2-3 months he has been struggling with lower extremity edema. He has stage IV lung cancer with metastasis to multiple bones including the pelvis and finished palliative radiation for a mass in his hip.  He was seen by Caron Presume, PA-C, on 06/02/2022 and reported continued lower extremity edema despite starting Lasix  and his weight was up from 163 lbs to 175 lbs. He was noted to have 2+ edema of bilateral lower extremities up to his thighs. Repeat Echo was ordered and switched from lasix to Torsemide instead.  Seen again on 06/10/22 by Sande Rives PA-C. Noted persistent swelling but weight was down 5 lbs and swelling was a little better.  Echo ordered but still hasn't been done. BNP was 94.   Since then he has been on antibiotics and has had his legs wrapped. He has persistent swelling and this is uncomfortable. Notes sores on legs are better. Was seen by oncology this am and labs done. Renal function stable. Plan LE venous dopplers and well as PET scan. He is now on Torsemide 80 mg in the am and 40 mg in the pm.  Past Medical History:  Diagnosis Date   A-fib [facility])    PMH: 2000-2001   Arthritis    Cancer ([place])    melanoma 2018   Diarrhea    due to medications   DVT (deep venous thrombosis) (HCC)    Left brachial vein DVT (01/30/19, following fracture)   Dysrhythmia    afib -    ETOH abuse    GERD (gastroesophageal reflux disease)    ocassional   History of radiation therapy    Right Hip 02/08/22-02/19/22-Dr. Gery Pray   History of radiation therapy    Right Pelvis-05/06/22-05/19/22- Dr. Gery Pray   Lung cancer [facility]) dx'd 02/26/2019   Metastatic cancer to bone ([place]) dx'd 02/26/2019   Sleep apnea  do not wear CPAP   Stroke ([place]) 2019   " mini"   Vertigo    Wears glasses     Past Surgical History:  Procedure Laterality Date   ANKLE FRACTURE SURGERY Right 2003   COLONOSCOPY  2018   ELBOW SURGERY Left 1964   INGUINAL HERNIA REPAIR Bilateral 11/12/2020   Procedure: BILATERAL OPEN INGUINAL HERNIA REPAIR WITH MESH;  Surgeon: Coralie Keens, MD;  Location: [place];  Service: General;  Laterality: Bilateral;   melanoma removal  04/2017   Lt shoulder   RETINAL DETACHMENT SURGERY Right 1971   TONSILLECTOMY     VIDEO BRONCHOSCOPY WITH ENDOBRONCHIAL NAVIGATION N/A 03/28/2019   Procedure:  VIDEO BRONCHOSCOPY WITH ENDOBRONCHIAL NAVIGATION and Biopsies;  Surgeon: Collene Gobble, MD;  Location: [facility] OR;  Service: Thoracic;  Laterality: N/A;    Current Medications: Current Meds  Medication Sig   buPROPion (WELLBUTRIN XL) 150 MG 24 hr tablet Take 150 mg by mouth daily as needed (nicotine cravings).   carvedilol (COREG) 6.25 MG tablet TAKE 1 TABLET BY MOUTH TWICE A DAY   Cholecalciferol (VITAMIN D-3) 25 MCG (1000 UT) CAPS Take 1,000 Units by mouth daily.   ELIQUIS 5 MG TABS tablet TAKE 1 TABLET BY MOUTH TWICE A DAY   furosemide (LASIX) 20 MG tablet Take 20 mg by mouth daily.   loperamide (IMODIUM) 2 MG capsule Take 2 mg by mouth as needed for diarrhea or loose stools.   Magnesium 500 MG CAPS Take 1,000 mg by mouth daily.   Multiple Vitamin (MULTIVITAMIN PO) Take 1 tablet by mouth daily. Centrum   osimertinib mesylate (TAGRISSO) 80 MG tablet Take 1 tablet (80 mg total) by mouth daily.   OVER THE COUNTER MEDICATION Take 1 tablet by mouth daily. Standard Process, LivaPlex (Liver supplement)   oxyCODONE ER (XTAMPZA ER) 18 MG C12A Take 1 capsule by mouth every 8 (eight) hours.   oxyCODONE-acetaminophen (PERCOCET) 7.5-325 MG tablet Take 2 tablets by mouth every 4 (four) hours as needed for severe pain.   potassium chloride (KLOR-CON) 10 MEQ tablet Take 1 tablet (10 mEq total) by mouth daily.   rosuvastatin (CRESTOR) 5 MG tablet Take 5 mg by mouth daily.   torsemide (DEMADEX) 20 MG tablet Take 4 tablets ( 80 mg ) in morning and take 2 tablets ( 40 mg ) in afternoon   TURMERIC PO Take 1 capsule by mouth daily.   vitamin B-12 (CYANOCOBALAMIN) 1000 MCG tablet Take 1,000 mcg by mouth daily.   [DISCONTINUED] torsemide (DEMADEX) 20 MG tablet Take 2 tablets (40 mg total) by mouth 2 (two) times daily.     Allergies:   Patient has no known allergies.   Social History   Socioeconomic History   Marital status: Married    Spouse name: Not on file   Number of children: 2   Years of education:  14   Highest education level: Some college, no degree  Occupational History   Not on file  Tobacco Use   Smoking status: Every Day    Packs/day: 0.25    Years: 30.00    Total pack years: 7.50    Types: Cigarettes   Smokeless tobacco: Never   Tobacco comments:    04/27/21: Per pt, smokes 3 cigs a day now.  Vaping Use   Vaping Use: Former  Substance and Sexual Activity   Alcohol use: Yes    Alcohol/week: 2.0 standard drinks of alcohol    Types: 2 Standard drinks or equivalent per week  Drug use: Yes    Types: Marijuana    Comment: ocassionally- last time was 11/07/20   Sexual activity: Not on file  Other Topics Concern   Not on file  Social History Narrative   Lives at home with wife, daughter and grandson   Right handed   Caffeine: sodas 2 per day   Social Determinants of Health   Financial Resource Strain: Not on file  Food Insecurity: Not on file  Transportation Needs: Not on file  Physical Activity: Not on file  Stress: Not on file  Social Connections: Not on file     Family History: The patient's family history includes Aneurysm in his mother; Colon cancer in his brother; Prostate cancer in his brother.  ROS:   Please see the history of present illness.     EKGs/Labs/Other Studies Reviewed:    The following studies were reviewed:  Echocardiogram 11/16/2017: Study Conclusions: - Left ventricle: The cavity size was normal. Wall thickness was    normal. Systolic function was normal. The estimated ejection    fraction was in the range of 55% to 60%. Wall motion was normal;    there were no regional wall motion abnormalities. Doppler    parameters are consistent with abnormal left ventricular    relaxation (grade 1 diastolic dysfunction).  - Right atrium: The atrium was mildly dilated.   _______________  CT Cardiac Scoring 06/25/2019: Impressions: Coronary calcium score of 640. This was 46 percentile for age and sex matched  control. _______________  Monitor 06/24/2019 to 07/25/2019: 1: Sinus rhythm/sinus tachycardia/sinus bradycardia (rates in the low 40s) 2: Runs of SVT and nonsustained ventricular tachycardia. 3: Runs of PAF 4: Needs return office visit this week to discuss   EKG:  EKG not ordered today. .  Recent Labs: 06/10/2022: BNP 93.6 07/08/2022: ALT 15; BUN 20; Creatinine, Ser 1.25; Hemoglobin 10.4; Platelets 242; Potassium 3.9; Sodium 138  Recent Lipid Panel No results found for: "CHOL", "TRIG", "HDL", "CHOLHDL", "VLDL", "LDLCALC", "LDLDIRECT"  Physical Exam:    Vital Signs: BP 136/68   Pulse 60   Ht 5' 7.5" (1.715 m)   Wt 170 lb 6.4 oz (77.3 kg)   SpO2 97%   BMI 26.29 kg/m     Wt Readings from Last 3 Encounters:  07/08/22 170 lb 6.4 oz (77.3 kg)  07/08/22 167 lb 4.8 oz (75.9 kg)  06/23/22 166 lb 1.6 oz (75.3 kg)     General: 72 y.o. Caucasian male in no acute distress. HEENT: Normocephalic and atraumatic. Sclera clear.  Neck: Supple. No JVD. Heart: RRR. Soft II/IV systolic murmur. No gallops or rubs.  Lungs: No increased work of breathing. Clear to ausculation bilaterally. No wheezes, rhonchi, or rales.  Abdomen: Soft, non-distended, and non-tender to palpation.  Extremities: 2-3+ pitting edema extending up to thighs bilaterally with leg wraps below the knee.  Skin: Warm and dry. Neuro: Alert and oriented x3. No focal deficits. Psych: Normal affect. Responds appropriately.  Assessment:    1. Lower extremity edema   2. Possible chronic diastolic CHF   3. Paroxysmal atrial fibrillation (HCC)   4. Elevated coronary artery calcium score   5. Lung cancer metastatic to bone [facility])      Plan:    Lower Extremity Edema  Possible Chronic Diastolic CHF vs. Chronic Venous Insufficiency vs lymphedema from metastatic CA.  Patient has worsening lower extremity edema since completing radiation of his hip about 2 months ago.  He was switched from Lasix to Torsemide. Not a great  response.    Last Echo in 2019 showed LVEF of 55-60% with grade 1 diastolic dysfunction.  - Continue Torsemide 80 mg in am and 40 mg in pm - repeat Echo pending- patient reports he had difficulty lying on table due to pain. He will take Oxycodone prior to exam. If still unable to lie on table then would attempt to image sitting up and get best images possible.  - check LE venous dopplers.  - am labs today stable.  - PET scan per oncology - Continue limiting sodium intake and continue leg wraps.  - I suspect this may all be related to metastatic disease to lymph nodes in which case no amount of diuretics will help . Will await results of above studies.   Paroxysmal Atrial Fibrillation Paroxysmal SVT and NSVT Monitor in 06/2019 showed underlying sinus rhythm with runs of paroxysmal atrial fibrillation, SVT, and NSVT.  - Maintaining sinus rhythm on exam.  - No significant palpitations.  - Continue 6.25mg  twice daily. - Continue Eliquis 5mg  twice daily.  Disposition: Keep follow-up with Caron Presume, PA-C, next month.   Medication Adjustments/Labs and Tests Ordered: Current medicines are reviewed at length with the patient today.  Concerns regarding medicines are outlined above.  No orders of the defined types were placed in this encounter.  No orders of the defined types were placed in this encounter.   Patient Instructions  Medication Instructions:  Continue Demadex 80 mg in am and 40 mg in pm Continue all other medications *If you need a refill on your cardiac medications before your next appointment, please call your pharmacy*   Lab Work: None ordered   Testing/Procedures: Lower Ext Venous Doppler today at [employer] office  Keep echo appointment as scheduled   Follow-Up: At [facility], you and your health needs are our priority.  As part of our continuing mission to provide you with exceptional heart care, we have created designated Provider Care Teams.  These Care  Teams include your primary Cardiologist (physician) and Advanced Practice Providers (APPs -  Physician Assistants and Nurse Practitioners) who all work together to provide you with the care you need, when you need it.  We recommend signing up for the patient portal called "MyChart".  Sign up information is provided on this After Visit Summary.  MyChart is used to connect with patients for Virtual Visits (Telemedicine).  Patients are able to view lab/test results, encounter notes, upcoming appointments, etc.  Non-urgent messages can be sent to your provider as well.   To learn more about what you can do with MyChart, go to [email or web address].    Your next appointment:  1 month    The format for your next appointment: Office     Provider:  PA   Important Information About Sugar         Signed, Jeremy Mclamb [place], MD  07/08/2022 11:43 AM    [place]

## 2022-07-08 NOTE — Progress Notes (Signed)
[place] Telephone:(336) [phone]   Fax:([phone]) [phone]  OFFICE PROGRESS NOTE  Aura Dials, MD [place] [address]  DIAGNOSIS: Stage IV (T3, N0, M1c) non-small cell lung cancer, adenocarcinoma presented with cavitary left lower lobe lung mass in addition to other pulmonary nodules and scattered hypermetabolic osseous metastatic disease involving the spine, ribs and bony pelvis as well as left proximal humeral pathologic fracture.    Molecular Biomarkers performed by [facility]) / [name]      [id number]  Afatinib, Dacomitinib, Erlotinib, Gefitinib, Osimertinib, Ramucirumab Neratinib   FGFR1Amplification  Erdafitinib, Lenvatinib, Nintedanib, Pazopanib, Pemigatinib, Ponatinib   [id number] None   PRIOR THERAPY: None   CURRENT THERAPY:  1) Palliative radiotherapy to the the left humerus under the care of Dr. Sondra Come. Last treatment on 04/24/2019 2) Targeted therapy with Tagrisso 80 p.o. daily.  He started the first dose on April 21, 2019.  Status post 38 months of treatment. 3) palliative radiotherapy to the right acetabulum under the care of Dr. Sondra Come.  INTERVAL HISTORY: Albert Perez 72 y.o. male returns to the clinic today for follow-up visit accompanied by his wife.  The patient has been complaining of increasing fatigue and weakness as well as pain especially in the lower back and hip area.  He also has significant swelling of the lower extremities right more than left and some of it start having weeping skin.  He is currently on torsemide 40 mg p.o. twice daily.  He denied having any current chest pain but has shortness of breath with exertion with no cough or hemoptysis.  He has no nausea, vomiting, diarrhea or constipation.  He has no headache or visual changes.  He noticed a lump in the right inguinal area.  He is here today for evaluation and recommendation regarding his condition.  MEDICAL  HISTORY: Past Medical History:  Diagnosis Date   A-fib [name])    PMH: 2000-2001   Arthritis    Cancer ([place])    melanoma 2018   Diarrhea    due to medications   DVT (deep venous thrombosis) ([facility])    Left brachial vein DVT (01/30/19, following fracture)   Dysrhythmia    afib -    ETOH abuse    GERD (gastroesophageal reflux disease)    ocassional   History of radiation therapy    Right Hip 02/08/22-02/19/22-Dr. Gery Pray   History of radiation therapy    Right Pelvis-05/06/22-05/19/22- Dr. Gery Pray   Lung cancer [name]) dx'd 02/26/2019   Metastatic cancer to bone ([place]) dx'd 02/26/2019   Sleep apnea    do not wear CPAP   Stroke ([place]) 2019   " mini"   Vertigo    Wears glasses     ALLERGIES:  has No Known Allergies.  MEDICATIONS:  Current Outpatient Medications  Medication Sig Dispense Refill   buPROPion (WELLBUTRIN XL) 150 MG 24 hr tablet Take 150 mg by mouth daily as needed (nicotine cravings).     carvedilol (COREG) 6.25 MG tablet TAKE 1 TABLET BY MOUTH TWICE A DAY 180 tablet 3   Cholecalciferol (VITAMIN D-3) 25 MCG (1000 UT) CAPS Take 1,000 Units by mouth daily.     ELIQUIS 5 MG TABS tablet TAKE 1 TABLET BY MOUTH TWICE A DAY 60 tablet 2   loperamide (IMODIUM) 2 MG capsule Take 2 mg by mouth as needed for diarrhea or loose stools.     Magnesium 500 MG CAPS Take  1,000 mg by mouth daily.     Multiple Vitamin (MULTIVITAMIN PO) Take 1 tablet by mouth daily. Centrum     osimertinib mesylate (TAGRISSO) 80 MG tablet Take 1 tablet (80 mg total) by mouth daily. 60 tablet 3   OVER THE COUNTER MEDICATION Take 1 tablet by mouth daily. Standard Process, LivaPlex (Liver supplement)     oxyCODONE ER (XTAMPZA ER) 18 MG C12A Take 1 capsule by mouth every 12 (twelve) hours. 60 capsule 0   oxyCODONE-acetaminophen (PERCOCET) 10-325 MG tablet Take 1 tablet by mouth every 4 (four) hours as needed for pain. 90 tablet 0   potassium chloride (KLOR-CON) 10 MEQ tablet Take 1 tablet (10 mEq total)  by mouth daily. 90 tablet 1   rosuvastatin (CRESTOR) 5 MG tablet Take 5 mg by mouth daily.  2   torsemide (DEMADEX) 20 MG tablet Take 2 tablets (40 mg total) by mouth 2 (two) times daily. 180 tablet 1   TURMERIC PO Take 1 capsule by mouth daily.     vitamin B-12 (CYANOCOBALAMIN) 1000 MCG tablet Take 1,000 mcg by mouth daily.     No current facility-administered medications for this visit.    SURGICAL HISTORY:  Past Surgical History:  Procedure Laterality Date   ANKLE FRACTURE SURGERY Right 2003   COLONOSCOPY  2018   ELBOW SURGERY Left 1964   INGUINAL HERNIA REPAIR Bilateral 11/12/2020   Procedure: BILATERAL OPEN INGUINAL HERNIA REPAIR WITH MESH;  Surgeon: Coralie Keens, MD;  Location: [facility];  Service: General;  Laterality: Bilateral;   melanoma removal  04/2017   Lt shoulder   RETINAL DETACHMENT SURGERY Right 1971   TONSILLECTOMY     VIDEO BRONCHOSCOPY WITH ENDOBRONCHIAL NAVIGATION N/A 03/28/2019   Procedure: VIDEO BRONCHOSCOPY WITH ENDOBRONCHIAL NAVIGATION and Biopsies;  Surgeon: Collene Gobble, MD;  Location: [facility] OR;  Service: Thoracic;  Laterality: N/A;    REVIEW OF SYSTEMS:  Constitutional: positive for fatigue and weight loss Eyes: negative Ears, nose, mouth, throat, and face: negative Respiratory: positive for dyspnea on exertion Cardiovascular: negative Gastrointestinal: negative Genitourinary:negative Integument/breast: negative Hematologic/lymphatic: negative Musculoskeletal:positive for bone pain and muscle weakness Neurological: negative Behavioral/Psych: negative Endocrine: negative Allergic/Immunologic: negative   PHYSICAL EXAMINATION: General appearance: alert, cooperative, fatigued, and no distress Head: Normocephalic, without obvious abnormality, atraumatic Neck: no adenopathy, no JVD, supple, symmetrical, trachea midline, and thyroid not enlarged, symmetric, no tenderness/mass/nodules Lymph nodes: Cervical, supraclavicular, and axillary nodes  normal. Resp: clear to auscultation bilaterally Back: symmetric, no curvature. ROM normal. No CVA tenderness. Cardio: regular rate and rhythm, S1, S2 normal, no murmur, click, rub or gallop GI: soft, non-tender; bowel sounds normal; no masses,  no organomegaly Extremities: edema 2+ edema bilateral Neurologic: Alert and oriented X 3, normal strength and tone. Normal symmetric reflexes. Normal coordination and gait   ECOG PERFORMANCE STATUS: 1 - Symptomatic but completely ambulatory  Blood pressure (!) 131/45, pulse 64, temperature 97.7 F (36.5 C), temperature source Temporal, resp. rate 15, weight 167 lb 4.8 oz (75.9 kg), SpO2 98 %.  LABORATORY DATA: Lab Results  Component Value Date   WBC 6.9 06/23/2022   HGB 10.7 (L) 06/23/2022   HCT 31.5 (L) 06/23/2022   MCV 91.8 06/23/2022   PLT 245 06/23/2022      Chemistry      Component Value Date/Time   NA 136 06/23/2022 1439   NA 138 06/10/2022 1006   K 4.4 06/23/2022 1439   CL 99 06/23/2022 1439   CO2 30 06/23/2022 1439   BUN 23  06/23/2022 1439   BUN 17 06/10/2022 1006   CREATININE 1.32 (H) 06/23/2022 1439      Component Value Date/Time   CALCIUM 8.9 06/23/2022 1439   ALKPHOS 96 06/23/2022 1439   AST 20 06/23/2022 1439   ALT 23 06/23/2022 1439   BILITOT 0.5 06/23/2022 1439       RADIOGRAPHIC STUDIES: CT Chest W Contrast  Result Date: 06/17/2022 CLINICAL DATA:  Metastatic lung cancer restaging * Tracking Code: BO * EXAM: CT CHEST, ABDOMEN, AND PELVIS WITH CONTRAST TECHNIQUE: Multidetector CT imaging of the chest, abdomen and pelvis was performed following the standard protocol during bolus administration of intravenous contrast. RADIATION DOSE REDUCTION: This exam was performed according to the departmental dose-optimization program which includes automated exposure control, adjustment of the mA and/or kV according to patient size and/or use of iterative reconstruction technique. CONTRAST:  111m OMNIPAQUE IOHEXOL 300 MG/ML   SOLN COMPARISON:  CT pelvis, 04/27/2022, CT chest abdomen pelvis, 02/26/2022 FINDINGS: CT CHEST FINDINGS Cardiovascular: Aortic atherosclerosis. Normal heart size. Three-vessel coronary calcifications. No pericardial effusion. Mediastinum/Nodes: No enlarged mediastinal, hilar, or axillary lymph nodes. Thyroid gland, trachea, and esophagus demonstrate no significant findings. Lungs/Pleura: Unchanged spiculated mass of the superior segment left lower lobe measuring 3.5 x 2.3 cm (series 4, image 63). Unchanged pulmonary nodules, including a 0.3 cm nodule of the azygoesophageal recess of the right lower lobe (series 4, image 111) and a 1.5 x 0.6 cm peribronchovascular nodule of the medial segment right middle lobe (series 4, image 67). Minimal paraseptal emphysema with a background of fine centrilobular pulmonary nodularity, most concentrated in the lung apices. No pleural effusion or pneumothorax. Musculoskeletal: No chest wall abnormality. No acute osseous findings. CT ABDOMEN PELVIS FINDINGS Hepatobiliary: No solid liver abnormality is seen. No gallstones, gallbladder wall thickening, or biliary dilatation. Pancreas: Unremarkable. No pancreatic ductal dilatation or surrounding inflammatory changes. Spleen: Normal in size without significant abnormality. Adrenals/Urinary Tract: Adrenal glands are unremarkable. Kidneys are normal, without renal calculi, solid lesion, or hydronephrosis. Diffuse thickening of the urinary bladder wall, likely secondary to chronic outlet obstruction (series 6, image 70). Stomach/Bowel: Stomach is within normal limits. Appendix appears normal. No evidence of bowel wall thickening, distention, or inflammatory changes. Vascular/Lymphatic: Aortic atherosclerosis. Interval enlargement of retroperitoneal lymph nodes, largest left retroperitoneal node measuring 1.8 x 1.5 cm, previously 1.0 x 0.9 cm (series 2, image 68). Reproductive: No mass or other abnormality. Other: No abdominal wall hernia  or abnormality. No ascites. Musculoskeletal: No acute osseous findings. When compared to most recent imaging of the pelvis dated 04/27/2022, there has been slight interval decrease in size of a lytic lesion with a large extraosseous soft tissue component of the right iliac crest measuring 3.2 x 2.8 cm, previously 4.9 x 3.8 cm (series 2, image 93). Similar appearance of additional lytic lesions of the medial right acetabulum as well as the right inferior pubic ramus, with subtle evidence of interval post treatment sclerosis (series 2, image 105, 115). Additional mixed lytic and sclerotic osseous metastatic lesions involving the vertebral bodies and other portions of the pelvis are not significantly changed in comparison to both prior examination dated 02/26/2022 and 04/27/2022. IMPRESSION: 1. Unchanged spiculated mass of the superior segment left lower lobe. 2. Unchanged small pulmonary nodules. 3. When compared to most recent imaging of the pelvis dated 04/27/2022, there has been slight interval decrease in size of a lytic lesion with a large extraosseous soft tissue component of the right iliac crest. Similar appearance of additional lytic lesions of the  medial right acetabulum as well as the right inferior pubic ramus, with subtle evidence of interval post treatment sclerosis. Findings are consistent with treatment response to local radiation therapy. 4. Additional treated mixed lytic and sclerotic osseous metastatic lesions involving the vertebral bodies and other portions of the pelvis are not significantly changed in comparison to both prior examination dated 02/26/2022 and 04/27/2022. 5. Interval enlargement of retroperitoneal lymph nodes, concerning for early nodal metastatic disease. PET-CT may be helpful to assess for metabolic activity. 6. Minimal emphysema with a background of fine centrilobular pulmonary nodularity, most concentrated in the lung apices, consistent with smoking-related respiratory  bronchiolitis. 7. Coronary artery disease. Aortic Atherosclerosis (ICD10-I70.0) and Emphysema (ICD10-J43.9). Electronically Signed   By: Delanna Ahmadi M.D.   On: 06/17/2022 14:35   CT Abdomen Pelvis W Contrast  Result Date: 06/17/2022 CLINICAL DATA:  Metastatic lung cancer restaging * Tracking Code: BO * EXAM: CT CHEST, ABDOMEN, AND PELVIS WITH CONTRAST TECHNIQUE: Multidetector CT imaging of the chest, abdomen and pelvis was performed following the standard protocol during bolus administration of intravenous contrast. RADIATION DOSE REDUCTION: This exam was performed according to the departmental dose-optimization program which includes automated exposure control, adjustment of the mA and/or kV according to patient size and/or use of iterative reconstruction technique. CONTRAST:  179m OMNIPAQUE IOHEXOL 300 MG/ML  SOLN COMPARISON:  CT pelvis, 04/27/2022, CT chest abdomen pelvis, 02/26/2022 FINDINGS: CT CHEST FINDINGS Cardiovascular: Aortic atherosclerosis. Normal heart size. Three-vessel coronary calcifications. No pericardial effusion. Mediastinum/Nodes: No enlarged mediastinal, hilar, or axillary lymph nodes. Thyroid gland, trachea, and esophagus demonstrate no significant findings. Lungs/Pleura: Unchanged spiculated mass of the superior segment left lower lobe measuring 3.5 x 2.3 cm (series 4, image 63). Unchanged pulmonary nodules, including a 0.3 cm nodule of the azygoesophageal recess of the right lower lobe (series 4, image 111) and a 1.5 x 0.6 cm peribronchovascular nodule of the medial segment right middle lobe (series 4, image 67). Minimal paraseptal emphysema with a background of fine centrilobular pulmonary nodularity, most concentrated in the lung apices. No pleural effusion or pneumothorax. Musculoskeletal: No chest wall abnormality. No acute osseous findings. CT ABDOMEN PELVIS FINDINGS Hepatobiliary: No solid liver abnormality is seen. No gallstones, gallbladder wall thickening, or biliary  dilatation. Pancreas: Unremarkable. No pancreatic ductal dilatation or surrounding inflammatory changes. Spleen: Normal in size without significant abnormality. Adrenals/Urinary Tract: Adrenal glands are unremarkable. Kidneys are normal, without renal calculi, solid lesion, or hydronephrosis. Diffuse thickening of the urinary bladder wall, likely secondary to chronic outlet obstruction (series 6, image 70). Stomach/Bowel: Stomach is within normal limits. Appendix appears normal. No evidence of bowel wall thickening, distention, or inflammatory changes. Vascular/Lymphatic: Aortic atherosclerosis. Interval enlargement of retroperitoneal lymph nodes, largest left retroperitoneal node measuring 1.8 x 1.5 cm, previously 1.0 x 0.9 cm (series 2, image 68). Reproductive: No mass or other abnormality. Other: No abdominal wall hernia or abnormality. No ascites. Musculoskeletal: No acute osseous findings. When compared to most recent imaging of the pelvis dated 04/27/2022, there has been slight interval decrease in size of a lytic lesion with a large extraosseous soft tissue component of the right iliac crest measuring 3.2 x 2.8 cm, previously 4.9 x 3.8 cm (series 2, image 93). Similar appearance of additional lytic lesions of the medial right acetabulum as well as the right inferior pubic ramus, with subtle evidence of interval post treatment sclerosis (series 2, image 105, 115). Additional mixed lytic and sclerotic osseous metastatic lesions involving the vertebral bodies and other portions of the pelvis are not  significantly changed in comparison to both prior examination dated 02/26/2022 and 04/27/2022. IMPRESSION: 1. Unchanged spiculated mass of the superior segment left lower lobe. 2. Unchanged small pulmonary nodules. 3. When compared to most recent imaging of the pelvis dated 04/27/2022, there has been slight interval decrease in size of a lytic lesion with a large extraosseous soft tissue component of the right iliac  crest. Similar appearance of additional lytic lesions of the medial right acetabulum as well as the right inferior pubic ramus, with subtle evidence of interval post treatment sclerosis. Findings are consistent with treatment response to local radiation therapy. 4. Additional treated mixed lytic and sclerotic osseous metastatic lesions involving the vertebral bodies and other portions of the pelvis are not significantly changed in comparison to both prior examination dated 02/26/2022 and 04/27/2022. 5. Interval enlargement of retroperitoneal lymph nodes, concerning for early nodal metastatic disease. PET-CT may be helpful to assess for metabolic activity. 6. Minimal emphysema with a background of fine centrilobular pulmonary nodularity, most concentrated in the lung apices, consistent with smoking-related respiratory bronchiolitis. 7. Coronary artery disease. Aortic Atherosclerosis (ICD10-I70.0) and Emphysema (ICD10-J43.9). Electronically Signed   By: Delanna Ahmadi M.D.   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  He is status post palliative radiotherapy to the metastatic disease in the left arm. The patient is currently undergoing treatment with target therapy with Tagrisso 80 mg p.o. daily started 38  months ago. He has been tolerating his treatment with Tagrisso fairly well but has some evidence for progression especially in the bone treated with radiation. The patient underwent palliative radiotherapy to bone metastasis in the right acetabular area under the care of Dr. Sondra Come.   I  recommended for the patient to continue his current treatment with Tagrisso for now but I will order repeat blood test by [facility] for molecular studies to rule out the development of any resistant mutation. I will also complete the staging workup by ordering a PET scan to rule out any other metastatic disease especially with the progressive bone metastasis and the significant swelling of the lower extremities suspicious for progressive abdominal/inguinal lymphadenopathy. For the swelling of the lower extremities, I will order Doppler of the lower extremity to rule out deep venous thrombosis.  The patient will continue his current treatment with Lasix but that we will increase the dose of torsemide to 80 mg p.o. every morning and 40 mg p.o. every afternoon.  He was also advised to continue with his potassium supplements.  He is also scheduled to see his cardiologist Dr. Martinique later today for recommendation regarding the swelling. For the bone metastasis, he will continue his current treatment with Xgeva on monthly basis. For the pain management he is followed by the palliative care team and currently on treatment with MS Contin as well as oxycodone for breakthrough pain management. I will see the patient back for follow-up visit in around 2 weeks for evaluation and discussion of his future treatment options based on the result of the PET scan as well as the molecular study. The patient was advised to call immediately if he has any other  concerning symptoms in the interval. The patient voices understanding of current disease status and treatment options and is in agreement with the current care plan. All questions were answered. The patient knows to call the clinic with any problems, questions or concerns. We can certainly see the patient much sooner if necessary.  Disclaimer: This note was dictated with voice recognition software. Similar sounding words can inadvertently be transcribed and may not be  corrected upon review.

## 2022-07-08 NOTE — Patient Instructions (Addendum)
Today we made the following changes:  Please start taking your Xtampza 18mg  tablet every 8 hours.  Increase stool softener to twice daily  We have changed your Percocet prescription. You will now take 1-2 tablets every 4 hours as needed for breakthrough pain.  Today you received a toradol injection for pain  Please contact office if needed or pain worsens. [phone]

## 2022-07-08 NOTE — Patient Instructions (Signed)
Medication Instructions:  Continue Demadex 80 mg in am and 40 mg in pm Continue all other medications *If you need a refill on your cardiac medications before your next appointment, please call your pharmacy*   Lab Work: None ordered   Testing/Procedures: Lower Ext Venous Doppler today at [employer] office  Keep echo appointment as scheduled   Follow-Up: At [facility], you and your health needs are our priority.  As part of our continuing mission to provide you with exceptional heart care, we have created designated Provider Care Teams.  These Care Teams include your primary Cardiologist (physician) and Advanced Practice Providers (APPs -  Physician Assistants and Nurse Practitioners) who all work together to provide you with the care you need, when you need it.  We recommend signing up for the patient portal called "MyChart".  Sign up information is provided on this After Visit Summary.  MyChart is used to connect with patients for Virtual Visits (Telemedicine).  Patients are able to view lab/test results, encounter notes, upcoming appointments, etc.  Non-urgent messages can be sent to your provider as well.   To learn more about what you can do with MyChart, go to [email or web address].    Your next appointment:  1 month    The format for your next appointment: Office     Provider:  PA   Important Information About Sugar

## 2022-07-09 ENCOUNTER — Other Ambulatory Visit (HOSPITAL_COMMUNITY): Payer: Self-pay

## 2022-07-09 ENCOUNTER — Telehealth: Payer: Self-pay | Admitting: Internal Medicine

## 2022-07-09 ENCOUNTER — Other Ambulatory Visit: Payer: Self-pay

## 2022-07-09 DIAGNOSIS — G893 Neoplasm related pain (acute) (chronic): Secondary | ICD-10-CM

## 2022-07-09 DIAGNOSIS — Z515 Encounter for palliative care: Secondary | ICD-10-CM

## 2022-07-09 DIAGNOSIS — C3490 Malignant neoplasm of unspecified part of unspecified bronchus or lung: Secondary | ICD-10-CM

## 2022-07-09 MED ORDER — OXYCODONE-ACETAMINOPHEN 7.5-325 MG PO TABS
2.0000 | ORAL_TABLET | ORAL | 0 refills | Status: DC | PRN
  Filled 2022-07-09: qty 120, 10d supply, fill #0

## 2022-07-09 NOTE — Telephone Encounter (Signed)
Called patient regarding upcoming January appointments, patient is notified. 

## 2022-07-12 ENCOUNTER — Telehealth: Payer: Self-pay | Admitting: Internal Medicine

## 2022-07-12 ENCOUNTER — Telehealth: Payer: Self-pay | Admitting: Medical Oncology

## 2022-07-12 ENCOUNTER — Telehealth: Payer: Self-pay | Admitting: Pharmacy Technician

## 2022-07-12 ENCOUNTER — Other Ambulatory Visit (HOSPITAL_COMMUNITY): Payer: Self-pay

## 2022-07-12 ENCOUNTER — Encounter: Payer: Self-pay | Admitting: Internal Medicine

## 2022-07-12 NOTE — Telephone Encounter (Signed)
"  R groin lump is  bigger' -He then stated the pain is in the RLQ abd. The pain in this area woke him up at 2 am.  Having more pain. He finds little relief with current meds.   However he said the pain is relieved when he sits  upright in a recliner and sitting on a crescent shaped pillow.   I asked central scheduling to move up his scan .

## 2022-07-12 NOTE — Telephone Encounter (Signed)
Called patient regarding rescheduled 01/17 appointments, patient is notified of new appointment.

## 2022-07-12 NOTE — Telephone Encounter (Signed)
Spoke with pt regarding follow up. Pt was seen by Dr. Martinique (DOD) on Thursday, 1/4. Pt states that swelling has improved some. He continues to take his torsemide as prescribed, limit his salt and wrap his legs. Pt states that he is appreciative of Dr. Doug Sou care. Pt verbalizes understanding.

## 2022-07-12 NOTE — Telephone Encounter (Signed)
Oral Oncology Patient Advocate Encounter   Was successful in securing patient a $4,000 grant from [employer] to provide copayment coverage for [facility].  This will keep the out of pocket expense at $0.     I have spoken with the patient.    The billing information is as follows and has been shared with [place].   Member ID: [id number] Group ID: [id number] RxBin: [id number] PCN: [id number] Dates of Eligibility: 07/12/22 through 07/13/23  Fund name:  [employer].   Lady Deutscher, CPhT-Adv Oncology Pharmacy Patient [place] Direct Number: [phone]  Fax: [phone]

## 2022-07-13 ENCOUNTER — Telehealth: Payer: Self-pay

## 2022-07-13 ENCOUNTER — Telehealth (HOSPITAL_BASED_OUTPATIENT_CLINIC_OR_DEPARTMENT_OTHER): Payer: Medicare Other | Admitting: Nurse Practitioner

## 2022-07-13 ENCOUNTER — Encounter: Payer: Self-pay | Admitting: Nurse Practitioner

## 2022-07-13 DIAGNOSIS — C7951 Secondary malignant neoplasm of bone: Secondary | ICD-10-CM

## 2022-07-13 DIAGNOSIS — G893 Neoplasm related pain (acute) (chronic): Secondary | ICD-10-CM | POA: Diagnosis not present

## 2022-07-13 DIAGNOSIS — C3490 Malignant neoplasm of unspecified part of unspecified bronchus or lung: Secondary | ICD-10-CM

## 2022-07-13 DIAGNOSIS — M792 Neuralgia and neuritis, unspecified: Secondary | ICD-10-CM | POA: Diagnosis not present

## 2022-07-13 MED ORDER — GABAPENTIN 300 MG PO CAPS: 300.0000 mg | ORAL_CAPSULE | Freq: Two times a day (BID) | ORAL | 0 refills | Status: DC

## 2022-07-13 NOTE — Telephone Encounter (Signed)
I connected with Albert Perez on 07/13/2022 at  2:30 pm by Phone and verified that I am speaking with the correct person using two identifiers.   I discussed the limitations, risks, security and privacy concerns of performing an evaluation and management service by telemedicine and the availability of in-person appointments. I also discussed with the patient that there may be a patient responsible charge related to this service. The patient expressed understanding and agreed to proceed.   Other persons participating in the visit and their role in the encounter: Patient and wife    Patient's location: Home   Provider's location: [facility]   Chief Complaint: Pain   Spoke with Albert Perez and his wife via phone to follow-up on his ongoing pain concerns.  Patient reports he has been tolerating recent changes in his medications that we made on last week.  Pain was initially better controlled however over the past 24-48 hours has seemed to increase.  He reports if he is standing or moving around pain is less intense however when sitting in certain lying positions pain is more severe.  Albert Perez describes his pain as a stabbing and burning pain that extends from his growing up into his hip and down his leg.  Unfortunately due to the pain and area in his groin he is finding it more difficult to walk and or get up and go quickly.  He is not requiring assistance from standing to sitting position.  Shares he has stopped taking his diuretics as he cannot make it to the bathroom in time.  Encourage patient to discuss this with his cardiologist to prevent further complications.  We recently increased patient's oxycodone to 15 mg every 4 hours and Xtampza 18 mg 3 times daily.  He denies any unwanted side effects or constipation.  Given patient is describing pain as a more burning and sharp feeling education has been provided on the use of gabapentin in addition to other pain medications.  Education provided on side  effects and when to contact the office or seek emergency assistance.  Patient and wife verbalized understanding.  We will continue to closely monitor.  I will plan to have phone follow-up with patient on Friday to evaluate his level of effectiveness with recent changes.  We also discussed nonpharmacological methods to assist in decreasing his pain.  Prescription for gabapentin 300 mg twice daily has been sent to patient specified pharmacy.  All questions answered and support provided.       Any controlled substances utilized were prescribed in the context of palliative care. PDMP has been reviewed.    Time Total: 45 min  Visit consisted of counseling and education dealing with the complex and emotionally intense issues of symptom management and palliative care in the setting of serious and potentially life-threatening illness.Greater than 50%  of this time was spent counseling and coordinating care related to the above assessment and plan.  Alda Lea, AGPCNP-BC  Palliative Medicine Team/[facility] [place]

## 2022-07-13 NOTE — Telephone Encounter (Signed)
Attempted to call pt to discuss pain and medication regimen, no answer, LVM

## 2022-07-14 ENCOUNTER — Other Ambulatory Visit (HOSPITAL_COMMUNITY): Payer: Self-pay

## 2022-07-14 DIAGNOSIS — C3492 Malignant neoplasm of unspecified part of left bronchus or lung: Secondary | ICD-10-CM | POA: Diagnosis not present

## 2022-07-15 ENCOUNTER — Encounter: Payer: Self-pay | Admitting: Internal Medicine

## 2022-07-16 ENCOUNTER — Telehealth: Payer: Self-pay

## 2022-07-16 ENCOUNTER — Other Ambulatory Visit (HOSPITAL_COMMUNITY): Payer: Self-pay

## 2022-07-16 LAB — GUARDANT 360

## 2022-07-16 NOTE — Telephone Encounter (Signed)
Pt called to report that he was feeling "doped up" since starting gabapentin earlier this week. Per Lowella Bandy, NP, pt to reduce gabapentin to 1x a week, pt and wife verbalized understanding. Pt also reported having lost "85% of function in my left arm". Pt advised to seek emergency services since this is a new change and needs to be worked up urgently. Pt and wife verbalized understanding, have no further questions or concerns at this time.

## 2022-07-18 NOTE — Progress Notes (Unsigned)
[facility] OFFICE PROGRESS NOTE  Albert Screws, MD [address] [place] [address]  DIAGNOSIS: Stage IV (T3, N0, M1c) non-small cell lung cancer, adenocarcinoma presented with cavitary left lower lobe lung mass in addition to other pulmonary nodules and scattered hypermetabolic osseous metastatic disease involving the spine, ribs and bony pelvis as well as left proximal humeral pathologic fracture.    Molecular Biomarkers performed by [facility] [employer]ECTED [employer]TER[employer](S) / [name])      [name]  Afatinib, Dacomitinib, Erlotinib, Gefitinib, Osimertinib, Ramucirumab Neratinib   FGFR1Amplification  Erdafitinib, Lenvatinib, Nintedanib, Pazopanib, Pemigatinib, Ponatinib   [name] None  Repeated Moleculars by [facility]: ***  PRIOR THERAPY: 1) Palliative radiotherapy to the the left humerus under the care of Dr. Roselind Messier. Last treatment ~04/24/2019  2) Palliative radiation to the Right Pelvis/Ilium under the care of Dr. Roselind Messier. Last dose 05/19/22  CURRENT THERAPY: 1) Targeted therapy with Tagrisso 80 p.o. daily.  He started the first dose on April 21, 2019. Status post *** months of treatment.   2) Xgeva every 4 weeks, first dose 04/27/22  INTERVAL HISTORY: Albert Perez 72 y.o. male returns returns for follow-up visit accompanied by his wife.  The patient was last seen in the clinic by Dr. Arbutus Ped on 07/08/22. Unfortunately, the patient has been unwell recently.   In the fall of 2023,  At that point in time, the patient had a new metastatic lesion to the right iliac for which the patient recently completed palliative radiation under the care of Dr. Roselind Messier on 05/19/2022.   The patient has been experiencing a lot of pain recently. He is currently following palliative care. He is on Xtampza 18 mg TID and Oxycodone every 4 hours. He is on gabapentin but does not like the way it makes him feel.   The patietn has significant lower extremity  swelling bilaterally. He is compliant with compression stockings. He has some limitations with elevating his lower extremity secondary to pain. He is seeing cardiology. He is currently on **** of torsemide. We previously referred him to the lymphedema clinic.   He denies any fever, chills, or night sweats.  He lost weight since last being seen and reports that he has a poor appetite and has been drinking 3 boost per day.    He reports that his breathing is "great".  He denies any shortness of breath, cough, chest pain, or hemoptysis.  Denies any nausea or vomiting.  He intermittently follows with diarrhea alternating with constipation.  Denies any headache or visual changes.  Denies any rashes or skin changes.   At his last appointment, the patient was endorsing a new inguinal lump. Dr. Berna Bue arranged restaging PET scan and repeat molecular studies. He is here for evaluation and repeat blood work and fore a more detailed discussion about his current condition and recommended treatment options.   Last Xgeva 10/24....    MEDICAL HISTORY: Past Medical History:  Diagnosis Date   A-fib [facility])    PMH: 2000-2001   Arthritis    Cancer (HCC)    melanoma 2018   Diarrhea    due to medications   DVT (deep venous thrombosis) (HCC)    Left brachial vein DVT (01/30/19, following fracture)   Dysrhythmia    afib -    ETOH abuse    GERD (gastroesophageal reflux disease)    ocassional   History of radiation therapy    Right Hip 02/08/22-02/19/22-Dr. Antony Blackbird   History of radiation therapy  Right Pelvis-05/06/22-05/19/22- Dr. Antony Blackbird   Lung cancer [facility]) dx'd 02/26/2019   Metastatic cancer to bone [facility]) dx'd 02/26/2019   Sleep apnea    do not wear CPAP   Stroke (HCC) 2019   " mini"   Vertigo    Wears glasses     ALLERGIES:  has No Known Allergies.  MEDICATIONS:  Current Outpatient Medications  Medication Sig Dispense Refill   buPROPion (WELLBUTRIN XL) 150 MG 24 hr tablet Take 150  mg by mouth daily as needed (nicotine cravings).     carvedilol (COREG) 6.25 MG tablet TAKE 1 TABLET BY MOUTH TWICE A DAY 180 tablet 3   Cholecalciferol (VITAMIN D-3) 25 MCG (1000 UT) CAPS Take 1,000 Units by mouth daily.     ELIQUIS 5 MG TABS tablet TAKE 1 TABLET BY MOUTH TWICE A DAY 60 tablet 2   furosemide (LASIX) 20 MG tablet Take 20 mg by mouth daily.     gabapentin (NEURONTIN) 300 MG capsule Take 1 capsule (300 mg total) by mouth 2 (two) times daily. 60 capsule 0   loperamide (IMODIUM) 2 MG capsule Take 2 mg by mouth as needed for diarrhea or loose stools.     Magnesium 500 MG CAPS Take 1,000 mg by mouth daily.     Multiple Vitamin (MULTIVITAMIN PO) Take 1 tablet by mouth daily. Centrum     osimertinib mesylate (TAGRISSO) 80 MG tablet Take 1 tablet (80 mg total) by mouth daily. 60 tablet 3   OVER THE COUNTER MEDICATION Take 1 tablet by mouth daily. Standard Process, LivaPlex (Liver supplement)     oxyCODONE ER (XTAMPZA ER) 18 MG C12A Take 1 capsule by mouth every 8 (eight) hours. 60 capsule 0   oxyCODONE-acetaminophen (PERCOCET) 7.5-325 MG tablet Take 2 tablets by mouth every 4 (four) hours as needed for severe pain. 120 tablet 0   potassium chloride (KLOR-CON) 10 MEQ tablet Take 1 tablet (10 mEq total) by mouth daily. 90 tablet 1   rosuvastatin (CRESTOR) 5 MG tablet Take 5 mg by mouth daily.  2   torsemide (DEMADEX) 20 MG tablet Take 4 tablets ( 80 mg ) in morning and take 2 tablets ( 40 mg ) in afternoon 180 tablet 3   TURMERIC PO Take 1 capsule by mouth daily.     vitamin B-12 (CYANOCOBALAMIN) 1000 MCG tablet Take 1,000 mcg by mouth daily.     No current facility-administered medications for this visit.    SURGICAL HISTORY:  Past Surgical History:  Procedure Laterality Date   ANKLE FRACTURE SURGERY Right 2003   COLONOSCOPY  2018   ELBOW SURGERY Left 1964   INGUINAL HERNIA REPAIR Bilateral 11/12/2020   Procedure: BILATERAL OPEN INGUINAL HERNIA REPAIR WITH MESH;  Surgeon: Abigail Miyamoto, MD;  Location: [facility] OR;  Service: General;  Laterality: Bilateral;   melanoma removal  04/2017   Lt shoulder   RETINAL DETACHMENT SURGERY Right 1971   TONSILLECTOMY     VIDEO BRONCHOSCOPY WITH ENDOBRONCHIAL NAVIGATION N/A 03/28/2019   Procedure: VIDEO BRONCHOSCOPY WITH ENDOBRONCHIAL NAVIGATION and Biopsies;  Surgeon: Leslye Peer, MD;  Location: [facility] OR;  Service: Thoracic;  Laterality: N/A;    REVIEW OF SYSTEMS:   Review of Systems  Constitutional: Negative for appetite change, chills, fatigue, fever and unexpected weight change.  HENT:   Negative for mouth sores, nosebleeds, sore throat and trouble swallowing.   Eyes: Negative for eye problems and icterus.  Respiratory: Negative for cough, hemoptysis, shortness of breath and wheezing.  Cardiovascular: Negative for chest pain and leg swelling.  Gastrointestinal: Negative for abdominal pain, constipation, diarrhea, nausea and vomiting.  Genitourinary: Negative for bladder incontinence, difficulty urinating, dysuria, frequency and hematuria.   Musculoskeletal: Negative for back pain, gait problem, neck pain and neck stiffness.  Skin: Negative for itching and rash.  Neurological: Negative for dizziness, extremity weakness, gait problem, headaches, light-headedness and seizures.  Hematological: Negative for adenopathy. Does not bruise/bleed easily.  Psychiatric/Behavioral: Negative for confusion, depression and sleep disturbance. The patient is not nervous/anxious.     PHYSICAL EXAMINATION:  There were no vitals taken for this visit.  ECOG PERFORMANCE STATUS: {CHL ONC ECOG [id number]  Physical Exam  Constitutional: Oriented to person, place, and time and well-developed, well-nourished, and in no distress. No distress.  HENT:  Head: Normocephalic and atraumatic.  Mouth/Throat: Oropharynx is clear and moist. No oropharyngeal exudate.  Eyes: Conjunctivae are normal. Right eye exhibits no discharge. Left eye exhibits no  discharge. No scleral icterus.  Neck: Normal range of motion. Neck supple.  Cardiovascular: Normal rate, regular rhythm, normal heart sounds and intact distal pulses.   Pulmonary/Chest: Effort normal and breath sounds normal. No respiratory distress. No wheezes. No rales.  Abdominal: Soft. Bowel sounds are normal. Exhibits no distension and no mass. There is no tenderness.  Musculoskeletal: Normal range of motion. Exhibits no edema.  Lymphadenopathy:    No cervical adenopathy.  Neurological: Alert and oriented to person, place, and time. Exhibits normal muscle tone. Gait normal. Coordination normal.  Skin: Skin is warm and dry. No rash noted. Not diaphoretic. No erythema. No pallor.  Psychiatric: Mood, memory and judgment normal.  Vitals reviewed.  LABORATORY DATA: Lab Results  Component Value Date   WBC 8.1 07/08/2022   HGB 10.4 (L) 07/08/2022   HCT 30.9 (L) 07/08/2022   MCV 90.6 07/08/2022   PLT 242 07/08/2022      Chemistry      Component Value Date/Time   NA 138 07/08/2022 0849   NA 138 06/10/2022 1006   K 3.9 07/08/2022 0849   CL 99 07/08/2022 0849   CO2 32 07/08/2022 0849   BUN 20 07/08/2022 0849   BUN 17 06/10/2022 1006   CREATININE 1.25 (H) 07/08/2022 0849   CREATININE 1.32 (H) 06/23/2022 1439      Component Value Date/Time   CALCIUM 9.3 07/08/2022 0849   ALKPHOS 100 07/08/2022 0849   AST 16 07/08/2022 0849   AST 20 06/23/2022 1439   ALT 15 07/08/2022 0849   ALT 23 06/23/2022 1439   BILITOT 0.4 07/08/2022 0849   BILITOT 0.5 06/23/2022 1439       RADIOGRAPHIC STUDIES:  VAS [place] LOWER EXTREMITY VENOUS (DVT)  Result Date: 07/08/2022  Lower Venous DVT Study Patient Name:  REAKWON BARREN  Date of Exam:   07/08/2022 Medical Rec #: [id number]     Accession #:    [id number] Date of Birth: 06/18/1951     Patient Gender: M Patient Age:   75 years Exam Location:  [facility] Procedure:      VAS [place] LOWER EXTREMITY VENOUS (DVT) Referring Phys: [facility] MOHAMED  --------------------------------------------------------------------------------  Indications: Bilateral leg swelling for many months. Patient has erythema with blisters going up both calves. Patient being treated for metastatic lung cancer presently.  Risk Factors: Cancer metastatic lung cancer. Anticoagulation: Eliquis. Performing Technologist: Carlos American RVT, RDCS (AE), RDMS  Examination Guidelines: A complete evaluation includes B-mode imaging, spectral Doppler, color Doppler, and power Doppler as needed of all accessible portions of each  vessel. Bilateral testing is considered an integral part of a complete examination. Limited examinations for reoccurring indications may be performed as noted. The reflux portion of the exam is performed with the patient in reverse Trendelenburg.  +---------+---------------+---------+-----------+----------+--------------+ RIGHT    CompressibilityPhasicitySpontaneityPropertiesThrombus Aging +---------+---------------+---------+-----------+----------+--------------+ CFV      Full           Yes      Yes                                 +---------+---------------+---------+-----------+----------+--------------+ SFJ      Full           Yes      Yes                                 +---------+---------------+---------+-----------+----------+--------------+ FV Prox  Full           Yes      Yes                                 +---------+---------------+---------+-----------+----------+--------------+ FV Mid   Full           Yes      Yes                                 +---------+---------------+---------+-----------+----------+--------------+ FV DistalFull           Yes      Yes                                 +---------+---------------+---------+-----------+----------+--------------+ PFV      Full                                                        +---------+---------------+---------+-----------+----------+--------------+ POP       Full           Yes      Yes                                 +---------+---------------+---------+-----------+----------+--------------+ PTV                     Yes                                          +---------+---------------+---------+-----------+----------+--------------+ PERO                    Yes                                          +---------+---------------+---------+-----------+----------+--------------+ Gastroc  Full           Yes      Yes                                 +---------+---------------+---------+-----------+----------+--------------+  GSV      Full           Yes      Yes                                 +---------+---------------+---------+-----------+----------+--------------+   Right Technical Findings: Unable to compress calf veins due to increased girth and patient sensitivity. Color augmentation showed patent veins.  +---------+---------------+---------+-----------+----------+--------------+ LEFT     CompressibilityPhasicitySpontaneityPropertiesThrombus Aging +---------+---------------+---------+-----------+----------+--------------+ CFV      Full           Yes      Yes                                 +---------+---------------+---------+-----------+----------+--------------+ SFJ      Full           Yes      Yes                                 +---------+---------------+---------+-----------+----------+--------------+ FV Prox  Full           Yes      Yes                                 +---------+---------------+---------+-----------+----------+--------------+ FV Mid   Full           Yes      Yes                                 +---------+---------------+---------+-----------+----------+--------------+ FV DistalFull           Yes      Yes                                 +---------+---------------+---------+-----------+----------+--------------+ PFV      Full                                                         +---------+---------------+---------+-----------+----------+--------------+ POP      Full           Yes      Yes                                 +---------+---------------+---------+-----------+----------+--------------+ PTV                     Yes                                          +---------+---------------+---------+-----------+----------+--------------+ PERO                    Yes                                          +---------+---------------+---------+-----------+----------+--------------+  Gastroc  Full                                                        +---------+---------------+---------+-----------+----------+--------------+ GSV      Full           Yes      Yes                                 +---------+---------------+---------+-----------+----------+--------------+   Left Technical Findings: Unable to compress calf veins due to increased girth and patient sensitivity. Color augmentation showed patent veins.  Findings reported to Dr. [place] 12 :15 pm and Dr. Arbutus Ped thru EPIC email at 12:30 pm.  Summary: BILATERAL: - No evidence of deep vein thrombosis seen in the lower extremities, bilaterally. - RIGHT: - No cystic structure found in the popliteal fossa. - Largest groin lymph node measures 6.0 x 1.4 x 4.1 cm. Moderate superficial edema noted in calf. - Ultrasound characteristics of enlarged lymph nodes are noted in the groin.  LEFT: - No cystic structure found in the popliteal fossa. - Largest groin lymph node measures 5.5 x 1.4 x 2.9 cm. Moderate superficial edema noted in calf. - Ultrasound characteristics of enlarged lymph nodes noted in the groin.  *See table(s) above for measurements and observations. Electronically signed by Sherald Hess MD on 07/08/2022 at 3:32:55 PM.    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     He is status post palliative radiotherapy to the metastatic disease in the left arm under the care of Dr. Roselind Messier. This was completed in October 2020.   The patient is currently undergoing treatment with target therapy with Tagrisso 80 mg p.o. daily status post *** months of treatment.   She had palliative radiation to the right hip due to a new lesion under the care of Dr. Roselind Messier and this was completed on 05/19/2022.   He is undergoing monthly Xgeva due to his metastatic bone lesions. His next dose is due on ***  The patient was last seen by Dr. Arbutus Ped on 07/08/22. The patient has not been feeling well lately. Dr. Arbutus Ped recommended restaging PET scan. He also arranged for repeat molecular studies to ensure that the patient did not develop new mutations.   The patient was seen with Dr. Arbutus Ped. Dr. Arbutus Ped personally and independently reviewed the scan and discussed the resutls with the patient. The scan showed ***  Dr. Arbutus Ped had a lenthly discussuon with the patient and his wife about his current condition and recommended treatment options. Dr. Arbutus Ped recommends discontinuing tagrisso and ***.   ****Dr. Arbutus Ped had a lengthly discussion with the patient today about her current condition and treatment options. The patient was given the option of a referral to hospice/palliative vs. Treatment with systemic chemotherapy with carboplatin for an AUC of 5, Alimta 500 mg/m, and Keytruda 200 mg IV every 3 weeks.  The patient is interested in proceeding with systemic chemotherapy.  She is expected to start her first dose of this treatment on __.***  We discussed the adverse side effects of treatment  including but not limited to alopecia, myelosuppression, nausea and vomiting, peripheral neuropathy, liver or renal dysfunction as well as immunotherapy mediated adverse effects.   I will arrange for the patient to have a chemoeducation class prior to receiving her first cycle of chemotherapy.   We will arrange for the patient to have a B12 injection while in the clinic today.     I sent prescriptions for 1 mg folic acid p.o. daily as well as Compazine 10 mg every 6 hours as needed for nausea.   The patient will follow-up in 2 weeks for a one-week follow-up visit after completing his first cycle of chemotherapy.  The patient will continue to follow with palliative care for pain management. He also will continue to follow with cardiology regarding his leg swelling for which he is on lasix.   Brain MRI***  The patient was advised to call immediately if she has any concerning symptoms in the interval. The patient voices understanding of current disease status and treatment options and is in agreement with the current care plan. All questions were answered. The patient knows to call the clinic with any problems, questions or concerns. We can certainly see the patient much sooner if necessary       No orders of the defined types were placed in this encounter.    I spent {CHL ONC TIME VISIT - BOZWR:[id number]} counseling the patient face to face. The total time spent in the appointment was {CHL ONC TIME VISIT - EBBWN:[id number]}.  Redmond Whittley L Arzell Mcgeehan, PA-C 07/18/22

## 2022-07-19 ENCOUNTER — Encounter (HOSPITAL_COMMUNITY)
Admission: RE | Admit: 2022-07-19 | Discharge: 2022-07-19 | Disposition: A | Discharge status: Home / Self Care | Payer: Medicare Other | Source: Ambulatory Visit | Attending: Internal Medicine | Admitting: Internal Medicine

## 2022-07-19 ENCOUNTER — Encounter: Payer: Self-pay | Admitting: Internal Medicine

## 2022-07-19 DIAGNOSIS — D494 Neoplasm of unspecified behavior of bladder: Secondary | ICD-10-CM | POA: Diagnosis not present

## 2022-07-19 DIAGNOSIS — C3490 Malignant neoplasm of unspecified part of unspecified bronchus or lung: Secondary | ICD-10-CM | POA: Insufficient documentation

## 2022-07-19 DIAGNOSIS — N3289 Other specified disorders of bladder: Secondary | ICD-10-CM | POA: Diagnosis not present

## 2022-07-19 DIAGNOSIS — C7951 Secondary malignant neoplasm of bone: Secondary | ICD-10-CM | POA: Diagnosis not present

## 2022-07-19 LAB — GLUCOSE, CAPILLARY: Glucose-Capillary: 134 mg/dL — ABNORMAL HIGH (ref 70–99)

## 2022-07-19 MED ORDER — FLUDEOXYGLUCOSE F - 18 (FDG) INJECTION
8.5000 | Freq: Once | INTRAVENOUS | Status: AC | PRN
  Administered 2022-07-19: 8.46 via INTRAVENOUS

## 2022-07-20 ENCOUNTER — Other Ambulatory Visit: Payer: Self-pay

## 2022-07-20 ENCOUNTER — Inpatient Hospital Stay: Payer: Medicare Other | Admitting: Nurse Practitioner

## 2022-07-20 ENCOUNTER — Inpatient Hospital Stay: Payer: Medicare Other

## 2022-07-20 ENCOUNTER — Inpatient Hospital Stay (HOSPITAL_BASED_OUTPATIENT_CLINIC_OR_DEPARTMENT_OTHER): Payer: Medicare Other | Admitting: Physician Assistant

## 2022-07-20 ENCOUNTER — Encounter (HOSPITAL_COMMUNITY): Payer: Self-pay

## 2022-07-20 ENCOUNTER — Telehealth: Payer: Self-pay

## 2022-07-20 ENCOUNTER — Emergency Department (HOSPITAL_COMMUNITY): Payer: Medicare Other

## 2022-07-20 ENCOUNTER — Encounter: Payer: Self-pay | Admitting: Internal Medicine

## 2022-07-20 ENCOUNTER — Emergency Department (HOSPITAL_COMMUNITY)
Admission: EM | Admit: 2022-07-20 | Discharge: 2022-07-20 | Disposition: A | Discharge status: Home / Self Care | Payer: Medicare Other | Attending: Emergency Medicine | Admitting: Emergency Medicine

## 2022-07-20 DIAGNOSIS — Z7189 Other specified counseling: Secondary | ICD-10-CM | POA: Diagnosis not present

## 2022-07-20 DIAGNOSIS — Z85118 Personal history of other malignant neoplasm of bronchus and lung: Secondary | ICD-10-CM | POA: Diagnosis not present

## 2022-07-20 DIAGNOSIS — S0191XA Laceration without foreign body of unspecified part of head, initial encounter: Secondary | ICD-10-CM

## 2022-07-20 DIAGNOSIS — Z743 Need for continuous supervision: Secondary | ICD-10-CM | POA: Diagnosis not present

## 2022-07-20 DIAGNOSIS — G893 Neoplasm related pain (acute) (chronic): Secondary | ICD-10-CM

## 2022-07-20 DIAGNOSIS — X58XXXA Exposure to other specified factors, initial encounter: Secondary | ICD-10-CM | POA: Insufficient documentation

## 2022-07-20 DIAGNOSIS — C3492 Malignant neoplasm of unspecified part of left bronchus or lung: Secondary | ICD-10-CM | POA: Diagnosis not present

## 2022-07-20 DIAGNOSIS — I10 Essential (primary) hypertension: Secondary | ICD-10-CM | POA: Diagnosis not present

## 2022-07-20 DIAGNOSIS — S0990XA Unspecified injury of head, initial encounter: Secondary | ICD-10-CM | POA: Diagnosis not present

## 2022-07-20 DIAGNOSIS — R609 Edema, unspecified: Secondary | ICD-10-CM | POA: Diagnosis not present

## 2022-07-20 DIAGNOSIS — S0181XA Laceration without foreign body of other part of head, initial encounter: Secondary | ICD-10-CM | POA: Diagnosis not present

## 2022-07-20 DIAGNOSIS — I872 Venous insufficiency (chronic) (peripheral): Secondary | ICD-10-CM | POA: Insufficient documentation

## 2022-07-20 DIAGNOSIS — I878 Other specified disorders of veins: Secondary | ICD-10-CM

## 2022-07-20 DIAGNOSIS — R600 Localized edema: Secondary | ICD-10-CM | POA: Diagnosis not present

## 2022-07-20 LAB — COMPREHENSIVE METABOLIC PANEL
ALT: 34 U/L (ref 0–44)
AST: 27 U/L (ref 15–41)
Albumin: 3.1 g/dL — ABNORMAL LOW (ref 3.5–5.0)
Alkaline Phosphatase: 92 U/L (ref 38–126)
Anion gap: 8 (ref 5–15)
BUN: 33 mg/dL — ABNORMAL HIGH (ref 8–23)
CO2: 26 mmol/L (ref 22–32)
Calcium: 9.1 mg/dL (ref 8.9–10.3)
Chloride: 101 mmol/L (ref 98–111)
Creatinine, Ser: 1.01 mg/dL (ref 0.61–1.24)
GFR, Estimated: >60 mL/min (ref >60)
Glucose, Bld: 135 mg/dL — ABNORMAL HIGH (ref 70–99)
Potassium: 5 mmol/L (ref 3.5–5.1)
Sodium: 135 mmol/L (ref 135–145)
Total Bilirubin: 0.4 mg/dL (ref 0.3–1.2)
Total Protein: 6.9 g/dL (ref 6.5–8.1)

## 2022-07-20 LAB — CBC WITH DIFFERENTIAL/PLATELET
Abs Immature Granulocytes: 0.08 10*3/uL — ABNORMAL HIGH (ref 0.00–0.07)
Basophils Absolute: 0 10*3/uL (ref 0.0–0.1)
Basophils Relative: 0 %
Eosinophils Absolute: 0.5 10*3/uL (ref 0.0–0.5)
Eosinophils Relative: 5 %
HCT: 31.8 % — ABNORMAL LOW (ref 39.0–52.0)
Hemoglobin: 10.1 g/dL — ABNORMAL LOW (ref 13.0–17.0)
Immature Granulocytes: 1 %
Lymphocytes Relative: 8 %
Lymphs Abs: 0.8 10*3/uL (ref 0.7–4.0)
MCH: 29.1 pg (ref 26.0–34.0)
MCHC: 31.8 g/dL (ref 30.0–36.0)
MCV: 91.6 fL (ref 80.0–100.0)
Monocytes Absolute: 1.2 10*3/uL — ABNORMAL HIGH (ref 0.1–1.0)
Monocytes Relative: 12 %
Neutro Abs: 7.3 10*3/uL (ref 1.7–7.7)
Neutrophils Relative %: 74 %
Platelets: 294 10*3/uL (ref 150–400)
RBC: 3.47 MIL/uL — ABNORMAL LOW (ref 4.22–5.81)
RDW: 14.6 % (ref 11.5–15.5)
WBC: 9.9 10*3/uL (ref 4.0–10.5)
nRBC: 0 % (ref 0.0–0.2)

## 2022-07-20 LAB — BRAIN NATRIURETIC PEPTIDE: B Natriuretic Peptide: 97.9 pg/mL (ref 0.0–100.0)

## 2022-07-20 MED ORDER — OXYCODONE-ACETAMINOPHEN 5-325 MG PO TABS
1.0000 | ORAL_TABLET | Freq: Once | ORAL | Status: AC
  Administered 2022-07-20: 1 via ORAL

## 2022-07-20 MED ORDER — TRIAMCINOLONE 0.1 % CREAM:EUCERIN CREAM 1:1: 1.0000 | TOPICAL_CREAM | Freq: Two times a day (BID) | CUTANEOUS | 0 refills | Status: DC

## 2022-07-20 MED ORDER — TRIAMCINOLONE 0.1 % CREAM:EUCERIN CREAM 1:1
TOPICAL_CREAM | Freq: Once | CUTANEOUS | Status: AC
  Filled 2022-07-20: qty 1

## 2022-07-20 MED ORDER — OXYCODONE-ACETAMINOPHEN 5-325 MG PO TABS
ORAL_TABLET | ORAL | Status: AC
  Administered 2022-07-20: 1 via ORAL
  Filled 2022-07-20: qty 1

## 2022-07-20 NOTE — ED Provider Triage Note (Signed)
Emergency Medicine Provider Triage Evaluation Note  Albert Perez , a 72 y.o. male  was evaluated in triage.  Pt complains of worsening swelling and weeping of the lower extremities.  Became "severe" in September 2023.  Had a recent three-way phone conversation with oncologist and pulmonologist, who recommended he come to the hospital for likely admission and to have fluid drained from his legs.  Patient notes difficulty bearing weight.  Denies shortness of breath, chest pain or discomfort, headache, vision changes, or abdominal pain.  Denies numbness or tingling of the legs, or fevers or chills.  Also notes increased fatigue.  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  Has received several rounds of chemo and radiation.  Review of Systems  Positive:  Negative: See above  Physical Exam  BP 126/79   Pulse (!) 58   Temp (!) 97.5 F (36.4 C) (Oral)   Resp 16   SpO2 98%  Gen:   Awake, no distress   Resp:  Normal effort  MSK:   Moves extremities without difficulty with the exception of the lower extremities Other:  Lower extremities appear significantly swollen, 3+ pitting edema listed tenderness.  Also appear erythematous, typically weeping.  Socks wet.  Fluid appears clear.  Still able to wiggle toes.  Medical Decision Making  Medically screening exam initiated at 2:18 PM.  Appropriate orders placed.  [name] was informed that the remainder of the evaluation will be completed by another provider, this initial triage assessment does not replace that evaluation, and the importance of remaining in the ED until their evaluation is complete.  Discussed with triage nurse, pt to come back to room as soon as possible.   Cecil Cobbs, PA-C 07/20/22 1425

## 2022-07-20 NOTE — Discharge Instructions (Addendum)
You are seen in the ER for your leg swelling.  Please apply compression stockings every day, you may remove them at night.  Apply the cream prescribed to keep chances of infection low. At nighttime you should try and elevate the leg to your best capacity.    Follow-up with Dr. Arbutus Ped as planned tomorrow.  Return to the ER if you start having fevers, chills.  You have a superficial laceration to the head that was repaired with Steri-Strips.  The Steri-Strips will fall off on their own.  Do not apply any Vaseline products to that area.

## 2022-07-20 NOTE — ED Notes (Signed)
Patient on floor beside stretcher laying on right side. PA in room. Blood noted to head with abrasion. Pt on eliquis. Patient states he rolled out of bed trying to get up to use restroom.

## 2022-07-20 NOTE — Progress Notes (Deleted)
Palliative Medicine [facility]  [phone] [phone]) [phone]   Name: Albert Perez Date: 07/20/2022 MRN: [id number]  DOB: 12-12-1950  Patient Care Team: Henrine Screws, MD as PCP - General (Family Medicine) Regan Lemming, MD as PCP - Electrophysiology (Cardiology) Runell Gess, MD as PCP - Cardiology (Cardiology) Otho Darner, MD as Consulting Physician (Orthopedic Surgery) Pickenpack-Cousar, Arty Baumgartner, NP as Nurse Practitioner (Nurse Practitioner)    INTERVAL HISTORY: Albert Perez is a 72 y.o. 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 Recent scans  Palliative ask to see for.  Palliative ask to see for symptom management.  SOCIAL HISTORY:     reports that he has been smoking cigarettes. He has a 7.50 pack-year smoking history. He has never used smokeless tobacco. He reports current alcohol use of about 2.0 standard drinks of alcohol per week. He reports current drug use. Drug: Marijuana.  ADVANCE DIRECTIVES:    CODE STATUS:   PAST MEDICAL HISTORY: Past Medical History:  Diagnosis Date   A-fib [facility])    PMH: 2000-2001   Arthritis    Cancer (HCC)    melanoma 2018   Diarrhea    due to medications   DVT (deep venous thrombosis) (HCC)    Left brachial vein DVT (01/30/19, following fracture)   Dysrhythmia    afib -    ETOH abuse    GERD (gastroesophageal reflux disease)    ocassional   History of radiation therapy    Right Hip 02/08/22-02/19/22-Dr. Antony Blackbird   History of radiation therapy    Right Pelvis-05/06/22-05/19/22- Dr. 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  MEDICATIONS:  Current Outpatient Medications  Medication Sig Dispense Refill   buPROPion (WELLBUTRIN XL) 150 MG 24 hr tablet Take 150 mg by mouth daily as needed (nicotine cravings).     carvedilol (COREG) 6.25 MG tablet TAKE 1 TABLET BY MOUTH TWICE A DAY 180 tablet 3   Cholecalciferol (VITAMIN D-3) 25 MCG (1000 UT) CAPS Take 1,000 Units by mouth daily.     ELIQUIS 5 MG TABS tablet TAKE 1 TABLET BY MOUTH TWICE A DAY 60 tablet 2   furosemide (LASIX) 20 MG tablet Take 20 mg by mouth daily.     gabapentin (NEURONTIN) 300 MG capsule Take 1 capsule (300 mg total) by mouth 2 (two) times daily. 60 capsule 0   loperamide (IMODIUM) 2 MG capsule Take 2 mg by mouth as needed for diarrhea or loose stools.     Magnesium 500 MG CAPS Take 1,000 mg by mouth daily.     Multiple Vitamin (MULTIVITAMIN PO) Take 1 tablet by mouth daily. Centrum     osimertinib mesylate (TAGRISSO) 80 MG tablet Take 1 tablet (80 mg total) by mouth daily. 60 tablet 3   OVER THE COUNTER MEDICATION Take 1 tablet by mouth daily. Standard Process, LivaPlex (Liver supplement)     oxyCODONE ER (XTAMPZA ER) 18 MG C12A Take 1 capsule by mouth every 8 (eight) hours. 60 capsule 0   oxyCODONE-acetaminophen (PERCOCET) 7.5-325  MG tablet Take 2 tablets by mouth every 4 (four) hours as needed for severe pain. 120 tablet 0   potassium chloride (KLOR-CON) 10 MEQ tablet Take 1 tablet (10 mEq total) by mouth daily. 90 tablet 1   rosuvastatin (CRESTOR) 5 MG tablet Take 5 mg by mouth daily.  2   torsemide (DEMADEX) 20 MG tablet Take 4 tablets ( 80 mg ) in morning and take 2 tablets ( 40 mg ) in afternoon 180 tablet 3   TURMERIC PO Take 1 capsule by mouth daily.     vitamin B-12 (CYANOCOBALAMIN) 1000 MCG tablet Take 1,000 mcg by mouth daily.     No current facility-administered medications for this visit.    VITAL SIGNS: There were no vitals taken for this visit. There  were no vitals filed for this visit.   Estimated body mass index is 26.29 kg/m as calculated from the following:   Height as of 07/08/22: 5' 7.5" (1.715 m).   Weight as of 07/08/22: 170 lb 6.4 oz (77.3 kg).  Assessment NAD, ambulating with walker Normal breathing pattern RRR Bilateral lower extremity edema AAO x3  PERFORMANCE STATUS (ECOG) : 1 - Symptomatic but completely ambulatory  IMPRESSION:    We recently increased patient's oxycodone to 15 mg every 4 hours and Xtampza 18 mg 3 times daily.  He denies any unwanted side effects or constipation.   Given patient is describing pain as a more burning and sharp feeling education has been provided on the use of gabapentin in addition to other pain medications.  Education provided on side effects and when to contact the office or seek emergency assistance.  Patient and wife verbalized understanding.  We will continue to closely monitor and adjust as needed.   PLAN: Oxycodone 15 mg every 4-6 hours as needed for breakthrough pain Xtampza 18 mg every 8 hours  Miralax daily for bowel regimen  Toradol 30 mg IM today We will continue to closely monitor and adjust as needed. I will plan to see patient back in 3-4 weeks in collaboration with his other oncology/radiation appointments.  He knows to contact our office sooner if needed.   Patient expressed understanding and was in agreement with this plan. He also understands that He can call the clinic at any time with any questions, concerns, or complaints.        Any controlled substances utilized were prescribed in the context of palliative care. PDMP has been reviewed.    Time Total: 45 min   Visit consisted of counseling and education dealing with the complex and emotionally intense issues of symptom management and palliative care in the setting of serious and potentially life-threatening illness.Greater than 50%  of this time was spent counseling and coordinating care related to the above  assessment and plan.  Willette Alma, AGPCNP-BC  Palliative Medicine Team/[facility]    Willette Alma, AGPCNP-BC  Palliative Medicine Team/[facility]

## 2022-07-20 NOTE — Telephone Encounter (Signed)
This nurse reached out to this patient related to My Chart message. Patient states that he is having bad pain in bilateral lower extremities and it is making it difficult to walk.  Offered a phone visit or My Chart visit with the provider instead of coming in for visit.  Patient states he would really prefer to do a phone visit.  This nurse advised that provider will be made aware and he will receive a return call to let him know the time the provide will be available to call.  Patient acknowledged understanding.  No further questions or concerns noted at this time.

## 2022-07-20 NOTE — ED Provider Notes (Signed)
[facility] DEPT Provider Note   CSN: [id number] Arrival date & time: 07/20/22  1216     History  Chief Complaint  Patient presents with   Leg Swelling    SELASSIE SPATAFORE is a 72 y.o. male.  HPI    72 year old male comes in with chief complaint of leg swelling. Patient has history of A-fib, lung cancer with diffuse bony metastases, PE. He comes to the emergency room with chief complaint of leg swelling.  Patient states that over the last few days, the leg swelling has worsened.  He discontinued Lasix a week ago, as it did not appear that the Lasix were helping him with the swelling.  He has also noted increased redness and now weeping of his legs.  His oncologist requested that he come to the emergency room for further assessment.  Review of system is negative for any nausea, vomiting, fevers, chills.  Patient does not use compression stockings.  He states that it is difficult for him to elevate the legs, because of pain.  He does not want to be admitted.  He is hoping that we will be able to remove the fluid.  Home Medications Prior to Admission medications   Medication Sig Start Date End Date Taking? Authorizing Provider  Triamcinolone Acetonide (TRIAMCINOLONE 0.1 % CREAM : EUCERIN) CREA Apply 1 Application topically 2 (two) times daily. 07/20/22  Yes Varney Biles, MD  buPROPion (WELLBUTRIN XL) 150 MG 24 hr tablet Take 150 mg by mouth daily as needed (nicotine cravings). 10/23/20   [provider]  carvedilol (COREG) 6.25 MG tablet TAKE 1 TABLET BY MOUTH TWICE A DAY 10/12/21   Camnitz, Ocie Doyne, MD  Cholecalciferol (VITAMIN D-3) 25 MCG (1000 UT) CAPS Take 1,000 Units by mouth daily.    [provider]  ELIQUIS 5 MG TABS tablet TAKE 1 TABLET BY MOUTH TWICE A DAY 05/26/22   Heilingoetter, Cassandra L, PA-C  furosemide (LASIX) 20 MG tablet Take 20 mg by mouth daily. 06/26/22   [provider]  gabapentin (NEURONTIN) 300 MG  capsule Take 1 capsule (300 mg total) by mouth 2 (two) times daily. 07/13/22   Pickenpack-Cousar, [name], NP  loperamide (IMODIUM) 2 MG capsule Take 2 mg by mouth as needed for diarrhea or loose stools.    [provider]  Magnesium 500 MG CAPS Take 1,000 mg by mouth daily.    [provider]  Multiple Vitamin (MULTIVITAMIN PO) Take 1 tablet by mouth daily. Centrum    [provider]  osimertinib mesylate (TAGRISSO) 80 MG tablet Take 1 tablet (80 mg total) by mouth daily. 06/23/22   Curt Bears, MD  OVER THE COUNTER MEDICATION Take 1 tablet by mouth daily. Standard Process, LivaPlex (Liver supplement)    [provider]  oxyCODONE ER (XTAMPZA ER) 18 MG C12A Take 1 capsule by mouth every 8 (eight) hours. 07/08/22   Pickenpack-Cousar, [name], NP  oxyCODONE-acetaminophen (PERCOCET) 7.5-325 MG tablet Take 2 tablets by mouth every 4 (four) hours as needed for severe pain. 07/09/22   Pickenpack-Cousar, [name], NP  potassium chloride (KLOR-CON) 10 MEQ tablet Take 1 tablet (10 mEq total) by mouth daily. 06/23/22 12/20/22  Sande Rives E, PA-C  rosuvastatin (CRESTOR) 5 MG tablet Take 5 mg by mouth daily. 04/26/18   [provider]  torsemide (DEMADEX) 20 MG tablet Take 4 tablets ( 80 mg ) in morning and take 2 tablets ( 40 mg ) in afternoon 07/08/22   Martinique,  Demetria Pore, MD  TURMERIC PO Take 1 capsule by mouth daily.    [provider]  vitamin B-12 (CYANOCOBALAMIN) 1000 MCG tablet Take 1,000 mcg by mouth daily.    [provider]      Allergies    Patient has no known allergies.    Review of Systems   Review of Systems  All other systems reviewed and are negative.   Physical Exam Updated Vital Signs BP 133/62   Pulse 72   Temp 97.8 F (36.6 C) (Oral)   Resp 18   SpO2 100%  Physical Exam Vitals and nursing note reviewed.  Constitutional:      Appearance: He is well-developed.  HENT:     Head: Atraumatic.  Cardiovascular:      Rate and Rhythm: Normal rate.  Pulmonary:     Effort: Pulmonary effort is normal.  Musculoskeletal:        General: Swelling present.     Cervical back: Neck supple.     Comments: Patient has 3+ pitting edema in bilateral lower extremity, there is also erythema over the right leg distally.  Skin:    General: Skin is warm.  Neurological:     Mental Status: He is alert and oriented to person, place, and time.          ED Results / Procedures / Treatments   Labs (all labs ordered are listed, but only abnormal results are displayed) Labs Reviewed  CBC WITH DIFFERENTIAL/PLATELET - Abnormal; Notable for the following components:      Result Value   RBC 3.47 (*)    Hemoglobin 10.1 (*)    HCT 31.8 (*)    Monocytes Absolute 1.2 (*)    Abs Immature Granulocytes 0.08 (*)    All other components within normal limits  COMPREHENSIVE METABOLIC PANEL - Abnormal; Notable for the following components:   Glucose, Bld 135 (*)    BUN 33 (*)    Albumin 3.1 (*)    All other components within normal limits  BRAIN NATRIURETIC PEPTIDE    EKG EKG Interpretation  Date/Time:  Tuesday July 20 2022 14:32:28 EST Ventricular Rate:  62 PR Interval:  145 QRS Duration: 109 QT Interval:  448 QTC Calculation: 455 R Axis:   71 Text Interpretation: Sinus rhythm No acute changes No significant change since last tracing Confirmed by Derwood Kaplan [phone]) on 07/20/2022 9:34:02 PM  Radiology CT Head Wo Contrast  Result Date: 07/20/2022 CLINICAL DATA:  Head trauma, moderate-severe EXAM: CT HEAD WITHOUT CONTRAST TECHNIQUE: Contiguous axial images were obtained from the base of the skull through the vertex without intravenous contrast. RADIATION DOSE REDUCTION: This exam was performed according to the departmental dose-optimization program which includes automated exposure control, adjustment of the mA and/or kV according to patient size and/or use of iterative reconstruction technique. COMPARISON:   None Available. FINDINGS: Brain: No acute intracranial abnormality. Specifically, no hemorrhage, hydrocephalus, mass lesion, acute infarction, or significant intracranial injury. Vascular: No hyperdense vessel or unexpected calcification. Skull: No acute calvarial abnormality. Sinuses/Orbits: No acute findings Other: None IMPRESSION: Normal study. Electronically Signed   By: Charlett Nose M.D.   On: 07/20/2022 19:24   NM PET Image Restage (PS) Skull Base to Thigh (F-18 FDG)  Result Date: 07/19/2022 CLINICAL DATA:  Subsequent treatment strategy for non-small cell lung cancer. EXAM: NUCLEAR MEDICINE PET SKULL BASE TO THIGH TECHNIQUE: 8.5 mCi F-18 FDG was injected intravenously. Full-ring PET imaging was performed from the skull base to thigh after the radiotracer.  CT data was obtained and used for attenuation correction and anatomic localization. Fasting blood glucose: 134 mg/dl COMPARISON:  Chest abdomen pelvis CT 06/17/2022.  PET-CT 04/04/2019. FINDINGS: Mediastinal blood pool activity: SUV max 2.2 Liver activity: SUV max NA NECK: Choose 1 Incidental CT findings: None. CHEST: The patient's superior segment left lower lobe lesion (47/4) is markedly hypermetabolic with SUV max = 7.5. No hypermetabolic hilar or mediastinal lymphadenopathy. No hypermetabolic axillary lymphadenopathy. FDG accumulation in the shoulder regions bilaterally. Likely a combination of degenerative change and movement. Incidental CT findings: Coronary artery calcification is evident. Mild atherosclerotic calcification is noted in the wall of the thoracic aorta. Scattered tiny pulmonary nodule seen on previous CT are not hypermetabolic on PET imaging. ABDOMEN/PELVIS: No abnormal hypermetabolic activity within the liver, pancreas, adrenal glands, or spleen. No hypermetabolic lymph nodes in the abdomen or pelvis. Incidental CT findings: Small cyst noted right kidney with punctate nonobstructing stone evident upper pole right kidney. There is  moderate atherosclerotic calcification of the abdominal aorta without aneurysm. Right-sided bladder wall thickening appears more prominent than on prior CT of 06/17/2022 and is not well assessed on PET imaging due to excreted radiotracer in the bladder lumen. SKELETON: Hypermetabolic foci again noted in the cervical spine. The hypermetabolic proximal left humerus lesion seen previously has decreased in the interval. Hypermetabolic lytic lesions are seen in the left aspect of the T7 and T8 vertebral bodies including transverse processes. SUV max = 7.7 up to 9.3. This is progressive in the interval since prior PET-CT. The diffuse metastatic involvement of the bony right pelvis is hypermetabolic. 3.2 x 2.8 cm soft tissue component along the anterior right iliac bone described on the previous CT shows central photopenia consistent with necrosis with peripheral SUV max = 11. Hypermetabolic lesions are seen in the left sacrum, right acetabulum, and inferior right pubic ramus. There is hypermetabolism in the abductor muscles of the left proximal thigh, likely movement related although metastatic disease not excluded. Incidental CT findings: None. IMPRESSION: 1. Marked hypermetabolism in the superior segment left lower lobe pulmonary nodule consistent with known malignancy. 2. No hypermetabolic lymphadenopathy in the chest. 3. Interval progression of hypermetabolic bony metastases in the lower thoracic spine, right pelvis, and left sacrum when comparing to the previous PET-CT of 04/04/2019. These lesions were described as not markedly different on CT imaging compared to the more recent interval prior restaging CT scans. 4. Right-sided bladder wall thickening appears more prominent than on prior CT of 06/17/2022 and is not well assessed on PET imaging due to excreted radiotracer in the bladder lumen. This could be related to radiation therapy if the patient is undergoing radiation treatment to the right hip region.  Otherwise, bladder wall neoplasm would be a concern. 5. Hypermetabolism in the abductor muscles of the left proximal thigh, likely movement related although metastatic disease not excluded. Attention on follow-up recommended. 6.  Aortic Atherosclerosis (ICD10-I70.0). Electronically Signed   By: Kennith Center M.D.   On: 07/19/2022 11:16    Procedures .Marland KitchenLaceration Repair  Date/Time: 07/20/2022 11:40 PM  Performed by: Derwood Kaplan, MD Authorized by: Derwood Kaplan, MD   Consent:    Consent obtained:  Verbal   Consent given by:  Patient   Risks discussed:  Poor wound healing and need for additional repair Universal protocol:    Procedure explained and questions answered to patient or proxy's satisfaction: yes     Immediately prior to procedure, a time out was called: yes     Patient identity confirmed:  Arm band Laceration details:    Location:  Face   Face location:  Forehead   Length (cm):  3   Depth (mm):  1 Pre-procedure details:    Preparation:  Patient was prepped and draped in usual sterile fashion Exploration:    Limited defect created (wound extended): no     Hemostasis achieved with:  Direct pressure   Wound exploration: wound explored through full range of motion and entire depth of wound visualized     Wound extent: areolar tissue not violated, fascia not violated, no foreign body and no signs of injury     Contaminated: no   Treatment:    Area cleansed with:  Saline   Amount of cleaning:  Standard   Debridement:  None   Undermining:  None   Scar revision: no   Skin repair:    Repair method:  Steri-Strips and tissue adhesive Approximation:    Approximation:  Close Repair type:    Repair type:  Simple Post-procedure details:    Procedure completion:  Tolerated     Medications Ordered in ED Medications  triamcinolone 0.1 % cream : eucerin cream, 1:1 ( Topical Given 07/20/22 2219)  oxyCODONE-acetaminophen (PERCOCET/ROXICET) 5-325 MG per tablet 1 tablet (1  tablet Oral Given 07/20/22 2242)    ED Course/ Medical Decision Making/ A&P                             Medical Decision Making Risk Prescription drug management.   72 year old male with history of metastatic lung cancer comes in with chief complaint of leg pain and leg swelling.  Patient is noted to have bilateral pitting edema without orthopnea or PND complaints.  He has history of CAD, CHF, A-fib, metastatic lung cancer with mets to the sacrum and other bony areas.  Differential diagnosis considered for this patient includes venous stasis dermatitis, third spacing secondary to low albumin state, CHF, venous insufficiency, cellulitis.  It appears that patient's symptoms are not acute but rather chronic with slight worsening recently.  He is weeping in both of his legs.  He has evidence of dermatitis.  Clinically he is not having cellulitis.  I reviewed patient's records including cardiology notes, oncology notes and also patient's previous echocardiogram.  He has diastolic CHF.  It appears that he was trialed on Lasix followed by torsemide -and patient states that he had minimal success with it.  At this time, I do not think he needs admission to the hospital. Patient does not use compression stockings however.  I have advised that he purchase 1 and have prescribed him Eucerin-triamcinolone ointment to hopefully reduce infection.  Unfortunately, while patient was in the emergency room he had a slip and a fall.  He has a superficial laceration to his forehead.  He is on Eliquis.  CT scan of the brain was ordered, independently interpreted.  There is no evidence of brain bleed.  The superficial laceration was repaired with a DC of Steri-Strips.  Patient preferred adhesive over sutures.  Final Clinical Impression(s) / ED Diagnoses Final diagnoses:  Peripheral edema  Laceration of head without foreign body, unspecified part of head, initial encounter  Venous stasis    Rx / DC Orders ED  Discharge Orders          Ordered    Triamcinolone Acetonide (TRIAMCINOLONE 0.1 % CREAM : EUCERIN) CREA  2 times daily       Note to Pharmacy: 30 days  of supply please   07/20/22 2152              Derwood Kaplan, MD 07/20/22 2342

## 2022-07-20 NOTE — ED Triage Notes (Signed)
Patient BIB [facility] from home. Doctor told him to come in due to bilateral leg swelling and to come drain the fluid on his legs. Patient having pain when ambulating. Takes oxycodone for it.

## 2022-07-21 ENCOUNTER — Other Ambulatory Visit (HOSPITAL_COMMUNITY): Payer: Self-pay

## 2022-07-21 ENCOUNTER — Other Ambulatory Visit: Payer: Self-pay | Admitting: Medical Oncology

## 2022-07-21 ENCOUNTER — Ambulatory Visit: Payer: Medicare Other | Admitting: Internal Medicine

## 2022-07-21 ENCOUNTER — Encounter: Payer: Self-pay | Admitting: Medical Oncology

## 2022-07-21 ENCOUNTER — Ambulatory Visit: Payer: Medicare Other

## 2022-07-21 ENCOUNTER — Other Ambulatory Visit: Payer: Medicare Other

## 2022-07-21 ENCOUNTER — Telehealth: Payer: Self-pay | Admitting: Medical Oncology

## 2022-07-21 NOTE — Telephone Encounter (Signed)
Wound clinic referral -Pt notified that pt referred to Wound Care Clinic. I instructed Wound clinic staff to contact wife with appts.  Wound care clinic notified to contact wife with appts.   Pain- persists -pt (  wife ) stopped giving Arleigh gabapentin 1 week ago due to concerns of interaction with oxycodone. Message sent to Palliative Care.

## 2022-07-21 NOTE — Telephone Encounter (Signed)
[facility] declined  [facility] -wife said they declined  [facility] -Message left to return call.    Message sent to [facility] to see if they can accept pt.

## 2022-07-22 ENCOUNTER — Other Ambulatory Visit: Payer: Self-pay

## 2022-07-22 ENCOUNTER — Telehealth: Payer: Self-pay

## 2022-07-22 ENCOUNTER — Other Ambulatory Visit (HOSPITAL_COMMUNITY): Payer: Self-pay

## 2022-07-22 ENCOUNTER — Telehealth: Payer: Self-pay | Admitting: *Deleted

## 2022-07-22 DIAGNOSIS — I10 Essential (primary) hypertension: Secondary | ICD-10-CM

## 2022-07-22 DIAGNOSIS — G893 Neoplasm related pain (acute) (chronic): Secondary | ICD-10-CM

## 2022-07-22 DIAGNOSIS — I6381 Other cerebral infarction due to occlusion or stenosis of small artery: Secondary | ICD-10-CM

## 2022-07-22 DIAGNOSIS — C3490 Malignant neoplasm of unspecified part of unspecified bronchus or lung: Secondary | ICD-10-CM

## 2022-07-22 DIAGNOSIS — Z515 Encounter for palliative care: Secondary | ICD-10-CM

## 2022-07-22 MED ORDER — OXYCODONE-ACETAMINOPHEN 7.5-325 MG PO TABS
2.0000 | ORAL_TABLET | ORAL | 0 refills | Status: DC | PRN
  Filled 2022-07-22 – 2022-07-23 (×2): qty 120, 10d supply, fill #0

## 2022-07-22 NOTE — Telephone Encounter (Signed)
Pt wife called for refill of percocet, see new orders.

## 2022-07-22 NOTE — Progress Notes (Signed)
  Care Coordination  Outreach Note  07/22/2022 Name: AVANISH CERULLO MRN: [id number] DOB: [birth year]   Care Coordination Outreach Attempts: An unsuccessful telephone outreach was attempted today to offer the patient information about available care coordination services as a benefit of their health plan.   Referral received   Follow Up Plan:  Additional outreach attempts will be made to offer the patient care coordination information and services.   Encounter Outcome:  No Answer  Burman Nieves, CCMA Care Coordination Care Guide Direct Dial: [phone]

## 2022-07-23 ENCOUNTER — Other Ambulatory Visit (HOSPITAL_COMMUNITY): Payer: Self-pay

## 2022-07-23 ENCOUNTER — Other Ambulatory Visit: Payer: Self-pay

## 2022-07-23 ENCOUNTER — Ambulatory Visit (HOSPITAL_COMMUNITY): Payer: Medicare Other | Attending: Cardiovascular Disease

## 2022-07-23 DIAGNOSIS — I5031 Acute diastolic (congestive) heart failure: Secondary | ICD-10-CM

## 2022-07-23 DIAGNOSIS — E782 Mixed hyperlipidemia: Secondary | ICD-10-CM | POA: Insufficient documentation

## 2022-07-23 DIAGNOSIS — R931 Abnormal findings on diagnostic imaging of heart and coronary circulation: Secondary | ICD-10-CM | POA: Diagnosis not present

## 2022-07-23 DIAGNOSIS — M7989 Other specified soft tissue disorders: Secondary | ICD-10-CM | POA: Diagnosis not present

## 2022-07-23 DIAGNOSIS — I10 Essential (primary) hypertension: Secondary | ICD-10-CM | POA: Insufficient documentation

## 2022-07-23 LAB — ECHOCARDIOGRAM COMPLETE
Area-P 1/2: 3.87 cm2
S' Lateral: 3.85 cm

## 2022-07-23 NOTE — Progress Notes (Signed)
  Care Coordination  Outreach Note  07/23/2022 Name: Albert Perez MRN: [id number] DOB: [birth year]   Care Coordination Outreach Attempts: A second unsuccessful outreach was attempted today to offer the patient with information about available care coordination services as a benefit of their health plan.     Follow Up Plan:  Additional outreach attempts will be made to offer the patient care coordination information and services.   Encounter Outcome:  No Answer  Burman Nieves, CCMA Care Coordination Care Guide Direct Dial: [phone]

## 2022-07-25 ENCOUNTER — Encounter: Payer: Self-pay | Admitting: Internal Medicine

## 2022-07-26 ENCOUNTER — Telehealth: Payer: Self-pay

## 2022-07-26 ENCOUNTER — Other Ambulatory Visit: Payer: Self-pay

## 2022-07-26 NOTE — Progress Notes (Deleted)
Palliative Medicine [facility]  Telephone:(336) [phone] Fax:(336) [phone]   Name: Albert Perez Date: 07/26/2022 MRN: [id number]  DOB: [birth year]  Patient Care Team: Henrine Screws, MD as PCP - General (Family Medicine) Regan Lemming, MD as PCP - Electrophysiology (Cardiology) Runell Gess, MD as PCP - Cardiology (Cardiology) Otho Darner, MD as Consulting Physician (Orthopedic Surgery) Pickenpack-Cousar, Arty Baumgartner, NP as Nurse Practitioner (Nurse Practitioner)    INTERVAL HISTORY: Albert Perez is a 72 y.o. 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 Recent scans  Palliative ask to see for.  Palliative ask to see for symptom management.  SOCIAL HISTORY:     reports that he has been smoking cigarettes. He has a 7.50 pack-year smoking history. He has never used smokeless tobacco. He reports current alcohol use of about 2.0 standard drinks of alcohol per week. He reports current drug use. Drug: Marijuana.  ADVANCE DIRECTIVES:    CODE STATUS:   PAST MEDICAL HISTORY: Past Medical History:  Diagnosis Date   A-fib [facility])    PMH: 2000-2001   Arthritis    Cancer (HCC)    melanoma 2018   Diarrhea    due to medications   DVT (deep venous thrombosis) (HCC)    Left brachial vein DVT (01/30/19, following fracture)   Dysrhythmia    afib -    ETOH abuse    GERD (gastroesophageal reflux disease)    ocassional   History of radiation therapy    Right Hip 02/08/22-02/19/22-Dr. Antony Blackbird   History of radiation therapy    Right Pelvis-05/06/22-05/19/22- Dr. Antony Blackbird   Lung cancer [facility]) dx'd 02/26/2019   Metastatic cancer to bone (HCC) dx'd 02/26/2019   Sleep apnea    do not wear CPAP   Stroke (HCC) 2019   "  mini"   Vertigo    Wears glasses     ALLERGIES:  has No Known Allergies.  MEDICATIONS:  Current Outpatient Medications  Medication Sig Dispense Refill   buPROPion (WELLBUTRIN XL) 150 MG 24 hr tablet Take 150 mg by mouth daily as needed (nicotine cravings).     carvedilol (COREG) 6.25 MG tablet TAKE 1 TABLET BY MOUTH TWICE A DAY 180 tablet 3   Cholecalciferol (VITAMIN D-3) 25 MCG (1000 UT) CAPS Take 1,000 Units by mouth daily.     ELIQUIS 5 MG TABS tablet TAKE 1 TABLET BY MOUTH TWICE A DAY 60 tablet 2   furosemide (LASIX) 20 MG tablet Take 20 mg by mouth daily.     gabapentin (NEURONTIN) 300 MG capsule Take 1 capsule (300 mg total) by mouth 2 (two) times daily. 60 capsule 0   loperamide (IMODIUM) 2 MG capsule Take 2 mg by mouth as needed for diarrhea or loose stools.     Magnesium 500 MG CAPS Take 1,000 mg by mouth daily.     Multiple Vitamin (MULTIVITAMIN PO) Take 1 tablet by mouth daily. Centrum     osimertinib mesylate (TAGRISSO) 80 MG tablet Take 1 tablet (80 mg total) by mouth daily. 60 tablet 3   OVER THE COUNTER MEDICATION Take 1 tablet by mouth daily. Standard Process, LivaPlex (Liver supplement)     oxyCODONE ER (XTAMPZA ER) 18 MG C12A Take 1 capsule by mouth every 8 (eight) hours. 60 capsule 0   oxyCODONE-acetaminophen (PERCOCET) 7.5-325  MG tablet Take 2 tablets by mouth every 4 (four) hours as needed for severe pain. 120 tablet 0   potassium chloride (KLOR-CON) 10 MEQ tablet Take 1 tablet (10 mEq total) by mouth daily. 90 tablet 1   rosuvastatin (CRESTOR) 5 MG tablet Take 5 mg by mouth daily.  2   torsemide (DEMADEX) 20 MG tablet Take 4 tablets ( 80 mg ) in morning and take 2 tablets ( 40 mg ) in afternoon 180 tablet 3   Triamcinolone Acetonide (TRIAMCINOLONE 0.1 % CREAM : EUCERIN) CREA Apply 1 Application topically 2 (two) times daily. 1 each 0   TURMERIC PO Take 1 capsule by mouth daily.     vitamin B-12 (CYANOCOBALAMIN) 1000 MCG tablet Take 1,000 mcg by mouth daily.     No  current facility-administered medications for this visit.    VITAL SIGNS: There were no vitals taken for this visit. There were no vitals filed for this visit.   Estimated body mass index is 26.29 kg/m as calculated from the following:   Height as of 07/08/22: 5' 7.5" (1.715 m).   Weight as of 07/08/22: 170 lb 6.4 oz (77.3 kg).  Assessment NAD, ambulating with walker Normal breathing pattern RRR Bilateral lower extremity edema AAO x3  PERFORMANCE STATUS (ECOG) : 1 - Symptomatic but completely ambulatory  IMPRESSION:    We recently increased patient's oxycodone to 15 mg every 4 hours and Xtampza 18 mg 3 times daily.  He denies any unwanted side effects or constipation.   Given patient is describing pain as a more burning and sharp feeling education has been provided on the use of gabapentin in addition to other pain medications.  Education provided on side effects and when to contact the office or seek emergency assistance.  Patient and wife verbalized understanding.  We will continue to closely monitor and adjust as needed.   PLAN: Oxycodone 15 mg every 4-6 hours as needed for breakthrough pain Xtampza 18 mg every 8 hours  Miralax daily for bowel regimen  Toradol 30 mg IM today We will continue to closely monitor and adjust as needed. I will plan to see patient back in 3-4 weeks in collaboration with his other oncology/radiation appointments.  He knows to contact our office sooner if needed.   Patient expressed understanding and was in agreement with this plan. He also understands that He can call the clinic at any time with any questions, concerns, or complaints.        Any controlled substances utilized were prescribed in the context of palliative care. PDMP has been reviewed.    Time Total: 45 min   Visit consisted of counseling and education dealing with the complex and emotionally intense issues of symptom management and palliative care in the setting of serious and  potentially life-threatening illness.Greater than 50%  of this time was spent counseling and coordinating care related to the above assessment and plan.  Willette Alma, AGPCNP-BC  Palliative Medicine Team/[facility]    Willette Alma, AGPCNP-BC  Palliative Medicine Team/[facility]

## 2022-07-26 NOTE — Telephone Encounter (Signed)
Pt sister, [name], sent in some MyCHart messages reporting that the pt had significant swelling in his legs, and changes to his mentation and ambulation status. This RN and Lowella Bandy, NP, have attempted to contact pt and family by Mychart messages, and all telephone numbers listed in chart, no response and LVM. Will continue to attempt to contact pt and family.

## 2022-07-27 ENCOUNTER — Telehealth: Payer: Self-pay

## 2022-07-27 ENCOUNTER — Inpatient Hospital Stay: Payer: Medicare Other | Admitting: Nurse Practitioner

## 2022-07-27 ENCOUNTER — Other Ambulatory Visit: Payer: Self-pay | Admitting: Physician Assistant

## 2022-07-27 NOTE — Progress Notes (Signed)
  Care Coordination  Outreach Note  07/27/2022 Name: Albert Perez MRN: [id number] DOB: 1951-05-10   Care Coordination Outreach Attempts: A third unsuccessful outreach was attempted today to offer the patient with information about available care coordination services as a benefit of their health plan.   Follow Up Plan:  No further outreach attempts will be made at this time. We have been unable to contact the patient to offer or enroll patient in care coordination services  Encounter Outcome:  No Answer  Burman Nieves, [facility] Care Coordination Care Guide Direct Dial: [phone]

## 2022-07-27 NOTE — Telephone Encounter (Signed)
Pt missed appointment today, both this RN and Lowella Bandy, NP reached out to Timmie and his wife joyce, no calls answered, detailed VM left, no return calls at time of note composition. Oncology team also notified.

## 2022-07-27 NOTE — Telephone Encounter (Signed)
Pt left VM with another team member, VM  transferred to this RN phone, attempted to return call no answer, LVM requesting a return call and explaining the oncology team and the palliative team strongly encourage pt and family to go to ED for further evaluation or work up.

## 2022-07-28 ENCOUNTER — Inpatient Hospital Stay (HOSPITAL_BASED_OUTPATIENT_CLINIC_OR_DEPARTMENT_OTHER): Payer: Medicare Other | Admitting: Nurse Practitioner

## 2022-07-28 ENCOUNTER — Other Ambulatory Visit (HOSPITAL_COMMUNITY): Payer: Medicare Other

## 2022-07-28 ENCOUNTER — Telehealth: Payer: Self-pay

## 2022-07-28 ENCOUNTER — Encounter: Payer: Self-pay | Admitting: Nurse Practitioner

## 2022-07-28 DIAGNOSIS — Z515 Encounter for palliative care: Secondary | ICD-10-CM | POA: Diagnosis not present

## 2022-07-28 DIAGNOSIS — R530 Neoplastic (malignant) related fatigue: Secondary | ICD-10-CM

## 2022-07-28 DIAGNOSIS — G893 Neoplasm related pain (acute) (chronic): Secondary | ICD-10-CM | POA: Diagnosis not present

## 2022-07-28 DIAGNOSIS — R531 Weakness: Secondary | ICD-10-CM

## 2022-07-28 NOTE — Telephone Encounter (Signed)
This RN has attempted again to reach out to both pt and wife, no answer, message left with callback number.

## 2022-07-28 NOTE — Progress Notes (Signed)
[place]  Telephone:(336) [phone] Fax:(336) [phone]   Name: Albert Perez Date: 07/28/2022 MRN: [id number]  DOB: [birth year]  Patient Care Team: Curt Bears, MD as PCP - General (Oncology) Constance Haw, MD as PCP - Electrophysiology (Cardiology) Lorretta Harp, MD as PCP - Cardiology (Cardiology) Gentry Fitz, MD as Consulting Physician (Orthopedic Surgery) Alpha, [name], NP as Nurse Practitioner (Nurse Practitioner)    I connected with Albert Perez on 07/28/22 at  3:30 PM EST by phone and verified that I am speaking with the correct person using two identifiers.   I discussed the limitations, risks, security and privacy concerns of performing an evaluation and management service by telemedicine and the availability of in-person appointments. I also discussed with the patient that there may be a patient responsible charge related to this service. The patient expressed understanding and agreed to proceed.   Other persons participating in the visit and their role in the encounter: [place], RN   Patient's location: home  Provider's location: [facility]   Chief Complaint: follow up of symptom management   INTERVAL HISTORY: Albert Perez is a 72 y.o. 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 Recent scans  Palliative ask to see for.  Palliative ask to see for symptom management.  SOCIAL HISTORY:     reports that he has been smoking cigarettes. He has a 7.50 pack-year smoking history. He has never used smokeless tobacco. He reports current alcohol use of about 2.0 standard drinks of alcohol per week. He reports current drug use. Drug: Marijuana.  ADVANCE DIRECTIVES:    CODE STATUS:    PAST MEDICAL HISTORY: Past Medical History:  Diagnosis Date   A-fib [facility])    PMH: 2000-2001   Arthritis    Cancer ([place])    melanoma 2018   Diarrhea    due to medications   DVT (deep venous thrombosis) (HCC)    Left brachial vein DVT (01/30/19, following fracture)   Dysrhythmia    afib -    ETOH abuse    GERD (gastroesophageal reflux disease)    ocassional   History of radiation therapy    Right Hip 02/08/22-02/19/22-Dr. Gery Pray   History of radiation therapy    Right Pelvis-05/06/22-05/19/22- Dr. Gery Pray   Lung cancer [facility]) dx'd 02/26/2019   Metastatic cancer to bone ([place]) dx'd 02/26/2019   Sleep apnea    do not wear CPAP   Stroke ([place]) 2019   " mini"   Vertigo    Wears glasses     ALLERGIES:  has No Known Allergies.  MEDICATIONS:  Current Outpatient Medications  Medication Sig Dispense Refill   buPROPion (WELLBUTRIN XL) 150 MG 24 hr tablet Take 150 mg by mouth daily as needed (nicotine cravings).     carvedilol (COREG) 6.25 MG tablet TAKE 1 TABLET BY MOUTH TWICE A DAY 180 tablet 3   Cholecalciferol (VITAMIN D-3) 25 MCG (1000 UT) CAPS Take 1,000 Units by mouth daily.     ELIQUIS 5 MG TABS tablet TAKE 1 TABLET BY MOUTH TWICE A DAY 60 tablet 2   furosemide (LASIX) 20 MG tablet Take 20 mg by mouth daily.     gabapentin (NEURONTIN) 300 MG capsule Take 1 capsule (300 mg total) by mouth 2 (two) times daily. 60 capsule 0  loperamide (IMODIUM) 2 MG capsule Take 2 mg by mouth as needed for diarrhea or loose stools.     Magnesium 500 MG CAPS Take 1,000 mg by mouth daily.     Multiple Vitamin (MULTIVITAMIN PO) Take 1 tablet by mouth daily. Centrum     osimertinib mesylate (TAGRISSO) 80 MG tablet Take 1 tablet (80 mg total) by mouth daily. 60 tablet 3   OVER THE COUNTER MEDICATION Take 1 tablet by mouth daily. Standard Process, LivaPlex (Liver supplement)     oxyCODONE ER (XTAMPZA ER) 18 MG C12A Take 1 capsule by mouth every 8 (eight) hours. 60 capsule 0    oxyCODONE-acetaminophen (PERCOCET) 7.5-325 MG tablet Take 2 tablets by mouth every 4 (four) hours as needed for severe pain. 120 tablet 0   potassium chloride (KLOR-CON) 10 MEQ tablet Take 1 tablet (10 mEq total) by mouth daily. 90 tablet 1   rosuvastatin (CRESTOR) 5 MG tablet Take 5 mg by mouth daily.  2   torsemide (DEMADEX) 20 MG tablet Take 4 tablets ( 80 mg ) in morning and take 2 tablets ( 40 mg ) in afternoon 180 tablet 3   Triamcinolone Acetonide (TRIAMCINOLONE 0.1 % CREAM : EUCERIN) CREA Apply 1 Application topically 2 (two) times daily. 1 each 0   TURMERIC PO Take 1 capsule by mouth daily.     vitamin B-12 (CYANOCOBALAMIN) 1000 MCG tablet Take 1,000 mcg by mouth daily.     No current facility-administered medications for this visit.    VITAL SIGNS: There were no vitals taken for this visit. There were no vitals filed for this visit.   Estimated body mass index is 26.29 kg/m as calculated from the following:   Height as of 07/08/22: 5' 7.5" (1.715 m).   Weight as of 07/08/22: 170 lb 6.4 oz (77.3 kg).   PERFORMANCE STATUS (ECOG) : 2 - Symptomatic, <50% confined to bed  IMPRESSION: I was able to connect with Albert Perez by phone. Over the past week patient's sister, Albert Perez has been messaging on behalf of patient and wife with health concerns.   Fortunately we were able to connect today after days of being unable to reach patient or family by phone.   Albert Perez shares distress of patient's recent ER visit, sitting waiting area, and unfortunately patient falling out of bed attempting to get up unassisted. This resulted in a superficial laceration to his forehead. He was later discharged home with no significant findings. Wife shares family has been cautious about returning to emergency room after recommendations this week via voicemails and My Chart messages.   Albert Perez shares appreciation of how well patient is doing today. We reviewed events over the past week. Wife reports on Saturday  patient became somnolent and hallucinating. No significant events that could identify sudden changes. Lower extremities with significant swelling, weeping, and discoloration. Patient with increased weakness and inability to ambulate. Family has been assisting wife and patient over the past several days. His siblings purchased a bed to go in living area for convenience as well as a wheelchair. She shares this has been a huge help for his homecare needs. They have reached out to [facility] for additional personal care support.   Mrs. Kazmierski shares patient's brother had home supply of Santyl which they applied to his lower extremities along with wraps. She is much appreciative of their support. Albert Perez shares patient has made a significant recovery. Lower edema is significantly improved with only trace edema, patient is awake, alert.  Family was able to stand patient and assist with some ADLs. He has not had any further psychological changes over the past several days. He is able to sit upright and engage in appropriate discussions. Continues to be limited in his ability to ambulate despite support. His [place] members have pulled together and building a ramp to their home to allow for transport access.   We discussed Mr. Goynes's pain levels. Wife shares he has not complained of pain over the past 3-4 days. We discussed this is much appreciative given his significant pain and discomfort over the past months. She is asking him frequently regarding pain levels. He feels pain is manageable. He has not taken Xtampza since Monday. He is taking Oxycodone as needed however is no longer requiring around the clock. Mrs. Casagrande reports he has not required today.   Education provided on discontinuing pain medication. We agreed if patient is not in pain then it is best for him not to take. Education also provided on appropriateness of weaning medications versus sudden discontinuation and risk for withdrawals.  Thankfully she has not noticed any symptoms over the past 48 hours. She is aware of what signs to look for and when to seek emergency assistance if needed.   I had discussions with Mrs. Bushnell. Appreciative of family's support and the care provided to patient. I also emphasized if patient was to experience decline in health it is best to seek medical attention to prevent irreversible conditions and undergo proper work-up to identify any treatable and or preventable conditions. She verbalized understanding also again expressing hesitancy given recent events. She is aware that the medical team was concerned over recent events and inability to speak with either she or patient.   Patient is unable to come into office due to limited mobility at this time. They have agreed to maintain virtual contact until this improves. They are not interested in physical therapy as offered. Mrs. Bonet is hopeful once ramp is placed this will make it easier to get out of the home in addition to hopes he will continue to improve.   PLAN: Decrease Oxycodone 10 mg to every 8 hours as needed for breakthrough pain Discontinue Xtampza 18 mg.  Miralax daily for bowel regimen  We will continue to closely monitor and support as needed. I will plan to see patient back in 3-4 weeks in collaboration with his other oncology/radiation appointments. We have planned a telephone follow-up for next Wednesday. Patient and wife knows to contact our office sooner if needed.   Mrs. Flett expressed understanding and was in agreement with this plan. He also understands that He can call the clinic at any time with any questions, concerns, or complaints.    Time Total: 45 min   Visit consisted of counseling and education dealing with the complex and emotionally intense issues of symptom management and palliative care in the setting of serious and potentially life-threatening illness.Greater than 50%  of this time was spent counseling and  coordinating care related to the above assessment and plan.  Albert Perez, Albert Perez  Palliative Medicine Team/[facility]

## 2022-07-29 ENCOUNTER — Telehealth: Payer: Self-pay | Admitting: Pharmacy Technician

## 2022-07-29 ENCOUNTER — Encounter: Payer: Self-pay | Admitting: Internal Medicine

## 2022-07-29 ENCOUNTER — Other Ambulatory Visit (HOSPITAL_COMMUNITY): Payer: Self-pay

## 2022-07-29 NOTE — Telephone Encounter (Signed)
Oral Oncology Patient Advocate Encounter   Was successful in securing patient a $ 7,000 grant from [employer] to provide copayment coverage for NSCLC oral treatments.  The patient's out of pocket cost will be $10 monthly.    The billing information is as follows and has been shared with [place].   Member ID: [id number] Group ID: [id number] RxBin: [id number] Dates of Eligibility: 07/29/22 through 07/05/23   Albert Perez, [facility] Patient [place] Direct Number: [phone]  Fax: [phone]

## 2022-07-30 ENCOUNTER — Telehealth: Payer: Self-pay | Admitting: Physician Assistant

## 2022-07-30 ENCOUNTER — Other Ambulatory Visit (HOSPITAL_COMMUNITY): Payer: Self-pay

## 2022-07-30 ENCOUNTER — Other Ambulatory Visit: Payer: Self-pay | Admitting: Internal Medicine

## 2022-07-30 ENCOUNTER — Telehealth: Payer: Self-pay | Admitting: Pharmacy Technician

## 2022-07-30 ENCOUNTER — Telehealth: Payer: Self-pay

## 2022-07-30 ENCOUNTER — Other Ambulatory Visit: Payer: Self-pay

## 2022-07-30 ENCOUNTER — Telehealth: Payer: Self-pay | Admitting: Pharmacist

## 2022-07-30 DIAGNOSIS — C3492 Malignant neoplasm of unspecified part of left bronchus or lung: Secondary | ICD-10-CM

## 2022-07-30 MED ORDER — TEPOTINIB HCL 225 MG PO TABS
450.0000 mg | ORAL_TABLET | Freq: Every day | ORAL | 3 refills | Status: DC
  Filled 2022-07-30: qty 60, 30d supply, fill #0

## 2022-07-30 MED ORDER — TEPOTINIB HCL 225 MG PO TABS
450.0000 mg | ORAL_TABLET | Freq: Every day | ORAL | 3 refills | Status: DC
  Filled 2022-07-30 – 2022-08-03 (×2): qty 60, 30d supply, fill #0

## 2022-07-30 NOTE — Telephone Encounter (Addendum)
Called patient regarding results. Left detailed message for patient regarding results.----- Message from Warren Lacy, PA-C sent at 07/28/2022  4:03 PM EST ----- EF 50-55%.  No wall motion abnormalities.  Normal RV.  Mild mitral regurgitation.  IVC is normal size. Overall no significant structural abnormality. Plan --keep appointment next week to assess volume status and diuretic therapy

## 2022-07-30 NOTE — Telephone Encounter (Signed)
Oral Chemotherapy Pharmacist Encounter  Was able to touch base via phone with patient, patient's wife Blanch Media and patient's sister Hassan Rowan. They are currently hesistant about adding additional therapy of Tepmetko (tepotinib) to Mr. Wernick current regimen and would like to think this over more and also discuss with Cassie Heilingoetter, PA-C on 08/03/22. They are OK with phone visit with Cassie on 08/03/22 from 11-11:30 AM to discuss patient's current status.  Oral chemotherapy clinic will continue to follow.   Leron Croak, PharmD, BCPS, BCOP Hematology/Oncology Clinical Pharmacist [facility] and [place] [phone] 07/30/2022 4:21 PM

## 2022-07-30 NOTE — Telephone Encounter (Signed)
Oral Oncology Patient Advocate Encounter  Prior Authorization for Daune Perch has been approved.    PA# [id number] Effective dates: 07/30/22 through 07/05/23  Patients co-pay is $3,156.90.    Lady Deutscher, CPhT-Adv Oncology Pharmacy Patient [place] Direct Number: [phone]  Fax: [phone]

## 2022-07-30 NOTE — Telephone Encounter (Signed)
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  CMP and CBC w/ Diff from 07/20/22 assessed, no baseline dose adjustments required. Prescription dose and frequency assessed for appropriateness. Appropriate for therapy initiation.   Current medication list in Epic reviewed, no relevant/significant DDIs with Tepmetko identified.  Evaluated chart and no patient barriers to medication adherence noted.   Prescription has been e-scribed to the [facility] for benefits analysis and approval.  Oral Oncology Clinic will continue to follow for insurance authorization, copayment issues, initial counseling and start date.  Leron Croak, PharmD, BCPS, BCOP Hematology/Oncology Clinical Pharmacist [facility] [place] [phone] 07/30/2022 11:45 AM

## 2022-07-30 NOTE — Telephone Encounter (Signed)
Oral Oncology Patient Advocate Encounter   Received notification that prior authorization for Tepmetko is required.   PA submitted on 07/30/22 [place] [id number] Status is pending     Lady Deutscher, CPhT-Adv Oncology Pharmacy Patient [place] Direct Number: [phone]  Fax: [phone]

## 2022-07-30 NOTE — Telephone Encounter (Signed)
Dr. Julien Nordmann is going to start the patient on Tepotinib in addition to his [place]. I attempted to call the patient and his wife to discuss this. Unable to reach them. Pharmacy is working on getting this approved and will likely be in touch with him regarding chemo education. I left a detailed voicemail with this information.

## 2022-08-02 ENCOUNTER — Telehealth: Payer: Self-pay | Admitting: Cardiovascular Disease

## 2022-08-02 ENCOUNTER — Telehealth: Payer: Self-pay

## 2022-08-02 NOTE — Progress Notes (Unsigned)
[facility] OFFICE PROGRESS NOTE  Curt Bears, MD [address] [place] [address]  DIAGNOSIS: DIAGNOSIS: Stage IV (T3, N0, M1c) non-small cell lung cancer, adenocarcinoma presented with cavitary left lower lobe lung mass in addition to other pulmonary nodules and scattered hypermetabolic osseous metastatic disease involving the spine, ribs and bony pelvis as well as left proximal humeral pathologic fracture.    Molecular Biomarkers performed by [facility] DETECTED ALTERATION(S) / [name]      [id number]  Afatinib, Dacomitinib, Erlotinib, Gefitinib, Osimertinib, Ramucirumab Neratinib   FGFR1Amplification  Erdafitinib, Lenvatinib, Nintedanib, Pazopanib, Pemigatinib, Ponatinib   [id number] None   Repeated Moleculars by [facility]: Positive for MET Amplification    PRIOR THERAPY: 1) Palliative radiotherapy to the the left humerus under the care of Dr. Sondra Come. Last treatment ~04/24/2019  2) Palliative radiation to the Right Pelvis/Ilium under the care of Dr. Sondra Come. Last dose 05/19/22  CURRENT THERAPY: 1) Targeted therapy with Tagrisso 80 p.o. daily.  He started the first dose on April 21, 2019. Status post 39 months of treatment. Tepmetko 450 mg p.o. daily added starting from ***. First dose expected  2) Xgeva every 4 weeks, last dose 04/27/22  INTERVAL HISTORY: Albert Perez 72 y.o. male and I connected via a telephone encounter today. Unfortunately, the patients health seems to have declined significantly over the last few months. In summary, The patient has been on targeted treatment with Tagrisso for 39 months. He had been doing well until he was found to have a new metastatic lesion in the right iliac region for which he received pallaitive radiation, which was completed on 05/19/22.   He had been having a lot of persistent hip pain and decreased mobility secondary to pain. He has been seeing palliative care. He is currently  on Xtampza 18 mg TID and Oxycodone every 4 hours. He is on gabapentin but does not like the way it makes him feel so he ***stopped ***swelling????   He then started to develop debilitating bilateral lower extremity swelling and weeping. He was referred to cardiology, the lymphedema clinic, and now wound care to help with leg wrappings. He tries to be compliant with compression stockings. Cardiology prescribed torsemide.  Taking them?  Bathroom?  The patient had a PET scan performed on 07/19/2022.  Unfortunately, the scan showed evidence of progressive disease with progression of hypermetabolic bony metastases in the lower thoracic spine, right pelvis, and left sacrum.  At his last telephone visit on 07/20/2022, the patient was unable to make it into the clinic due to his extreme pain and swelling.  The patient is bedridden at this time.  He was subsequently instructed to go to the emergency room.  Dr. Julien Nordmann had discussed that while admitted to the hospital he would see him and discuss his treatment in more depth.  While in the emergency room it appears that admission was offered but the patient declined.  Dr. Julien Nordmann for recommended adding on additional treatment with [facility] for his met amplification mutation to use in conjunction with his Tagrisso.  However the patient talk to the pharmacist last week and he wanted to wait to start this until we can discuss in more detail.  Since returning home, the patient has been in continued poor health.  Slumped over in the wheelchair?  Cognition?  He denies any fever, chills, or night sweats.  Weight loss and appetite?  He denies any respiratory complaints and denies any shortness of breath, cough, chest pain, or hemoptysis.  Denies any nausea, or vomiting.  He has intermittent diarrhea and constipation.  Denies any headaches.  He denies any rashes or skin changes.  He is here for evaluation and more detailed discussion about his current condition and recommended  treatment options.    MEDICAL HISTORY: Past Medical History:  Diagnosis Date   A-fib [facility])    PMH: 2000-2001   Arthritis    Cancer ([place])    melanoma 2018   Diarrhea    due to medications   DVT (deep venous thrombosis) (H[facility])    Left brachial vein DVT (01/30/19, following fracture)   Dysrhythmia    afib -    ETOH abuse    GERD (gastroesophageal reflux disease)    ocassional   History of radiation therapy    Right Hip 02/08/22-02/19/22-Dr. Gery Pray   History of radiation therapy    Right Pelvis-05/06/22-05/19/22- Dr. Gery Pray   Lung cancer [facility]) dx'd 02/26/2019   Metastatic cancer to bone ([place]) dx'd 02/26/2019   Sleep apnea    do not wear CPAP   Stroke ([place]) 2019   " mini"   Vertigo    Wears glasses     ALLERGIES:  has No Known Allergies.  MEDICATIONS:  Current Outpatient Medications  Medication Sig Dispense Refill   buPROPion (WELLBUTRIN XL) 150 MG 24 hr tablet Take 150 mg by mouth daily as needed (nicotine cravings).     carvedilol (COREG) 6.25 MG tablet TAKE 1 TABLET BY MOUTH TWICE A DAY 180 tablet 3   Cholecalciferol (VITAMIN D-3) 25 MCG (1000 UT) CAPS Take 1,000 Units by mouth daily.     ELIQUIS 5 MG TABS tablet TAKE 1 TABLET BY MOUTH TWICE A DAY 60 tablet 2   furosemide (LASIX) 20 MG tablet Take 20 mg by mouth daily.     gabapentin (NEURONTIN) 300 MG capsule Take 1 capsule (300 mg total) by mouth 2 (two) times daily. 60 capsule 0   loperamide (IMODIUM) 2 MG capsule Take 2 mg by mouth as needed for diarrhea or loose stools.     Magnesium 500 MG CAPS Take 1,000 mg by mouth daily.     Multiple Vitamin (MULTIVITAMIN PO) Take 1 tablet by mouth daily. Centrum     osimertinib mesylate (TAGRISSO) 80 MG tablet Take 1 tablet (80 mg total) by mouth daily. 60 tablet 3   OVER THE COUNTER MEDICATION Take 1 tablet by mouth daily. Standard Process, LivaPlex (Liver supplement)     oxyCODONE ER (XTAMPZA ER) 18 MG C12A Take 1 capsule by mouth every 8 (eight) hours. 60 capsule  0   oxyCODONE-acetaminophen (PERCOCET) 7.5-325 MG tablet Take 2 tablets by mouth every 4 (four) hours as needed for severe pain. 120 tablet 0   potassium chloride (KLOR-CON) 10 MEQ tablet Take 1 tablet (10 mEq total) by mouth daily. 90 tablet 1   rosuvastatin (CRESTOR) 5 MG tablet Take 5 mg by mouth daily.  2   tepotinib hcl (TEPMETKO) 225 MG tablet Take 2 tablets (450 mg total) by mouth daily. Take with food. 60 tablet 3   torsemide (DEMADEX) 20 MG tablet Take 4 tablets ( 80 mg ) in morning and take 2 tablets ( 40 mg ) in afternoon 180 tablet 3   Triamcinolone Acetonide (TRIAMCINOLONE 0.1 % CREAM : EUCERIN) CREA Apply 1 Application topically 2 (two) times daily. 1 each 0   TURMERIC PO Take 1 capsule by mouth daily.     vitamin B-12 (CYANOCOBALAMIN) 1000 MCG tablet Take 1,000 mcg by  mouth daily.     No current facility-administered medications for this visit.    SURGICAL HISTORY:  Past Surgical History:  Procedure Laterality Date   ANKLE FRACTURE SURGERY Right 2003   COLONOSCOPY  2018   ELBOW SURGERY Left 1964   INGUINAL HERNIA REPAIR Bilateral 11/12/2020   Procedure: BILATERAL OPEN INGUINAL HERNIA REPAIR WITH MESH;  Surgeon: Coralie Keens, MD;  Location: [facility];  Service: General;  Laterality: Bilateral;   melanoma removal  04/2017   Lt shoulder   RETINAL DETACHMENT SURGERY Right 1971   TONSILLECTOMY     VIDEO BRONCHOSCOPY WITH ENDOBRONCHIAL NAVIGATION N/A 03/28/2019   Procedure: VIDEO BRONCHOSCOPY WITH ENDOBRONCHIAL NAVIGATION and Biopsies;  Surgeon: Collene Gobble, MD;  Location: [facility] OR;  Service: Thoracic;  Laterality: N/A;    REVIEW OF SYSTEMS:   Review of Systems  Constitutional: Negative for appetite change, chills, fatigue, fever and unexpected weight change.  HENT:   Negative for mouth sores, nosebleeds, sore throat and trouble swallowing.   Eyes: Negative for eye problems and icterus.  Respiratory: Negative for cough, hemoptysis, shortness of breath and wheezing.    Cardiovascular: Negative for chest pain and leg swelling.  Gastrointestinal: Negative for abdominal pain, constipation, diarrhea, nausea and vomiting.  Genitourinary: Negative for bladder incontinence, difficulty urinating, dysuria, frequency and hematuria.   Musculoskeletal: Negative for back pain, gait problem, neck pain and neck stiffness.  Skin: Negative for itching and rash.  Neurological: Negative for dizziness, extremity weakness, gait problem, headaches, light-headedness and seizures.  Hematological: Negative for adenopathy. Does not bruise/bleed easily.  Psychiatric/Behavioral: Negative for confusion, depression and sleep disturbance. The patient is not nervous/anxious.     PHYSICAL EXAMINATION:  There were no vitals taken for this visit.  ECOG PERFORMANCE STATUS: {CHL ONC ECOG [id number]  Physical Exam  Constitutional: Oriented to person, place, and time and well-developed, well-nourished, and in no distress. No distress.  HENT:  Head: Normocephalic and atraumatic.  Mouth/Throat: Oropharynx is clear and moist. No oropharyngeal exudate.  Eyes: Conjunctivae are normal. Right eye exhibits no discharge. Left eye exhibits no discharge. No scleral icterus.  Neck: Normal range of motion. Neck supple.  Cardiovascular: Normal rate, regular rhythm, normal heart sounds and intact distal pulses.   Pulmonary/Chest: Effort normal and breath sounds normal. No respiratory distress. No wheezes. No rales.  Abdominal: Soft. Bowel sounds are normal. Exhibits no distension and no mass. There is no tenderness.  Musculoskeletal: Normal range of motion. Exhibits no edema.  Lymphadenopathy:    No cervical adenopathy.  Neurological: Alert and oriented to person, place, and time. Exhibits normal muscle tone. Gait normal. Coordination normal.  Skin: Skin is warm and dry. No rash noted. Not diaphoretic. No erythema. No pallor.  Psychiatric: Mood, memory and judgment normal.  Vitals  reviewed.  LABORATORY DATA: Lab Results  Component Value Date   WBC 9.9 07/20/2022   HGB 10.1 (L) 07/20/2022   HCT 31.8 (L) 07/20/2022   MCV 91.6 07/20/2022   PLT 294 07/20/2022      Chemistry      Component Value Date/Time   NA 135 07/20/2022 1415   NA 138 06/10/2022 1006   K 5.0 07/20/2022 1415   CL 101 07/20/2022 1415   CO2 26 07/20/2022 1415   BUN 33 (H) 07/20/2022 1415   BUN 17 06/10/2022 1006   CREATININE 1.01 07/20/2022 1415   CREATININE 1.32 (H) 06/23/2022 1439      Component Value Date/Time   CALCIUM 9.1 07/20/2022 1415   ALKPHOS 92  07/20/2022 1415   AST 27 07/20/2022 1415   AST 20 06/23/2022 1439   ALT 34 07/20/2022 1415   ALT 23 06/23/2022 1439   BILITOT 0.4 07/20/2022 1415   BILITOT 0.5 06/23/2022 1439       RADIOGRAPHIC STUDIES:  ECHOCARDIOGRAM COMPLETE  Result Date: 07/23/2022    ECHOCARDIOGRAM REPORT   Patient Name:   Albert Perez  Date of Exam: 07/23/2022 Medical Rec #:  [id number]     Height:       67.5 in Accession #:    [id number]    Weight:       170.4 lb Date of Birth:  18-Mar-1951     BSA:          1.900 m Patient Age:    50 years      BP:           136/68 mmHg Patient Gender: M             HR:           106 bpm. Exam Location:  [place] Procedure: 2D Echo, Cardiac Doppler and Color Doppler Indications:    Z66.29 Acute diastolic (congestive) heart failure  History:        Patient has prior history of Echocardiogram examinations, most                 recent 11/16/2017. Stroke; Risk Factors:Hypertension,                 Dyslipidemia and Sleep Apnea. Palpitations. SVT. Elevated                 coronary artery calcium score. Lower extremity edema. Lung                 cancer metastatic to bone. Pulmonary emboli.  Sonographer:    Diamond Nickel RCS Referring Phys: [id number] Warren Lacy  Sonographer Comments: This was a technically difficult study. Patient unable to tolerate positioning for study due to pain and discomfort. IMPRESSIONS  1. Left  ventricular ejection fraction, by estimation, is 50 to 55%. The left ventricle has low normal function. The left ventricle has no regional wall motion abnormalities. Left ventricular diastolic parameters are indeterminate.  2. Right ventricular systolic function is normal. The right ventricular size is normal.  3. The mitral valve is abnormal. Mild mitral valve regurgitation. No evidence of mitral stenosis.  4. The aortic valve is tricuspid. Aortic valve regurgitation is not visualized. No aortic stenosis is present.  5. The inferior vena cava is normal in size with greater than 50% respiratory variability, suggesting right atrial pressure of 3 mmHg. FINDINGS  Left Ventricle: Left ventricular ejection fraction, by estimation, is 50 to 55%. The left ventricle has low normal function. The left ventricle has no regional wall motion abnormalities. The left ventricular internal cavity size was normal in size. There is no left ventricular hypertrophy. Left ventricular diastolic parameters are indeterminate. Right Ventricle: The right ventricular size is normal. No increase in right ventricular wall thickness. Right ventricular systolic function is normal. Left Atrium: Left atrial size was normal in size. Right Atrium: Right atrial size was normal in size. Pericardium: There is no evidence of pericardial effusion. Mitral Valve: The mitral valve is abnormal. There is mild thickening of the mitral valve leaflet(s). There is mild calcification of the mitral valve leaflet(s). Mild mitral annular calcification. Mild mitral valve regurgitation. No evidence of mitral valve stenosis. Tricuspid Valve: The tricuspid valve is normal in  structure. Tricuspid valve regurgitation is not demonstrated. No evidence of tricuspid stenosis. Aortic Valve: The aortic valve is tricuspid. Aortic valve regurgitation is not visualized. No aortic stenosis is present. Pulmonic Valve: The pulmonic valve was normal in structure. Pulmonic valve  regurgitation is mild. No evidence of pulmonic stenosis. Aorta: The aortic root is normal in size and structure. Venous: The inferior vena cava is normal in size with greater than 50% respiratory variability, suggesting right atrial pressure of 3 mmHg. IAS/Shunts: No atrial level shunt detected by color flow Doppler.  LEFT VENTRICLE PLAX 2D LVIDd:         5.40 cm   Diastology LVIDs:         3.85 cm   LV e' medial:    12.30 cm/s LV PW:         1.00 cm   LV E/e' medial:  7.4 LV IVS:        0.90 cm   LV e' lateral:   14.90 cm/s LVOT diam:     2.40 cm   LV E/e' lateral: 6.1 LV SV:         118 LV SV Index:   62 LVOT Area:     4.52 cm  RIGHT VENTRICLE RV S prime:     18.70 cm/s LEFT ATRIUM         Index LA diam:    3.80 cm 2.00 cm/m  AORTIC VALVE LVOT Vmax:   117.00 cm/s LVOT Vmean:  70.200 cm/s LVOT VTI:    0.261 m  AORTA Ao Root diam: 3.50 cm Ao Asc diam:  3.40 cm MITRAL VALVE MV Area (PHT): 3.87 cm    SHUNTS MV Decel Time: 196 msec    Systemic VTI:  0.26 m MV E velocity: 91.20 cm/s  Systemic Diam: 2.40 cm MV A velocity: 75.50 cm/s MV E/A ratio:  1.21 Jenkins Rouge MD Electronically signed by Jenkins Rouge MD Signature Date/Time: 07/23/2022/2:49:01 PM    Final    CT Head Wo Contrast  Result Date: 07/20/2022 CLINICAL DATA:  Head trauma, moderate-severe EXAM: CT HEAD WITHOUT CONTRAST TECHNIQUE: Contiguous axial images were obtained from the base of the skull through the vertex without intravenous contrast. RADIATION DOSE REDUCTION: This exam was performed according to the departmental dose-optimization program which includes automated exposure control, adjustment of the mA and/or kV according to patient size and/or use of iterative reconstruction technique. COMPARISON:  None Available. FINDINGS: Brain: No acute intracranial abnormality. Specifically, no hemorrhage, hydrocephalus, mass lesion, acute infarction, or significant intracranial injury. Vascular: No hyperdense vessel or unexpected calcification. Skull: No  acute calvarial abnormality. Sinuses/Orbits: No acute findings Other: None IMPRESSION: Normal study. Electronically Signed   By: Rolm Baptise M.D.   On: 07/20/2022 19:24   NM PET Image Restage (PS) Skull Base to Thigh (F-18 FDG)  Result Date: 07/19/2022 CLINICAL DATA:  Subsequent treatment strategy for non-small cell lung cancer. EXAM: NUCLEAR MEDICINE PET SKULL BASE TO THIGH TECHNIQUE: 8.5 mCi F-18 FDG was injected intravenously. Full-ring PET imaging was performed from the skull base to thigh after the radiotracer. CT data was obtained and used for attenuation correction and anatomic localization. Fasting blood glucose: 134 mg/dl COMPARISON:  Chest abdomen pelvis CT 06/17/2022.  PET-CT 04/04/2019. FINDINGS: Mediastinal blood pool activity: SUV max 2.2 Liver activity: SUV max NA NECK: Choose 1 Incidental CT findings: None. CHEST: The patient's superior segment left lower lobe lesion (47/4) is markedly hypermetabolic with SUV max = 7.5. No hypermetabolic hilar or mediastinal lymphadenopathy. No hypermetabolic  axillary lymphadenopathy. FDG accumulation in the shoulder regions bilaterally. Likely a combination of degenerative change and movement. Incidental CT findings: Coronary artery calcification is evident. Mild atherosclerotic calcification is noted in the wall of the thoracic aorta. Scattered tiny pulmonary nodule seen on previous CT are not hypermetabolic on PET imaging. ABDOMEN/PELVIS: No abnormal hypermetabolic activity within the liver, pancreas, adrenal glands, or spleen. No hypermetabolic lymph nodes in the abdomen or pelvis. Incidental CT findings: Small cyst noted right kidney with punctate nonobstructing stone evident upper pole right kidney. There is moderate atherosclerotic calcification of the abdominal aorta without aneurysm. Right-sided bladder wall thickening appears more prominent than on prior CT of 06/17/2022 and is not well assessed on PET imaging due to excreted radiotracer in the bladder  lumen. SKELETON: Hypermetabolic foci again noted in the cervical spine. The hypermetabolic proximal left humerus lesion seen previously has decreased in the interval. Hypermetabolic lytic lesions are seen in the left aspect of the T7 and T8 vertebral bodies including transverse processes. SUV max = 7.7 up to 9.3. This is progressive in the interval since prior PET-CT. The diffuse metastatic involvement of the bony right pelvis is hypermetabolic. 3.2 x 2.8 cm soft tissue component along the anterior right iliac bone described on the previous CT shows central photopenia consistent with necrosis with peripheral SUV max = 11. Hypermetabolic lesions are seen in the left sacrum, right acetabulum, and inferior right pubic ramus. There is hypermetabolism in the abductor muscles of the left proximal thigh, likely movement related although metastatic disease not excluded. Incidental CT findings: None. IMPRESSION: 1. Marked hypermetabolism in the superior segment left lower lobe pulmonary nodule consistent with known malignancy. 2. No hypermetabolic lymphadenopathy in the chest. 3. Interval progression of hypermetabolic bony metastases in the lower thoracic spine, right pelvis, and left sacrum when comparing to the previous PET-CT of 04/04/2019. These lesions were described as not markedly different on CT imaging compared to the more recent interval prior restaging CT scans. 4. Right-sided bladder wall thickening appears more prominent than on prior CT of 06/17/2022 and is not well assessed on PET imaging due to excreted radiotracer in the bladder lumen. This could be related to radiation therapy if the patient is undergoing radiation treatment to the right hip region. Otherwise, bladder wall neoplasm would be a concern. 5. Hypermetabolism in the abductor muscles of the left proximal thigh, likely movement related although metastatic disease not excluded. Attention on follow-up recommended. 6.  Aortic Atherosclerosis  (ICD10-I70.0). Electronically Signed   By: Misty Stanley M.D.   On: 07/19/2022 11:16   VAS [place] LOWER EXTREMITY VENOUS (DVT)  Result Date: 07/08/2022  Lower Venous DVT Study Patient Name:  Albert Perez  Date of Exam:   07/08/2022 Medical Rec #: [id number]     Accession #:    [id number] Date of Birth: 03/02/1951     Patient Gender: M Patient Age:   72 years Exam Location:  [facility] Procedure:      VAS [place] LOWER EXTREMITY VENOUS (DVT) Referring Phys: [facility] MOHAMED --------------------------------------------------------------------------------  Indications: Bilateral leg swelling for many months. Patient has erythema with blisters going up both calves. Patient being treated for metastatic lung cancer presently.  Risk Factors: Cancer metastatic lung cancer. Anticoagulation: Eliquis. Performing Technologist: Salvadore Dom RVT, RDCS (AE), RDMS  Examination Guidelines: A complete evaluation includes B-mode imaging, spectral Doppler, color Doppler, and power Doppler as needed of all accessible portions of each vessel. Bilateral testing is considered an integral part of a complete examination. Limited examinations  for reoccurring indications may be performed as noted. The reflux portion of the exam is performed with the patient in reverse Trendelenburg.  +---------+---------------+---------+-----------+----------+--------------+ RIGHT    CompressibilityPhasicitySpontaneityPropertiesThrombus Aging +---------+---------------+---------+-----------+----------+--------------+ CFV      Full           Yes      Yes                                 +---------+---------------+---------+-----------+----------+--------------+ SFJ      Full           Yes      Yes                                 +---------+---------------+---------+-----------+----------+--------------+ FV Prox  Full           Yes      Yes                                 +---------+---------------+---------+-----------+----------+--------------+  FV Mid   Full           Yes      Yes                                 +---------+---------------+---------+-----------+----------+--------------+ FV DistalFull           Yes      Yes                                 +---------+---------------+---------+-----------+----------+--------------+ PFV      Full                                                        +---------+---------------+---------+-----------+----------+--------------+ POP      Full           Yes      Yes                                 +---------+---------------+---------+-----------+----------+--------------+ PTV                     Yes                                          +---------+---------------+---------+-----------+----------+--------------+ PERO                    Yes                                          +---------+---------------+---------+-----------+----------+--------------+ Gastroc  Full           Yes      Yes                                 +---------+---------------+---------+-----------+----------+--------------+  GSV      Full           Yes      Yes                                 +---------+---------------+---------+-----------+----------+--------------+   Right Technical Findings: Unable to compress calf veins due to increased girth and patient sensitivity. Color augmentation showed patent veins.  +---------+---------------+---------+-----------+----------+--------------+ LEFT     CompressibilityPhasicitySpontaneityPropertiesThrombus Aging +---------+---------------+---------+-----------+----------+--------------+ CFV      Full           Yes      Yes                                 +---------+---------------+---------+-----------+----------+--------------+ SFJ      Full           Yes      Yes                                 +---------+---------------+---------+-----------+----------+--------------+ FV Prox  Full           Yes      Yes                                  +---------+---------------+---------+-----------+----------+--------------+ FV Mid   Full           Yes      Yes                                 +---------+---------------+---------+-----------+----------+--------------+ FV DistalFull           Yes      Yes                                 +---------+---------------+---------+-----------+----------+--------------+ PFV      Full                                                        +---------+---------------+---------+-----------+----------+--------------+ POP      Full           Yes      Yes                                 +---------+---------------+---------+-----------+----------+--------------+ PTV                     Yes                                          +---------+---------------+---------+-----------+----------+--------------+ PERO                    Yes                                          +---------+---------------+---------+-----------+----------+--------------+  Gastroc  Full                                                        +---------+---------------+---------+-----------+----------+--------------+ GSV      Full           Yes      Yes                                 +---------+---------------+---------+-----------+----------+--------------+   Left Technical Findings: Unable to compress calf veins due to increased girth and patient sensitivity. Color augmentation showed patent veins.  Findings reported to Dr. Martinique 12 :15 pm and Dr. Julien Nordmann thru [place] email at 12:30 pm.  Summary: BILATERAL: - No evidence of deep vein thrombosis seen in the lower extremities, bilaterally. - RIGHT: - No cystic structure found in the popliteal fossa. - Largest groin lymph node measures 6.0 x 1.4 x 4.1 cm. Moderate superficial edema noted in calf. - Ultrasound characteristics of enlarged lymph nodes are noted in the groin.  LEFT: - No cystic structure found in the popliteal fossa. -  Largest groin lymph node measures 5.5 x 1.4 x 2.9 cm. Moderate superficial edema noted in calf. - Ultrasound characteristics of enlarged lymph nodes noted in the groin.  *See table(s) above for measurements and observations. Electronically signed by Monica Martinez MD on 07/08/2022 at 3:32:55 PM.    Final      ASSESSMENT/PLAN:  This is a very pleasant 72 year old Caucasian male with a stage IV non-small cell lung cancer, adenocarcinoma with positive EGFR mutation with deletion in exon 19 presented with cavitary left lower lobe lung mass in addition to other pulmonary nodules and scattered hypermetabolic osseous metastatic disease involving the spines, ribs and bony pelvis with left proximal humeral pathologic fracture. Diagnosed in September 2020.    He is status post palliative radiotherapy to the metastatic disease in the left arm under the care of Dr. Sondra Come. This was completed in October 2020.   The patient is currently undergoing treatment with target therapy with Tagrisso 80 mg p.o. daily status post 39 months of treatment.   he had palliative radiation to the right hip due to a new lesion under the care of Dr. Sondra Come and this was completed on 05/19/2022.    He is undergoing monthly Xgeva due to his metastatic bone lesions. His next dose is overdue. Last given in October 2023.  Unfortunately, the patient is currently bedridden and unable to make it into the clinic.  The patient had a repeat PET scan performed in January 2023 which showed disease progression.  The patient had repeat molecular studies that showed he developed a met amplification.  Therefore Dr. Julien Nordmann recommended in addition to his Tagrisso, taking Tepmetko ***milligrams daily.  Unfortunately, due to the pain patient's limitations with pain and mobility, he has not made it into the clinic to discuss and he was hesitant to start taking this medication without being seen.  Dr. Julien Nordmann also connected with the patient via telephone  encounter today.  It sounds like the patient's prognosis is very poor unless disease control is obtained.  Dr. Julien Nordmann again strongly consider the patient to consider adding Tepmetko on in addition to his [place].  The adverse side effects of treatment were discussed  including but not limited to ***.  The patient was agreeable to start treatment and we will arrange for the oral chemotherapy pharmacist to call him to discuss patient education.  He was instructed to take his first doses soon as possible.  Home health to do labs?  Really concerned about the patient's decreased mobility  Follow-up telephone visit  Continue to follow with palliative and cardiology, wound care, and lymphedema clinic  The patient was advised to call immediately if she has any concerning symptoms in the interval. The patient voices understanding of current disease status and treatment options and is in agreement with the current care plan. All questions were answered. The patient knows to call the clinic with any problems, questions or concerns. We can certainly see the patient much sooner if necessary         No orders of the defined types were placed in this encounter.    I spent {CHL ONC TIME VISIT - AJHHI:[id number]} counseling the patient face to face. The total time spent in the appointment was {CHL ONC TIME VISIT - OERQS:[id number]}.  Albert Mandato L Jaray Boliver, PA-C 08/02/22

## 2022-08-02 NOTE — Progress Notes (Unsigned)
Virtual Visit via Telephone Note   Because of Albert Perez's co-morbid illnesses, he is at least at moderate risk for complications without adequate follow up.  This format is felt to be most appropriate for this patient at this time.  The patient did not have access to video technology/had technical difficulties with video requiring transitioning to audio format only (telephone).  All issues noted in this document were discussed and addressed.  No physical exam could be performed with this format.  Please refer to the patient's chart for his consent to telehealth for [facility].    Date:  08/04/2022   ID:  Albert Perez, DOB [birth year], MRN [id number] The patient was identified using 2 identifiers.  Patient Location: Home Provider Location: Office/Clinic   PCP:  Curt Bears, [facility] Cardiologist:  Quay Burow, MD Evaluation Performed:  Follow-Up Visit  Chief Complaint:  Echo follow-up  History of Present Illness:    Albert Perez is a 72 y.o. male with hx of tobacco use, hyperlipidemia, lung cancer, CVA, sleep apnea intolerant to CPAP, paroxysmal atrial fibrillation 2000 - was thought due to stress, caffeine, and alcohol.  SVT: Found in the emergency room 06/04/2019 -- Due to the coronavirus pandemic, he was not ablated.  Echo 2019 with EF 55-60%.  I saw him on June 02, 2022.  Patient had noted increased LE edema after finishing radiation treatment for a right hip mass.  We repeated an echo and switched Lasix to torsemide.    He last was seen by Dr. Peter Martinique on July 08, 2022.  Patient had persistent swelling.  His diuretics have been titrated to torsemide 80 mg in the morning and 50 mg in the evening.  Dr. Martinique suspected this may be related to metastatic disease to lymph nodes.  Echo 07/2022: EF 50-55%. No wall motion abnormalities. Normal RV.  Mild mitral regurgitation. IVC is normal size.   Today, I spoke with wife's  patient over the phone.  Patient has stage IV metastatic non-small cell lung adenocarcinoma with scattered osseous disease involving ribs, pelvis, left humerus pathological fracture, and spine s/p palliative radiotherapy and Tagrisso treatments.   She states patient has home care.  He has had resolution of lower extremity edema and is now off diuretics.  Home care was helping with open sores on his leg and he has a follow-up with wound care on February 13.  Patient is waiting on hospital bed.  His wife states he is starting a new chemotherapy pill to take along with Targisso.  Patient saw palliative care medicine for pain management.  Wife states patient is unable to walk.  They are going to see orthopedics for possible cortisone injections.  They are also considering a second opinion for oncology options.  Patient is rarely smoking now.  His wife denies that patient has had shortness of breath, chest pain and palpitations.  She states his vitals have been stable.      Past Medical History:  Diagnosis Date   A-fib [facility])    PMH: 2000-2001   Arthritis    Cancer ([place])    melanoma 2018   Diarrhea    due to medications   DVT (deep venous thrombosis) (H[facility])    Left brachial vein DVT (01/30/19, following fracture)   Dysrhythmia    afib -    ETOH abuse    GERD (gastroesophageal reflux disease)    ocassional   History of radiation therapy  Right Hip 02/08/22-02/19/22-Dr. Gery Pray   History of radiation therapy    Right Pelvis-05/06/22-05/19/22- Dr. Gery Pray   Lung cancer [facility]) dx'd 02/26/2019   Metastatic cancer to bone ([place]) dx'd 02/26/2019   Sleep apnea    do not wear CPAP   Stroke ([place]) 2019   " mini"   Vertigo    Wears glasses    Past Surgical History:  Procedure Laterality Date   ANKLE FRACTURE SURGERY Right 2003   COLONOSCOPY  2018   ELBOW SURGERY Left 1964   INGUINAL HERNIA REPAIR Bilateral 11/12/2020   Procedure: BILATERAL OPEN INGUINAL HERNIA REPAIR WITH MESH;  Surgeon:  Coralie Keens, MD;  Location: [place];  Service: General;  Laterality: Bilateral;   melanoma removal  04/2017   Lt shoulder   RETINAL DETACHMENT SURGERY Right 1971   TONSILLECTOMY     VIDEO BRONCHOSCOPY WITH ENDOBRONCHIAL NAVIGATION N/A 03/28/2019   Procedure: VIDEO BRONCHOSCOPY WITH ENDOBRONCHIAL NAVIGATION and Biopsies;  Surgeon: Collene Gobble, MD;  Location: [facility] OR;  Service: Thoracic;  Laterality: N/A;     Current Meds  Medication Sig   buPROPion (WELLBUTRIN XL) 150 MG 24 hr tablet Take 150 mg by mouth daily as needed (nicotine cravings).   carvedilol (COREG) 6.25 MG tablet TAKE 1 TABLET BY MOUTH TWICE A DAY   Cholecalciferol (VITAMIN D-3) 25 MCG (1000 UT) CAPS Take 1,000 Units by mouth daily.   ELIQUIS 5 MG TABS tablet TAKE 1 TABLET BY MOUTH TWICE A DAY   furosemide (LASIX) 20 MG tablet Take 20 mg by mouth daily.   gabapentin (NEURONTIN) 300 MG capsule Take 1 capsule (300 mg total) by mouth 2 (two) times daily.   loperamide (IMODIUM) 2 MG capsule Take 2 mg by mouth as needed for diarrhea or loose stools.   Magnesium 500 MG CAPS Take 1,000 mg by mouth daily.   Multiple Vitamin (MULTIVITAMIN PO) Take 1 tablet by mouth daily. Centrum   osimertinib mesylate (TAGRISSO) 80 MG tablet Take 1 tablet (80 mg total) by mouth daily.   OVER THE COUNTER MEDICATION Take 1 tablet by mouth daily. Standard Process, LivaPlex (Liver supplement)   oxyCODONE ER (XTAMPZA ER) 18 MG C12A Take 1 capsule by mouth every 8 (eight) hours.   oxyCODONE-acetaminophen (PERCOCET) 7.5-325 MG tablet Take 2 tablets by mouth every 4 (four) hours as needed for severe pain.   potassium chloride (KLOR-CON) 10 MEQ tablet Take 1 tablet (10 mEq total) by mouth daily.   rosuvastatin (CRESTOR) 5 MG tablet Take 5 mg by mouth daily.   tepotinib hcl (TEPMETKO) 225 MG tablet Take 2 tablets (450 mg total) by mouth daily. Take with food.   torsemide (DEMADEX) 20 MG tablet Take 20 mg by mouth as needed (for swelling). Take 4 tablets (  80 mg ) in morning and take 2 tablets ( 40 mg ) in afternoon   Triamcinolone Acetonide (TRIAMCINOLONE 0.1 % CREAM : EUCERIN) CREA Apply 1 Application topically 2 (two) times daily.   TURMERIC PO Take 1 capsule by mouth daily.   vitamin B-12 (CYANOCOBALAMIN) 1000 MCG tablet Take 1,000 mcg by mouth daily.     Allergies:   Patient has no known allergies.   Social History   Tobacco Use   Smoking status: Every Day    Packs/day: 0.25    Years: 30.00    Total pack years: 7.50    Types: Cigarettes   Smokeless tobacco: Never   Tobacco comments:    04/27/21: Per pt, smokes 3 cigs  a day now.  Vaping Use   Vaping Use: Former  Substance Use Topics   Alcohol use: Yes    Alcohol/week: 2.0 standard drinks of alcohol    Types: 2 Standard drinks or equivalent per week   Drug use: Yes    Types: Marijuana    Comment: ocassionally- last time was 11/07/20     Family Hx: The patient's family history includes Aneurysm in his mother; Colon cancer in his brother; Prostate cancer in his brother.  ROS:   Please see the history of present illness.    All other systems reviewed and are negative.   Prior CV studies:   The following studies were reviewed today: Echo 07/2022  Labs/Other Tests and Data Reviewed:    Recent Labs: 07/20/2022: ALT 34; B Natriuretic Peptide 97.9; BUN 33; Creatinine, Ser 1.01; Hemoglobin 10.1; Platelets 294; Potassium 5.0; Sodium 135   Recent Lipid Panel No results found for: "CHOL", "TRIG", "HDL", "CHOLHDL", "LDLCALC", "LDLDIRECT"  Wt Readings from Last 3 Encounters:  08/04/22 164 lb (74.4 kg)  07/08/22 170 lb 6.4 oz (77.3 kg)  07/08/22 167 lb 4.8 oz (75.9 kg)     Objective:    Vital Signs:  BP 104/60   ASSESSMENT & PLAN:    Acute diastolic heart failure - resolved Lower extremity edema - resolved Echo 07/2022: EF 50-55%. No wall motion abnormalities. Normal RV. Mild mitral regurgitation. IVC is normal size. -Initially put on torsemide 20mg  daily in 05/2022,  titrated to torsemide 80 mg in the morning and 40 mg in the afternoon. -LE edema may be related to cancer, lymphadenopathy.  -Diuretics have been discontinued.  He can take torsemide 20 mg as needed for worsened leg swelling. -Continue salt restriction     Hx of paroxysmal atrial fibrillation and SVT, no palpitations -Continue carvedilol -Continue Eliquis -no bleeding issues   Elevated coronary artery calcium score -Coronary calcium score 640 in 2020 -Continue Crestor.   Tobacco use, seldom [place] now -Recommend tobacco cessation.    Dispo: He can follow-up with [place] as needed.  Due to stage IV metastatic cancer and immobility, it is difficult to get patient in-person appointments.  His wife will let us know of any cardiac symptoms.   Time:   Today, I have spent 10 minutes with the patient with telehealth technology discussing the above problems.     Medication Adjustments/Labs and Tests Ordered: Current medicines are reviewed at length with the patient today.  Concerns regarding medicines are outlined above.   Tests Ordered: No orders of the defined types were placed in this encounter.   Medication Changes: No orders of the defined types were placed in this encounter.   Follow Up:  As needed  Signed, Warren Lacy, PA-C  08/04/2022 1:12 PM    [place]

## 2022-08-02 NOTE — Telephone Encounter (Signed)
Wife is calling to see if appt on 1/30 can be virtual. Patient is currently bedridden. Please adise

## 2022-08-02 NOTE — Telephone Encounter (Signed)
LMTCB

## 2022-08-03 ENCOUNTER — Other Ambulatory Visit: Payer: Self-pay

## 2022-08-03 ENCOUNTER — Inpatient Hospital Stay (HOSPITAL_BASED_OUTPATIENT_CLINIC_OR_DEPARTMENT_OTHER): Payer: Medicare Other | Admitting: Physician Assistant

## 2022-08-03 ENCOUNTER — Telehealth: Payer: Self-pay

## 2022-08-03 ENCOUNTER — Other Ambulatory Visit (HOSPITAL_COMMUNITY): Payer: Self-pay

## 2022-08-03 DIAGNOSIS — Z7189 Other specified counseling: Secondary | ICD-10-CM | POA: Diagnosis not present

## 2022-08-03 DIAGNOSIS — C3490 Malignant neoplasm of unspecified part of unspecified bronchus or lung: Secondary | ICD-10-CM

## 2022-08-03 DIAGNOSIS — C3492 Malignant neoplasm of unspecified part of left bronchus or lung: Secondary | ICD-10-CM | POA: Diagnosis not present

## 2022-08-03 NOTE — Progress Notes (Signed)
This nurse reached out to [facility] and requested wound care services in the home.  Per their request a referral was entered and faxed to their office at [phone].  She states that she will reach out to the patient and family to setup.

## 2022-08-03 NOTE — Telephone Encounter (Signed)
This nurse received a call from [facility] home [facility] related to referral that was submitted for wound care services. This nurse explained that patient BLE are weeping and now have open sores.  He has a referral in for outpatient wound care clinic but they cannot get him in until late February.   She verified with this nurse that patient is aware that it is an out of pocket expense for the patient.  This nurse advised that patient and wife has been made aware of cost. No further questions or concerns noted at this time.

## 2022-08-03 NOTE — Telephone Encounter (Signed)
Called patient to to advise appointment for 08/04/22 changed to virtual appointment. Left detail message for patient

## 2022-08-03 NOTE — Telephone Encounter (Signed)
Oral Chemotherapy Pharmacist Encounter  Patient Education I spoke with patient and patient's for overview of new oral chemotherapy medication: Tepmetko (tepotinib) for the treatment of metastatic non-small cell lung cancer, MET amplification, in addition to continued Tagrisso therapy, planned duration until disease progression or unacceptable drug toxicity.  Pt is doing well. Counseled patient on administration, dosing, side effects, monitoring, drug-food interactions, safe handling, storage, and disposal.  Patient will take Tepmetko 225 mg tablets, 2 tablets (450 mg total) by mouth daily. Patient instructed to take with food.  Start date: 08/05/22  Side effects include but not limited to: edema, changes in LFTs, changes in electrolytes, decreased Hgb and WBC. Rare but serious side effect of pneumonitis/ILD discussed.   Reviewed with patient importance of keeping a medication schedule and plan for any missed doses.  After discussion with patient no patient barriers to medication adherence identified.   Mr. And Ms. Brett voiced understanding and appreciation. All questions answered. Medication handout provided.  Provided patient's wife with Oral Chemotherapy Navigation Clinic phone number. Patient and patient's wife know to call the office with questions or concerns.   Leron Croak, PharmD, BCPS, BCOP Hematology/Oncology Clinical Pharmacist [facility] and [place] [phone] 08/03/2022 1:34 PM

## 2022-08-04 ENCOUNTER — Encounter: Payer: Self-pay | Admitting: Physician Assistant

## 2022-08-04 ENCOUNTER — Encounter: Payer: Self-pay | Admitting: Internal Medicine

## 2022-08-04 ENCOUNTER — Ambulatory Visit: Payer: Medicare Other | Attending: Physician Assistant | Admitting: Physician Assistant

## 2022-08-04 ENCOUNTER — Telehealth: Payer: Self-pay

## 2022-08-04 VITALS — BP 104/60 | Ht 67.5 in | Wt 164.0 lb

## 2022-08-04 DIAGNOSIS — R931 Abnormal findings on diagnostic imaging of heart and coronary circulation: Secondary | ICD-10-CM

## 2022-08-04 DIAGNOSIS — I480 Paroxysmal atrial fibrillation: Secondary | ICD-10-CM | POA: Diagnosis not present

## 2022-08-04 DIAGNOSIS — R600 Localized edema: Secondary | ICD-10-CM | POA: Diagnosis not present

## 2022-08-04 NOTE — Telephone Encounter (Signed)
This nurse received a call from [facility] with [facility], she states that she went to patients home today and assessed his BLE.  She stated they are weeping and have small open areas but it was not very bad.    She states that patient does need a hospital bed due to him sleeping on a very low cot and would benefit for mobility.  This nurse advised that a DME order will be submitted for the patient to get a hospital bed and the provider will be made aware of the assessment.  No further questions or concerns noted at this time.

## 2022-08-04 NOTE — Patient Instructions (Signed)
Medication Instructions:  Torsemide 20 g ( Take 1 Tablet as needed for Swelling and weight gain of 3 lbs. Overnight and 5 lbs. In a week). *If you need a refill on your cardiac medications before your next appointment, please call your pharmacy*   Lab Work: No Labs If you have labs (blood work) drawn today and your tests are completely normal, you will receive your results only by: [place] (if you have MyChart) OR A paper copy in the mail If you have any lab test that is abnormal or we need to change your treatment, we will call you to review the results.   Testing/Procedures: No Testing    Follow-Up: At [facility], you and your health needs are our priority.  As part of our continuing mission to provide you with exceptional heart care, we have created designated Provider Care Teams.  These Care Teams include your primary Cardiologist (physician) and Advanced Practice Providers (APPs -  Physician Assistants and Nurse Practitioners) who all work together to provide you with the care you need, when you need it.  We recommend signing up for the patient portal called "MyChart".  Sign up information is provided on this After Visit Summary.  MyChart is used to connect with patients for Virtual Visits (Telemedicine).  Patients are able to view lab/test results, encounter notes, upcoming appointments, etc.  Non-urgent messages can be sent to your provider as well.   To learn more about what you can do with MyChart, go to [email or web address].    Your next appointment:   As Needed  Provider:   Quay Burow, MD

## 2022-08-05 ENCOUNTER — Other Ambulatory Visit: Payer: Self-pay

## 2022-08-05 ENCOUNTER — Other Ambulatory Visit (HOSPITAL_COMMUNITY): Payer: Self-pay

## 2022-08-06 ENCOUNTER — Other Ambulatory Visit: Payer: Self-pay

## 2022-08-06 ENCOUNTER — Other Ambulatory Visit (HOSPITAL_COMMUNITY): Payer: Self-pay

## 2022-08-09 ENCOUNTER — Telehealth: Payer: Self-pay | Admitting: Pharmacist

## 2022-08-09 ENCOUNTER — Inpatient Hospital Stay: Payer: Medicare Other | Attending: Internal Medicine | Admitting: Nurse Practitioner

## 2022-08-09 ENCOUNTER — Other Ambulatory Visit: Payer: Self-pay

## 2022-08-09 ENCOUNTER — Other Ambulatory Visit (HOSPITAL_COMMUNITY): Payer: Self-pay

## 2022-08-09 DIAGNOSIS — C7951 Secondary malignant neoplasm of bone: Secondary | ICD-10-CM | POA: Diagnosis not present

## 2022-08-09 DIAGNOSIS — C3492 Malignant neoplasm of unspecified part of left bronchus or lung: Secondary | ICD-10-CM

## 2022-08-09 DIAGNOSIS — C3490 Malignant neoplasm of unspecified part of unspecified bronchus or lung: Secondary | ICD-10-CM | POA: Diagnosis not present

## 2022-08-09 DIAGNOSIS — Z515 Encounter for palliative care: Secondary | ICD-10-CM | POA: Diagnosis not present

## 2022-08-09 DIAGNOSIS — R530 Neoplastic (malignant) related fatigue: Secondary | ICD-10-CM | POA: Diagnosis not present

## 2022-08-09 DIAGNOSIS — G893 Neoplasm related pain (acute) (chronic): Secondary | ICD-10-CM | POA: Diagnosis not present

## 2022-08-09 MED ORDER — TEPOTINIB HCL 225 MG PO TABS: 450.0000 mg | ORAL_TABLET | Freq: Every day | ORAL | 3 refills | Status: DC

## 2022-08-09 MED ORDER — OXYCODONE-ACETAMINOPHEN 7.5-325 MG PO TABS
1.0000 | ORAL_TABLET | Freq: Four times a day (QID) | ORAL | 0 refills | Status: DC | PRN
  Filled 2022-08-09 – 2022-08-10 (×3): qty 90, 12d supply, fill #0

## 2022-08-09 NOTE — Telephone Encounter (Signed)
Oral Oncology Pharmacist Encounter  Notified by [place] that they have tried to get Tepmetko from [employer]/manufacturer but have yet to still receive medication and have been provided with no update on when or if the medication may come.  Prescription redirected to [facility] by [facility] for dispensing as they have access to this medication. Request made to urgently expedite prescription. Patient's wife updated and appreciative up update.   Tentative start date of Tepmetko at this time is ~08/11/22.  Leron Croak, PharmD, BCPS, BCOP Hematology/Oncology Clinical Pharmacist [facility] and [place] [phone] 08/09/2022 12:12 PM

## 2022-08-10 ENCOUNTER — Encounter: Payer: Self-pay | Admitting: Internal Medicine

## 2022-08-10 ENCOUNTER — Other Ambulatory Visit (HOSPITAL_COMMUNITY): Payer: Self-pay

## 2022-08-10 NOTE — Progress Notes (Signed)
[facility]  [phone] [phone]   Name: Albert Perez Date: 08/10/2022 MRN: [id number]  DOB: 03/24/1951  Patient Care Team: Curt Bears, MD as PCP - General (Oncology) Constance Haw, MD as PCP - Electrophysiology (Cardiology) Lorretta Harp, MD as PCP - Cardiology (Cardiology) Gentry Fitz, MD as Consulting Physician (Orthopedic Surgery) Grainger, [name], NP as Nurse Practitioner (Nurse Practitioner)    I connected with Albert Perez on 08/09/22 at  3:00 PM EST by phone and verified that I am speaking with the correct person using two identifiers.   I discussed the limitations, risks, security and privacy concerns of performing an evaluation and management service by telemedicine and the availability of in-person appointments. I also discussed with the patient that there may be a patient responsible charge related to this service. The patient expressed understanding and agreed to proceed.   Other persons participating in the visit and their role in the encounter: Albert Perez, wife and Maygan, RN   Patient's location: home  Provider's location: [facility]   Chief Complaint: follow up of symptom management   INTERVAL HISTORY: Albert Perez is a 72 y.o. 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 Recent scans  Palliative ask to see for.  Palliative ask to see for symptom management.  SOCIAL HISTORY:     reports that he has been smoking cigarettes. He has a 7.50 pack-year smoking history. He has never used smokeless tobacco. He reports current alcohol use of about 2.0 standard drinks of alcohol per week. He reports current drug use. Drug: Marijuana.  ADVANCE DIRECTIVES:     CODE STATUS:   PAST MEDICAL HISTORY: Past Medical History:  Diagnosis Date   A-fib [facility])    PMH: 2000-2001   Arthritis    Cancer ([place])    melanoma 2018   Diarrhea    due to medications   DVT (deep venous thrombosis) ([facility])    Left brachial vein DVT (01/30/19, following fracture)   Dysrhythmia    afib -    ETOH abuse    GERD (gastroesophageal reflux disease)    ocassional   History of radiation therapy    Right Hip 02/08/22-02/19/22-Dr. Gery Pray   History of radiation therapy    Right Pelvis-05/06/22-05/19/22- Dr. Gery Pray   Lung cancer [facility]) dx'd 02/26/2019   Metastatic cancer to bone ([place]) dx'd 02/26/2019   Sleep apnea    do not wear CPAP   Stroke ([place]) 2019   " mini"   Vertigo    Wears glasses     ALLERGIES:  has No Known Allergies.  MEDICATIONS:  Current Outpatient Medications  Medication Sig Dispense Refill   buPROPion (WELLBUTRIN XL) 150 MG 24 hr tablet Take 150 mg by mouth daily as needed (nicotine cravings).     carvedilol (COREG) 6.25 MG tablet TAKE 1 TABLET BY MOUTH TWICE A DAY 180 tablet 3   Cholecalciferol (VITAMIN D-3) 25 MCG (1000 UT) CAPS Take 1,000 Units by mouth daily.     ELIQUIS 5 MG TABS tablet TAKE 1 TABLET BY MOUTH TWICE A DAY 60 tablet 2   furosemide (LASIX) 20 MG tablet Take 20 mg by mouth daily.     gabapentin (NEURONTIN) 300 MG capsule Take 1 capsule (300 mg total) by mouth 2 (two) times daily.  60 capsule 0   loperamide (IMODIUM) 2 MG capsule Take 2 mg by mouth as needed for diarrhea or loose stools.     Magnesium 500 MG CAPS Take 1,000 mg by mouth daily.     Multiple Vitamin (MULTIVITAMIN PO) Take 1 tablet by mouth daily. Centrum     osimertinib mesylate (TAGRISSO) 80 MG tablet Take 1 tablet (80 mg total) by mouth daily. 60 tablet 3   OVER THE COUNTER MEDICATION Take 1 tablet by mouth daily. Standard Process, LivaPlex (Liver supplement)     oxyCODONE ER (XTAMPZA ER) 18 MG C12A Take 1 capsule by mouth every 8 (eight) hours. 60  capsule 0   oxyCODONE-acetaminophen (PERCOCET) 7.5-325 MG tablet Take 1-2 tablets by mouth every 6 (six) hours as needed for severe pain. 90 tablet 0   potassium chloride (KLOR-CON) 10 MEQ tablet Take 1 tablet (10 mEq total) by mouth daily. 90 tablet 1   rosuvastatin (CRESTOR) 5 MG tablet Take 5 mg by mouth daily.  2   tepotinib hcl (TEPMETKO) 225 MG tablet Take 2 tablets (450 mg total) by mouth daily. Take with food. 60 tablet 3   torsemide (DEMADEX) 20 MG tablet Take 20 mg by mouth as needed (for swelling). Take 4 tablets ( 80 mg ) in morning and take 2 tablets ( 40 mg ) in afternoon 180 tablet 3   Triamcinolone Acetonide (TRIAMCINOLONE 0.1 % CREAM : EUCERIN) CREA Apply 1 Application topically 2 (two) times daily. 1 each 0   TURMERIC PO Take 1 capsule by mouth daily.     vitamin B-12 (CYANOCOBALAMIN) 1000 MCG tablet Take 1,000 mcg by mouth daily.     No current facility-administered medications for this visit.    VITAL SIGNS: There were no vitals taken for this visit. There were no vitals filed for this visit.   Estimated body mass index is 25.31 kg/m as calculated from the following:   Height as of 08/04/22: 5' 7.5" (1.715 m).   Weight as of 08/04/22: 164 lb (74.4 kg).   PERFORMANCE STATUS (ECOG) : 2 - Symptomatic, <50% confined to bed  IMPRESSION: I was able to connect with Albert Perez by phone. No acute distress identified. Patient continues to feel better with each day. Wife confirms patient is now spending time in a hospital bed with air mattress overlay. His [place] brothers have built him a ramp on their home for accessible wheelchair use.   Denies any signs of confusion or hallucinations. Denies nausea, vomiting, constipation, or diarrhea. Wife shares they have a wound care nurse coming out to the home to wrap legs and care for open wound. He also has sacral pressure wound which wife reports is now healing. They are turning him more frequently. Confirms he has an appointment next week  at wound care center.   Patient is requesting refill on oxycodone. I further clarify with wife on patient's use of pain medication. She confirms he is not having occasional pain in hip, groin, and sacral area. Also some lower extremity discomfort at times. Shares family has purchased Manuka honey and is also using on areas.   Albert Perez has been weaned off of Xtampza however continues to tolerate oxycodone as needed. Does not require around the clock. Wife reports he will take every 6-8hrs. He is taking 1-2 tablets depending on pain severity. Education provided on safe use.   Patient is unable to come into office due to limited mobility at this time. They have agreed to maintain virtual contact until  this improves. They are not interested in physical therapy as offered. Albert Perez is hopeful once ramp is placed this will make it easier to get out of the home in addition to hopes he will continue to improve.   PLAN: Oxycodone 7.5 mg every 6-8 hours as needed for breakthrough pain Discontinue Xtampza 18 mg.  Miralax daily for bowel regimen  We will continue to closely monitor and support as needed. I will plan to see patient back in 3-4 weeks in collaboration with his other oncology/radiation appointments. Patient and wife knows to contact our office sooner if needed.   Albert Perez expressed understanding and was in agreement with this plan. He also understands that He can call the clinic at any time with any questions, concerns, or complaints.    Any controlled substances utilized were prescribed in the context of palliative care. PDMP has been reviewed.    Time Total: 45 min   Visit consisted of counseling and education dealing with the complex and emotionally intense issues of symptom management and palliative care in the setting of serious and potentially life-threatening illness.Greater than 50%  of this time was spent counseling and coordinating care related to the above assessment and  plan.  Alda Lea, AGPCNP-BC  Palliative Medicine Team/[place]

## 2022-08-11 ENCOUNTER — Other Ambulatory Visit (HOSPITAL_COMMUNITY): Payer: Self-pay

## 2022-08-11 ENCOUNTER — Telehealth: Payer: Self-pay | Admitting: Internal Medicine

## 2022-08-11 ENCOUNTER — Other Ambulatory Visit: Payer: Self-pay

## 2022-08-11 NOTE — Telephone Encounter (Signed)
Scheduled per 01/30 los, patient has been called and notified.

## 2022-08-13 ENCOUNTER — Telehealth: Payer: Medicare Other

## 2022-08-16 ENCOUNTER — Telehealth: Payer: Self-pay

## 2022-08-16 NOTE — Telephone Encounter (Signed)
Patient's spouse Blanch Media called asking if there was anyway someone could come out and do his labs instead of them coming in for his appointments. This LPN asked Cassandra H. PA if that is something we offer and she states no we do not offer that but they could ask their home health agency if that is something they offer. Blanch Media states it's just hard to get him up since he is in so much pain and she can't do it by herself. This LPN asked if it was possible for the home health agency to assist her in getting him to his appointments. Blanch Media said she will contact them and ask and reach back out.

## 2022-08-17 ENCOUNTER — Ambulatory Visit (HOSPITAL_BASED_OUTPATIENT_CLINIC_OR_DEPARTMENT_OTHER): Payer: Medicare Other | Admitting: General Surgery

## 2022-08-18 ENCOUNTER — Other Ambulatory Visit (HOSPITAL_COMMUNITY): Payer: Self-pay

## 2022-08-18 ENCOUNTER — Telehealth: Payer: Self-pay | Admitting: Medical Oncology

## 2022-08-18 NOTE — Telephone Encounter (Signed)
  Decubitus Called HHRN and left message to contact PCP re: decubitus.  Decubitus right buttock. [facility] nurse concerned. She will do a further assessment today. Wife apply honey based salve on it.   Appt conflict 20/23-[facility] at 8616 for injection.  Cancel  oncology appt on 02/20-Will r/s .  Requests video visit.  Labs - ok for [facility] to draw them at home?   Legs-" Alie's legs are back to normal ".

## 2022-08-20 ENCOUNTER — Telehealth: Payer: Self-pay | Admitting: Medical Oncology

## 2022-08-20 NOTE — Telephone Encounter (Signed)
  Tepotinib not started-Albert Perez said he read about side effects and is "scared to take the drug". He said Dr Julien Nordmann told him there may be another treatment he can take.  Next appt  Albert Perez cannot come to cancer center to see [facility] or palliative care on 02/20 .   He has a ortho appt at 1100 . Blanch Media said it takes him 2 hours to get ready for the appt. -Wife notified we will r/s schedule a video visit .  Lab order faxed to [facility] for [facility] nurse to draw pt labs next week and fax results or call criticals.

## 2022-08-23 ENCOUNTER — Telehealth: Payer: Self-pay | Admitting: Physician Assistant

## 2022-08-23 ENCOUNTER — Telehealth: Payer: Self-pay | Admitting: Pharmacy Technician

## 2022-08-23 ENCOUNTER — Other Ambulatory Visit (HOSPITAL_COMMUNITY): Payer: Self-pay

## 2022-08-23 DIAGNOSIS — C7951 Secondary malignant neoplasm of bone: Secondary | ICD-10-CM | POA: Diagnosis not present

## 2022-08-23 DIAGNOSIS — M7061 Trochanteric bursitis, right hip: Secondary | ICD-10-CM | POA: Diagnosis not present

## 2022-08-23 DIAGNOSIS — C3490 Malignant neoplasm of unspecified part of unspecified bronchus or lung: Secondary | ICD-10-CM | POA: Diagnosis not present

## 2022-08-23 NOTE — Telephone Encounter (Signed)
Oral Oncology Patient Advocate Encounter  After completing a benefits investigation, prior authorization for Albert Perez is not required at this time through [facility].  Patient's copay is $0.     Albert Perez, CPhT-Adv Oncology Pharmacy Patient [place] Direct Number: [phone]  Fax: [phone]

## 2022-08-23 NOTE — Telephone Encounter (Signed)
I called the patient's wife to follow up.  He last had a telephone visit with the patient and his wife on 08/03/2022.  Unfortunately, the patient had significant evidence of disease progression at that time, particularly in the pelvic area which is affecting his mobility.  Therefore, we recommended the additional treatment with Tepmetko. The patient received this medication in the mail but was too afraid to start it after reading the side effect profile and never started taking it.  Therefore, I called the patient to discuss the options again.  The patient was sleeping because he saw his orthopedic provider today who arranged for 2 steroid joint injections.  Therefore the patient is exhausted.  I rediscussed the options with the patient's wife.  Unfortunately, if he does not start treatment soon, patient's prognosis is poor.  Discussed with the patient's wife it is always an option to pursue palliative care and hospice.  However, if he is interested in treatment, we discussed the options include trying Tepmetko, of course, if the patient had intolerance or adverse side effects, we would help manage these or stop treatment if unacceptable toxicity.  Also, discussed treatment with Xalkori, however, this also may have similar side effect profile to Tepmetko.  Reassured the patient's wife that should he start any of these targeted treatment options that we will be monitoring him closely.  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  The patient's wife states that she will review these with him and I will call them back tomorrow to help facilitate ever he decides.  However, I once again let them know the urgent nature of his condition and that we would support him with what ever he decides but he needs to make a decision soon to prevent further progression in his disease.  I will call the patient back  tomorrow between 3 and 4 PM.

## 2022-08-24 ENCOUNTER — Inpatient Hospital Stay: Payer: Medicare Other

## 2022-08-24 ENCOUNTER — Inpatient Hospital Stay (HOSPITAL_BASED_OUTPATIENT_CLINIC_OR_DEPARTMENT_OTHER): Payer: Medicare Other | Admitting: Physician Assistant

## 2022-08-24 ENCOUNTER — Inpatient Hospital Stay: Payer: Medicare Other | Admitting: Physician Assistant

## 2022-08-24 DIAGNOSIS — C3492 Malignant neoplasm of unspecified part of left bronchus or lung: Secondary | ICD-10-CM

## 2022-08-24 NOTE — Progress Notes (Signed)
I called the patient and his wife to follow-up on his decision about pursuing therapy.    In summary, the patient has stage IV non-small cell lung cancer, adenocarcinoma.  He had been undergoing targeted treatment with Tagrisso 80 mg p.o. daily for the last several years.  The patient has been having increased pain and decreased mobility over the last few months.  He had a restaging PET scan on 07/19/2022 that showed nterval progression of hypermetabolic bony metastases in the lower thoracic spine, right pelvis, and left sacrum.  Therefore, the patient had repeat molecular studies at that time that showed development of met amplification.  Dr. Julien Nordmann had recommended starting treatment with Tepmetko in conjunction with Tagrisso.  The patient received this medication earlier this month but was hesitant to start it after reading the side effect profile.    The patient continues to have significant mobility issues secondary to the metastatic disease in the pelvis.  It has been challenging to get him in and out of the house to doctors appointments.  Of note the patient did get a steroid injection in his hip yesterday which has helped his pain. He is also followed closely by palliative care.   I rediscussed the options with the patient.  We also discussed possible adverse side effects of different treatment options. We discussed the option of Tepmetko.  I let the patient know that should he develop any toxicities or intolerance to therapy, it is always an option to modify the dose or discontinue treatment.  We also discussed other targeted treatment options with crizotinib, although reviewed that the side effect profile is similar.  If the patient is concerned with taking met amplification therapies, the other therapy options include IV chemotherapy.  I discussed that IV chemotherapy options also have possible side effects such as fatigue, nausea, vomiting, diarrhea, constipation, myelosuppression, kidney, and  liver dysfunction, etc.  If the patient pursued IV chemotherapy, given his adenocarcinoma, it likely would include carboplatin and Alimta.  However, the patient's IV chemotherapy would need to be administered in the clinic.  I discussed with the patient his wife it is always an option to not pursue any treatment and pursue palliative care and hospice.  However, I did reassure the patient and his wife, if he was interested in treatment, that there are options for him.  His wife was also asking about clinical trials.  Discussed with the patient his wife if they are interested in a second opinion or clinical trials we are always happy to facilitate the referral.  I did reassure them that these are not experimental drugs and are standard therapies for his condition.  After a lengthy discussion, the patient decided to try Tepmetko and he will start taking his first dose tonight.  We will tentatively plan on arranging for a follow-up telephone visit in 2 weeks.  Since the patient is having a challenging time with mobility recently (likely secondary to his metastatic lesions) that we would recommend having his home health agency ([facility]) arrange for lab work a day or 2 prior to his appointment.  Hopeful that at some point, the patient's mobility will improve and we will able to see him back in the clinic.   The patient and his wife are in agreement with this plan.  Of course should the patient develop any concerning adverse side effects in the interval he was encouraged to call us sooner.

## 2022-08-25 ENCOUNTER — Telehealth: Payer: Self-pay | Admitting: Internal Medicine

## 2022-08-25 NOTE — Telephone Encounter (Signed)
Scheduled per 02/19 lod. Patient has been called and notified.

## 2022-08-27 ENCOUNTER — Other Ambulatory Visit: Payer: Self-pay | Admitting: Physician Assistant

## 2022-08-27 ENCOUNTER — Other Ambulatory Visit: Payer: Self-pay

## 2022-08-27 DIAGNOSIS — Z515 Encounter for palliative care: Secondary | ICD-10-CM

## 2022-08-27 DIAGNOSIS — G893 Neoplasm related pain (acute) (chronic): Secondary | ICD-10-CM

## 2022-08-27 DIAGNOSIS — C3490 Malignant neoplasm of unspecified part of unspecified bronchus or lung: Secondary | ICD-10-CM

## 2022-08-27 MED ORDER — OXYCODONE-ACETAMINOPHEN 7.5-325 MG PO TABS: 1.0000 | ORAL_TABLET | Freq: Four times a day (QID) | ORAL | 0 refills | Status: DC | PRN

## 2022-08-27 NOTE — Telephone Encounter (Signed)
Pt wife called stating that he is doing well but needs a refill of pain mediation. Per wife pt is taking about 3 pills a day and none overnight and his pain is well managed on that regimen.

## 2022-09-06 NOTE — Progress Notes (Unsigned)
[facility] OFFICE PROGRESS NOTE  Default, Provider, MD No address on file  DIAGNOSIS: Stage IV (T3, N0, M1c) non-small cell lung cancer, adenocarcinoma presented with cavitary left lower lobe lung mass in addition to other pulmonary nodules and scattered hypermetabolic osseous metastatic disease involving the spine, ribs and bony pelvis as well as left proximal humeral pathologic fracture.    Molecular Biomarkers performed by [employer] [employer] DETECTED ALTERATION(S) / Mayer [id number]  Afatinib, Dacomitinib, Erlotinib, Gefitinib, Osimertinib, Ramucirumab Neratinib   FGFR1Amplification  Erdafitinib, Lenvatinib, Nintedanib, Pazopanib, Pemigatinib, Ponatinib   [id number] None   Repeated Moleculars by [employer] [facility]: Positive for MET Amplification    PRIOR THERAPY:  1) Palliative radiotherapy to the the left humerus under the care of Dr. Sondra Come. Last treatment ~04/24/2019.  2) Palliative radiation to the Right Pelvis/Ilium under the care of Dr. Sondra Come. Last dose 05/19/22.   CURRENT THERAPY: 1) Targeted therapy with Tagrisso 80 p.o. daily.  He started the first dose on April 21, 2019. Status post 39 months of treatment. Tepmetko 450 mg p.o. daily starting on 08/24/22.  2) Xgeva every 4 weeks, last dose 04/27/22  INTERVAL HISTORY: Albert Perez 71 y.o. male and I connected via telephone call today. In summary, the patient has stage IV non-small cell lung cancer, adenocarcinoma.  He had been undergoing targeted treatment with Tagrisso 80 mg p.o. daily for the last several years.  The patient has been having increased pain and decreased mobility over the last few months.  He had a restaging PET scan on 07/19/2022 that showed nterval progression of hypermetabolic bony metastases in the lower thoracic spine, right pelvis, and left sacrum.  Therefore, the patient had repeat molecular studies at that time that showed development of met amplification.  Dr. Julien Nordmann  had recommended starting treatment with Tepmetko in conjunction with Tagrisso.  The patient was hesitant to start this after reading the side effect profile and delayed starting this for several weeks. The patient eventually started treatment on 08/24/22. Thus far, he has ***any adverse side effects.   Overall, he has been having issues with mobility due to metastatic bone lesions. He has seen orthopedics and received steroid injection in the *** which has helped his pain. He also follows with palliative care. He has a home health agency coming to the house. Percocet  Also in the interval since last being seen, he called [facility] to see if he is eligible for clinical trials.   Swelling *** In addition to pelvic pain, the patient also has pain near his left shoulder blade and down his left arm.  Of note the patient had palliative radiation to the left humerus metastatic lesion in 2020. Wound care. Fever, chills, or night sweats. Weight loss.   Otherwise, he denies any fever, chills, or night sweats.  He denies any respiratory complaints and denies any shortness of breath, cough, chest pain, or hemoptysis.  Denies any nausea or vomiting.  He has intermittent diarrhea and constipation.  Denies any headaches.   MEDICAL HISTORY: Past Medical History:  Diagnosis Date   A-fib [facility])    PMH: 2000-2001   Arthritis    Cancer ([place])    melanoma 2018   Diarrhea    due to medications   DVT (deep venous thrombosis) (HCC)    Left brachial vein DVT (01/30/19, following fracture)   Dysrhythmia    afib -    ETOH abuse    GERD (gastroesophageal reflux disease)  ocassional   History of radiation therapy    Right Hip 02/08/22-02/19/22-Dr. Gery Pray   History of radiation therapy    Right Pelvis-05/06/22-05/19/22- Dr. Gery Pray   Lung cancer [facility]) dx'd 02/26/2019   Metastatic cancer to bone [facility]) dx'd 02/26/2019   Sleep apnea    do not wear CPAP   Stroke ([place]) 2019   " mini"   Vertigo     Wears glasses     ALLERGIES:  has No Known Allergies.  MEDICATIONS:  Current Outpatient Medications  Medication Sig Dispense Refill   buPROPion (WELLBUTRIN XL) 150 MG 24 hr tablet Take 150 mg by mouth daily as needed (nicotine cravings).     carvedilol (COREG) 6.25 MG tablet TAKE 1 TABLET BY MOUTH TWICE A DAY 180 tablet 3   Cholecalciferol (VITAMIN D-3) 25 MCG (1000 UT) CAPS Take 1,000 Units by mouth daily.     ELIQUIS 5 MG TABS tablet TAKE 1 TABLET BY MOUTH TWICE A DAY 60 tablet 2   furosemide (LASIX) 20 MG tablet Take 20 mg by mouth daily.     gabapentin (NEURONTIN) 300 MG capsule Take 1 capsule (300 mg total) by mouth 2 (two) times daily. 60 capsule 0   loperamide (IMODIUM) 2 MG capsule Take 2 mg by mouth as needed for diarrhea or loose stools.     Magnesium 500 MG CAPS Take 1,000 mg by mouth daily.     Multiple Vitamin (MULTIVITAMIN PO) Take 1 tablet by mouth daily. Centrum     osimertinib mesylate (TAGRISSO) 80 MG tablet Take 1 tablet (80 mg total) by mouth daily. 60 tablet 3   OVER THE COUNTER MEDICATION Take 1 tablet by mouth daily. Standard Process, LivaPlex (Liver supplement)     oxyCODONE ER (XTAMPZA ER) 18 MG C12A Take 1 capsule by mouth every 8 (eight) hours. 60 capsule 0   oxyCODONE-acetaminophen (PERCOCET) 7.5-325 MG tablet Take 1-2 tablets by mouth every 6 (six) hours as needed for severe pain. 90 tablet 0   potassium chloride (KLOR-CON) 10 MEQ tablet Take 1 tablet (10 mEq total) by mouth daily. 90 tablet 1   rosuvastatin (CRESTOR) 5 MG tablet Take 5 mg by mouth daily.  2   tepotinib hcl (TEPMETKO) 225 MG tablet Take 2 tablets (450 mg total) by mouth daily. Take with food. 60 tablet 3   torsemide (DEMADEX) 20 MG tablet TAKE 1 TABLET BY MOUTH TWICE A DAY 180 tablet 1   Triamcinolone Acetonide (TRIAMCINOLONE 0.1 % CREAM : EUCERIN) CREA Apply 1 Application topically 2 (two) times daily. 1 each 0   TURMERIC PO Take 1 capsule by mouth daily.     vitamin B-12 (CYANOCOBALAMIN)  1000 MCG tablet Take 1,000 mcg by mouth daily.     No current facility-administered medications for this visit.    SURGICAL HISTORY:  Past Surgical History:  Procedure Laterality Date   ANKLE FRACTURE SURGERY Right 2003   COLONOSCOPY  2018   ELBOW SURGERY Left 1964   INGUINAL HERNIA REPAIR Bilateral 11/12/2020   Procedure: BILATERAL OPEN INGUINAL HERNIA REPAIR WITH MESH;  Surgeon: Coralie Keens, MD;  Location: [place];  Service: General;  Laterality: Bilateral;   melanoma removal  04/2017   Lt shoulder   RETINAL DETACHMENT SURGERY Right 1971   TONSILLECTOMY     VIDEO BRONCHOSCOPY WITH ENDOBRONCHIAL NAVIGATION N/A 03/28/2019   Procedure: VIDEO BRONCHOSCOPY WITH ENDOBRONCHIAL NAVIGATION and Biopsies;  Surgeon: Collene Gobble, MD;  Location: [place];  Service: Thoracic;  Laterality: N/A;  REVIEW OF SYSTEMS:   Review of Systems  Constitutional: Negative for appetite change, chills, fatigue, fever and unexpected weight change.  HENT:   Negative for mouth sores, nosebleeds, sore throat and trouble swallowing.   Eyes: Negative for eye problems and icterus.  Respiratory: Negative for cough, hemoptysis, shortness of breath and wheezing.   Cardiovascular: Negative for chest pain and leg swelling.  Gastrointestinal: Negative for abdominal pain, constipation, diarrhea, nausea and vomiting.  Genitourinary: Negative for bladder incontinence, difficulty urinating, dysuria, frequency and hematuria.   Musculoskeletal: Negative for back pain, gait problem, neck pain and neck stiffness.  Skin: Negative for itching and rash.  Neurological: Negative for dizziness, extremity weakness, gait problem, headaches, light-headedness and seizures.  Hematological: Negative for adenopathy. Does not bruise/bleed easily.  Psychiatric/Behavioral: Negative for confusion, depression and sleep disturbance. The patient is not nervous/anxious.     PHYSICAL EXAMINATION:  There were no vitals taken for this  visit.  ECOG PERFORMANCE STATUS: {CHL ONC ECOG [id number]  Physical Exam  Constitutional: Oriented to person, place, and time and well-developed, well-nourished, and in no distress. No distress.  HENT:  Head: Normocephalic and atraumatic.  Mouth/Throat: Oropharynx is clear and moist. No oropharyngeal exudate.  Eyes: Conjunctivae are normal. Right eye exhibits no discharge. Left eye exhibits no discharge. No scleral icterus.  Neck: Normal range of motion. Neck supple.  Cardiovascular: Normal rate, regular rhythm, normal heart sounds and intact distal pulses.   Pulmonary/Chest: Effort normal and breath sounds normal. No respiratory distress. No wheezes. No rales.  Abdominal: Soft. Bowel sounds are normal. Exhibits no distension and no mass. There is no tenderness.  Musculoskeletal: Normal range of motion. Exhibits no edema.  Lymphadenopathy:    No cervical adenopathy.  Neurological: Alert and oriented to person, place, and time. Exhibits normal muscle tone. Gait normal. Coordination normal.  Skin: Skin is warm and dry. No rash noted. Not diaphoretic. No erythema. No pallor.  Psychiatric: Mood, memory and judgment normal.  Vitals reviewed.  LABORATORY DATA: Lab Results  Component Value Date   WBC 9.9 07/20/2022   HGB 10.1 (L) 07/20/2022   HCT 31.8 (L) 07/20/2022   MCV 91.6 07/20/2022   PLT 294 07/20/2022      Chemistry      Component Value Date/Time   NA 135 07/20/2022 1415   NA 138 06/10/2022 1006   K 5.0 07/20/2022 1415   CL 101 07/20/2022 1415   CO2 26 07/20/2022 1415   BUN 33 (H) 07/20/2022 1415   BUN 17 06/10/2022 1006   CREATININE 1.01 07/20/2022 1415   CREATININE 1.32 (H) 06/23/2022 1439      Component Value Date/Time   CALCIUM 9.1 07/20/2022 1415   ALKPHOS 92 07/20/2022 1415   AST 27 07/20/2022 1415   AST 20 06/23/2022 1439   ALT 34 07/20/2022 1415   ALT 23 06/23/2022 1439   BILITOT 0.4 07/20/2022 1415   BILITOT 0.5 06/23/2022 1439       RADIOGRAPHIC  STUDIES:  No results found.   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 Diagnosed in September 2020.    He is status post palliative radiotherapy to the metastatic disease in the left arm  under the care of Dr. Sondra Come. This was completed in October 2020.   The patient is currently undergoing treatment with target therapy with Tagrisso 80 mg p.o. daily status post *** months of treatment.   he had palliative radiation to the right hip due to a new lesion under the care of Dr. Sondra Come and this was completed on 05/19/2022.     He is undergoing monthly Xgeva due to his metastatic bone lesions. His next dose is overdue. Last given in October 2023.  Unfortunately, the patient is currently bedridden and unable to make it into the clinic.  The patient had a repeat PET scan performed in January 2023 which showed disease progression.  The patient had repeat molecular studies that showed he developed a met amplification.  Therefore Dr. Julien Nordmann recommended in addition to his Tagrisso, taking Tepmetko 450 milligrams daily.  The patient was hesitant to start this due to concerns about the side effect profile. He eventually started this on 08/24/22. He has been tolerating ***.   The patient was seen with Dr. Julien Nordmann. Recommend ***  F/U?  Home health.              No orders of the defined types were placed in this encounter.    I spent {CHL ONC TIME VISIT - [id number] counseling the patient face to face. The total time spent in the appointment was {CHL ONC TIME VISIT - [id number].  Jozlyn Schatz L Shanquita Ronning,  PA-C 09/06/22

## 2022-09-07 DIAGNOSIS — C7951 Secondary malignant neoplasm of bone: Secondary | ICD-10-CM | POA: Diagnosis not present

## 2022-09-08 ENCOUNTER — Inpatient Hospital Stay: Payer: Medicare Other | Attending: Internal Medicine | Admitting: Physician Assistant

## 2022-09-08 DIAGNOSIS — C3492 Malignant neoplasm of unspecified part of left bronchus or lung: Secondary | ICD-10-CM

## 2022-09-08 DIAGNOSIS — D649 Anemia, unspecified: Secondary | ICD-10-CM | POA: Insufficient documentation

## 2022-09-08 DIAGNOSIS — F1290 Cannabis use, unspecified, uncomplicated: Secondary | ICD-10-CM | POA: Insufficient documentation

## 2022-09-08 DIAGNOSIS — I4891 Unspecified atrial fibrillation: Secondary | ICD-10-CM | POA: Insufficient documentation

## 2022-09-08 DIAGNOSIS — Z79899 Other long term (current) drug therapy: Secondary | ICD-10-CM | POA: Insufficient documentation

## 2022-09-08 DIAGNOSIS — Z923 Personal history of irradiation: Secondary | ICD-10-CM | POA: Insufficient documentation

## 2022-09-08 DIAGNOSIS — C7951 Secondary malignant neoplasm of bone: Secondary | ICD-10-CM | POA: Insufficient documentation

## 2022-09-08 DIAGNOSIS — F17210 Nicotine dependence, cigarettes, uncomplicated: Secondary | ICD-10-CM | POA: Insufficient documentation

## 2022-09-08 DIAGNOSIS — R197 Diarrhea, unspecified: Secondary | ICD-10-CM | POA: Insufficient documentation

## 2022-09-08 DIAGNOSIS — Z7901 Long term (current) use of anticoagulants: Secondary | ICD-10-CM | POA: Insufficient documentation

## 2022-09-08 DIAGNOSIS — Z7952 Long term (current) use of systemic steroids: Secondary | ICD-10-CM | POA: Insufficient documentation

## 2022-09-08 DIAGNOSIS — Z86718 Personal history of other venous thrombosis and embolism: Secondary | ICD-10-CM | POA: Insufficient documentation

## 2022-09-08 DIAGNOSIS — C3432 Malignant neoplasm of lower lobe, left bronchus or lung: Secondary | ICD-10-CM | POA: Insufficient documentation

## 2022-09-08 MED ORDER — DEXAMETHASONE 4 MG PO TABS: 4.0000 mg | ORAL_TABLET | Freq: Two times a day (BID) | ORAL | 0 refills | Status: DC

## 2022-09-10 NOTE — Progress Notes (Deleted)
[facility]  Telephone:([phone] Fax:([phone]) [phone]   Name: Albert Perez Date: 09/10/2022 MRN: [id number]  DOB: 1951/01/29  Patient Care Team: Default, Provider, MD as PCP - General Curt Bears, Ocie Doyne, MD as PCP - Electrophysiology (Cardiology) Lorretta Harp, MD as PCP - Cardiology (Cardiology) Gentry Fitz, MD as Consulting Physician (Orthopedic Surgery) [place], Albert Sax, NP as Nurse Practitioner (Nurse Practitioner)    I connected with Albert Perez on 08/09/22 at  1:00 PM EDT by phone and verified that I am speaking with the correct person using two identifiers.   I discussed the limitations, risks, security and privacy concerns of performing an evaluation and management service by telemedicine and the availability of in-person appointments. I also discussed with the patient that there may be a patient responsible charge related to this service. The patient expressed understanding and agreed to proceed.   Other persons participating in the visit and their role in the encounter: Albert Perez, wife and Albert Ferraiolo, RN   Patient's location: home  Provider's location: [facility]   Chief Complaint: follow up of symptom management   INTERVAL HISTORY: Albert Perez is a 72 y.o. 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 Recent scans  Palliative ask to see for.  Palliative ask to see for symptom management.  SOCIAL HISTORY:     reports that he has been smoking cigarettes. He has a 7.50 pack-year smoking history. He has never used smokeless tobacco. He reports current alcohol use of about 2.0 standard drinks of alcohol per week. He reports current drug use. Drug: Marijuana.  ADVANCE DIRECTIVES:    CODE  STATUS:   PAST MEDICAL HISTORY: Past Medical History:  Diagnosis Date   A-fib [facility])    PMH: 2000-2001   Arthritis    Cancer ([place])    melanoma 2018   Diarrhea    due to medications   DVT (deep venous thrombosis) (HCC)    Left brachial vein DVT (01/30/19, following fracture)   Dysrhythmia    afib -    ETOH abuse    GERD (gastroesophageal reflux disease)    ocassional   History of radiation therapy    Right Hip 02/08/22-02/19/22-Dr. Gery Pray   History of radiation therapy    Right Pelvis-05/06/22-05/19/22- Dr. Gery Pray   Lung cancer [facility]) dx'd 02/26/2019   Metastatic cancer to bone ([place]) dx'd 02/26/2019   Sleep apnea    do not wear CPAP   Stroke ([place]) 2019   " mini"   Vertigo    Wears glasses     ALLERGIES:  has No Known Allergies.  MEDICATIONS:  Current Outpatient Medications  Medication Sig Dispense Refill   buPROPion (WELLBUTRIN XL) 150 MG 24 hr tablet Take 150 mg by mouth daily as needed (nicotine cravings).     carvedilol (COREG) 6.25 MG tablet TAKE 1 TABLET BY MOUTH TWICE A DAY 180 tablet 3   Cholecalciferol (VITAMIN D-3) 25 MCG (1000 UT) CAPS Take 1,000 Units by mouth daily.     dexamethasone (DECADRON) 4 MG tablet Take 1 tablet (4 mg total) by mouth 2 (two) times daily. 30 tablet 0   ELIQUIS 5 MG TABS tablet TAKE 1 TABLET BY MOUTH TWICE A DAY 60 tablet 2   furosemide (LASIX) 20 MG tablet Take 20 mg by mouth daily.  gabapentin (NEURONTIN) 300 MG capsule Take 1 capsule (300 mg total) by mouth 2 (two) times daily. 60 capsule 0   loperamide (IMODIUM) 2 MG capsule Take 2 mg by mouth as needed for diarrhea or loose stools.     Magnesium 500 MG CAPS Take 1,000 mg by mouth daily.     Multiple Vitamin (MULTIVITAMIN PO) Take 1 tablet by mouth daily. Centrum     osimertinib mesylate (TAGRISSO) 80 MG tablet Take 1 tablet (80 mg total) by mouth daily. 60 tablet 3   OVER THE COUNTER MEDICATION Take 1 tablet by mouth daily. Standard Process, LivaPlex (Liver supplement)      oxyCODONE ER (XTAMPZA ER) 18 MG C12A Take 1 capsule by mouth every 8 (eight) hours. 60 capsule 0   oxyCODONE-acetaminophen (PERCOCET) 7.5-325 MG tablet Take 1-2 tablets by mouth every 6 (six) hours as needed for severe pain. 90 tablet 0   potassium chloride (KLOR-CON) 10 MEQ tablet Take 1 tablet (10 mEq total) by mouth daily. 90 tablet 1   rosuvastatin (CRESTOR) 5 MG tablet Take 5 mg by mouth daily.  2   tepotinib hcl (TEPMETKO) 225 MG tablet Take 2 tablets (450 mg total) by mouth daily. Take with food. 60 tablet 3   torsemide (DEMADEX) 20 MG tablet TAKE 1 TABLET BY MOUTH TWICE A DAY 180 tablet 1   Triamcinolone Acetonide (TRIAMCINOLONE 0.1 % CREAM : EUCERIN) CREA Apply 1 Application topically 2 (two) times daily. 1 each 0   TURMERIC PO Take 1 capsule by mouth daily.     vitamin B-12 (CYANOCOBALAMIN) 1000 MCG tablet Take 1,000 mcg by mouth daily.     No current facility-administered medications for this visit.    VITAL SIGNS: There were no vitals taken for this visit. There were no vitals filed for this visit.   Estimated body mass index is 25.31 kg/m as calculated from the following:   Height as of 08/04/22: 5' 7.5" (1.715 m).   Weight as of 08/04/22: 164 lb (74.4 kg).   PERFORMANCE STATUS (ECOG) : 2 - Symptomatic, <50% confined to bed  IMPRESSION: I was able to connect with Albert Perez by phone. No acute distress identified. Patient continues to feel better with each day. Wife confirms patient is now spending time in a hospital bed with air mattress overlay. His Albert Perez brothers have built him a ramp on their home for accessible wheelchair use.   Denies any signs of confusion or hallucinations. Denies nausea, vomiting, constipation, or diarrhea. Wife shares they have a wound care nurse coming out to the home to wrap legs and care for open wound. He also has sacral pressure wound which wife reports is now healing. They are turning him more frequently. Confirms he has an appointment next  week at wound care center.   Patient is requesting refill on oxycodone. I further clarify with wife on patient's use of pain medication. She confirms he is not having occasional pain in hip, groin, and sacral area. Also some lower extremity discomfort at times. Shares family has purchased [place] honey and is also using on areas.   Cameren has been weaned off of Xtampza however continues to tolerate oxycodone as needed. Does not require around the clock. Wife reports he will take every 6-8hrs. He is taking 1-2 tablets depending on pain severity. Education provided on safe use.   Patient is unable to come into office due to limited mobility at this time. They have agreed to maintain virtual contact until this improves. They  are not interested in physical therapy as offered. Mrs. Forshay is hopeful once ramp is placed this will make it easier to get out of the home in addition to hopes he will continue to improve.   PLAN: Oxycodone 7.5 mg every 6-8 hours as needed for breakthrough pain Discontinue Xtampza 18 mg.  Miralax daily for bowel regimen  We will continue to closely monitor and support as needed. I will plan to see patient back in 3-4 weeks in collaboration with his other oncology/radiation appointments. Patient and wife knows to contact our office sooner if needed.   Mrs. Gelardi expressed understanding and was in agreement with this plan. He also understands that He can call the clinic at any time with any questions, concerns, or complaints.    Any controlled substances utilized were prescribed in the context of palliative care. PDMP has been reviewed.    Time Total: 45 min   Visit consisted of counseling and education dealing with the complex and emotionally intense issues of symptom management and palliative care in the setting of serious and potentially life-threatening illness.Greater than 50%  of this time was spent counseling and coordinating care related to the above assessment and  plan.  Alda Lea, AGPCNP-BC  Palliative Medicine Team/[place]

## 2022-09-13 ENCOUNTER — Inpatient Hospital Stay: Payer: Medicare Other | Admitting: Nurse Practitioner

## 2022-09-13 ENCOUNTER — Telehealth: Payer: Self-pay | Admitting: *Deleted

## 2022-09-13 ENCOUNTER — Telehealth: Payer: Self-pay

## 2022-09-13 NOTE — Telephone Encounter (Signed)
Albert Perez called office. Requested orders for labs faxed to [facility] so they can be drawn  Faxed orders for CMP/CBC w/differential to [facility] [phone] Fax confirmation received

## 2022-09-13 NOTE — Telephone Encounter (Signed)
Per 3/11 IB reached out to patient to schedule ; left voicemail.

## 2022-09-17 ENCOUNTER — Telehealth: Payer: Self-pay | Admitting: Medical Oncology

## 2022-09-17 ENCOUNTER — Other Ambulatory Visit: Payer: Self-pay | Admitting: Medical Oncology

## 2022-09-17 ENCOUNTER — Telehealth: Payer: Self-pay | Admitting: Physician Assistant

## 2022-09-17 ENCOUNTER — Other Ambulatory Visit: Payer: Self-pay | Admitting: Physician Assistant

## 2022-09-17 ENCOUNTER — Other Ambulatory Visit: Payer: Self-pay | Admitting: *Deleted

## 2022-09-17 DIAGNOSIS — F17210 Nicotine dependence, cigarettes, uncomplicated: Secondary | ICD-10-CM | POA: Diagnosis not present

## 2022-09-17 DIAGNOSIS — R197 Diarrhea, unspecified: Secondary | ICD-10-CM

## 2022-09-17 DIAGNOSIS — F1290 Cannabis use, unspecified, uncomplicated: Secondary | ICD-10-CM | POA: Diagnosis not present

## 2022-09-17 DIAGNOSIS — C7951 Secondary malignant neoplasm of bone: Secondary | ICD-10-CM | POA: Diagnosis not present

## 2022-09-17 DIAGNOSIS — Z923 Personal history of irradiation: Secondary | ICD-10-CM | POA: Diagnosis not present

## 2022-09-17 DIAGNOSIS — C3492 Malignant neoplasm of unspecified part of left bronchus or lung: Secondary | ICD-10-CM

## 2022-09-17 DIAGNOSIS — Z86718 Personal history of other venous thrombosis and embolism: Secondary | ICD-10-CM | POA: Diagnosis not present

## 2022-09-17 DIAGNOSIS — Z79899 Other long term (current) drug therapy: Secondary | ICD-10-CM | POA: Diagnosis not present

## 2022-09-17 DIAGNOSIS — D649 Anemia, unspecified: Secondary | ICD-10-CM | POA: Diagnosis not present

## 2022-09-17 DIAGNOSIS — Z7901 Long term (current) use of anticoagulants: Secondary | ICD-10-CM | POA: Diagnosis not present

## 2022-09-17 DIAGNOSIS — I4891 Unspecified atrial fibrillation: Secondary | ICD-10-CM | POA: Diagnosis not present

## 2022-09-17 DIAGNOSIS — Z7952 Long term (current) use of systemic steroids: Secondary | ICD-10-CM | POA: Diagnosis not present

## 2022-09-17 DIAGNOSIS — C3432 Malignant neoplasm of lower lobe, left bronchus or lung: Secondary | ICD-10-CM | POA: Diagnosis not present

## 2022-09-17 LAB — CMP (CANCER CENTER ONLY)
ALT: 10 U/L (ref 0–44)
AST: 8 U/L — ABNORMAL LOW (ref 15–41)
Albumin: 2.9 g/dL — ABNORMAL LOW (ref 3.5–5.0)
Alkaline Phosphatase: 271 U/L — ABNORMAL HIGH (ref 38–126)
Anion gap: 4 — ABNORMAL LOW (ref 5–15)
BUN: 23 mg/dL (ref 8–23)
CO2: 26 mmol/L (ref 22–32)
Calcium: 7.9 mg/dL — ABNORMAL LOW (ref 8.9–10.3)
Chloride: 105 mmol/L (ref 98–111)
Creatinine: 0.73 mg/dL (ref 0.61–1.24)
GFR, Estimated: >60 mL/min (ref >60)
Glucose, Bld: 108 mg/dL — ABNORMAL HIGH (ref 70–99)
Potassium: 4 mmol/L (ref 3.5–5.1)
Sodium: 135 mmol/L (ref 135–145)
Total Bilirubin: 0.5 mg/dL (ref 0.3–1.2)
Total Protein: 5.2 g/dL — ABNORMAL LOW (ref 6.5–8.1)

## 2022-09-17 LAB — CBC WITH DIFFERENTIAL (CANCER CENTER ONLY)
Abs Immature Granulocytes: 0.09 10*3/uL — ABNORMAL HIGH (ref 0.00–0.07)
Basophils Absolute: 0 10*3/uL (ref 0.0–0.1)
Basophils Relative: 0 %
Eosinophils Absolute: 0 10*3/uL (ref 0.0–0.5)
Eosinophils Relative: 0 %
HCT: 36.3 % — ABNORMAL LOW (ref 39.0–52.0)
Hemoglobin: 12 g/dL — ABNORMAL LOW (ref 13.0–17.0)
Immature Granulocytes: 1 %
Lymphocytes Relative: 10 %
Lymphs Abs: 0.9 10*3/uL (ref 0.7–4.0)
MCH: 29.6 pg (ref 26.0–34.0)
MCHC: 33.1 g/dL (ref 30.0–36.0)
MCV: 89.4 fL (ref 80.0–100.0)
Monocytes Absolute: 1 10*3/uL (ref 0.1–1.0)
Monocytes Relative: 11 %
Neutro Abs: 7.1 10*3/uL (ref 1.7–7.7)
Neutrophils Relative %: 78 %
Platelet Count: 263 10*3/uL (ref 150–400)
RBC: 4.06 MIL/uL — ABNORMAL LOW (ref 4.22–5.81)
RDW: 17.6 % — ABNORMAL HIGH (ref 11.5–15.5)
WBC Count: 9.2 10*3/uL (ref 4.0–10.5)
nRBC: 0 % (ref 0.0–0.2)

## 2022-09-17 LAB — C DIFFICILE QUICK SCREEN W PCR REFLEX
C Diff antigen: NEGATIVE
C Diff interpretation: NOT DETECTED
C Diff toxin: NEGATIVE

## 2022-09-17 NOTE — Progress Notes (Unsigned)
Albert Perez  [phone] [phone]   Name: Albert Perez Date: 09/17/2022 MRN: [id number]  DOB: 1951-03-24  Patient Care Team: Default, Provider, MD as PCP - General Albert Perez, Albert Doyne, MD as PCP - Electrophysiology (Cardiology) Albert Harp, MD as PCP - Cardiology (Cardiology) Albert Fitz, MD as Consulting Physician (Orthopedic Surgery) [place], Albert Sax, NP as Nurse Practitioner (Nurse Practitioner)    I connected with Albert Perez on 08/09/22 at 11:30 AM EDT by video and verified that I am speaking with the correct person using two identifiers.   I discussed the limitations, risks, security and privacy concerns of performing an evaluation and management service by telemedicine and the availability of in-person appointments. I also discussed with the patient that there may be a patient responsible charge related to this service. The patient expressed understanding and agreed to proceed.   Other persons participating in the visit and their role in the encounter: Albert Perez, wife and Maygan, RN   Patient's location: home  Provider's location: [facility]   Chief Complaint: follow up of symptom management   INTERVAL HISTORY: Albert Perez is a 72 y.o. 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 Recent scans  Palliative ask to see for.  Palliative ask to see for symptom management.  SOCIAL HISTORY:     reports that he has been smoking cigarettes. He has a 7.50 pack-year smoking history. He has never used smokeless tobacco. He reports current alcohol use of about 2.0 standard drinks of alcohol per week. He reports current drug use. Drug: Marijuana.  ADVANCE DIRECTIVES:    CODE  STATUS:   PAST MEDICAL HISTORY: Past Medical History:  Diagnosis Date   A-fib [facility])    PMH: 2000-2001   Arthritis    Cancer ([place])    melanoma 2018   Diarrhea    due to medications   DVT (deep venous thrombosis) (HCC)    Left brachial vein DVT (01/30/19, following fracture)   Dysrhythmia    afib -    ETOH abuse    GERD (gastroesophageal reflux disease)    ocassional   History of radiation therapy    Right Hip 02/08/22-02/19/22-Dr. Gery Pray   History of radiation therapy    Right Pelvis-05/06/22-05/19/22- Dr. Gery Pray   Lung cancer [facility]) dx'd 02/26/2019   Metastatic cancer to bone ([place]) dx'd 02/26/2019   Sleep apnea    do not wear CPAP   Stroke ([place]) 2019   " mini"   Vertigo    Wears glasses     ALLERGIES:  has No Known Allergies.  MEDICATIONS:  Current Outpatient Medications  Medication Sig Dispense Refill   buPROPion (WELLBUTRIN XL) 150 MG 24 hr tablet Take 150 mg by mouth daily as needed (nicotine cravings).     carvedilol (COREG) 6.25 MG tablet TAKE 1 TABLET BY MOUTH TWICE A DAY 180 tablet 3   Cholecalciferol (VITAMIN D-3) 25 MCG (1000 UT) CAPS Take 1,000 Units by mouth daily.     dexamethasone (DECADRON) 4 MG tablet Take 1 tablet (4 mg total) by mouth 2 (two) times daily. 30 tablet 0   ELIQUIS 5 MG TABS tablet TAKE 1 TABLET BY MOUTH TWICE A DAY 60 tablet 2   furosemide (LASIX) 20 MG tablet Take 20 mg by mouth daily.  gabapentin (NEURONTIN) 300 MG capsule Take 1 capsule (300 mg total) by mouth 2 (two) times daily. 60 capsule 0   loperamide (IMODIUM) 2 MG capsule Take 2 mg by mouth as needed for diarrhea or loose stools.     Magnesium 500 MG CAPS Take 1,000 mg by mouth daily.     Multiple Vitamin (MULTIVITAMIN PO) Take 1 tablet by mouth daily. Centrum     osimertinib mesylate (TAGRISSO) 80 MG tablet Take 1 tablet (80 mg total) by mouth daily. 60 tablet 3   OVER THE COUNTER MEDICATION Take 1 tablet by mouth daily. Standard Process, LivaPlex (Liver supplement)      oxyCODONE ER (XTAMPZA ER) 18 MG C12A Take 1 capsule by mouth every 8 (eight) hours. 60 capsule 0   oxyCODONE-acetaminophen (PERCOCET) 7.5-325 MG tablet Take 1-2 tablets by mouth every 6 (six) hours as needed for severe pain. 90 tablet 0   potassium chloride (KLOR-CON) 10 MEQ tablet Take 1 tablet (10 mEq total) by mouth daily. 90 tablet 1   rosuvastatin (CRESTOR) 5 MG tablet Take 5 mg by mouth daily.  2   tepotinib hcl (TEPMETKO) 225 MG tablet Take 2 tablets (450 mg total) by mouth daily. Take with food. 60 tablet 3   torsemide (DEMADEX) 20 MG tablet TAKE 1 TABLET BY MOUTH TWICE A DAY 180 tablet 1   Triamcinolone Acetonide (TRIAMCINOLONE 0.1 % CREAM : EUCERIN) CREA Apply 1 Application topically 2 (two) times daily. 1 each 0   TURMERIC PO Take 1 capsule by mouth daily.     vitamin B-12 (CYANOCOBALAMIN) 1000 MCG tablet Take 1,000 mcg by mouth daily.     No current facility-administered medications for this visit.    VITAL SIGNS: There were no vitals taken for this visit. There were no vitals filed for this visit.   Estimated body mass index is 25.31 kg/m as calculated from the following:   Height as of 08/04/22: 5' 7.5" (1.715 m).   Weight as of 08/04/22: 164 lb (74.4 kg).   PERFORMANCE STATUS (ECOG) : 2 - Symptomatic, <50% confined to bed  IMPRESSION: I was able to connect with Albert Perez by phone. No acute distress identified. Wife shares they are taking things one day at a time. Denies nausea or vomiting.   Albert Perez states patient has been dealing with diarrhea over the past week however with some improvement since starting Imodium. He has only had 2 stools over the past 24 hours. She is now concerned that his appetite has decreased. Is not eating much but is taking in good amounts of fluids which is mainly water. We discussed focusing on small frequent meals, offering snacks, and increase in protein shakes. Albert Perez reports she has purchased some ensure plus however patient has not been  drinking. Encouraged Albert Perez to drink 1-2 daily for additional support. Wife knows to contact one of the team members if no improvement.   Overall patient is doing okay. Bedsores are healing per Albert Perez. Wife reports he was able to get up with 3 person assist to shower last week. Received cortisone injection to hip and groin which only made minimal difference. He continues to complain of some ongoing pain, however is not as severe as it previously was. We discussed his pain regimen. He has been weaned off of [place]. He is taking Percocet as needed. Wife reports he may take 1-2 times per day depending on level of pain.   We will continue to closely monitor and assist with symptom management  as needed. Wife knows to contact office if needed.   Patient is unable to come into office due to limited mobility at this time. They have agreed to maintain virtual contact until this improves. Wife is considering rehabilitation for patient.   PLAN: Oxycodone 7.5 mg every 6-8 hours as needed for breakthrough pain Xtampza successfully weaned off.  Miralax daily for bowel regimen however not currently taking due to loose stools. Managed by Imodium.  We will continue to closely monitor and support as needed. I will plan to see patient back in 3-4 weeks in collaboration with his other oncology/radiation appointments. Patient and wife knows to contact our office sooner if needed.   Mrs. Towe expressed understanding and was in agreement with this plan. He also understands that He can call the clinic at any time with any questions, concerns, or complaints.       Any controlled substances utilized were prescribed in the context of palliative care. PDMP has been reviewed.    Time Total: 45 min   Visit consisted of counseling and education dealing with the complex and emotionally intense issues of symptom management and palliative care in the setting of serious and potentially life-threatening illness.Greater than  50%  of this time was spent counseling and coordinating care related to the above assessment and plan.  Alda Lea, AGPCNP-BC  Palliative Medicine Team/[facility]

## 2022-09-17 NOTE — Telephone Encounter (Signed)
Cassie f/u with pt.

## 2022-09-17 NOTE — Telephone Encounter (Signed)
Watery stool x 2 days frequent episodes . HH RN states there is an "acetic' odor to stool .  Pt is drinking  ginger ale and water  "all the time " per wife. [place] reports it is hard to swallow large pills. He will start cutting the kdur in half.

## 2022-09-17 NOTE — Telephone Encounter (Signed)
I called the patient's wife and reviewed the labs.  The patient's CBC and CMP are acceptable.  No significant dehydration.  The patient's C. difficile testing came back negative.  I am still waiting for the results of his GI pathogen panel.  The patient's wife states that he has been drinking plenty of fluids and has taken Imodium AD with significant improvement in his diarrhea.  He is actually feeling a bit better at this time.  For now they will continue taking Imodium if needed for diarrhea and to hydrate well at home.  I let them know if anything came back abnormal on his GI pathogen panel that we would be in touch for further instructions.  His wife expressed understanding with the instructions.

## 2022-09-18 LAB — GASTROINTESTINAL PANEL BY PCR, STOOL (REPLACES STOOL CULTURE)

## 2022-09-20 ENCOUNTER — Encounter: Payer: Self-pay | Admitting: Physician Assistant

## 2022-09-20 ENCOUNTER — Other Ambulatory Visit: Payer: Self-pay | Admitting: Cardiology

## 2022-09-20 ENCOUNTER — Other Ambulatory Visit (HOSPITAL_COMMUNITY): Payer: Self-pay

## 2022-09-21 ENCOUNTER — Encounter: Payer: Self-pay | Admitting: Nurse Practitioner

## 2022-09-21 ENCOUNTER — Inpatient Hospital Stay (HOSPITAL_BASED_OUTPATIENT_CLINIC_OR_DEPARTMENT_OTHER): Payer: Medicare Other | Admitting: Nurse Practitioner

## 2022-09-21 DIAGNOSIS — R530 Neoplastic (malignant) related fatigue: Secondary | ICD-10-CM | POA: Diagnosis not present

## 2022-09-21 DIAGNOSIS — R630 Anorexia: Secondary | ICD-10-CM

## 2022-09-21 DIAGNOSIS — G893 Neoplasm related pain (acute) (chronic): Secondary | ICD-10-CM

## 2022-09-21 DIAGNOSIS — Z515 Encounter for palliative care: Secondary | ICD-10-CM

## 2022-09-23 ENCOUNTER — Inpatient Hospital Stay (HOSPITAL_BASED_OUTPATIENT_CLINIC_OR_DEPARTMENT_OTHER): Payer: Medicare Other | Admitting: Internal Medicine

## 2022-09-23 DIAGNOSIS — C3490 Malignant neoplasm of unspecified part of unspecified bronchus or lung: Secondary | ICD-10-CM

## 2022-09-23 NOTE — Progress Notes (Signed)
[place] [phone]   [phone]  PROGRESS NOTE FOR TELEMEDICINE VISITS  Default, Provider, MD (Inactive) No address on file  I connected withNAME@ on 09/23/22 at  3:30 PM EDT by telephone visit and verified that I am speaking with the correct person using two identifiers.   I discussed the limitations, risks, security and privacy concerns of performing an evaluation and management service by telemedicine and the availability of in-person appointments. I also discussed with the patient that there may be a patient responsible charge related to this service. The patient expressed understanding and agreed to proceed.  Other persons participating in the visit and their role in the encounter: Wife  Patient's location: Home Provider's location: [place]   DIAGNOSIS: Stage IV (T3, N0, M1c) non-small cell lung cancer, adenocarcinoma presented with cavitary left lower lobe lung mass in addition to other pulmonary nodules and scattered hypermetabolic osseous metastatic disease involving the spine, ribs and bony pelvis as well as left proximal humeral pathologic fracture.    Molecular Biomarkers performed by [employer] DETECTED ALTERATION(S) / Mayer [id number]  Afatinib, Dacomitinib, Erlotinib, Gefitinib, Osimertinib, Ramucirumab Neratinib   FGFR1Amplification  Erdafitinib, Lenvatinib, Nintedanib, Pazopanib, Pemigatinib, Ponatinib   [id number] None   Repeated Moleculars by [employer]: Positive for MET Amplification     PRIOR THERAPY:  1) Palliative radiotherapy to the the left humerus under the care of Dr. Sondra Come. Last treatment ~04/24/2019.  2) Palliative radiation to the Right Pelvis/Ilium under the care of Dr. Sondra Come. Last dose 05/19/22.    CURRENT THERAPY: 1) Targeted therapy with Tagrisso 80 p.o. daily.  He started the first dose on April 21, 2019. Tepmetko 450 mg p.o. daily starting on 08/24/22.   2) Xgeva every 4 weeks, last dose 04/27/22.   INTERVAL HISTORY: Albert Perez 72 y.o. male has a telephone virtual visit with me today for evaluation and recommendation regarding his condition.  The patient is feeling much better these days with less fatigue and weakness as well as less pain in the right hip area.  He has oxycodone only twice a day now.  He denied having any current chest pain, shortness of breath, cough or hemoptysis.  He has no nausea, vomiting, diarrhea but has occasional constipation.  He is tolerating the combination of Tagrisso and Tepotinib (Tepmetko) fairly well.  He had blood work performed recently that was unremarkable except for mild anemia.  MEDICAL HISTORY: Past Medical History:  Diagnosis Date   A-fib [facility])    PMH: 2000-2001   Arthritis    Cancer ([place])    melanoma 2018   Diarrhea    due to medications   DVT (deep venous thrombosis) (HCC)    Left brachial vein DVT (01/30/19, following fracture)   Dysrhythmia    afib -    ETOH abuse    GERD (gastroesophageal reflux disease)    ocassional   History of radiation therapy    Right Hip 02/08/22-02/19/22-Dr. Gery Pray   History of radiation therapy    Right Pelvis-05/06/22-05/19/22- Dr. Gery Pray   Lung cancer [facility]) dx'd 02/26/2019   Metastatic cancer to bone ([place]) dx'd 02/26/2019   Sleep apnea    do not wear CPAP   Stroke ([place]) 2019   " mini"   Vertigo    Wears glasses     ALLERGIES:  has No Known Allergies.  MEDICATIONS:  Current Outpatient Medications  Medication Sig Dispense Refill   buPROPion (WELLBUTRIN XL) 150  MG 24 hr tablet Take 150 mg by mouth daily as needed (nicotine cravings).     carvedilol (COREG) 6.25 MG tablet TAKE 1 TABLET BY MOUTH TWICE A DAY 180 tablet 3   Cholecalciferol (VITAMIN D-3) 25 MCG (1000 UT) CAPS Take 1,000 Units by mouth daily.     dexamethasone (DECADRON) 4 MG tablet Take 1 tablet (4 mg total) by mouth 2 (two) times daily. 30 tablet 0   ELIQUIS 5 MG TABS tablet  TAKE 1 TABLET BY MOUTH TWICE A DAY 60 tablet 2   furosemide (LASIX) 20 MG tablet Take 20 mg by mouth daily.     gabapentin (NEURONTIN) 300 MG capsule Take 1 capsule (300 mg total) by mouth 2 (two) times daily. 60 capsule 0   loperamide (IMODIUM) 2 MG capsule Take 2 mg by mouth as needed for diarrhea or loose stools.     Magnesium 500 MG CAPS Take 1,000 mg by mouth daily.     Multiple Vitamin (MULTIVITAMIN PO) Take 1 tablet by mouth daily. Centrum     osimertinib mesylate (TAGRISSO) 80 MG tablet Take 1 tablet (80 mg total) by mouth daily. 60 tablet 3   OVER THE COUNTER MEDICATION Take 1 tablet by mouth daily. Standard Process, LivaPlex (Liver supplement)     oxyCODONE ER (XTAMPZA ER) 18 MG C12A Take 1 capsule by mouth every 8 (eight) hours. 60 capsule 0   oxyCODONE-acetaminophen (PERCOCET) 7.5-325 MG tablet Take 1-2 tablets by mouth every 6 (six) hours as needed for severe pain. 90 tablet 0   potassium chloride (KLOR-CON) 10 MEQ tablet Take 1 tablet (10 mEq total) by mouth daily. 90 tablet 1   rosuvastatin (CRESTOR) 5 MG tablet Take 5 mg by mouth daily.  2   tepotinib hcl (TEPMETKO) 225 MG tablet Take 2 tablets (450 mg total) by mouth daily. Take with food. 60 tablet 3   torsemide (DEMADEX) 20 MG tablet TAKE 1 TABLET BY MOUTH TWICE A DAY 180 tablet 1   Triamcinolone Acetonide (TRIAMCINOLONE 0.1 % CREAM : EUCERIN) CREA Apply 1 Application topically 2 (two) times daily. 1 each 0   TURMERIC PO Take 1 capsule by mouth daily.     vitamin B-12 (CYANOCOBALAMIN) 1000 MCG tablet Take 1,000 mcg by mouth daily.     No current facility-administered medications for this visit.    SURGICAL HISTORY:  Past Surgical History:  Procedure Laterality Date   ANKLE FRACTURE SURGERY Right 2003   COLONOSCOPY  2018   ELBOW SURGERY Left 1964   INGUINAL HERNIA REPAIR Bilateral 11/12/2020   Procedure: BILATERAL OPEN INGUINAL HERNIA REPAIR WITH MESH;  Surgeon: Coralie Keens, MD;  Location: [facility];  Service: General;   Laterality: Bilateral;   melanoma removal  04/2017   Lt shoulder   RETINAL DETACHMENT SURGERY Right 1971   TONSILLECTOMY     VIDEO BRONCHOSCOPY WITH ENDOBRONCHIAL NAVIGATION N/A 03/28/2019   Procedure: VIDEO BRONCHOSCOPY WITH ENDOBRONCHIAL NAVIGATION and Biopsies;  Surgeon: Collene Gobble, MD;  Location: [place];  Service: Thoracic;  Laterality: N/A;    REVIEW OF SYSTEMS:  A comprehensive review of systems was negative except for: Constitutional: positive for fatigue Musculoskeletal: positive for arthralgias and bone pain    LABORATORY DATA: Lab Results  Component Value Date   WBC 9.2 09/17/2022   HGB 12.0 (L) 09/17/2022   HCT 36.3 (L) 09/17/2022   MCV 89.4 09/17/2022   PLT 263 09/17/2022      Chemistry      Component Value Date/Time  NA 135 09/17/2022 0900   NA 138 06/10/2022 1006   K 4.0 09/17/2022 0900   CL 105 09/17/2022 0900   CO2 26 09/17/2022 0900   BUN 23 09/17/2022 0900   BUN 17 06/10/2022 1006   CREATININE 0.73 09/17/2022 0900      Component Value Date/Time   CALCIUM 7.9 (L) 09/17/2022 0900   ALKPHOS 271 (H) 09/17/2022 0900   AST 8 (L) 09/17/2022 0900   ALT 10 09/17/2022 0900   BILITOT 0.5 09/17/2022 0900       RADIOGRAPHIC STUDIES: No results found.  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  The patient started targeted therapy with Tagrisso 80 mg p.o. daily since April 21, 2019 and he has been tolerating his treatment well except for recent disease progression.  He had repeat molecular studies by [employer] that showed positive MET amplification.  I added Tepotinib (Tepmetko) to his treatment at a dose of 450 mg p.o. daily started August 24, 2022.  The patient started feeling much better after starting  the treatment.  He has more energy and started moving a little bit compared to before.  He has no significant adverse effect from the treatment with the combination of Tagrisso and Tepotinib (Tepmetko). He has been on this treatment for around 4 weeks now.  He is tolerating it fairly well I recommended for him to continue his current treatment with the combination of Tagrisso and Tepotinib (Tepmetko). I will see him back for follow-up visit in 1 months for evaluation and repeat blood work as well as CT scan of the chest, abdomen and pelvis for restaging of his disease. The patient was advised to call immediately if he has any concerning symptoms in the interval. I discussed the assessment and treatment plan with the patient. The patient was provided an opportunity to ask questions and all were answered. The patient agreed with the plan and demonstrated an understanding of the instructions.   The patient was advised to call back or seek an in-person evaluation if the symptoms worsen or if the condition fails to improve as anticipated.  I provided 15 minutes of non face-to-face telephone visit time during this encounter, and > 50% was spent counseling as documented under my assessment & plan.  Eilleen Kempf, MD 09/23/2022 3:25 PM  Disclaimer: This note was dictated with voice recognition software. Similar sounding words can inadvertently be transcribed and may not be corrected upon review.

## 2022-09-24 ENCOUNTER — Other Ambulatory Visit (HOSPITAL_COMMUNITY): Payer: Self-pay

## 2022-09-24 ENCOUNTER — Other Ambulatory Visit: Payer: Self-pay

## 2022-09-24 DIAGNOSIS — G893 Neoplasm related pain (acute) (chronic): Secondary | ICD-10-CM

## 2022-09-24 DIAGNOSIS — C3490 Malignant neoplasm of unspecified part of unspecified bronchus or lung: Secondary | ICD-10-CM

## 2022-09-24 DIAGNOSIS — Z515 Encounter for palliative care: Secondary | ICD-10-CM

## 2022-09-24 MED ORDER — XTAMPZA ER 18 MG PO C12A: 1.0000 | EXTENDED_RELEASE_CAPSULE | Freq: Three times a day (TID) | ORAL | 0 refills | Status: DC

## 2022-09-24 MED ORDER — OXYCODONE-ACETAMINOPHEN 7.5-325 MG PO TABS: 1.0000 | ORAL_TABLET | Freq: Four times a day (QID) | ORAL | 0 refills | Status: DC | PRN

## 2022-09-27 ENCOUNTER — Other Ambulatory Visit: Payer: Self-pay | Admitting: Medical Oncology

## 2022-09-27 ENCOUNTER — Other Ambulatory Visit (HOSPITAL_COMMUNITY): Payer: Self-pay

## 2022-09-27 DIAGNOSIS — I2692 Saddle embolus of pulmonary artery without acute cor pulmonale: Secondary | ICD-10-CM

## 2022-09-27 MED ORDER — APIXABAN 5 MG PO TABS: 5.0000 mg | ORAL_TABLET | Freq: Two times a day (BID) | ORAL | 2 refills | Status: DC

## 2022-09-30 ENCOUNTER — Telehealth: Payer: Self-pay | Admitting: Medical Oncology

## 2022-09-30 ENCOUNTER — Telehealth: Payer: Self-pay | Admitting: Internal Medicine

## 2022-09-30 ENCOUNTER — Encounter: Payer: Self-pay | Admitting: Medical Oncology

## 2022-09-30 NOTE — Telephone Encounter (Signed)
Called patient regarding upcoming April and appointments, patient is notified.

## 2022-09-30 NOTE — Telephone Encounter (Signed)
Immobility/Inpt rehab- [facility] is not getting up at all". Blanch Media thinks Larson needs a short period of inpatient rehab @ [facility]. Kaydn said he may consider it .  I told [place] and Blanch Media to talk to Palliative care nurse tomorrow and get their opinion.  Appetite decreased- Blanch Media asking about appetite stimulant. He is losing weight. She and Jahier said the CBD gummies worked ,but she can't get them until next week. I told her to be sure to get them .  Suggestions mailed to pt.  Eat several small meals throughout the day, spaced 2-3 hours apart. Eat more when your appetite is best. Use condiments to add calories to your food, such as adding extra butter, sour cream, oils, cheese, whole milk, whipped cream, mayonnaise, salad dressing, jelly, jam, syrup, and honey. Avoid drinking beverages with meals. These take up room in your stomach, making you feel full faster. Save them for in-between meals. Avoid foods labelled "lite" or "diet".  Next appt 04/18

## 2022-10-04 ENCOUNTER — Encounter: Payer: Self-pay | Admitting: Internal Medicine

## 2022-10-04 ENCOUNTER — Other Ambulatory Visit: Payer: Self-pay

## 2022-10-04 ENCOUNTER — Other Ambulatory Visit (HOSPITAL_COMMUNITY): Payer: Self-pay

## 2022-10-04 ENCOUNTER — Telehealth: Payer: Self-pay | Admitting: Medical Oncology

## 2022-10-04 NOTE — Telephone Encounter (Signed)
Tepotinib update- Pt stopped Tepotinib on sat and he said I feel "so much better. My appetite is better, I am  having normal BM , I am sitting up longer and have the  increased motivation do do this"  Scan expected 04/18 and f/u with Cassie.

## 2022-10-04 NOTE — Telephone Encounter (Signed)
He can hold it for 5 to 7 days and then resume again.  Thank you  Wife notified.

## 2022-10-05 ENCOUNTER — Telehealth: Payer: Self-pay | Admitting: Internal Medicine

## 2022-10-05 NOTE — Telephone Encounter (Signed)
Called patient per 4/2 IB message to reschedule appointments due to CT scan. Spoke with patient's spouse. Rescheduled appointments due to patient being wheel chair bound as phone visits. Patient will be notified.

## 2022-10-08 DIAGNOSIS — C7951 Secondary malignant neoplasm of bone: Secondary | ICD-10-CM | POA: Diagnosis not present

## 2022-10-13 ENCOUNTER — Telehealth: Payer: Self-pay | Admitting: Medical Oncology

## 2022-10-13 NOTE — Telephone Encounter (Signed)
Side effects -After restarting his Tepmetko 3 days ago he started again having   "loose stools, stomach cramps , lethargic , no energy".  He has labs and scan on 04/23 and has transportation arranged.   Should he stop Tepmetko?  Draining sore below left knee with "pus oozing under scab" Sunday had Temp 100.8 and it went down to normal without tylenol.Wife requesting wound center appt. Wound center appt on May 14 th @1330 .

## 2022-10-14 ENCOUNTER — Encounter: Payer: Self-pay | Admitting: Internal Medicine

## 2022-10-14 NOTE — Telephone Encounter (Signed)
Per Dr. Arbutus Ped , take it every other day for now.  He will need to reach out to his primary care physician or wound care clinic for management of the leg issue. This information given to [place]. I told her to call Aswad's PCP for skin issues and to sign off on Wound care clinic appt.May 14 th since it is not related to oncology.

## 2022-10-19 ENCOUNTER — Emergency Department (HOSPITAL_COMMUNITY): Payer: Medicare Other

## 2022-10-19 ENCOUNTER — Encounter (HOSPITAL_COMMUNITY): Payer: Self-pay

## 2022-10-19 ENCOUNTER — Other Ambulatory Visit: Payer: Self-pay

## 2022-10-19 ENCOUNTER — Emergency Department (HOSPITAL_COMMUNITY)
Admission: EM | Admit: 2022-10-19 | Discharge: 2022-10-20 | Disposition: A | Discharge status: Home / Self Care | Payer: Medicare Other | Attending: Emergency Medicine | Admitting: Emergency Medicine

## 2022-10-19 ENCOUNTER — Ambulatory Visit (HOSPITAL_COMMUNITY): Payer: Medicare Other

## 2022-10-19 DIAGNOSIS — S81801D Unspecified open wound, right lower leg, subsequent encounter: Secondary | ICD-10-CM | POA: Insufficient documentation

## 2022-10-19 DIAGNOSIS — I4891 Unspecified atrial fibrillation: Secondary | ICD-10-CM | POA: Insufficient documentation

## 2022-10-19 DIAGNOSIS — S91001D Unspecified open wound, right ankle, subsequent encounter: Secondary | ICD-10-CM | POA: Insufficient documentation

## 2022-10-19 DIAGNOSIS — I499 Cardiac arrhythmia, unspecified: Secondary | ICD-10-CM | POA: Diagnosis not present

## 2022-10-19 DIAGNOSIS — X58XXXA Exposure to other specified factors, initial encounter: Secondary | ICD-10-CM | POA: Insufficient documentation

## 2022-10-19 DIAGNOSIS — S91301A Unspecified open wound, right foot, initial encounter: Secondary | ICD-10-CM | POA: Diagnosis not present

## 2022-10-19 DIAGNOSIS — S82831A Other fracture of upper and lower end of right fibula, initial encounter for closed fracture: Secondary | ICD-10-CM | POA: Diagnosis not present

## 2022-10-19 DIAGNOSIS — Z743 Need for continuous supervision: Secondary | ICD-10-CM | POA: Diagnosis not present

## 2022-10-19 DIAGNOSIS — R6889 Other general symptoms and signs: Secondary | ICD-10-CM | POA: Diagnosis not present

## 2022-10-19 DIAGNOSIS — L089 Local infection of the skin and subcutaneous tissue, unspecified: Secondary | ICD-10-CM | POA: Diagnosis not present

## 2022-10-19 DIAGNOSIS — S81809D Unspecified open wound, unspecified lower leg, subsequent encounter: Secondary | ICD-10-CM | POA: Diagnosis not present

## 2022-10-19 DIAGNOSIS — I10 Essential (primary) hypertension: Secondary | ICD-10-CM | POA: Insufficient documentation

## 2022-10-19 LAB — CBC WITH DIFFERENTIAL/PLATELET
Abs Immature Granulocytes: 0.09 10*3/uL — ABNORMAL HIGH (ref 0.00–0.07)
Basophils Absolute: 0 10*3/uL (ref 0.0–0.1)
Basophils Relative: 0 %
Eosinophils Absolute: 0.1 10*3/uL (ref 0.0–0.5)
Eosinophils Relative: 1 %
HCT: 28.5 % — ABNORMAL LOW (ref 39.0–52.0)
Hemoglobin: 9.2 g/dL — ABNORMAL LOW (ref 13.0–17.0)
Immature Granulocytes: 1 %
Lymphocytes Relative: 16 %
Lymphs Abs: 1.2 10*3/uL (ref 0.7–4.0)
MCH: 30.6 pg (ref 26.0–34.0)
MCHC: 32.3 g/dL (ref 30.0–36.0)
MCV: 94.7 fL (ref 80.0–100.0)
Monocytes Absolute: 1 10*3/uL (ref 0.1–1.0)
Monocytes Relative: 13 %
Neutro Abs: 5.1 10*3/uL (ref 1.7–7.7)
Neutrophils Relative %: 69 %
Platelets: 260 10*3/uL (ref 150–400)
RBC: 3.01 MIL/uL — ABNORMAL LOW (ref 4.22–5.81)
RDW: 19 % — ABNORMAL HIGH (ref 11.5–15.5)
WBC: 7.4 10*3/uL (ref 4.0–10.5)
nRBC: 0 % (ref 0.0–0.2)

## 2022-10-19 LAB — BASIC METABOLIC PANEL
Anion gap: 4 — ABNORMAL LOW (ref 5–15)
BUN: 20 mg/dL (ref 8–23)
CO2: 24 mmol/L (ref 22–32)
Calcium: 6.7 mg/dL — ABNORMAL LOW (ref 8.9–10.3)
Chloride: 109 mmol/L (ref 98–111)
Creatinine, Ser: 0.65 mg/dL (ref 0.61–1.24)
GFR, Estimated: >60 mL/min (ref >60)
Glucose, Bld: 103 mg/dL — ABNORMAL HIGH (ref 70–99)
Potassium: 3.5 mmol/L (ref 3.5–5.1)
Sodium: 137 mmol/L (ref 135–145)

## 2022-10-19 LAB — CBG MONITORING, ED: Glucose-Capillary: 94 mg/dL (ref 70–99)

## 2022-10-19 MED ORDER — BACITRACIN ZINC 500 UNIT/GM EX OINT: 1.0000 | TOPICAL_OINTMENT | Freq: Two times a day (BID) | CUTANEOUS | 0 refills | Status: DC

## 2022-10-19 MED ORDER — BACITRACIN ZINC 500 UNIT/GM EX OINT: TOPICAL_OINTMENT | Freq: Once | CUTANEOUS | Status: AC

## 2022-10-19 MED ORDER — SULFAMETHOXAZOLE-TRIMETHOPRIM 800-160 MG PO TABS
1.0000 | ORAL_TABLET | Freq: Once | ORAL | Status: AC
  Administered 2022-10-19: 1 via ORAL
  Filled 2022-10-19: qty 1

## 2022-10-19 MED ORDER — SULFAMETHOXAZOLE-TRIMETHOPRIM 800-160 MG PO TABS: 1.0000 | ORAL_TABLET | Freq: Two times a day (BID) | ORAL | 0 refills | Status: AC

## 2022-10-19 NOTE — ED Provider Notes (Signed)
[facility] Provider Note   CSN: [id number] Arrival date & time: 10/19/22  1905     History  No chief complaint on file.   Albert Perez is a 72 y.o. male.  Patient is a 72 year old male with a past medical history of metastatic lung cancer on chemotherapy, A-fib on Eliquis, hypertension and presenting to the emergency department with right lower extremity wounds.  Patient is here with his wife who reports that he has had wounds on his legs for the last several weeks.  She states that she has been doing dressing changes for him at home and he was seen by wound care nurse today who is concerned that they are infected and recommended that he come to the emergency department.  His wife states that the wound on his heel and ankle have been improving but the wound near his right knee has been increasing in size.  Patient denies any fevers.  He states that he has chronic pain in his right hip from his cancer but denies any new or increasing pain in his legs.  The history is provided by the patient and the spouse.       Home Medications Prior to Admission medications   Medication Sig Start Date End Date Taking? Authorizing Provider  bacitracin ointment Apply 1 Application topically 2 (two) times daily. 10/19/22  Yes Theresia Lo, Benetta Spar K, DO  sulfamethoxazole-trimethoprim (BACTRIM DS) 800-160 MG tablet Take 1 tablet by mouth 2 (two) times daily for 7 days. 10/19/22 10/26/22 Yes Theresia Lo, Benetta Spar K, DO  apixaban (ELIQUIS) 5 MG TABS tablet Take 1 tablet (5 mg total) by mouth 2 (two) times daily. 09/27/22   Si Gaul, MD  buPROPion (WELLBUTRIN XL) 150 MG 24 hr tablet Take 150 mg by mouth daily as needed (nicotine cravings). 10/23/20   [provider]  carvedilol (COREG) 6.25 MG tablet TAKE 1 TABLET BY MOUTH TWICE A DAY 09/20/22   Camnitz, Andree Coss, MD  Cholecalciferol (VITAMIN D-3) 25 MCG (1000 UT) CAPS Take 1,000 Units by mouth daily.     [provider]  dexamethasone (DECADRON) 4 MG tablet Take 1 tablet (4 mg total) by mouth 2 (two) times daily. 09/08/22   Heilingoetter, Cassandra L, PA-C  furosemide (LASIX) 20 MG tablet Take 20 mg by mouth daily. 06/26/22   [provider]  gabapentin (NEURONTIN) 300 MG capsule Take 1 capsule (300 mg total) by mouth 2 (two) times daily. 07/13/22   Pickenpack-Cousar, Arty Baumgartner, NP  loperamide (IMODIUM) 2 MG capsule Take 2 mg by mouth as needed for diarrhea or loose stools.    [provider]  Magnesium 500 MG CAPS Take 1,000 mg by mouth daily.    [provider]  Multiple Vitamin (MULTIVITAMIN PO) Take 1 tablet by mouth daily. Centrum    [provider]  osimertinib mesylate (TAGRISSO) 80 MG tablet Take 1 tablet (80 mg total) by mouth daily. 06/23/22   Si Gaul, MD  OVER THE COUNTER MEDICATION Take 1 tablet by mouth daily. Standard Process, LivaPlex (Liver supplement)    [provider]  oxyCODONE ER (XTAMPZA ER) 18 MG C12A Take 1 capsule by mouth every 8 (eight) hours. 09/24/22   Pickenpack-Cousar, Arty Baumgartner, NP  oxyCODONE-acetaminophen (PERCOCET) 7.5-325 MG tablet Take 1-2 tablets by mouth every 6 (six) hours as needed for severe pain. 09/24/22   Pickenpack-Cousar, Arty Baumgartner, NP  potassium chloride (KLOR-CON) 10 MEQ tablet Take 1 tablet (10 mEq total) by mouth  daily. 06/23/22 12/20/22  Marjie Skiff E, PA-C  rosuvastatin (CRESTOR) 5 MG tablet Take 5 mg by mouth daily. 04/26/18   [provider]  tepotinib hcl (TEPMETKO) 225 MG tablet Take 2 tablets (450 mg total) by mouth daily. Take with food. 08/09/22   Si Gaul, MD  torsemide (DEMADEX) 20 MG tablet TAKE 1 TABLET BY MOUTH TWICE A DAY 08/27/22   Cannon Kettle, PA-C  Triamcinolone Acetonide (TRIAMCINOLONE 0.1 % CREAM : EUCERIN) CREA Apply 1 Application topically 2 (two) times daily. 07/20/22   Derwood Kaplan, MD  TURMERIC PO Take 1 capsule by mouth daily.    [provider]  vitamin B-12 (CYANOCOBALAMIN) 1000 MCG tablet Take 1,000 mcg by mouth daily.    [provider]      Allergies    Patient has no known allergies.    Review of Systems   Review of Systems  Physical Exam Updated Vital Signs BP (!) 101/50   Pulse 61   Temp 97.9 F (36.6 C)   Resp 15   Ht 5' 7.5" (1.715 m)   Wt 65.8 kg   SpO2 100%   BMI 22.38 kg/m  Physical Exam Vitals and nursing note reviewed.  Constitutional:      General: He is not in acute distress.    Appearance: Normal appearance.  HENT:     Head: Normocephalic and atraumatic.     Nose: Nose normal.     Mouth/Throat:     Mouth: Mucous membranes are moist.     Pharynx: Oropharynx is clear.  Eyes:     Extraocular Movements: Extraocular movements intact.     Conjunctiva/sclera: Conjunctivae normal.  Cardiovascular:     Rate and Rhythm: Normal rate.     Pulses: Normal pulses.     Heart sounds: Normal heart sounds.  Pulmonary:     Effort: Pulmonary effort is normal.  Abdominal:     General: Abdomen is flat.  Musculoskeletal:        General: Normal range of motion.     Cervical back: Normal range of motion.     Right lower leg: Edema (To foot) present.  Skin:    General: Skin is warm and dry.     Comments: Approximately 2 cm in diameter superficial wound to the right lateral leg near the fibular head with some mild oozing of blood and mild purulent drainage, mild surrounding erythema and warmth To approximately 1 cm in diameter wounds to right lateral malleolus with no purulent drainage and minimal surrounding erythema, no significant warmth Superficial wound to right posterior calf with no surrounding erythema or warmth  Neurological:     General: No focal deficit present.     Mental Status: He is alert and oriented to person, place, and time.  Psychiatric:        Mood and Affect: Mood normal.        Behavior: Behavior normal.            ED Results / Procedures / Treatments    Labs (all labs ordered are listed, but only abnormal results are displayed) Labs Reviewed  CBC WITH DIFFERENTIAL/PLATELET - Abnormal; Notable for the following components:      Result Value   RBC 3.01 (*)    Hemoglobin 9.2 (*)    HCT 28.5 (*)    RDW 19.0 (*)    Abs Immature Granulocytes 0.09 (*)    All other components within normal limits  BASIC METABOLIC PANEL - Abnormal; Notable  for the following components:   Glucose, Bld 103 (*)    Calcium 6.7 (*)    Anion gap 4 (*)    All other components within normal limits  CBG MONITORING, ED    EKG EKG Interpretation  Date/Time:  Tuesday October 19 2022 19:20:57 EDT Ventricular Rate:  61 PR Interval:  139 QRS Duration: 95 QT Interval:  433 QTC Calculation: 437 R Axis:   75 Text Interpretation: Sinus arrhythmia Borderline low voltage, extremity leads No significant change since last tracing Confirmed by Elayne Snare ([id number]) on 10/19/2022 7:46:10 PM  Radiology DG Tibia/Fibula Right  Result Date: 10/19/2022 CLINICAL DATA:  foot wound eval for deep infection EXAM: RIGHT TIBIA AND FIBULA - 2 VIEW COMPARISON:  None Available. FINDINGS: Plate and screw fixation of distal fibular shaft. 2 orthopedic screws across the medial malleolus. No fracture or dislocation. Small calcaneal spur. 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 Regional soft tissues unremarkable. IMPRESSION: Negative. Electronically Signed   By: Corlis Leak M.D.   On: 10/19/2022 20:40   DG Foot 2 Views Right  Result Date: 10/19/2022 CLINICAL DATA:  16109 Infection [id number] EXAM: RIGHT FOOT - 2 VIEW COMPARISON:  None Available. FINDINGS: Solid-appearing plate and screw fixation of distal fibular shaft. 2 orthopedic screws across the medial malleolus. No acute fracture. Small calcaneal spur. IMPRESSION: 1. No acute findings. 2. ORIF of distal fibular and medial malleolar fractures. Electronically Signed   By: Corlis Leak M.D.   On:  10/19/2022 20:39    Procedures Procedures    Medications Ordered in ED Medications  sulfamethoxazole-trimethoprim (BACTRIM DS) 800-160 MG per tablet 1 tablet (has no administration in time range)  bacitracin ointment (has no administration in time range)    ED Course/ Medical Decision Making/ A&P Clinical Course as of 10/19/22 2228  Tue Oct 19, 2022  2139 Mildly worsening anemia from baseline, labs otherwise without signs of sepsis and XR without signs of osteo. He will be treated with antibiotics and recommended outpatient follow up. [VK]    Clinical Course User Index [VK] Rexford Maus, DO                             Medical Decision Making This patient presents to the ED with chief complaint(s) of LE wound with pertinent past medical history of metastatic lung cancer on chemo, a fib on Eliquis, HTN which further complicates the presenting complaint. The complaint involves an extensive differential diagnosis and also carries with it a high risk of complications and morbidity.    The differential diagnosis includes cellulitis, wound infection, no palpable fluctuance making abscess unlikely, no crepitus and wound has been ongoing for 2 weeks making nec Fasc unlikely, osteomyelitis, patient is hemodynamically stable with normal vitals making sepsis less likely, considering neutropenia in the setting of his infection  Additional history obtained: Additional history obtained from spouse Records reviewed outpatient oncology records  ED Course and Reassessment: On patient's arrival to the emergency department he is afebrile, well-appearing in no acute distress.  He does have some purulence to his wound on his right lateral shin near the fibular head, other wounds appear well-healing without signs of infection.  Will have x-rays performed to evaluate for osteomyelitis or deep infection as well as labs to evaluate for neutropenia or signs of sepsis in the setting of his  immunocompromise state.  Independent labs interpretation:  The following labs were independently interpreted:  Mildly worsening anemia compared to baseline, labs otherwise within normal range  Independent visualization of imaging: - I independently visualized the following imaging with scope of interpretation limited to determining acute life threatening conditions related to emergency care: Right lower extremity tib-fib/foot x-ray, which revealed no acute disease  Consultation: - Consulted or discussed management/test interpretation w/ external professional: N/A  Consideration for admission or further workup: Patient has no emergent conditions requiring admission or further work-up at this time and is stable for discharge home with primary care follow-up  Social Determinants of health: N/A    Amount and/or Complexity of Data Reviewed Labs: ordered. Radiology: ordered.  Risk Prescription drug management.          Final Clinical Impression(s) / ED Diagnoses Final diagnoses:  Infected wound    Rx / DC Orders ED Discharge Orders          Ordered    sulfamethoxazole-trimethoprim (BACTRIM DS) 800-160 MG tablet  2 times daily        10/19/22 2227    bacitracin ointment  2 times daily        10/19/22 2227              Rexford Maus, DO 10/19/22 2228

## 2022-10-19 NOTE — Discharge Instructions (Signed)
You were seen in the emergency department for your leg wounds.  The wound near your right knee did not appear infected you had no signs of bone infection or sepsis.  I have given you antibiotics and you should complete the course as prescribed.  You can also do daily dressing changes with antibiotic ointment.  You should follow-up with your primary doctor to have your wounds rechecked in the next 2 to 3 days.  You should return to the emergency department if you are having streaking redness from your wounds, fevers despite the antibiotics, or if you have any other new or concerning symptoms.

## 2022-10-19 NOTE — ED Triage Notes (Signed)
Arrived via EMS from home, home health nurse think leg wound is infected.

## 2022-10-20 ENCOUNTER — Telehealth: Payer: Medicare Other

## 2022-10-20 DIAGNOSIS — R531 Weakness: Secondary | ICD-10-CM | POA: Diagnosis not present

## 2022-10-20 DIAGNOSIS — Z7401 Bed confinement status: Secondary | ICD-10-CM | POA: Diagnosis not present

## 2022-10-21 ENCOUNTER — Other Ambulatory Visit: Payer: Medicare Other

## 2022-10-21 ENCOUNTER — Inpatient Hospital Stay: Payer: Medicare Other

## 2022-10-26 ENCOUNTER — Other Ambulatory Visit: Payer: Self-pay

## 2022-10-26 ENCOUNTER — Ambulatory Visit: Payer: Medicare Other | Admitting: Physician Assistant

## 2022-10-26 ENCOUNTER — Inpatient Hospital Stay: Payer: Medicare Other | Attending: Internal Medicine

## 2022-10-26 ENCOUNTER — Ambulatory Visit (HOSPITAL_COMMUNITY)
Admission: RE | Admit: 2022-10-26 | Discharge: 2022-10-26 | Disposition: A | Discharge status: Home / Self Care | Payer: Medicare Other | Source: Ambulatory Visit | Attending: Internal Medicine | Admitting: Internal Medicine

## 2022-10-26 DIAGNOSIS — Z79899 Other long term (current) drug therapy: Secondary | ICD-10-CM | POA: Insufficient documentation

## 2022-10-26 DIAGNOSIS — J439 Emphysema, unspecified: Secondary | ICD-10-CM | POA: Diagnosis not present

## 2022-10-26 DIAGNOSIS — Z923 Personal history of irradiation: Secondary | ICD-10-CM | POA: Diagnosis not present

## 2022-10-26 DIAGNOSIS — F17210 Nicotine dependence, cigarettes, uncomplicated: Secondary | ICD-10-CM | POA: Diagnosis not present

## 2022-10-26 DIAGNOSIS — J90 Pleural effusion, not elsewhere classified: Secondary | ICD-10-CM | POA: Diagnosis not present

## 2022-10-26 DIAGNOSIS — Z86718 Personal history of other venous thrombosis and embolism: Secondary | ICD-10-CM | POA: Insufficient documentation

## 2022-10-26 DIAGNOSIS — Z7901 Long term (current) use of anticoagulants: Secondary | ICD-10-CM | POA: Diagnosis not present

## 2022-10-26 DIAGNOSIS — Z7401 Bed confinement status: Secondary | ICD-10-CM | POA: Diagnosis not present

## 2022-10-26 DIAGNOSIS — Z7952 Long term (current) use of systemic steroids: Secondary | ICD-10-CM | POA: Diagnosis not present

## 2022-10-26 DIAGNOSIS — C7951 Secondary malignant neoplasm of bone: Secondary | ICD-10-CM | POA: Diagnosis not present

## 2022-10-26 DIAGNOSIS — C3490 Malignant neoplasm of unspecified part of unspecified bronchus or lung: Secondary | ICD-10-CM | POA: Insufficient documentation

## 2022-10-26 DIAGNOSIS — G893 Neoplasm related pain (acute) (chronic): Secondary | ICD-10-CM | POA: Insufficient documentation

## 2022-10-26 DIAGNOSIS — N281 Cyst of kidney, acquired: Secondary | ICD-10-CM | POA: Diagnosis not present

## 2022-10-26 DIAGNOSIS — C3432 Malignant neoplasm of lower lobe, left bronchus or lung: Secondary | ICD-10-CM | POA: Diagnosis not present

## 2022-10-26 DIAGNOSIS — N200 Calculus of kidney: Secondary | ICD-10-CM | POA: Diagnosis not present

## 2022-10-26 DIAGNOSIS — J9811 Atelectasis: Secondary | ICD-10-CM | POA: Diagnosis not present

## 2022-10-26 LAB — CMP (CANCER CENTER ONLY)
ALT: 9 U/L (ref 0–44)
AST: 11 U/L — ABNORMAL LOW (ref 15–41)
Albumin: 2.2 g/dL — ABNORMAL LOW (ref 3.5–5.0)
Alkaline Phosphatase: 140 U/L — ABNORMAL HIGH (ref 38–126)
Anion gap: 4 — ABNORMAL LOW (ref 5–15)
BUN: 20 mg/dL (ref 8–23)
CO2: 26 mmol/L (ref 22–32)
Calcium: 8.1 mg/dL — ABNORMAL LOW (ref 8.9–10.3)
Chloride: 104 mmol/L (ref 98–111)
Creatinine: 0.95 mg/dL (ref 0.61–1.24)
GFR, Estimated: >60 mL/min (ref >60)
Glucose, Bld: 94 mg/dL (ref 70–99)
Potassium: 4.9 mmol/L (ref 3.5–5.1)
Sodium: 134 mmol/L — ABNORMAL LOW (ref 135–145)
Total Bilirubin: 0.2 mg/dL — ABNORMAL LOW (ref 0.3–1.2)
Total Protein: 4.8 g/dL — ABNORMAL LOW (ref 6.5–8.1)

## 2022-10-26 LAB — CBC WITH DIFFERENTIAL (CANCER CENTER ONLY)
Abs Immature Granulocytes: 0.07 10*3/uL (ref 0.00–0.07)
Basophils Absolute: 0 10*3/uL (ref 0.0–0.1)
Basophils Relative: 1 %
Eosinophils Absolute: 0.1 10*3/uL (ref 0.0–0.5)
Eosinophils Relative: 1 %
HCT: 31.4 % — ABNORMAL LOW (ref 39.0–52.0)
Hemoglobin: 10.4 g/dL — ABNORMAL LOW (ref 13.0–17.0)
Immature Granulocytes: 1 %
Lymphocytes Relative: 24 %
Lymphs Abs: 1.3 10*3/uL (ref 0.7–4.0)
MCH: 30.1 pg (ref 26.0–34.0)
MCHC: 33.1 g/dL (ref 30.0–36.0)
MCV: 91 fL (ref 80.0–100.0)
Monocytes Absolute: 0.6 10*3/uL (ref 0.1–1.0)
Monocytes Relative: 10 %
Neutro Abs: 3.6 10*3/uL (ref 1.7–7.7)
Neutrophils Relative %: 63 %
Platelet Count: 371 10*3/uL (ref 150–400)
RBC: 3.45 MIL/uL — ABNORMAL LOW (ref 4.22–5.81)
RDW: 18.9 % — ABNORMAL HIGH (ref 11.5–15.5)
WBC Count: 5.7 10*3/uL (ref 4.0–10.5)
nRBC: 0 % (ref 0.0–0.2)

## 2022-10-26 MED ORDER — IOHEXOL 300 MG/ML  SOLN
100.0000 mL | Freq: Once | INTRAMUSCULAR | Status: AC | PRN
  Administered 2022-10-26: 100 mL via INTRAVENOUS

## 2022-10-26 MED ORDER — SODIUM CHLORIDE (PF) 0.9 % IJ SOLN
INTRAMUSCULAR | Status: AC
  Filled 2022-10-26: qty 50

## 2022-10-26 NOTE — Progress Notes (Unsigned)
Expand All Collapse All  [facility] HEMATOLOGY-ONCOLOGY TeleHEALTH VISIT PROGRESS NOTE    I connected with [name] on 10/28/22 at  3 PM EST by telephone and verified that I am speaking with the correct person using two identifiers.  I discussed the limitations, risks, security and privacy concerns of performing an evaluation and management service by telemedicine and the availability of in-person appointments. I also discussed with the patient that there may be a patient responsible charge related to this service. The patient expressed understanding and agreed to proceed.  Other persons participating in the visit and their role in the encounter: Linkyn Gobin  Patient's location: Home   Provider's location: Office    Default, Provider, MD No address on file     Henrine Screws, MD [address] [place] [address]  DIAGNOSIS:  Stage IV (T3, N0, M1c) non-small cell lung cancer, adenocarcinoma presented with cavitary left lower lobe lung mass in addition to other pulmonary nodules and scattered hypermetabolic osseous metastatic disease involving the spine, ribs and bony pelvis as well as left proximal humeral pathologic fracture.    Molecular Biomarkers performed by [facility] [employer] DETECTED [employer]TERATION(S) / [name])      [id number]  Afatinib, Dacomitinib, Erlotinib, Gefitinib, Osimertinib, Ramucirumab Neratinib   FGFR1Amplification  Erdafitinib, Lenvatinib, Nintedanib, Pazopanib, Pemigatinib, Ponatinib   [id number] None   Repeated Moleculars by [facility]: Positive for MET Amplification    PRIOR THERAPY: 1) Palliative radiotherapy to the the left humerus under the care of Dr. Roselind Messier. Last treatment ~04/24/2019.  2) Palliative radiation to the Right Pelvis/Ilium under the care of Dr. Roselind Messier. Last dose 05/19/22.  CURRENT THERAPY:  1) Targeted therapy with Tagrisso 80 p.o. daily.  He started the first dose on April 21, 2019. Tepmetko 450 mg p.o.  daily starting on 08/24/22. Due to tolerability, he started taking this every other day starting 10/14/22 2) Rivka Barbara every 4 weeks, last dose 04/27/22.  INTERVAL HISTORY: Albert Perez 72 y.o. male and I connected via telephone call today.  The patient has a complicated history.  The patient was diagnosed with stage IV non-small cell lung cancer in 2020.  He had been on targeted treatment with Tagrisso 80 mg p.o. daily for several years and tolerating well overall with reasonable quality of life and performance status.  However, he had a significant/abrupt decline in his health with his performance status and mobility around December 2023.  He had increased pain, increased lower extremity swelling, and decreased mobility.  Restaging imaging showed interval progression of disease with several hypermetabolic bony metastases in the lower thoracic spine, pelvis, and sacrum.   He had repeat molecular studies that showed development of met amplification.  Therefore, Dr. Arbutus Ped recommended starting treatment with Tepmetko in conjunction with his Tagrisso.   Due to some hesitation on the patient's part with the side effect profile, there was some delay in starting his treatment with Tepmetko.  During that time, the patient had essentially become confined to bed due to being able to bear weight or sit due to the metastatic bone disease. The patient has not physically been seen in the clinic since January 2024 due to not being able to physically come to appointments.  The patient has a hospital bed in his living room.  He eventually did start treatment with Tepmetko at the end of February 2024.  He has been on this for approximately 2 months.  He was tolerating it well overall for the first  month and a half but more recently has been endorsing decreased appetite, stomach cramping, and fatigue.   He also was recently seen in the emergency room due to lower extremity wounds.  He recently completed antibiotic.  He also  has home health nurse come for wound care.  He is scheduled to see a wound care center on 11/16/2022. He also previously was endorsing lower extremity swelling which improved in February and has since not reoccurred .  He sees [facility]  The patient's wife has been trying to get him to consider inpatient rehab.  He declines.  The patient cannot move his right leg secondary to weakness.   The patient drinks 1 protein supplemental drinks per day.  The patient is followed closely by palliative care for which he takes Percocet for cancer-related pain. Otherwise, he denies any fever, chills, or night sweats.  He denies any respiratory complaints. He denies shortness of breath, cough, chest pain, or hemoptysis.  He states his breathing is "wonderful".  Denies any nausea or vomiting.  He denies any headache or visual changes.  He intermittently has diarrhea or constipation.  The patient recently had a restaging CT scan.  The purpose of this visit today is to review his scan results and for a more detail discussion about his current condition and recommended treatment options.  MEDICAL HISTORY: Past Medical History:  Diagnosis Date   A-fib    PMH: 2000-2001   Arthritis    Cancer    melanoma 2018   Diarrhea    due to medications   DVT (deep venous thrombosis)    Left brachial vein DVT (01/30/19, following fracture)   Dysrhythmia    afib -    ETOH abuse    GERD (gastroesophageal reflux disease)    ocassional   History of radiation therapy    Right Hip 02/08/22-02/19/22-Dr. Antony Blackbird   History of radiation therapy    Right Pelvis-05/06/22-05/19/22- Dr. Antony Blackbird   Lung cancer dx'd 02/26/2019   Metastatic cancer to bone dx'd 02/26/2019   Sleep apnea    do not wear CPAP   Stroke 2019   " mini"   Vertigo    Wears glasses     ALLERGIES:  has No Known Allergies.  MEDICATIONS:  Current Outpatient Medications  Medication Sig Dispense Refill   apixaban (ELIQUIS) 5 MG TABS  tablet Take 1 tablet (5 mg total) by mouth 2 (two) times daily. 60 tablet 2   bacitracin ointment Apply 1 Application topically 2 (two) times daily. 120 g 0   buPROPion (WELLBUTRIN XL) 150 MG 24 hr tablet Take 150 mg by mouth daily as needed (nicotine cravings).     carvedilol (COREG) 6.25 MG tablet TAKE 1 TABLET BY MOUTH TWICE A DAY 180 tablet 3   Cholecalciferol (VITAMIN D-3) 25 MCG (1000 UT) CAPS Take 1,000 Units by mouth daily.     dexamethasone (DECADRON) 4 MG tablet Take 1 tablet (4 mg total) by mouth 2 (two) times daily. 30 tablet 0   furosemide (LASIX) 20 MG tablet Take 20 mg by mouth daily.     gabapentin (NEURONTIN) 300 MG capsule Take 1 capsule (300 mg total) by mouth 2 (two) times daily. 60 capsule 0   loperamide (IMODIUM) 2 MG capsule Take 2 mg by mouth as needed for diarrhea or loose stools.     Magnesium 500 MG CAPS Take 1,000 mg by mouth daily.     Multiple Vitamin (MULTIVITAMIN PO) Take 1 tablet by  mouth daily. Centrum     osimertinib mesylate (TAGRISSO) 80 MG tablet Take 1 tablet (80 mg total) by mouth daily. 60 tablet 3   OVER THE COUNTER MEDICATION Take 1 tablet by mouth daily. Standard Process, LivaPlex (Liver supplement)     oxyCODONE ER (XTAMPZA ER) 18 MG C12A Take 1 capsule by mouth every 8 (eight) hours. 60 capsule 0   oxyCODONE-acetaminophen (PERCOCET) 7.5-325 MG tablet Take 1-2 tablets by mouth every 6 (six) hours as needed for severe pain. 90 tablet 0   potassium chloride (KLOR-CON) 10 MEQ tablet Take 1 tablet (10 mEq total) by mouth daily. 90 tablet 1   rosuvastatin (CRESTOR) 5 MG tablet Take 5 mg by mouth daily.  2   sulfamethoxazole-trimethoprim (BACTRIM DS) 800-160 MG tablet Take 1 tablet by mouth 2 (two) times daily for 7 days. 14 tablet 0   tepotinib hcl (TEPMETKO) 225 MG tablet Take 2 tablets (450 mg total) by mouth daily. Take with food. 60 tablet 3   torsemide (DEMADEX) 20 MG tablet TAKE 1 TABLET BY MOUTH TWICE A DAY 180 tablet 1   Triamcinolone Acetonide  (TRIAMCINOLONE 0.1 % CREAM : EUCERIN) CREA Apply 1 Application topically 2 (two) times daily. 1 each 0   TURMERIC PO Take 1 capsule by mouth daily.     vitamin B-12 (CYANOCOBALAMIN) 1000 MCG tablet Take 1,000 mcg by mouth daily.     No current facility-administered medications for this visit.    SURGICAL HISTORY:  Past Surgical History:  Procedure Laterality Date   ANKLE FRACTURE SURGERY Right 2003   COLONOSCOPY  2018   ELBOW SURGERY Left 1964   INGUINAL HERNIA REPAIR Bilateral 11/12/2020   Procedure: BILATERAL OPEN INGUINAL HERNIA REPAIR WITH MESH;  Surgeon: Abigail Miyamoto, MD;  Location: [facility] OR;  Service: General;  Laterality: Bilateral;   melanoma removal  04/2017   Lt shoulder   RETINAL DETACHMENT SURGERY Right 1971   TONSILLECTOMY     VIDEO BRONCHOSCOPY WITH ENDOBRONCHIAL NAVIGATION N/A 03/28/2019   Procedure: VIDEO BRONCHOSCOPY WITH ENDOBRONCHIAL NAVIGATION and Biopsies;  Surgeon: Leslye Peer, MD;  Location: [facility] OR;  Service: Thoracic;  Laterality: N/A;    REVIEW OF SYSTEMS:   Constitutional: Positive for fatigue and poor appetite. Negative for chills, fever and unexpected weight change.  HENT: Negative for mouth sores, nosebleeds, sore throat and trouble swallowing.   Eyes: Negative for eye problems and icterus.  Respiratory: Negative for cough, hemoptysis, shortness of breath and wheezing.   Cardiovascular: Negative for chest pain and leg swelling (improved).  Gastrointestinal: Positive for intermittent diarrhea and/or constipation.  Negative for abdominal pain, nausea and vomiting.  Genitourinary:  Negative for bladder incontinence, difficulty urinating, dysuria, frequency and hematuria.   Musculoskeletal: Positive for bone pain and shoulder and pelvis.  Negative for gait problem, neck pain and neck stiffness.  Skin: Negative for itching and rash.  Neurological: Positive for gait problems.  Negative for dizziness, headaches, light-headedness and seizures.  Hematological:  Negative for adenopathy. Does not bruise/bleed easily.  Psychiatric/Behavioral: Negative for confusion, depression and sleep disturbance. The patient is not nervous/anxious.    PHYSICAL EXAMINATION:  There were no vitals taken for this visit.  ECOG PERFORMANCE STATUS: 4  Physical Exam  Constitutional: Oriented to person, place, and time  Psychiatric: Mood, memory and judgment normal.  Vitals reviewed.  LABORATORY DATA: Lab Results  Component Value Date   WBC 5.7 10/26/2022   HGB 10.4 (L) 10/26/2022   HCT 31.4 (L) 10/26/2022   MCV 91.0 10/26/2022  PLT 371 10/26/2022      Chemistry      Component Value Date/Time   NA 134 (L) 10/26/2022 1226   NA 138 06/10/2022 1006   K 4.9 10/26/2022 1226   CL 104 10/26/2022 1226   CO2 26 10/26/2022 1226   BUN 20 10/26/2022 1226   BUN 17 06/10/2022 1006   CREATININE 0.95 10/26/2022 1226      Component Value Date/Time   CALCIUM 8.1 (L) 10/26/2022 1226   ALKPHOS 140 (H) 10/26/2022 1226   AST 11 (L) 10/26/2022 1226   ALT 9 10/26/2022 1226   BILITOT 0.2 (L) 10/26/2022 1226       RADIOGRAPHIC STUDIES:  DG Tibia/Fibula Right  Result Date: 10/19/2022 CLINICAL DATA:  foot wound eval for deep infection EXAM: RIGHT TIBIA AND FIBULA - 2 VIEW COMPARISON:  None Available. FINDINGS: Plate and screw fixation of distal fibular shaft. 2 orthopedic screws across the medial malleolus. No fracture or dislocation. Small calcaneal spur. 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 Regional soft tissues unremarkable. IMPRESSION: Negative. Electronically Signed   By: Corlis Leak M.D.   On: 10/19/2022 20:40   DG Foot 2 Views Right  Result Date: 10/19/2022 CLINICAL DATA:  [id number] Infection [id number] EXAM: RIGHT FOOT - 2 VIEW COMPARISON:  None Available. FINDINGS: Solid-appearing plate and screw fixation of distal fibular shaft. 2 orthopedic screws across the medial malleolus. No acute fracture. Small calcaneal spur.  IMPRESSION: 1. No acute findings. 2. ORIF of distal fibular and medial malleolar fractures. Electronically Signed   By: Corlis Leak M.D.   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 Diagnosed in September 2020.    He is status post palliative radiotherapy to the metastatic disease in the left arm under the care of Dr. Roselind Messier. This was completed in October 2020.   The patient is currently undergoing treatment with target therapy with Tagrisso 80 mg p.o. daily started in October 2020.   he had palliative radiation to the right hip due to a new lesion under the care of Dr. Roselind Messier and this was completed on 05/19/2022.  He is undergoing monthly Xgeva due to his metastatic bone lesions. His next dose is overdue. Last given in October 2023.  Unfortunately, the patient is currently bedridden and unable to make it into the clinic.   Te patient had a repeat PET scan performed in January 2023 which showed disease progression.  The patient had repeat molecular studies that showed he developed a MET amplification.  Therefore, Dr. Arbutus Ped recommended in addition to his Tagrisso, taking Tepmetko 450 milligrams daily.  The patient was hesitant to start this due to concerns about the side effect profile. He eventually started this on 08/24/22. He had been tolerating this well but recently started endorsing increased fatigue, abdominal cramping, and appetite change.  Therefore he started taking it every other day about a week and a half ago.  The patient had a restaging CT scan to assess his condition.  Dr. Arbutus Ped personally  independently reviewed the scan and discussed the results with the patient today.  The scan showed no evidence  of disease progression.  It appears that the multiple metastatic bony lesions are healing.  Dr. Arbutus Ped recommends the patient continue on the same treatment at the same dose.  He will continue taking Tepmetko every other day for tolerability  He was encouraged to increase his protein intake per review of his labs from earlier this week.  We would encourage the patient to reconsider rehab.  We will check with other home health agencies to offer physical therapy as [facility] home [facility] does not.   The patient's scan incidentally noted some bladder wall thickening.  They will asked her home health nurse if she can bring a urinalysis cup to assess for urinary tract infection at his next visit.  We have refilled his Eliquis and Tagrisso.  The patient was advised to call immediately if he has any concerning symptoms in the interval. The patient voices understanding of current disease status and treatment options and is in agreement with the current care plan. All questions were answered. The patient knows to call the clinic with any problems, questions or concerns. We can certainly see the patient much sooner if necessary   No orders of the defined types were placed in this encounter.    Avian Konigsberg L Asaph Serena, PA-C 10/26/22  ADDENDUM: Hematology/Oncology Attending:  I joined the telephone visit with the patient and his wife today.  I reviewed his record, lab, scan and recommended his care plan.  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  The patient has been on treatment with targeted therapy with Tagrisso 80 mg p.o. daily started April 21, 2019.  He had evidence  for disease progression early in 2024 and molecular studies showed additional resistant mutation with MET amplification.  The patient continued his treatment with Tagrisso and we added Tepotinib (Tepmetko) 400 mg p.o. daily initially but the patient started taking it every other day because of intolerance issue.  He also has been on treatment with Xgeva for the bone disease.  The patient had repeat CT scan of the chest, abdomen and pelvis performed recently.  I personally and independently reviewed the scan images and discussed the results with the patient and his wife. His scan showed a stable disease with some healing process in the bone. Unfortunately the patient is still bedbound and does not move much and he has a lot of wounds that require wound care. I recommended for him to continue his current treatment with Tagrisso and Tepotinib (Tepmetko) at the current regimen. He may benefit from physical therapy at some point. We will see him back for follow-up visit in 1 months for evaluation and repeat blood work. The patient was advised to call immediately if he has any other concerning symptoms in the interval.

## 2022-10-27 ENCOUNTER — Other Ambulatory Visit: Payer: Self-pay | Admitting: Physician Assistant

## 2022-10-27 ENCOUNTER — Inpatient Hospital Stay (HOSPITAL_BASED_OUTPATIENT_CLINIC_OR_DEPARTMENT_OTHER): Payer: Medicare Other | Admitting: Nurse Practitioner

## 2022-10-27 ENCOUNTER — Encounter: Payer: Self-pay | Admitting: Nurse Practitioner

## 2022-10-27 ENCOUNTER — Other Ambulatory Visit: Payer: Self-pay

## 2022-10-27 ENCOUNTER — Other Ambulatory Visit: Payer: Self-pay | Admitting: Pharmacist

## 2022-10-27 ENCOUNTER — Other Ambulatory Visit (HOSPITAL_COMMUNITY): Payer: Self-pay

## 2022-10-27 DIAGNOSIS — Z515 Encounter for palliative care: Secondary | ICD-10-CM | POA: Diagnosis not present

## 2022-10-27 DIAGNOSIS — C3490 Malignant neoplasm of unspecified part of unspecified bronchus or lung: Secondary | ICD-10-CM | POA: Diagnosis not present

## 2022-10-27 DIAGNOSIS — R530 Neoplastic (malignant) related fatigue: Secondary | ICD-10-CM

## 2022-10-27 DIAGNOSIS — C7951 Secondary malignant neoplasm of bone: Secondary | ICD-10-CM

## 2022-10-27 DIAGNOSIS — C3492 Malignant neoplasm of unspecified part of left bronchus or lung: Secondary | ICD-10-CM

## 2022-10-27 DIAGNOSIS — K5903 Drug induced constipation: Secondary | ICD-10-CM

## 2022-10-27 DIAGNOSIS — I2692 Saddle embolus of pulmonary artery without acute cor pulmonale: Secondary | ICD-10-CM

## 2022-10-27 DIAGNOSIS — G893 Neoplasm related pain (acute) (chronic): Secondary | ICD-10-CM

## 2022-10-27 MED ORDER — OSIMERTINIB MESYLATE 80 MG PO TABS
80.0000 mg | ORAL_TABLET | Freq: Every day | ORAL | 3 refills | Status: DC
Start: 2022-10-27 — End: 2022-10-27

## 2022-10-27 MED ORDER — APIXABAN 5 MG PO TABS
5.0000 mg | ORAL_TABLET | Freq: Two times a day (BID) | ORAL | 2 refills | Status: DC
Start: 2022-10-27 — End: 2022-11-30

## 2022-10-27 MED ORDER — OXYCODONE-ACETAMINOPHEN 7.5-325 MG PO TABS
1.0000 | ORAL_TABLET | Freq: Four times a day (QID) | ORAL | 0 refills | Status: DC | PRN
Start: 2022-10-27 — End: 2022-12-01

## 2022-10-27 MED ORDER — OSIMERTINIB MESYLATE 80 MG PO TABS
80.0000 mg | ORAL_TABLET | Freq: Every day | ORAL | 3 refills | Status: DC
Start: 2022-10-27 — End: 2023-01-12
  Filled 2022-10-27: qty 30, 30d supply, fill #0
  Filled 2022-12-06 – 2022-12-09 (×2): qty 30, 30d supply, fill #1
  Filled 2022-12-29: qty 30, 30d supply, fill #2

## 2022-10-27 MED ORDER — OSIMERTINIB MESYLATE 80 MG PO TABS
80.0000 mg | ORAL_TABLET | Freq: Every day | ORAL | 3 refills | Status: DC
Start: 2022-10-27 — End: 2022-10-27
  Filled 2022-10-27: qty 60, 60d supply, fill #0

## 2022-10-27 MED ORDER — APIXABAN 5 MG PO TABS
5.0000 mg | ORAL_TABLET | Freq: Two times a day (BID) | ORAL | 2 refills | Status: DC
Start: 2022-10-27 — End: 2022-10-27
  Filled 2022-10-27: qty 60, 30d supply, fill #0

## 2022-10-27 NOTE — Progress Notes (Signed)
Palliative Medicine [facility]  [phone] [phone]   Name: Albert Perez Date: 10/27/2022 MRN: [id number]  DOB: 1950/11/22  Patient Care Team: Albert Screws, MD as PCP - General (Family Medicine) Albert Lemming, MD as PCP - Electrophysiology (Cardiology) Albert Gess, MD as PCP - Cardiology (Cardiology) Albert Darner, MD as Consulting Physician (Orthopedic Surgery) Albert Perez, Albert Baumgartner, NP as Nurse Practitioner (Nurse Practitioner)    I connected with Albert Perez on 08/09/22 at  9:00 AM EDT by video and verified that I am speaking with the correct person using two identifiers.   I discussed the limitations, risks, security and privacy concerns of performing an evaluation and management service by telemedicine and the availability of in-person appointments. I also discussed with the patient that there may be a patient responsible charge related to this service. The patient expressed understanding and agreed to proceed.   Other persons participating in the visit and their role in the encounter: Albert Perez, wife   Patient's location: home  Provider's location: [facility]   Chief Complaint: follow up of symptom management   INTERVAL HISTORY: Albert Perez is a 72 y.o. 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 Recent scans  Palliative ask to see for.  Palliative ask to see for symptom management.  SOCIAL HISTORY:     reports that he has been smoking cigarettes. He has a 7.50 pack-year smoking history. He has never used smokeless tobacco. He reports current alcohol use of about 2.0 standard drinks of alcohol per week. He reports current drug use. Drug: Marijuana.  ADVANCE DIRECTIVES:    CODE  STATUS:   PAST MEDICAL HISTORY: Past Medical History:  Diagnosis Date   A-fib    PMH: 2000-2001   Arthritis    Cancer    melanoma 2018   Diarrhea    due to medications   DVT (deep venous thrombosis)    Left brachial vein DVT (01/30/19, following fracture)   Dysrhythmia    afib -    ETOH abuse    GERD (gastroesophageal reflux disease)    ocassional   History of radiation therapy    Right Hip 02/08/22-02/19/22-Dr. Antony Blackbird   History of radiation therapy    Right Pelvis-05/06/22-05/19/22- Dr. Antony Blackbird   Lung cancer dx'd 02/26/2019   Metastatic cancer to bone dx'd 02/26/2019   Sleep apnea    do not wear CPAP   Stroke 2019   " mini"   Vertigo    Wears glasses     ALLERGIES:  has No Known Allergies.  MEDICATIONS:  Current Outpatient Medications  Medication Sig Dispense Refill   apixaban (ELIQUIS) 5 MG TABS tablet Take 1 tablet (5 mg total) by mouth 2 (two) times daily. 60 tablet 2   bacitracin ointment Apply 1 Application topically 2 (two) times daily. 120 g 0   buPROPion (WELLBUTRIN XL) 150 MG 24 hr tablet Take 150 mg by mouth daily as needed (nicotine cravings).     carvedilol (COREG) 6.25 MG tablet TAKE 1 TABLET BY MOUTH TWICE A DAY 180 tablet 3   Cholecalciferol (VITAMIN D-3) 25 MCG (1000 UT) CAPS Take 1,000 Units by mouth daily.     dexamethasone (DECADRON) 4 MG tablet Take 1 tablet (4 mg total) by mouth 2 (two) times daily. 30 tablet 0  furosemide (LASIX) 20 MG tablet Take 20 mg by mouth daily.     loperamide (IMODIUM) 2 MG capsule Take 2 mg by mouth as needed for diarrhea or loose stools.     Magnesium 500 MG CAPS Take 1,000 mg by mouth daily.     Multiple Vitamin (MULTIVITAMIN PO) Take 1 tablet by mouth daily. Centrum     osimertinib mesylate (TAGRISSO) 80 MG tablet Take 1 tablet (80 mg total) by mouth daily. 60 tablet 3   OVER THE COUNTER MEDICATION Take 1 tablet by mouth daily. Standard Process, LivaPlex (Liver supplement)     oxyCODONE-acetaminophen  (PERCOCET) 7.5-325 MG tablet Take 1-2 tablets by mouth every 6 (six) hours as needed for severe pain. 90 tablet 0   potassium chloride (KLOR-CON) 10 MEQ tablet Take 1 tablet (10 mEq total) by mouth daily. 90 tablet 1   rosuvastatin (CRESTOR) 5 MG tablet Take 5 mg by mouth daily.  2   tepotinib hcl (TEPMETKO) 225 MG tablet Take 2 tablets (450 mg total) by mouth daily. Take with food. 60 tablet 3   torsemide (DEMADEX) 20 MG tablet TAKE 1 TABLET BY MOUTH TWICE A DAY 180 tablet 1   Triamcinolone Acetonide (TRIAMCINOLONE 0.1 % CREAM : EUCERIN) CREA Apply 1 Application topically 2 (two) times daily. 1 each 0   TURMERIC PO Take 1 capsule by mouth daily.     vitamin B-12 (CYANOCOBALAMIN) 1000 MCG tablet Take 1,000 mcg by mouth daily.     No current facility-administered medications for this visit.    VITAL SIGNS: There were no vitals taken for this visit. There were no vitals filed for this visit.   Estimated body mass index is 22.38 kg/m as calculated from the following:   Height as of 10/19/22: 5' 7.5" (1.715 m).   Weight as of 10/19/22: 145 lb (65.8 kg).   PERFORMANCE STATUS (ECOG) : 2 - Symptomatic, <50% confined to bed  IMPRESSION: I connected by phone with patient's wife. She denies any acute distress on behalf of [name]. He is currently resting. Shares he continues to mainly be bed bound, however she and family will get him up on side of bed or in chair several times during the week. Mrs. Deardorff reports the wound care nurse comes to the house weekly in addition to M,W, F home health aid. He is anxiously awaiting recent scan results.  [name] had a recent ED visit due to infected leg ulcerations. He was discharged home on antibiotics. Some improvement per wife.   Albert Perez, shares patient had a great weekend. He received a lifetime Achievement Award that was presented to him in the home on Saturday. She shares he had a large crowd of visitors for the presentation and was able to interact the  entire day however this did tire him out.   Denies nausea, vomiting, constipation, or diarrhea. Is taking medications as prescribed. Some days are better than others. Reports pain is controlled on current regimen. He is taking Oxycodone 2-3 times daily.   All questions answered and support provided. Wife knows to contact office as needed.   PLAN: Oxycodone 7.5 mg every 6-8 hours as needed for breakthrough pain Xtampza successfully weaned off.  Miralax daily for bowel regimen  We will continue to closely monitor and support as needed. I will plan to see patient back in 3-4 weeks in collaboration with his other oncology/radiation appointments. Patient and wife knows to contact our office sooner if needed.   Mrs. Satterfield expressed understanding  and was in agreement with this plan. He also understands that He can call the clinic at any time with any questions, concerns, or complaints.     Any controlled substances utilized were prescribed in the context of palliative care. PDMP has been reviewed.    Visit consisted of counseling and education dealing with the complex and emotionally intense issues of symptom management and palliative care in the setting of serious and potentially life-threatening illness.Greater than 50%  of this time was spent counseling and coordinating care related to the above assessment and plan.  Willette Alma, AGPCNP-BC  Palliative Medicine Team/[facility]  *Please note that this is a verbal dictation therefore any spelling or grammatical errors are due to the "Dragon Medical One" system interpretation.

## 2022-10-27 NOTE — Progress Notes (Signed)
Oral Oncology Pharmacist Encounter   Patient  fills Tagrisso through [facility]. [facility] to [facility] so medication can be set up to be shipped to patient.   Lenord Carbo, PharmD, BCPS, [facility] Hematology/Oncology Clinical Pharmacist [facility] Oral Chemotherapy Navigation Clinics [phone] 10/27/2022 3:58 PM

## 2022-10-28 ENCOUNTER — Inpatient Hospital Stay (HOSPITAL_BASED_OUTPATIENT_CLINIC_OR_DEPARTMENT_OTHER): Payer: Medicare Other | Admitting: Physician Assistant

## 2022-10-28 ENCOUNTER — Telehealth: Payer: Self-pay | Admitting: Medical Oncology

## 2022-10-28 ENCOUNTER — Other Ambulatory Visit: Payer: Self-pay

## 2022-10-28 ENCOUNTER — Inpatient Hospital Stay: Payer: Medicare Other

## 2022-10-28 DIAGNOSIS — Z7189 Other specified counseling: Secondary | ICD-10-CM | POA: Diagnosis not present

## 2022-10-28 DIAGNOSIS — C3492 Malignant neoplasm of unspecified part of left bronchus or lung: Secondary | ICD-10-CM

## 2022-10-28 NOTE — Telephone Encounter (Signed)
Home PT - I asked Destiny @ [facility] if they can provide home PT to " evaluate and treat" Albert Perez.   She needs to look at pts insurance card  before a decision can be made. I emailed copies of [employer] cards to her.She received them and will get back with Cassie .

## 2022-10-29 ENCOUNTER — Telehealth: Payer: Self-pay

## 2022-10-29 ENCOUNTER — Other Ambulatory Visit: Payer: Self-pay | Admitting: Physician Assistant

## 2022-10-29 ENCOUNTER — Other Ambulatory Visit (HOSPITAL_COMMUNITY): Payer: Self-pay

## 2022-10-29 DIAGNOSIS — C3492 Malignant neoplasm of unspecified part of left bronchus or lung: Secondary | ICD-10-CM

## 2022-10-29 DIAGNOSIS — G893 Neoplasm related pain (acute) (chronic): Secondary | ICD-10-CM

## 2022-10-29 DIAGNOSIS — R29898 Other symptoms and signs involving the musculoskeletal system: Secondary | ICD-10-CM

## 2022-10-29 NOTE — Telephone Encounter (Signed)
This nurse received a message from Destiny at [facility] stating this patients insurance does cover in home physical therapy and she just needs to receive a referral.  This nurse faxed patient referral and patient demographics to [facility] health  [phone].  No further concerns or questions at this time.

## 2022-11-01 ENCOUNTER — Other Ambulatory Visit: Payer: Self-pay | Admitting: Medical Oncology

## 2022-11-01 NOTE — Telephone Encounter (Addendum)
[facility] Pt already has 2 refills plus the rx sent in last wed. I told [name] she needs to [facility] for the next 3 refills.

## 2022-11-01 NOTE — Telephone Encounter (Signed)
Wife notified [name] refilled.

## 2022-11-02 ENCOUNTER — Telehealth: Payer: Self-pay | Admitting: Internal Medicine

## 2022-11-02 DIAGNOSIS — M6281 Muscle weakness (generalized): Secondary | ICD-10-CM | POA: Diagnosis not present

## 2022-11-02 NOTE — Telephone Encounter (Signed)
Scheduled per 04/25 los, patient has been called and voicemail was left. 

## 2022-11-05 ENCOUNTER — Other Ambulatory Visit (HOSPITAL_COMMUNITY): Payer: Self-pay

## 2022-11-05 DIAGNOSIS — M6281 Muscle weakness (generalized): Secondary | ICD-10-CM | POA: Diagnosis not present

## 2022-11-06 DIAGNOSIS — M6281 Muscle weakness (generalized): Secondary | ICD-10-CM | POA: Diagnosis not present

## 2022-11-07 DIAGNOSIS — C7951 Secondary malignant neoplasm of bone: Secondary | ICD-10-CM | POA: Diagnosis not present

## 2022-11-09 DIAGNOSIS — M6281 Muscle weakness (generalized): Secondary | ICD-10-CM | POA: Diagnosis not present

## 2022-11-12 DIAGNOSIS — M6281 Muscle weakness (generalized): Secondary | ICD-10-CM | POA: Diagnosis not present

## 2022-11-16 ENCOUNTER — Other Ambulatory Visit: Payer: Self-pay | Admitting: Cardiovascular Disease

## 2022-11-16 ENCOUNTER — Encounter (HOSPITAL_BASED_OUTPATIENT_CLINIC_OR_DEPARTMENT_OTHER): Payer: Medicare Other | Attending: General Surgery | Admitting: General Surgery

## 2022-11-16 DIAGNOSIS — Z85118 Personal history of other malignant neoplasm of bronchus and lung: Secondary | ICD-10-CM | POA: Insufficient documentation

## 2022-11-16 DIAGNOSIS — Z9221 Personal history of antineoplastic chemotherapy: Secondary | ICD-10-CM | POA: Insufficient documentation

## 2022-11-16 DIAGNOSIS — F17200 Nicotine dependence, unspecified, uncomplicated: Secondary | ICD-10-CM | POA: Insufficient documentation

## 2022-11-16 DIAGNOSIS — L89893 Pressure ulcer of other site, stage 3: Secondary | ICD-10-CM | POA: Insufficient documentation

## 2022-11-16 DIAGNOSIS — I10 Essential (primary) hypertension: Secondary | ICD-10-CM | POA: Diagnosis not present

## 2022-11-16 DIAGNOSIS — I4891 Unspecified atrial fibrillation: Secondary | ICD-10-CM | POA: Diagnosis not present

## 2022-11-16 DIAGNOSIS — G4730 Sleep apnea, unspecified: Secondary | ICD-10-CM | POA: Diagnosis not present

## 2022-11-16 DIAGNOSIS — Z08 Encounter for follow-up examination after completed treatment for malignant neoplasm: Secondary | ICD-10-CM | POA: Diagnosis not present

## 2022-11-16 DIAGNOSIS — L89513 Pressure ulcer of right ankle, stage 3: Secondary | ICD-10-CM | POA: Insufficient documentation

## 2022-11-16 DIAGNOSIS — C3492 Malignant neoplasm of unspecified part of left bronchus or lung: Secondary | ICD-10-CM | POA: Insufficient documentation

## 2022-11-16 DIAGNOSIS — Z09 Encounter for follow-up examination after completed treatment for conditions other than malignant neoplasm: Secondary | ICD-10-CM | POA: Insufficient documentation

## 2022-11-16 DIAGNOSIS — Z8673 Personal history of transient ischemic attack (TIA), and cerebral infarction without residual deficits: Secondary | ICD-10-CM | POA: Insufficient documentation

## 2022-11-16 DIAGNOSIS — C7951 Secondary malignant neoplasm of bone: Secondary | ICD-10-CM | POA: Diagnosis not present

## 2022-11-16 DIAGNOSIS — G893 Neoplasm related pain (acute) (chronic): Secondary | ICD-10-CM | POA: Diagnosis not present

## 2022-11-16 DIAGNOSIS — M1990 Unspecified osteoarthritis, unspecified site: Secondary | ICD-10-CM | POA: Insufficient documentation

## 2022-11-16 DIAGNOSIS — K219 Gastro-esophageal reflux disease without esophagitis: Secondary | ICD-10-CM | POA: Diagnosis not present

## 2022-11-16 DIAGNOSIS — S81801A Unspecified open wound, right lower leg, initial encounter: Secondary | ICD-10-CM | POA: Diagnosis not present

## 2022-11-16 NOTE — Progress Notes (Signed)
Potenza, Seng Perez ([id number]) [phone] [id number] Page 1 of 4 Visit Report for 11/16/2022 Abuse Risk Screen Details Patient Name: Date of Service: Albert Perez, Albert Perez 11/16/2022 1:30 PM Medical Record Number: [id number] Patient Account Number: [id number] Date of Birth/Sex: Treating RN: Aug 07, 1950 (72 y.o. Marlan Palau Primary Care Clemens Lachman: Henrine Screws Other Clinician: Referring Macey Wurtz: Treating Alondra Vandeven/Extender: Gerald Leitz in Treatment: 0 Abuse Risk Screen Items Answer ABUSE RISK SCREEN: Has anyone close to you tried to hurt or harm you recentlyo No Do you feel uncomfortable with anyone in your familyo No Has anyone forced you do things that you didnt want to doo No Electronic Signature(s) Signed: 11/16/2022 3:35:10 PM By: Samuella Bruin Entered By: Samuella Bruin on 11/16/2022 13:51:00 -------------------------------------------------------------------------------- Activities of Daily Living Details Patient Name: Date of Service: Albert Perez, Albert Perez 11/16/2022 1:30 PM Medical Record Number: [id number] Patient Account Number: [id number] Date of Birth/Sex: Treating RN: March 28, 1951 (72 y.o. Marlan Palau Primary Care Mirna Sutcliffe: Henrine Screws Other Clinician: Referring Skylar Flynt: Treating Idamae Coccia/Extender: Gerald Leitz in Treatment: 0 Activities of Daily Living Items Answer Activities of Daily Living (Please select one for each item) Drive Automobile Not Able T Medications ake Need Assistance Use T elephone Need Assistance Care for Appearance Not Able Use T oilet Need Assistance Bath / Shower Not Able Dress Self Not Able Feed Self Need Assistance Walk Not Able Get In / Out Bed Not Able Housework Not Able Prepare Meals Not Able Handle Money Not Able Shop for Self Not Able Electronic Signature(s) Signed: 11/16/2022 3:35:10 PM By: Samuella Bruin Entered By: Samuella Bruin on 11/16/2022 13:51:28 -------------------------------------------------------------------------------- Education Screening Details Patient Name: Date of Service: Albert Perez, Albert Perez. 11/16/2022 1:30 PM Medical Record Number: [id number] Patient Account Number: [id number] Date of Birth/Sex: Treating RN: [birth year] (72 y.o. Marlan Palau Primary Care Cletus Mehlhoff: Henrine Screws Other Clinician: Referring Tawania Daponte: Treating Nicosha Struve/Extender: Gerald Leitz in Treatment: 0 Albert Perez, Albert Perez ([id number]) [id number] Page 2 of 4 Primary Learner Assessed: Patient Learning Preferences/Education Level/Primary Language Learning Preference: Explanation, Demonstration, Video, Printed Material Highest Education Level: College or Above Preferred Language: Economist Language Barrier: No Translator Needed: No Memory Deficit: No Emotional Barrier: No Cultural/Religious Beliefs Affecting Medical Care: No Physical Barrier Impaired Vision: Yes Glasses Impaired Hearing: No Decreased Hand dexterity: No Knowledge/Comprehension Knowledge Level: Medium Comprehension Level: Medium Ability to understand written instructions: Medium Ability to understand verbal instructions: Medium Motivation Anxiety Level: Calm Cooperation: Cooperative Education Importance: Acknowledges Need Interest in Health Problems: Asks Questions Perception: Coherent Willingness to Engage in Self-Management Medium Activities: Readiness to Engage in Self-Management Medium Activities: Electronic Signature(s) Signed: 11/16/2022 3:35:10 PM By: Samuella Bruin Entered By: Samuella Bruin on 11/16/2022 13:51:49 -------------------------------------------------------------------------------- Fall Risk Assessment Details Patient Name: Date of Service: Albert Perez, Albert Perez. 11/16/2022 1:30 PM Medical Record Number: [id number] Patient Account Number:  [id number] Date of Birth/Sex: Treating RN: 05/08/1951 (72 y.o. Marlan Palau Primary Care Queenie Aufiero: Henrine Screws Other Clinician: Referring Masyn Fullam: Treating Zahava Quant/Extender: Gerald Leitz in Treatment: 0 Fall Risk Assessment Items Have you had 2 or more falls in the last 12 monthso 0 No Have you had any fall that resulted in injury in the last 12 monthso 0 No FALLS RISK SCREEN History of falling - immediate or within 3 months 0 No Secondary diagnosis (Do you have 2 or more medical diagnoseso) 0 No Ambulatory aid None/bed rest/wheelchair/nurse 0 Yes Crutches/cane/walker 0 No Furniture 0 No Intravenous therapy Access/Saline/Heparin Boykin Reaper  0 No Gait/Transferring Normal/ bed rest/ wheelchair 0 Yes Weak (short steps with or without shuffle, stooped but able to lift head while walking, may seek 0 No support from furniture) Impaired (short steps with shuffle, may have difficulty arising from chair, head down, impaired 0 No balance) Mental Status Oriented to own ability 0 Yes Overestimates or forgets limitations 0 No Risk Level: Low Risk Score: 0 Albert Perez, Albert Perez ([id number]) [id number] Page 3 of 4 Electronic Signature(s) -------------------------------------------------------------------------------- Foot Assessment Details Patient Name: Date of Service: Albert Perez, Albert Perez 11/16/2022 1:30 PM Medical Record Number: [id number] Patient Account Number: [id number] Date of Birth/Sex: Treating RN: Dec 20, 1950 (72 y.o. Marlan Palau Primary Care Naw [place]: Henrine Screws Other Clinician: Referring Brithney Bensen: Treating Catherene Kaleta/Extender: Modesto Charon Weeks in Treatment: 0 Foot Assessment Items Site Locations + = Sensation present, - = Sensation absent, C = Callus, U = Ulcer R = Redness, W = Warmth, M = Maceration, PU = Pre-ulcerative lesion F = Fissure, S = Swelling, D = Dryness Assessment Right:  Left: Other Deformity: No No Prior Foot Ulcer: No No Prior Amputation: No No Charcot Joint: No No Ambulatory Status: Non-ambulatory Assistance Device: Wheelchair Gait: Electronic Signature(s) Signed: 11/16/2022 3:35:10 PM By: Samuella Bruin Entered By: Samuella Bruin on 11/16/2022 13:52:32 -------------------------------------------------------------------------------- Nutrition Risk Screening Details Patient Name: Date of Service: Albert Perez, Albert Perez 11/16/2022 1:30 PM Medical Record Number: [id number] Patient Account Number: [id number] Date of Birth/Sex: Treating RN: 16-Oct-1950 (72 y.o. Marlan Palau Primary Care Vanya Carberry: Henrine Screws Other Clinician: Referring Maida Widger: Treating Emilie Carp/Extender: Modesto Charon Weeks in Treatment: 0 Height (in): Weight (lbs): Body Mass Index (BMI): Albert Perez, Albert Perez ([id number]) [id number] Page 4 of 4 Nutrition Risk Screening Items Score Screening NUTRITION RISK SCREEN: I have an illness or condition that made me change the kind and/or amount of food I eat 0 No I eat fewer than two meals per day 0 No I eat few fruits and vegetables, or milk products 0 No I have three or more drinks of beer, liquor or wine almost every day 0 No I have tooth or mouth problems that make it hard for me to eat 0 No I don't always have enough money to buy the food I need 0 No I eat alone most of the time 0 No I take three or more different prescribed or over-the-counter drugs a day 1 Yes Without wanting to, I have lost or gained 10 pounds in the last six months 2 Yes I am not always physically able to shop, cook and/or feed myself 2 Yes Nutrition Protocols Good Risk Protocol Moderate Risk Protocol 0 Provide education on nutrition High Risk Proctocol Risk Level: Moderate Risk Score: 5 Electronic Signature(s) Signed: 11/16/2022 3:35:10 PM By: Samuella Bruin Entered By: Samuella Bruin on  11/16/2022 13:52:23

## 2022-11-17 ENCOUNTER — Other Ambulatory Visit: Payer: Self-pay | Admitting: Internal Medicine

## 2022-11-17 ENCOUNTER — Telehealth: Payer: Self-pay | Admitting: Medical Oncology

## 2022-11-17 DIAGNOSIS — M6281 Muscle weakness (generalized): Secondary | ICD-10-CM | POA: Diagnosis not present

## 2022-11-17 DIAGNOSIS — C3492 Malignant neoplasm of unspecified part of left bronchus or lung: Secondary | ICD-10-CM

## 2022-11-17 MED ORDER — TEPOTINIB HCL 225 MG PO TABS
450.0000 mg | ORAL_TABLET | Freq: Every day | ORAL | 3 refills | Status: DC
Start: 2022-11-17 — End: 2022-11-18

## 2022-11-17 NOTE — Telephone Encounter (Signed)
[facility] requested a new rx for tepotinib because Albert Perez told their pharmacist that he is only taking 1 tablet QOD.  Latest rx on 08/09/22 was for  450 mg / day.

## 2022-11-18 ENCOUNTER — Encounter: Payer: Self-pay | Admitting: Internal Medicine

## 2022-11-18 MED ORDER — TEPOTINIB HCL 225 MG PO TABS
225.0000 mg | ORAL_TABLET | ORAL | 1 refills | Status: DC
Start: 2022-11-18 — End: 2022-12-17

## 2022-11-18 NOTE — Addendum Note (Signed)
Addended by: Charma Igo on: 11/18/2022 03:00 PM   Modules accepted: Orders

## 2022-11-18 NOTE — Telephone Encounter (Signed)
Tepotinib dose- " We will keep the prescription once daily but we will keep adjusting the dose according to his tolerance.  Thank you".

## 2022-11-20 DIAGNOSIS — M6281 Muscle weakness (generalized): Secondary | ICD-10-CM | POA: Diagnosis not present

## 2022-11-22 NOTE — Progress Notes (Addendum)
Palliative Medicine [facility]  Telephone:([phone] Fax:(336) [phone]   Name: Albert Perez Date: 11/22/2022 MRN: [id number]  DOB: September 20, 1950  Patient Care Team: Henrine Screws, MD as PCP - General (Family Medicine) Regan Lemming, MD as PCP - Electrophysiology (Cardiology) Runell Gess, MD as PCP - Cardiology (Cardiology) Otho Darner, MD as Consulting Physician (Orthopedic Surgery) Pickenpack-Cousar, Arty Baumgartner, NP as Nurse Practitioner (Nurse Practitioner)    I connected with Albert Perez on 08/09/22 at  9:00 AM EDT by video and verified that I am speaking with the correct person using two identifiers.   I discussed the limitations, risks, security and privacy concerns of performing an evaluation and management service by telemedicine and the availability of in-person appointments. I also discussed with the patient that there may be a patient responsible charge related to this service. The patient expressed understanding and agreed to proceed.   Other persons participating in the visit and their role in the encounter: [name], wife   Patient's location: home  Provider's location: [facility]   Chief Complaint: follow up of symptom management   INTERVAL HISTORY: Albert Perez is a 72 y.o. 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 Recent scans  Palliative ask to see for.  Palliative ask to see for symptom management.  SOCIAL HISTORY:     reports that he has been smoking cigarettes. He has a 7.50 pack-year smoking history. He has never used smokeless tobacco. He reports current alcohol use of about 2.0 standard drinks of alcohol per week. He reports current drug use. Drug: Marijuana.  ADVANCE DIRECTIVES:    CODE  STATUS:   PAST MEDICAL HISTORY: Past Medical History:  Diagnosis Date   A-fib [facility])    PMH: 2000-2001   Arthritis    Cancer (HCC)    melanoma 2018   Diarrhea    due to medications   DVT (deep venous thrombosis) (HCC)    Left brachial vein DVT (01/30/19, following fracture)   Dysrhythmia    afib -    ETOH abuse    GERD (gastroesophageal reflux disease)    ocassional   History of radiation therapy    Right Hip 02/08/22-02/19/22-Dr. Antony Blackbird   History of radiation therapy    Right Pelvis-05/06/22-05/19/22- Dr. Antony Blackbird   Lung cancer [facility]) dx'd 02/26/2019   Metastatic cancer to bone (HCC) dx'd 02/26/2019   Sleep apnea    do not wear CPAP   Stroke (HCC) 2019   " mini"   Vertigo    Wears glasses     ALLERGIES:  has No Known Allergies.  MEDICATIONS:  Current Outpatient Medications  Medication Sig Dispense Refill   apixaban (ELIQUIS) 5 MG TABS tablet Take 1 tablet (5 mg total) by mouth 2 (two) times daily. 60 tablet 2   bacitracin ointment Apply 1 Application topically 2 (two) times daily. 120 g 0   buPROPion (WELLBUTRIN XL) 150 MG 24 hr tablet Take 150 mg by mouth daily as needed (nicotine cravings).     carvedilol (COREG) 6.25 MG tablet TAKE 1 TABLET BY MOUTH TWICE A DAY 180 tablet 3   Cholecalciferol (VITAMIN D-3) 25 MCG (1000 UT) CAPS Take 1,000 Units by mouth daily.     dexamethasone (DECADRON) 4 MG tablet Take 1 tablet (4 mg total) by mouth 2 (  two) times daily. 30 tablet 0   furosemide (LASIX) 20 MG tablet TAKE 1 TABLET BY MOUTH EVERY DAY 90 tablet 3   loperamide (IMODIUM) 2 MG capsule Take 2 mg by mouth as needed for diarrhea or loose stools.     Magnesium 500 MG CAPS Take 1,000 mg by mouth daily.     Multiple Vitamin (MULTIVITAMIN PO) Take 1 tablet by mouth daily. Centrum     osimertinib mesylate (TAGRISSO) 80 MG tablet Take 1 tablet (80 mg total) by mouth daily. 30 tablet 3   OVER THE COUNTER MEDICATION Take 1 tablet by mouth daily. Standard Process, LivaPlex  (Liver supplement)     oxyCODONE-acetaminophen (PERCOCET) 7.5-325 MG tablet Take 1-2 tablets by mouth every 6 (six) hours as needed for severe pain. 90 tablet 0   potassium chloride (KLOR-CON) 10 MEQ tablet Take 1 tablet (10 mEq total) by mouth daily. 90 tablet 1   rosuvastatin (CRESTOR) 5 MG tablet Take 5 mg by mouth daily.  2   tepotinib hcl (TEPMETKO) 225 MG tablet Take 1 tablet (225 mg total) by mouth every other day. Take one tablet every other day 30 tablet 1   torsemide (DEMADEX) 20 MG tablet TAKE 1 TABLET BY MOUTH TWICE A DAY 180 tablet 1   Triamcinolone Acetonide (TRIAMCINOLONE 0.1 % CREAM : EUCERIN) CREA Apply 1 Application topically 2 (two) times daily. 1 each 0   TURMERIC PO Take 1 capsule by mouth daily.     vitamin B-12 (CYANOCOBALAMIN) 1000 MCG tablet Take 1,000 mcg by mouth daily.     No current facility-administered medications for this visit.    VITAL SIGNS: There were no vitals taken for this visit. There were no vitals filed for this visit.   Estimated body mass index is 22.38 kg/m as calculated from the following:   Height as of 10/19/22: 5' 7.5" (1.715 m).   Weight as of 10/19/22: 145 lb (65.8 kg).   PERFORMANCE STATUS (ECOG) : 4 - Bedbound per family's description and recorded video patient is bedbound requires significant assistant with transfers. Unable to get out of bed or chair independently. Most days confined to bed unless family provides total assist.   IMPRESSION: I connected with Albert Perez and his wife via phone. No acute distress noted. Denies any nausea, vomiting, constipation, or diarrhea. Albert Perez states overall he feels he is getting better by the day.   Wife shares he was seen recently at the wound clinic with plans for follow-up next month. Leg wounds are healing. She continues to provide daily care with support of home health. Patient is working with in home PT.   Pain is well controlled. Is taking Percocet 2-3 times daily. Some days are better than  others.   Patient and wife continues to remain hopeful for some stability and ongoing improvement.   All questions answered and support provided. Wife knows to contact office as needed.   PLAN: Oxycodone 7.5 mg every 6-8 hours as needed for breakthrough pain Xtampza successfully weaned off.  Miralax daily for bowel regimen  We will continue to closely monitor and support as needed. I will plan to see patient back in 3-4 weeks in collaboration with his other oncology/radiation appointments. Patient and wife knows to contact our office sooner if needed.   Mrs. Kalan expressed understanding and was in agreement with this plan. He also understands that He can call the clinic at any time with any questions, concerns, or complaints.     Any controlled substances  utilized were prescribed in the context of palliative care. PDMP has been reviewed.    Visit consisted of counseling and education dealing with the complex and emotionally intense issues of symptom management and palliative care in the setting of serious and potentially life-threatening illness.Greater than 50%  of this time was spent counseling and coordinating care related to the above assessment and plan.  Willette Alma, AGPCNP-BC  Palliative Medicine Team/[facility]  *Please note that this is a verbal dictation therefore any spelling or grammatical errors are due to the "Dragon Medical One" system interpretation.

## 2022-11-23 DIAGNOSIS — M6281 Muscle weakness (generalized): Secondary | ICD-10-CM | POA: Diagnosis not present

## 2022-11-24 ENCOUNTER — Inpatient Hospital Stay: Payer: Medicare Other | Attending: Internal Medicine | Admitting: Nurse Practitioner

## 2022-11-24 ENCOUNTER — Encounter: Payer: Self-pay | Admitting: Nurse Practitioner

## 2022-11-24 DIAGNOSIS — Z7189 Other specified counseling: Secondary | ICD-10-CM

## 2022-11-24 DIAGNOSIS — G893 Neoplasm related pain (acute) (chronic): Secondary | ICD-10-CM

## 2022-11-24 DIAGNOSIS — R531 Weakness: Secondary | ICD-10-CM | POA: Diagnosis not present

## 2022-11-24 DIAGNOSIS — Z515 Encounter for palliative care: Secondary | ICD-10-CM

## 2022-11-24 DIAGNOSIS — C3492 Malignant neoplasm of unspecified part of left bronchus or lung: Secondary | ICD-10-CM | POA: Diagnosis not present

## 2022-11-25 ENCOUNTER — Other Ambulatory Visit (HOSPITAL_COMMUNITY): Payer: Self-pay

## 2022-11-27 ENCOUNTER — Encounter: Payer: Self-pay | Admitting: Internal Medicine

## 2022-11-30 ENCOUNTER — Other Ambulatory Visit (HOSPITAL_COMMUNITY): Payer: Self-pay

## 2022-11-30 ENCOUNTER — Telehealth: Payer: Self-pay | Admitting: Medical Oncology

## 2022-11-30 ENCOUNTER — Other Ambulatory Visit: Payer: Self-pay | Admitting: Internal Medicine

## 2022-11-30 ENCOUNTER — Other Ambulatory Visit: Payer: Self-pay | Admitting: Physician Assistant

## 2022-11-30 DIAGNOSIS — M6281 Muscle weakness (generalized): Secondary | ICD-10-CM | POA: Diagnosis not present

## 2022-11-30 DIAGNOSIS — I2692 Saddle embolus of pulmonary artery without acute cor pulmonale: Secondary | ICD-10-CM

## 2022-11-30 MED ORDER — APIXABAN 5 MG PO TABS
5.0000 mg | ORAL_TABLET | Freq: Two times a day (BID) | ORAL | 2 refills | Status: DC
Start: 2022-11-30 — End: 2022-12-01

## 2022-11-30 NOTE — Telephone Encounter (Signed)
I told Albert Perez  pt will have a phone visit tomorrow. Transportation-decision pending.

## 2022-11-30 NOTE — Telephone Encounter (Signed)
Refill request for [name].  Appt tomorrow-Albert Perez cannot afford to pay$100 for transportation to bring Albert Perez to cancer center. She wants a phone visit.

## 2022-12-01 ENCOUNTER — Other Ambulatory Visit: Payer: Self-pay | Admitting: Nurse Practitioner

## 2022-12-01 ENCOUNTER — Other Ambulatory Visit (HOSPITAL_COMMUNITY): Payer: Self-pay

## 2022-12-01 ENCOUNTER — Inpatient Hospital Stay (HOSPITAL_BASED_OUTPATIENT_CLINIC_OR_DEPARTMENT_OTHER): Payer: Medicare Other | Admitting: Internal Medicine

## 2022-12-01 ENCOUNTER — Other Ambulatory Visit: Payer: Self-pay

## 2022-12-01 ENCOUNTER — Other Ambulatory Visit: Payer: Self-pay | Admitting: Medical Oncology

## 2022-12-01 DIAGNOSIS — C3490 Malignant neoplasm of unspecified part of unspecified bronchus or lung: Secondary | ICD-10-CM | POA: Diagnosis not present

## 2022-12-01 DIAGNOSIS — I2692 Saddle embolus of pulmonary artery without acute cor pulmonale: Secondary | ICD-10-CM

## 2022-12-01 DIAGNOSIS — G893 Neoplasm related pain (acute) (chronic): Secondary | ICD-10-CM

## 2022-12-01 DIAGNOSIS — Z515 Encounter for palliative care: Secondary | ICD-10-CM

## 2022-12-01 MED ORDER — APIXABAN 5 MG PO TABS
5.0000 mg | ORAL_TABLET | Freq: Two times a day (BID) | ORAL | 2 refills | Status: DC
Start: 2022-12-01 — End: 2022-12-01

## 2022-12-01 MED ORDER — METHYLPREDNISOLONE 4 MG PO TBPK: ORAL_TABLET | ORAL | 0 refills | Status: DC

## 2022-12-01 MED ORDER — APIXABAN 5 MG PO TABS
5.0000 mg | ORAL_TABLET | Freq: Two times a day (BID) | ORAL | 2 refills | Status: DC
Start: 2022-12-01 — End: 2023-01-12

## 2022-12-01 MED ORDER — OXYCODONE-ACETAMINOPHEN 7.5-325 MG PO TABS
1.0000 | ORAL_TABLET | Freq: Four times a day (QID) | ORAL | 0 refills | Status: DC | PRN
Start: 2022-12-01 — End: 2022-12-02

## 2022-12-01 NOTE — Progress Notes (Signed)
[facility] Telephone:([phone]) [phone]   Fax:(336) [phone]  PROGRESS NOTE FOR TELEMEDICINE VISITS  Henrine Screws, MD [address] [place] [address]  I connected withNAME@ on 12/01/22 at  1:30 PM EDT by telephone visit and verified that I am speaking with the correct person using two identifiers.   I discussed the limitations, risks, security and privacy concerns of performing an evaluation and management service by telemedicine and the availability of in-person appointments. I also discussed with the patient that there may be a patient responsible charge related to this service. The patient expressed understanding and agreed to proceed.  Other persons participating in the visit and their role in the encounter: Wife  Patient's location: Home Provider's location: [facility]  DIAGNOSIS: Stage IV (T3, N0, M1c) non-small cell lung cancer, adenocarcinoma presented with cavitary left lower lobe lung mass in addition to other pulmonary nodules and scattered hypermetabolic osseous metastatic disease involving the spine, ribs and bony pelvis as well as left proximal humeral pathologic fracture.    Molecular Biomarkers performed by [employer] DETECTED ALTERATION(S) / [name])      [id number]  Afatinib, Dacomitinib, Erlotinib, Gefitinib, Osimertinib, Ramucirumab Neratinib   FGFR1Amplification  Erdafitinib, Lenvatinib, Nintedanib, Pazopanib, Pemigatinib, Ponatinib   [id number] None   Repeated Moleculars by [employer]: Positive for MET Amplification     PRIOR THERAPY:  1) Palliative radiotherapy to the the left humerus under the care of Dr. Roselind Messier. Last treatment ~04/24/2019.  2) Palliative radiation to the Right Pelvis/Ilium under the care of Dr. Roselind Messier. Last dose 05/19/22.    CURRENT THERAPY: 1) Targeted therapy with Tagrisso 80 p.o. daily.  He started the first dose on April 21, 2019. Tepmetko 450 mg p.o. every other day starting  on 08/24/22.  2) Xgeva every 4 weeks, last dose 04/27/22.   INTERVAL HISTORY: Albert Perez 72 y.o. male has a telephone virtual visit with me today for evaluation and recommendation regarding his condition.  The patient continues to complain of the significant fatigue and weakness in his mainly bedridden at this point.  He is even using diaper for his urine and bowel movement.  He has been seen by the wound care clinic for management of the wound on the left leg.  He denied having any chest pain, shortness of breath except with exertion with no cough or hemoptysis.  He has no nausea, vomiting, diarrhea or constipation.  He continues to tolerate his treatment with Tagrisso and Tepotinib (Tepmetko) fairly well.  He was unable to come to the clinic for evaluation for several months now.  He continues to have poor appetite and according to his wife he lost a lot of weight.  MEDICAL HISTORY: Past Medical History:  Diagnosis Date   A-fib [facility])    PMH: 2000-2001   Arthritis    Cancer (HCC)    melanoma 2018   Diarrhea    due to medications   DVT (deep venous thrombosis) (HCC)    Left brachial vein DVT (01/30/19, following fracture)   Dysrhythmia    afib -    ETOH abuse    GERD (gastroesophageal reflux disease)    ocassional   History of radiation therapy    Right Hip 02/08/22-02/19/22-Dr. Antony Blackbird   History of radiation therapy    Right Pelvis-05/06/22-05/19/22- Dr. Antony Blackbird   Lung cancer [facility]) dx'd 02/26/2019   Metastatic cancer to bone (HCC) dx'd 02/26/2019   Sleep apnea    do not wear CPAP  Stroke [facility]) 2019   " mini"   Vertigo    Wears glasses     ALLERGIES:  has No Known Allergies.  MEDICATIONS:  Current Outpatient Medications  Medication Sig Dispense Refill   apixaban (ELIQUIS) 5 MG TABS tablet Take 1 tablet (5 mg total) by mouth 2 (two) times daily. 60 tablet 2   bacitracin ointment Apply 1 Application topically 2 (two) times daily. 120 g 0   buPROPion (WELLBUTRIN XL)  150 MG 24 hr tablet Take 150 mg by mouth daily as needed (nicotine cravings).     carvedilol (COREG) 6.25 MG tablet TAKE 1 TABLET BY MOUTH TWICE A DAY 180 tablet 3   Cholecalciferol (VITAMIN D-3) 25 MCG (1000 UT) CAPS Take 1,000 Units by mouth daily.     dexamethasone (DECADRON) 4 MG tablet Take 1 tablet (4 mg total) by mouth 2 (two) times daily. 30 tablet 0   furosemide (LASIX) 20 MG tablet TAKE 1 TABLET BY MOUTH EVERY DAY 90 tablet 3   loperamide (IMODIUM) 2 MG capsule Take 2 mg by mouth as needed for diarrhea or loose stools.     Magnesium 500 MG CAPS Take 1,000 mg by mouth daily.     Multiple Vitamin (MULTIVITAMIN PO) Take 1 tablet by mouth daily. Centrum     osimertinib mesylate (TAGRISSO) 80 MG tablet Take 1 tablet (80 mg total) by mouth daily. 30 tablet 3   OVER THE COUNTER MEDICATION Take 1 tablet by mouth daily. Standard Process, LivaPlex (Liver supplement)     oxyCODONE-acetaminophen (PERCOCET) 7.5-325 MG tablet Take 1-2 tablets by mouth every 6 (six) hours as needed for severe pain. 90 tablet 0   potassium chloride (KLOR-CON) 10 MEQ tablet Take 1 tablet (10 mEq total) by mouth daily. 90 tablet 1   rosuvastatin (CRESTOR) 5 MG tablet Take 5 mg by mouth daily.  2   tepotinib hcl (TEPMETKO) 225 MG tablet Take 1 tablet (225 mg total) by mouth every other day. Take one tablet every other day 30 tablet 1   torsemide (DEMADEX) 20 MG tablet TAKE 1 TABLET BY MOUTH TWICE A DAY 180 tablet 1   Triamcinolone Acetonide (TRIAMCINOLONE 0.1 % CREAM : EUCERIN) CREA Apply 1 Application topically 2 (two) times daily. 1 each 0   TURMERIC PO Take 1 capsule by mouth daily.     vitamin B-12 (CYANOCOBALAMIN) 1000 MCG tablet Take 1,000 mcg by mouth daily.     No current facility-administered medications for this visit.    SURGICAL HISTORY:  Past Surgical History:  Procedure Laterality Date   ANKLE FRACTURE SURGERY Right 2003   COLONOSCOPY  2018   ELBOW SURGERY Left 1964   INGUINAL HERNIA REPAIR Bilateral  11/12/2020   Procedure: BILATERAL OPEN INGUINAL HERNIA REPAIR WITH MESH;  Surgeon: Abigail Miyamoto, MD;  Location: [facility] OR;  Service: General;  Laterality: Bilateral;   melanoma removal  04/2017   Lt shoulder   RETINAL DETACHMENT SURGERY Right 1971   TONSILLECTOMY     VIDEO BRONCHOSCOPY WITH ENDOBRONCHIAL NAVIGATION N/A 03/28/2019   Procedure: VIDEO BRONCHOSCOPY WITH ENDOBRONCHIAL NAVIGATION and Biopsies;  Surgeon: Leslye Peer, MD;  Location: [facility] OR;  Service: Thoracic;  Laterality: N/A;    REVIEW OF SYSTEMS:  Constitutional: positive for anorexia, fatigue, and weight loss Eyes: negative Ears, nose, mouth, throat, and face: negative Respiratory: positive for dyspnea on exertion Cardiovascular: negative Gastrointestinal: negative Genitourinary:negative Integument/breast: negative Hematologic/lymphatic: negative Musculoskeletal:positive for muscle weakness Neurological: negative Behavioral/Psych: negative Endocrine: negative Allergic/Immunologic: negative  LABORATORY DATA: Lab Results  Component Value Date   WBC 5.7 10/26/2022   HGB 10.4 (L) 10/26/2022   HCT 31.4 (L) 10/26/2022   MCV 91.0 10/26/2022   PLT 371 10/26/2022      Chemistry      Component Value Date/Time   NA 134 (L) 10/26/2022 1226   NA 138 06/10/2022 1006   K 4.9 10/26/2022 1226   CL 104 10/26/2022 1226   CO2 26 10/26/2022 1226   BUN 20 10/26/2022 1226   BUN 17 06/10/2022 1006   CREATININE 0.95 10/26/2022 1226      Component Value Date/Time   CALCIUM 8.1 (L) 10/26/2022 1226   ALKPHOS 140 (H) 10/26/2022 1226   AST 11 (L) 10/26/2022 1226   ALT 9 10/26/2022 1226   BILITOT 0.2 (L) 10/26/2022 1226       RADIOGRAPHIC STUDIES: No results found.  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 The patient started targeted therapy with Tagrisso 80 mg p.o. daily since April 21, 2019 and he has been tolerating his treatment well except for recent disease progression. He had repeat molecular studies by [employer] that showed positive MET amplification. I added Tepotinib (Tepmetko) to his treatment at a dose of 450 mg p.o. now every other day started August 24, 2022. The patient started feeling much better after starting the treatment. He has more energy and started moving a little bit compared to before. He has no significant adverse effect from the treatment with the combination of Tagrisso and Tepotinib (Tepmetko).  The patient has been tolerating this treatment with Tagrisso and Tepotinib (Tepmetko). I recommended for him to continue on the same treatment for now. I will arrange for the patient to have repeat blood work and CT scan of the chest, abdomen and pelvis for restaging of his disease. He will continue to have home health visit and probably benefit from PT for his weakness. For the lack of appetite and weight loss, I will give him prescription for Medrol Dosepak. I hope the patient will be able to make it to the upcoming appointment in 6 weeks for evaluation in person since he has not been seen for a while. For the leg wound issues, he is followed by the wound clinic. The patient was advised to call if he has any other concerning symptoms in the interval. I discussed the assessment and treatment plan with the patient. The patient was provided an opportunity to ask questions and all were answered. The patient agreed with the plan and demonstrated an understanding of the instructions.   The patient was advised to call back or seek an in-person evaluation if the symptoms worsen or if the condition fails to improve as anticipated.  I provided 22 minutes of non face-to-face telephone visit time during this encounter, and > 50% was spent counseling  as documented under my assessment & plan.  Lajuana Matte, MD 12/01/2022 1:32 PM  Disclaimer: This note was dictated with voice recognition software. Similar sounding words can inadvertently be transcribed and may not be corrected upon review.

## 2022-12-01 NOTE — Progress Notes (Unsigned)
[name] ready for pick up.

## 2022-12-02 ENCOUNTER — Other Ambulatory Visit: Payer: Self-pay | Admitting: Nurse Practitioner

## 2022-12-02 ENCOUNTER — Encounter: Payer: Self-pay | Admitting: Internal Medicine

## 2022-12-02 ENCOUNTER — Other Ambulatory Visit (HOSPITAL_COMMUNITY): Payer: Self-pay

## 2022-12-02 ENCOUNTER — Other Ambulatory Visit: Payer: Self-pay

## 2022-12-02 ENCOUNTER — Telehealth: Payer: Self-pay

## 2022-12-02 DIAGNOSIS — C3490 Malignant neoplasm of unspecified part of unspecified bronchus or lung: Secondary | ICD-10-CM

## 2022-12-02 DIAGNOSIS — Z515 Encounter for palliative care: Secondary | ICD-10-CM

## 2022-12-02 DIAGNOSIS — G893 Neoplasm related pain (acute) (chronic): Secondary | ICD-10-CM

## 2022-12-02 MED ORDER — OXYCODONE-ACETAMINOPHEN 7.5-325 MG PO TABS
1.0000 | ORAL_TABLET | Freq: Four times a day (QID) | ORAL | 0 refills | Status: DC | PRN
Start: 2022-12-02 — End: 2022-12-20
  Filled 2022-12-02: qty 10, 2d supply, fill #0

## 2022-12-02 MED ORDER — OXYCODONE-ACETAMINOPHEN 7.5-325 MG PO TABS
1.0000 | ORAL_TABLET | Freq: Four times a day (QID) | ORAL | 0 refills | Status: DC | PRN
Start: 2022-12-02 — End: 2022-12-02
  Filled 2022-12-02: qty 90, 12d supply, fill #0

## 2022-12-02 MED ORDER — OXYCODONE-ACETAMINOPHEN 7.5-325 MG PO TABS
1.0000 | ORAL_TABLET | Freq: Four times a day (QID) | ORAL | 0 refills | Status: DC | PRN
Start: 2022-12-02 — End: 2022-12-02
  Filled 2022-12-02: qty 30, 4d supply, fill #0

## 2022-12-02 NOTE — Telephone Encounter (Signed)
Pt called reporting that his pharmacy did not have a medication in stock and was re-sent to [facility] outpatient pharmacy per [place], NP, pt notified of this change no further needs at this time.

## 2022-12-02 NOTE — Telephone Encounter (Signed)
Pt called back reporting that he was unable to pick up percocet prescription because it is being mailed to his house. Pt is completely out of pain medication, small quantity  ordered per Lowella Bandy, NP for pt to pick up and take while medication is being shipped, confirmed with pharmacy and pt. Pt verbalized understanding. No further needs at this points.

## 2022-12-04 DIAGNOSIS — M6281 Muscle weakness (generalized): Secondary | ICD-10-CM | POA: Diagnosis not present

## 2022-12-06 ENCOUNTER — Other Ambulatory Visit: Payer: Self-pay | Admitting: Medical Oncology

## 2022-12-06 ENCOUNTER — Other Ambulatory Visit (HOSPITAL_COMMUNITY): Payer: Self-pay

## 2022-12-06 ENCOUNTER — Telehealth: Payer: Self-pay | Admitting: Medical Oncology

## 2022-12-06 DIAGNOSIS — C3490 Malignant neoplasm of unspecified part of unspecified bronchus or lung: Secondary | ICD-10-CM

## 2022-12-06 DIAGNOSIS — C7951 Secondary malignant neoplasm of bone: Secondary | ICD-10-CM | POA: Diagnosis not present

## 2022-12-06 NOTE — Telephone Encounter (Signed)
Lab orders for CBC/diff, CMP  faxed to [facility] attention: [place] "[address] " [employer].

## 2022-12-07 DIAGNOSIS — M6281 Muscle weakness (generalized): Secondary | ICD-10-CM | POA: Diagnosis not present

## 2022-12-08 DIAGNOSIS — C7951 Secondary malignant neoplasm of bone: Secondary | ICD-10-CM | POA: Diagnosis not present

## 2022-12-09 ENCOUNTER — Other Ambulatory Visit (HOSPITAL_COMMUNITY): Payer: Self-pay

## 2022-12-09 ENCOUNTER — Other Ambulatory Visit: Payer: Self-pay

## 2022-12-13 DIAGNOSIS — R262 Difficulty in walking, not elsewhere classified: Secondary | ICD-10-CM | POA: Diagnosis not present

## 2022-12-13 DIAGNOSIS — M6281 Muscle weakness (generalized): Secondary | ICD-10-CM | POA: Diagnosis not present

## 2022-12-14 ENCOUNTER — Other Ambulatory Visit (HOSPITAL_COMMUNITY): Payer: Self-pay

## 2022-12-15 ENCOUNTER — Other Ambulatory Visit (HOSPITAL_COMMUNITY): Payer: Self-pay

## 2022-12-15 ENCOUNTER — Telehealth: Payer: Self-pay | Admitting: Pharmacy Technician

## 2022-12-15 NOTE — Telephone Encounter (Signed)
Oral Oncology Patient Advocate Encounter   Received notification that prior authorization for Tepmetko is required for a quantity limit override   PA submitted on 12/15/22 Key [id number] Status is pending     Jinger Neighbors, CPhT-Adv Oncology Pharmacy Patient Advocate [facility] Direct Number: [phone]  Fax: [phone]

## 2022-12-16 ENCOUNTER — Ambulatory Visit (HOSPITAL_BASED_OUTPATIENT_CLINIC_OR_DEPARTMENT_OTHER): Payer: Medicare Other | Admitting: General Surgery

## 2022-12-17 ENCOUNTER — Other Ambulatory Visit: Payer: Self-pay

## 2022-12-17 ENCOUNTER — Other Ambulatory Visit (HOSPITAL_COMMUNITY): Payer: Self-pay

## 2022-12-17 DIAGNOSIS — C3492 Malignant neoplasm of unspecified part of left bronchus or lung: Secondary | ICD-10-CM

## 2022-12-17 DIAGNOSIS — M6281 Muscle weakness (generalized): Secondary | ICD-10-CM | POA: Diagnosis not present

## 2022-12-17 DIAGNOSIS — R262 Difficulty in walking, not elsewhere classified: Secondary | ICD-10-CM | POA: Diagnosis not present

## 2022-12-17 MED ORDER — TEPOTINIB HCL 225 MG PO TABS
225.0000 mg | ORAL_TABLET | Freq: Every day | ORAL | 1 refills | Status: DC
Start: 2022-12-17 — End: 2023-01-12
  Filled 2022-12-17: qty 60, 60d supply, fill #0

## 2022-12-17 NOTE — Telephone Encounter (Signed)
Oral Oncology Patient Advocate Encounter  Insurance was unable to place override. Patient will fill with [facility] going forward for easier management.   I have spoken with the patient's wife, [name]. She understands and is okay with the change in pharmacy. Per [name], patient has ~60 tabs on hand as of today.   I will follow up with the patient closer to the time they need refills.  Jinger Neighbors, CPhT-Adv Oncology Pharmacy Patient Advocate [facility] Direct Number: [phone]  Fax: [phone]

## 2022-12-20 ENCOUNTER — Other Ambulatory Visit: Payer: Self-pay

## 2022-12-20 ENCOUNTER — Other Ambulatory Visit (HOSPITAL_COMMUNITY): Payer: Self-pay

## 2022-12-20 DIAGNOSIS — C7951 Secondary malignant neoplasm of bone: Secondary | ICD-10-CM

## 2022-12-20 DIAGNOSIS — G893 Neoplasm related pain (acute) (chronic): Secondary | ICD-10-CM

## 2022-12-20 DIAGNOSIS — Z515 Encounter for palliative care: Secondary | ICD-10-CM

## 2022-12-20 MED ORDER — OXYCODONE-ACETAMINOPHEN 7.5-325 MG PO TABS
1.0000 | ORAL_TABLET | Freq: Four times a day (QID) | ORAL | 0 refills | Status: DC | PRN
Start: 2022-12-20 — End: 2023-01-12
  Filled 2022-12-20: qty 90, 12d supply, fill #0

## 2022-12-20 NOTE — Telephone Encounter (Signed)
Refilled per patient's request. I have reviewed the PDMP during this encounter.

## 2022-12-20 NOTE — Telephone Encounter (Signed)
Pt wife joyce called for refill of oxycodone, sent to preferred pharmacy, see new orders.

## 2022-12-21 ENCOUNTER — Other Ambulatory Visit: Payer: Self-pay

## 2022-12-21 ENCOUNTER — Other Ambulatory Visit (HOSPITAL_COMMUNITY): Payer: Self-pay

## 2022-12-24 DIAGNOSIS — R262 Difficulty in walking, not elsewhere classified: Secondary | ICD-10-CM | POA: Diagnosis not present

## 2022-12-24 DIAGNOSIS — M6281 Muscle weakness (generalized): Secondary | ICD-10-CM | POA: Diagnosis not present

## 2022-12-24 NOTE — Progress Notes (Deleted)
Palliative Medicine [facility]  [phone] [phone]   Name: Albert Perez Date: 12/24/2022 MRN: [id number]  DOB: 03/12/1951  Patient Care Team: Henrine Screws, MD as PCP - General (Family Medicine) Regan Lemming, MD as PCP - Electrophysiology (Cardiology) Runell Gess, MD as PCP - Cardiology (Cardiology) Otho Darner, MD as Consulting Physician (Orthopedic Surgery) Pickenpack-Cousar, Arty Baumgartner, NP as Nurse Practitioner (Nurse Practitioner)    I connected with Albert Perez on 08/09/22 at 10:30 AM EDT by video and verified that I am speaking with the correct person using two identifiers.   I discussed the limitations, risks, security and privacy concerns of performing an evaluation and management service by telemedicine and the availability of in-person appointments. I also discussed with the patient that there may be a patient responsible charge related to this service. The patient expressed understanding and agreed to proceed.   Other persons participating in the visit and their role in the encounter: Albert Perez, wife   Patient's location: home  Provider's location: [facility]   Chief Complaint: follow up of symptom management   INTERVAL HISTORY: Albert Perez is a 72 y.o. 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 Recent scans  Palliative ask to see for.  Palliative ask to see for symptom management.  SOCIAL HISTORY:     reports that he has been smoking cigarettes. He has a 7.50 pack-year smoking history. He has never used smokeless tobacco. He reports current alcohol use of about 2.0 standard drinks of alcohol per week. He reports current drug use. Drug: Marijuana.  ADVANCE DIRECTIVES:    CODE  STATUS:   PAST MEDICAL HISTORY: Past Medical History:  Diagnosis Date   A-fib [facility])    PMH: 2000-2001   Arthritis    Cancer (HCC)    melanoma 2018   Diarrhea    due to medications   DVT (deep venous thrombosis) (HCC)    Left brachial vein DVT (01/30/19, following fracture)   Dysrhythmia    afib -    ETOH abuse    GERD (gastroesophageal reflux disease)    ocassional   History of radiation therapy    Right Hip 02/08/22-02/19/22-Dr. Antony Blackbird   History of radiation therapy    Right Pelvis-05/06/22-05/19/22- Dr. Antony Blackbird   Lung cancer [facility]) dx'd 02/26/2019   Metastatic cancer to bone (HCC) dx'd 02/26/2019   Sleep apnea    do not wear CPAP   Stroke (HCC) 2019   " mini"   Vertigo    Wears glasses     ALLERGIES:  has No Known Allergies.  MEDICATIONS:  Current Outpatient Medications  Medication Sig Dispense Refill   apixaban (ELIQUIS) 5 MG TABS tablet Take 1 tablet (5 mg total) by mouth 2 (two) times daily. 60 tablet 2   bacitracin ointment Apply 1 Application topically 2 (two) times daily. 120 g 0   buPROPion (WELLBUTRIN XL) 150 MG 24 hr tablet Take 150 mg by mouth daily as needed (nicotine cravings).     carvedilol (COREG) 6.25 MG tablet TAKE 1 TABLET BY MOUTH TWICE A DAY 180 tablet 3   Cholecalciferol (VITAMIN D-3) 25 MCG (1000 UT) CAPS Take 1,000 Units by mouth daily.     dexamethasone (DECADRON) 4 MG tablet Take 1 tablet (4 mg total) by mouth 2 (two)  times daily. 30 tablet 0   furosemide (LASIX) 20 MG tablet TAKE 1 TABLET BY MOUTH EVERY DAY 90 tablet 3   loperamide (IMODIUM) 2 MG capsule Take 2 mg by mouth as needed for diarrhea or loose stools.     Magnesium 500 MG CAPS Take 1,000 mg by mouth daily.     methylPREDNISolone (MEDROL DOSEPAK) 4 MG TBPK tablet Use as instructed. 21 tablet 0   Multiple Vitamin (MULTIVITAMIN PO) Take 1 tablet by mouth daily. Centrum     osimertinib mesylate (TAGRISSO) 80 MG tablet Take 1 tablet (80 mg total) by mouth daily. 30 tablet 3    OVER THE COUNTER MEDICATION Take 1 tablet by mouth daily. Standard Process, LivaPlex (Liver supplement)     oxyCODONE-acetaminophen (PERCOCET) 7.5-325 MG tablet Take 1-2 tablets by mouth every 6 (six) hours as needed for severe pain. 90 tablet 0   potassium chloride (KLOR-CON) 10 MEQ tablet Take 1 tablet (10 mEq total) by mouth daily. 90 tablet 1   rosuvastatin (CRESTOR) 5 MG tablet Take 5 mg by mouth daily.  2   tepotinib hcl (TEPMETKO) 225 MG tablet Take 1 tablet (225 mg total) by mouth daily. Take as directed 30 tablet 1   torsemide (DEMADEX) 20 MG tablet TAKE 1 TABLET BY MOUTH TWICE A DAY 180 tablet 1   Triamcinolone Acetonide (TRIAMCINOLONE 0.1 % CREAM : EUCERIN) CREA Apply 1 Application topically 2 (two) times daily. 1 each 0   TURMERIC PO Take 1 capsule by mouth daily.     vitamin B-12 (CYANOCOBALAMIN) 1000 MCG tablet Take 1,000 mcg by mouth daily.     No current facility-administered medications for this visit.    VITAL SIGNS: There were no vitals taken for this visit. There were no vitals filed for this visit.   Estimated body mass index is 22.38 kg/m as calculated from the following:   Height as of 10/19/22: 5' 7.5" (1.715 m).   Weight as of 10/19/22: 145 lb (65.8 kg).   PERFORMANCE STATUS (ECOG) : 4 - Bedbound per family's description and recorded video patient is bedbound requires significant assistant with transfers. Unable to get out of bed or chair independently. Most days confined to bed unless family provides total assist.   IMPRESSION:   PLAN: Oxycodone 7.5 mg every 6-8 hours as needed for breakthrough pain Xtampza successfully weaned off.  Miralax daily for bowel regimen  We will continue to closely monitor and support as needed. I will plan to see patient back in 3-4 weeks in collaboration with his other oncology/radiation appointments. Patient and wife knows to contact our office sooner if needed.   [name] expressed understanding and was in agreement with  this plan. He also understands that He can call the clinic at any time with any questions, concerns, or complaints.     Any controlled substances utilized were prescribed in the context of palliative care. PDMP has been reviewed.    Visit consisted of counseling and education dealing with the complex and emotionally intense issues of symptom management and palliative care in the setting of serious and potentially life-threatening illness.Greater than 50%  of this time was spent counseling and coordinating care related to the above assessment and plan.  Willette Alma, AGPCNP-BC  Palliative Medicine Team/[facility]  *Please note that this is a verbal dictation therefore any spelling or grammatical errors are due to the "[employer]agon [employer] One" system interpretation.

## 2022-12-25 DIAGNOSIS — M6281 Muscle weakness (generalized): Secondary | ICD-10-CM | POA: Diagnosis not present

## 2022-12-25 DIAGNOSIS — R262 Difficulty in walking, not elsewhere classified: Secondary | ICD-10-CM | POA: Diagnosis not present

## 2022-12-27 ENCOUNTER — Telehealth: Payer: Self-pay | Admitting: Internal Medicine

## 2022-12-27 DIAGNOSIS — R262 Difficulty in walking, not elsewhere classified: Secondary | ICD-10-CM | POA: Diagnosis not present

## 2022-12-27 DIAGNOSIS — M6281 Muscle weakness (generalized): Secondary | ICD-10-CM | POA: Diagnosis not present

## 2022-12-27 NOTE — Telephone Encounter (Signed)
Called patient regarding July appointments, left a voicemail. 

## 2022-12-28 ENCOUNTER — Other Ambulatory Visit: Payer: Self-pay | Admitting: Internal Medicine

## 2022-12-28 ENCOUNTER — Telehealth: Payer: Self-pay | Admitting: Medical Oncology

## 2022-12-28 DIAGNOSIS — C3492 Malignant neoplasm of unspecified part of left bronchus or lung: Secondary | ICD-10-CM

## 2022-12-28 NOTE — Telephone Encounter (Signed)
Increasing pain at lower back to right hip to lower leg. Describes pain as sharp and intermittent and brings tears in his eyes. Takes oxycodone 7.5 mg /day, sometimes it helps. [name] is concerning about his increasing pain in hip and down to lower leg . Would  Dr. Arbutus Ped Kinard  consider ordering an MRI?   He had radiation on that hip.

## 2022-12-28 NOTE — Telephone Encounter (Signed)
I LVM for [name] to call central scheduling to schedule mri hip and ct scans. I also told her [place] needs labs prior to Ct scans.

## 2022-12-29 ENCOUNTER — Other Ambulatory Visit (HOSPITAL_COMMUNITY): Payer: Self-pay

## 2022-12-29 ENCOUNTER — Telehealth: Payer: Self-pay | Admitting: Physician Assistant

## 2022-12-29 DIAGNOSIS — R262 Difficulty in walking, not elsewhere classified: Secondary | ICD-10-CM | POA: Diagnosis not present

## 2022-12-29 DIAGNOSIS — M6281 Muscle weakness (generalized): Secondary | ICD-10-CM | POA: Diagnosis not present

## 2022-12-29 NOTE — Telephone Encounter (Signed)
Patient is aware of upcoming appointment times/dates.  

## 2022-12-30 ENCOUNTER — Inpatient Hospital Stay: Payer: Medicare Other | Attending: Internal Medicine | Admitting: Nurse Practitioner

## 2023-01-03 DIAGNOSIS — M6281 Muscle weakness (generalized): Secondary | ICD-10-CM | POA: Diagnosis not present

## 2023-01-03 NOTE — Progress Notes (Signed)
Albert Perez ([id number]) [id number] Page 1 of 12 Visit Report for 11/16/2022 Chief Complaint Document Details Patient Name: Date of Service: Albert Perez, Albert Perez 11/16/2022 1:30 PM Medical Record Number: [id number] Patient Account Number: [id number] Date of Birth/Sex: Treating RN: 09/25/1950 (72 y.o. M) Primary Care Provider: Henrine Perez Other Clinician: Referring Provider: Treating Provider/Extender: Albert Perez in Treatment: 0 Information Obtained from: Patient Chief Complaint Patient is at the clinic for treatment of multiple open pressure ulcers Electronic Signature(s) Signed: 11/16/2022 2:06:15 PM By: Duanne Guess MD FACS Entered By: Duanne Guess on 11/16/2022 14:06:15 -------------------------------------------------------------------------------- Debridement Details Patient Name: Date of Service: Albert Perez, Albert Perez. 11/16/2022 1:30 PM Medical Record Number: [id number] Patient Account Number: [id number] Date of Birth/Sex: Treating RN: June 18, 1951 (72 y.o. Albert Perez Primary Care Provider: Henrine Perez Other Clinician: Referring Provider: Treating Provider/Extender: Albert Perez in Treatment: 0 Debridement Performed for Assessment: Wound #1 Right,Proximal,Lateral Lower Leg Performed By: Physician Duanne Guess, MD Debridement Type: Debridement Level of Consciousness (Pre-procedure): Awake and Alert Pre-procedure Verification/Time Out Yes - 14:28 Taken: Start Time: 14:28 Pain Control: Lidocaine 5% topical ointment Percent of Wound Bed Debrided: 100% T Area Debrided (cm): otal 13.35 Tissue and other material debrided: Non-Viable, Muscle, Slough, Slough Level: Skin/Subcutaneous Tissue/Muscle Debridement Description: Excisional Instrument: Curette Bleeding: Minimum Hemostasis Achieved: Pressure Response to Treatment: Procedure was tolerated well Level of Consciousness  (Post- Awake and Alert procedure): Post Debridement Measurements of Total Wound Length: (cm) 5 Width: (cm) 3.4 Depth: (cm) 0.1 Volume: (cm) 1.335 Character of Wound/Ulcer Post Debridement: Improved Post Procedure Diagnosis Same as Pre-procedure Albert Perez ([id number]) [id number] Page 2 of 12 Notes scribed for Dr. Lady Gary by Samuella Bruin, RN Electronic Signature(s) Signed: 11/16/2022 3:15:53 PM By: Duanne Guess MD FACS Signed: 11/16/2022 3:35:10 PM By: Gelene Mink By: Samuella Bruin on 11/16/2022 14:28:49 -------------------------------------------------------------------------------- Debridement Details Patient Name: Date of Service: Albert Perez, Albert Perez. 11/16/2022 1:30 PM Medical Record Number: [id number] Patient Account Number: [id number] Date of Birth/Sex: Treating RN: 03/28/1951 (72 y.o. Albert Perez Primary Care Provider: Henrine Perez Other Clinician: Referring Provider: Treating Provider/Extender: Albert Perez in Treatment: 0 Debridement Performed for Assessment: Wound #2 Right,Lateral Lower Leg Performed By: Physician Duanne Guess, MD Debridement Type: Debridement Level of Consciousness (Pre-procedure): Awake and Alert Pre-procedure Verification/Time Out Yes - 14:28 Taken: Start Time: 14:28 Pain Control: Lidocaine 5% topical ointment Percent of Wound Bed Debrided: 100% T Area Debrided (cm): otal 4.32 Tissue and other material debrided: Non-Viable, Eschar, Slough, Slough Level: Non-Viable Tissue Debridement Description: Selective/Open Wound Instrument: Curette Bleeding: Minimum Hemostasis Achieved: Pressure Response to Treatment: Procedure was tolerated well Level of Consciousness (Post- Awake and Alert procedure): Post Debridement Measurements of Total Wound Length: (cm) 5 Width: (cm) 1.1 Depth: (cm) 0.1 Volume: (cm) 0.432 Character of Wound/Ulcer Post Debridement:  Improved Post Procedure Diagnosis Same as Pre-procedure Notes scribed for Dr. Lady Gary by Samuella Bruin, RN Electronic Signature(s) Signed: 11/16/2022 3:15:53 PM By: Duanne Guess MD FACS Signed: 11/16/2022 3:35:10 PM By: Samuella Bruin Entered By: Samuella Bruin on 11/16/2022 14:45:36 -------------------------------------------------------------------------------- Debridement Details Patient Name: Date of Service: Albert Perez, Albert Perez. 11/16/2022 1:30 PM Medical Record Number: [id number] Patient Account Number: [id number] Timberman, Albert Perez ([id number]) [id number] Page 3 of 12 Date of Birth/Sex: Treating RN: [birth year] (72 y.o. Albert Perez Primary Care Provider: Other Clinician: Henrine Perez Referring Provider: Treating Provider/Extender: Albert Perez in Treatment: 0 Debridement Performed for Assessment: Wound #4 Right,Lateral Ankle Performed By: Physician  Duanne Guess, MD Debridement Type: Debridement Level of Consciousness (Pre-procedure): Awake and Alert Pre-procedure Verification/Time Out Yes - 14:28 Taken: Start Time: 14:28 Pain Control: Lidocaine 5% topical ointment Percent of Wound Bed Debrided: 100% T Area Debrided (cm): otal 0.38 Tissue and other material debrided: Non-Viable, Slough, Slough Level: Non-Viable Tissue Debridement Description: Selective/Open Wound Instrument: Curette Bleeding: Minimum Hemostasis Achieved: Pressure Response to Treatment: Procedure was tolerated well Level of Consciousness (Post- Awake and Alert procedure): Post Debridement Measurements of Total Wound Length: (cm) 0.7 Width: (cm) 0.7 Depth: (cm) 0.1 Volume: (cm) 0.038 Character of Wound/Ulcer Post Debridement: Improved Post Procedure Diagnosis Same as Pre-procedure Notes scribed for Dr. Lady Gary by Samuella Bruin, RN Electronic Signature(s) Signed: 11/16/2022 3:15:53 PM By: Duanne Guess MD FACS Signed:  11/16/2022 3:35:10 PM By: Samuella Bruin Entered By: Samuella Bruin on 11/16/2022 14:46:10 -------------------------------------------------------------------------------- HPI Details Patient Name: Date of Service: Albert Perez, Albert Perez. 11/16/2022 1:30 PM Medical Record Number: [id number] Patient Account Number: [id number] Date of Birth/Sex: Treating RN: 1951/03/12 (72 y.o. 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 He is currently receiving targeted small molecule therapy. He has developed wounds on the bony prominences of his right lower leg, as this surface is some direct pressure due to the way his leg lies. His home health nurse was applying mupirocin and T with Kerlix and Ace wrap. She was concerned that it was starting to look infected and so he was seen in the emergency room in mid April. elfa They gave him a course of Bactrim. Looking at the wounds in the electronic medical record, nothing appears infected at that time, but today the wounds do look much better. Most recently, they have been painting the wound with Betadine on the advice of a new home health nurse that recently took over his care. Electronic Signature(s) Signed: 11/16/2022 2:33:02 PM By: Duanne Guess MD FACS Entered By: Duanne Guess on 11/16/2022 14:33:02 Covel, Christofer Perez ([id number]) [id number] Page 4 of 12 -------------------------------------------------------------------------------- Physical Exam Details Patient Name: Date of Service: Albert Perez, Albert Perez. 11/16/2022 1:30 PM Medical Record Number: [id number] Patient Account Number: [id number] Date of Birth/Sex: Treating RN: October 05, 1950 (72 y.o. M) Primary Care Provider:  Henrine Perez Other Clinician: Referring Provider: Treating Provider/Extender: Albert Perez in Treatment: 0 Constitutional Hypotensive, asymptomatic. . . . Appears chronically ill but in no acute distress. Respiratory Normal work of breathing on room air. Notes 11/16/2022: On his fibular head, there is a pressure ulcer with exposed muscle. The muscle is nonviable. Further down the leg, there is another pressure wound that extends at least into the fat layer but is unstageable due to a layer of eschar overlying the surface. A third wound is distal to this but appears superficial and limited to breakdown of skin. 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 Electronic Signature(s) Signed: 11/16/2022 2:34:56 PM By: Duanne Guess MD FACS Entered By: Duanne Guess on 11/16/2022 14:34:56 -------------------------------------------------------------------------------- Physician Orders Details Patient Name: Date of Service: Albert Perez, Albert Perez. 11/16/2022 1:30 PM Medical Record Number: [id number] Patient Account Number: [id number] Date of Birth/Sex: Treating RN: [birth year] (72 y.o. 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 - Dr. Lady Gary - room 2 Anesthetic (In clinic) Topical Lidocaine 5% applied to wound bed Bathing/ Shower/ Hygiene May shower and wash wound with soap and  water. Edema Control - Lymphedema / SCD / Other Moisturize legs daily. GIOVONI, HEFFERON ([id number]) [id number] Page 5 of 12 Off-Loading Prevalon Boot - to right leg Other: - keep pressure off of lateral side of the leg Home Health New wound care orders this week; continue Home Health for wound care. May utilize formulary equivalent dressing for wound treatment orders unless otherwise specified. - use medihoney until Santyl is available Other Home Health Orders/Instructions: - [facility] Wound #1 - Lower Leg Wound Laterality: Right, Lateral, Proximal Cleanser: Soap and Water Discharge Instructions: May shower and wash wound with dial antibacterial soap and water prior to dressing change. Cleanser: Wound Cleanser Discharge Instructions: Cleanse the wound with wound cleanser prior to applying a clean dressing using gauze sponges, not tissue or cotton balls. Prim Dressing: Santyl Ointment ary Discharge Instructions: Apply nickel thick amount to wound bed as instructed Secondary Dressing: T Non-adherent Dressing, 2x3 in elfa Discharge Instructions: Apply over primary dressing as directed. Secured With: Elastic Bandage 4 inch (ACE bandage) Discharge Instructions: Secure with ACE bandage as directed. Secured With: [employer], 4.5x3.1 (in/yd) Discharge Instructions: Secure with Kerlix as directed. Wound #2 - Lower Leg Wound Laterality: Right, Lateral Cleanser: Soap and Water Discharge Instructions: May shower and wash wound with dial antibacterial soap and water prior to dressing change. Cleanser: Wound Cleanser Discharge Instructions: Cleanse the wound with wound cleanser prior to applying a clean dressing using gauze sponges, not tissue or cotton balls. Prim Dressing: Santyl Ointment ary Discharge Instructions: Apply nickel thick amount to wound bed as instructed Secondary Dressing: T Non-adherent Dressing, 2x3 in elfa Discharge  Instructions: Apply over primary dressing as directed. Secured With: Elastic Bandage 4 inch (ACE bandage) Discharge Instructions: Secure with ACE bandage as directed. Secured With: [employer], 4.5x3.1 (in/yd) Discharge Instructions: Secure with Kerlix as directed. Wound #3 - Lower Leg Wound Laterality: Right, Lateral, Distal Cleanser: Soap and Water Discharge Instructions: May shower and wash wound with dial antibacterial soap and water prior to dressing change. Cleanser: Wound Cleanser Discharge Instructions: Cleanse the wound with wound cleanser prior to applying a clean dressing using gauze sponges, not tissue or cotton balls. Prim Dressing: Santyl Ointment ary Discharge Instructions: Apply nickel thick amount to wound bed as instructed Secondary Dressing: T Non-adherent Dressing, 2x3 in elfa Discharge Instructions: Apply over primary dressing as directed. Secured With: Elastic Bandage 4 inch (ACE bandage) Discharge Instructions: Secure with ACE bandage as directed. Secured With: [employer], 4.5x3.1 (in/yd) Discharge Instructions: Secure with Kerlix as directed. Wound #4 - Ankle Wound Laterality: Right, Lateral Cleanser: Soap and Water Discharge Instructions: May shower and wash wound with dial antibacterial soap and water prior to dressing change. Cleanser: Wound Cleanser Discharge Instructions: Cleanse the wound with wound cleanser prior to applying a clean dressing using gauze sponges, not tissue or cotton balls. Prim Dressing: Santyl Ointment ary Discharge Instructions: Apply nickel thick amount to wound bed as instructed Volpe, Doyne Perez ([id number]) [id number] Page 6 of 12 Secondary Dressing:  T Non-adherent Dressing, 2x3 in elfa Discharge Instructions: Apply over primary dressing as directed. Secured With: Elastic Bandage 4 inch (ACE bandage) Discharge Instructions: Secure with ACE bandage as directed. Secured With: [employer], 4.5x3.1 (in/yd) Discharge Instructions: Secure with Kerlix as directed. Patient Medications llergies: No Known Allergies A Notifications Medication Indication Start End 11/16/2022 lidocaine DOSE topical 5 % ointment - ointment topical 11/16/2022 Santyl DOSE topical 250 unit/gram ointment - Apply nickel thick layer to each wound surface daily with dressing changes Electronic Signature(s) Signed: 11/16/2022 3:59:52 PM By: Samuella Bruin Signed: 11/17/2022 7:27:32 AM By: Duanne Guess MD FACS Previous Signature: 11/16/2022 3:15:53 PM Version By: Duanne Guess MD FACS Previous Signature: 11/16/2022 2:36:53 PM Version By: Duanne Guess MD FACS Entered By: Samuella Bruin on 11/16/2022 15:42:13 -------------------------------------------------------------------------------- Problem List Details Patient Name: Date of Service: Albert Perez, Albert Perez. 11/16/2022 1:30 PM Medical Record Number: [id number] Patient Account Number: [id number] Date of Birth/Sex: Treating RN: 05/04/1951 (72 y.o. 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, stage 3 11/16/2022 No Yes L89.893 Pressure ulcer of other site, stage 3 11/16/2022 No Yes C34.92 Malignant neoplasm of unspecified part of left bronchus or lung 11/16/2022 No Yes C79.51 Secondary malignant neoplasm of bone 11/16/2022 No Yes G89.3 Neoplasm related pain (acute) (chronic) 11/16/2022 No Yes Z92.21 Personal history of antineoplastic chemotherapy 11/16/2022 No Yes Pasko, Riot Perez ([id number]) [id number] Page 7 of 12 Inactive Problems Resolved Problems Electronic Signature(s) Signed: 11/16/2022 2:01:06 PM By: Duanne Guess MD FACS Previous Signature: 11/16/2022 1:52:58 PM Version By:  Duanne Guess MD FACS Entered By: Duanne Guess on 11/16/2022 14:01:05 -------------------------------------------------------------------------------- Progress Note Details Patient Name: Date of Service: Albert Perez, Albert Perez. 11/16/2022 1:30 PM Medical Record Number: [id number] Patient Account Number: [id number] Date of Birth/Sex: Treating RN: Mar 06, 1951 (72 y.o. 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 He is currently receiving targeted small molecule therapy. He has developed wounds on the bony prominences of his right lower leg, as this surface is some direct pressure due to the way his leg lies. His home health nurse was applying mupirocin and T with Kerlix and Ace wrap. She was concerned that it was starting to look infected and so he was seen in the emergency room in mid April. elfa They gave him a course of Bactrim. Looking at the wounds in the electronic medical record, nothing appears infected at that time, but today the wounds do look much better. Most recently, they have been painting the wound with Betadine on the advice of a new home health nurse that recently took over his care. Patient History Allergies No Known Allergies Family History Cancer - Father,Siblings, Diabetes - Siblings, Lung Disease - Siblings, No family history of Heart Disease, Hereditary Spherocytosis, Kidney Disease, Seizures, Stroke, Thyroid Problems, Tuberculosis. Social History Current every day smoker - x2 a day, Marital Status - Married, Alcohol Use - Never, Drug Use - No History, Caffeine Use - Rarely. Medical  History Respiratory Patient has history of Sleep Apnea Cardiovascular Patient has history of Arrhythmia - a-fib, Deep  Vein Thrombosis, Hypertension Musculoskeletal Patient has history of Osteoarthritis Oncologic Patient has history of Received Radiation Hospitalization/Surgery History - Inguinal hernia repair (Bilateral). - Video bronchoscopy with endobronchial navigation. - melanoma removal. - Colonoscopy. - Ankle fracture surgery. - Retinal detachment surgery. - Elbow surgery. - T onsillectomy. Medical A Surgical History Notes nd Ear/Nose/Mouth/Throat vertigo Gastrointestinal GERD Neurologic stroke Review of Systems (ROS) Constitutional Symptoms (General Health) Denies complaints or symptoms of Fatigue, Fever, Chills, Marked Weight Change. Eyes Complains or has symptoms of Glasses / Contacts. Ear/Nose/Mouth/Throat Denies complaints or symptoms of Chronic sinus problems or rhinitis. Endocrine Olano, Santino Perez ([id number]) [id number] Page 8 of 12 Denies complaints or symptoms of Heat/cold intolerance. Genitourinary Denies complaints or symptoms of Frequent urination. Integumentary (Skin) Complains or has symptoms of Wounds. Oncologic Lung cancer metastatic to bone Psychiatric Denies complaints or symptoms of Claustrophobia. Objective Constitutional Hypotensive, asymptomatic. Appears chronically ill but in no acute distress. Vitals Time Taken: 1:48 PM, Temperature: 97.7 F, Pulse: 67 bpm, Respiratory Rate: 18 breaths/min, Blood Pressure: 96/66 mmHg. Respiratory Normal work of breathing on room air. General Notes: 11/16/2022: On his fibular head, there is a pressure ulcer with exposed muscle. The muscle is nonviable. Further down the leg, there is another pressure wound that extends at least into the fat layer but is unstageable due to a layer of eschar overlying the surface. A third wound is distal to this but appears superficial and limited to  breakdown of skin. 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 Integumentary (Hair, Skin) Wound #1 status is Open. Original cause of wound was Gradually Appeared. The date acquired was: 10/04/2022. The wound is located on the Right,Proximal,Lateral Lower Leg. The wound measures 5cm length x 3.4cm width x 0.1cm depth; 13.352cm^2 area and 1.335cm^3 volume. There is Fat Layer (Subcutaneous Tissue) exposed. There is no tunneling or undermining noted. There is a medium amount of serosanguineous drainage noted. The wound margin is distinct with the outline attached to the wound base. There is large (67-100%) red, hyper - granulation within the wound bed. There is a small (1-33%) amount of necrotic tissue within the wound bed. The periwound skin appearance had no abnormalities noted for texture. The periwound skin appearance had no abnormalities noted for moisture. The periwound skin appearance exhibited: Hemosiderin Staining. Periwound temperature was noted as No Abnormality. Wound #2 status is Open. Original cause of wound was Gradually Appeared. The date acquired was: 10/04/2022. The wound is located on the Right,Lateral Lower Leg. The wound measures 5cm length x 1.1cm width x 0.1cm depth; 4.32cm^2 area and 0.432cm^3 volume. There is Fat Layer (Subcutaneous Tissue) exposed. There is no tunneling or undermining noted. There is a medium amount of serosanguineous drainage noted. The wound margin is distinct with the outline attached to the wound base. There is large (67-100%) red, pink granulation within the wound bed. There is a small (1-33%) amount of necrotic tissue within the wound bed including Eschar and Adherent Slough. The periwound skin appearance had no abnormalities noted for texture. The periwound skin appearance had no abnormalities noted for moisture. The periwound skin appearance exhibited: Hemosiderin Staining. Periwound temperature was noted as No Abnormality. Wound #3 status  is Open. Original cause of wound was Gradually Appeared. The date acquired was: 10/04/2022. The wound is located on the Right,Distal,Lateral Lower Leg. The wound measures 0.5cm length x 0.7cm width x 0.1cm depth; 0.275cm^2 area and 0.027cm^3 volume. There is Fat Layer (Subcutaneous Tissue) exposed. There is no tunneling or undermining  noted. There is a medium amount of serosanguineous drainage noted. The wound margin is distinct with the outline attached to the wound base. There is large (67-100%) red granulation within the wound bed. There is a small (1-33%) amount of necrotic tissue within the wound bed including Eschar. The periwound skin appearance had no abnormalities noted for texture. The periwound skin appearance had no abnormalities noted for moisture. The periwound skin appearance exhibited: Hemosiderin Staining. Periwound temperature was noted as No Abnormality. Wound #4 status is Open. Original cause of wound was Gradually Appeared. The date acquired was: 10/04/2022. The wound is located on the Right,Lateral Ankle. The wound measures 0.7cm length x 0.7cm width x 0.1cm depth; 0.385cm^2 area and 0.038cm^3 volume. There is Fat Layer (Subcutaneous Tissue) exposed. There is no tunneling or undermining noted. There is a medium amount of serosanguineous drainage noted. The wound margin is distinct with the outline attached to the wound base. There is large (67-100%) red granulation within the wound bed. There is a small (1-33%) amount of necrotic tissue within the wound bed including Adherent Slough. The periwound skin appearance had no abnormalities noted for texture. The periwound skin appearance had no abnormalities noted for moisture. The periwound skin appearance exhibited: Hemosiderin Staining. Periwound temperature was noted as No Abnormality. Assessment Active Problems ICD-10 Pressure ulcer of right ankle, stage 3 Pressure ulcer of other site, stage 3 Malignant neoplasm of unspecified part  of left bronchus or lung Secondary malignant neoplasm of bone Neoplasm related pain (acute) (chronic) Personal history of antineoplastic chemotherapy Procedures Wound #1 Pre-procedure diagnosis of Wound #1 is a T be determined located on the Right,Proximal,Lateral Lower Leg . There was a Excisional Skin/Subcutaneous o Tissue/Muscle Debridement with a total area of 13.35 sq cm performed by Duanne Guess, MD. With the following instrument(s): Curette to remove Non- Schmuck, [place] Perez ([id number]) [id number] Page 9 of 12 Viable tissue/material. Material removed includes Muscle and Slough and after achieving pain control using Lidocaine 5% topical ointment. No specimens were taken. A time out was conducted at 14:28, prior to the start of the procedure. A Minimum amount of bleeding was controlled with Pressure. The procedure was tolerated well. Post Debridement Measurements: 5cm length x 3.4cm width x 0.1cm depth; 1.335cm^3 volume. Character of Wound/Ulcer Post Debridement is improved. Post procedure Diagnosis Wound #1: Same as Pre-Procedure General Notes: scribed for Dr. Lady Gary by Samuella Bruin, RN. Wound #2 Pre-procedure diagnosis of Wound #2 is a T be determined located on the Right,Lateral Lower Leg . There was a Selective/Open Wound Non-Viable Tissue o Debridement with a total area of 4.32 sq cm performed by Duanne Guess, MD. With the following instrument(s): Curette to remove Non-Viable tissue/material. Material removed includes Eschar and Slough and after achieving pain control using Lidocaine 5% topical ointment. No specimens were taken. A time out was conducted at 14:28, prior to the start of the procedure. A Minimum amount of bleeding was controlled with Pressure. The procedure was tolerated well. Post Debridement Measurements: 5cm length x 1.1cm width x 0.1cm depth; 0.432cm^3 volume. Character of Wound/Ulcer Post Debridement is improved. Post procedure  Diagnosis Wound #2: Same as Pre-Procedure General Notes: scribed for Dr. Lady Gary by Samuella Bruin, RN. Wound #4 Pre-procedure diagnosis of Wound #4 is a T be determined located on the Right,Lateral Ankle . There was a Selective/Open Wound Non-Viable Tissue o Debridement with a total area of 0.38 sq cm performed by Duanne Guess, MD. With the following instrument(s): Curette to remove Non-Viable tissue/material. Material removed includes [place] after  achieving pain control using Lidocaine 5% topical ointment. No specimens were taken. A time out was conducted at 14:28, prior to the start of the procedure. A Minimum amount of bleeding was controlled with Pressure. The procedure was tolerated well. Post Debridement Measurements: 0.7cm length x 0.7cm width x 0.1cm depth; 0.038cm^3 volume. Character of Wound/Ulcer Post Debridement is improved. Post procedure Diagnosis Wound #4: Same as Pre-Procedure General Notes: scribed for Dr. Lady Gary by Samuella Bruin, RN. Plan Follow-up Appointments: Return appointment in 1 month. - Dr. Lady Gary - room 2 Anesthetic: (In clinic) Topical Lidocaine 5% applied to wound bed Bathing/ Shower/ Hygiene: May shower and wash wound with soap and water. Edema Control - Lymphedema / SCD / Other: Moisturize legs daily. Off-Loading: Prevalon Boot - to right leg Other: - keep pressure off of lateral side of the leg Home Health: New wound care orders this week; continue Home Health for wound care. May utilize formulary equivalent dressing for wound treatment orders unless otherwise specified. - use medihoney until Melburn Popper is available Other Home Health Orders/Instructions: - [facility] The following medication(s) was prescribed: lidocaine topical 5 % ointment ointment topical was prescribed at facility Santyl topical 250 unit/gram ointment Apply nickel thick layer to each wound surface daily with dressing changes starting 11/16/2022 WOUND #1: - Lower Leg Wound  Laterality: Right, Lateral, Proximal Cleanser: Soap and Water Discharge Instructions: May shower and wash wound with dial antibacterial soap and water prior to dressing change. Cleanser: Wound Cleanser Discharge Instructions: Cleanse the wound with wound cleanser prior to applying a clean dressing using gauze sponges, not tissue or cotton balls. Prim Dressing: Santyl Ointment ary Discharge Instructions: Apply nickel thick amount to wound bed as instructed Secondary Dressing: T Non-adherent Dressing, 2x3 in elfa Discharge Instructions: Apply over primary dressing as directed. Secured With: Elastic Bandage 4 inch (ACE bandage) Discharge Instructions: Secure with ACE bandage as directed. Secured With: [employer], 4.5x3.1 (in/yd) Discharge Instructions: Secure with Kerlix as directed. WOUND #2: - Lower Leg Wound Laterality: Right, Lateral Cleanser: Soap and Water Discharge Instructions: May shower and wash wound with dial antibacterial soap and water prior to dressing change. Cleanser: Wound Cleanser Discharge Instructions: Cleanse the wound with wound cleanser prior to applying a clean dressing using gauze sponges, not tissue or cotton balls. Prim Dressing: Santyl Ointment ary Discharge Instructions: Apply nickel thick amount to wound bed as instructed Secondary Dressing: T Non-adherent Dressing, 2x3 in elfa Discharge Instructions: Apply over primary dressing as directed. Secured With: Elastic Bandage 4 inch (ACE bandage) Discharge Instructions: Secure with ACE bandage as directed. Secured With: [employer], 4.5x3.1 (in/yd) Discharge Instructions: Secure with Kerlix as directed. WOUND #3: - Lower Leg Wound Laterality: Right, Lateral, Distal Cleanser: Soap and Water Discharge Instructions: May shower and wash wound with dial antibacterial soap and water prior to dressing change. Cleanser: Wound Cleanser Discharge Instructions: Cleanse the wound with wound cleanser prior  to applying a clean dressing using gauze sponges, not tissue or cotton balls. Prim Dressing: Santyl Ointment ary Discharge Instructions: Apply nickel thick amount to wound bed as instructed Secondary Dressing: T Non-adherent Dressing, 2x3 in elfa Discharge Instructions: Apply over primary dressing as directed. Secured With: Elastic Bandage 4 inch (ACE bandage) Discharge Instructions: Secure with ACE bandage as directed. Secured With: [employer], 4.5x3.1 (in/yd) Discharge Instructions: Secure with Kerlix as directed. WOUND #4: - Ankle Wound Laterality: Right, Lateral Bottari, Millan Perez ([id number]) [id number] Page 10 of 12 Cleanser: Soap and Water Discharge Instructions: May shower and wash wound  with dial antibacterial soap and water prior to dressing change. Cleanser: Wound Cleanser Discharge Instructions: Cleanse the wound with wound cleanser prior to applying a clean dressing using gauze sponges, not tissue or cotton balls. Prim Dressing: Santyl Ointment ary Discharge Instructions: Apply nickel thick amount to wound bed as instructed Secondary Dressing: T Non-adherent Dressing, 2x3 in elfa Discharge Instructions: Apply over primary dressing as directed. Secured With: Elastic Bandage 4 inch (ACE bandage) Discharge Instructions: Secure with ACE bandage as directed. Secured With: [employer], 4.5x3.1 (in/yd) Discharge Instructions: Secure with Kerlix as directed. 11/16/2022: On his fibular head, there is a pressure ulcer with exposed muscle. The muscle is nonviable. Further down the leg, there is another pressure wound that extends at least into the fat layer but is unstageable due to a layer of eschar overlying the surface. A third wound is distal to this but appears superficial and limited to breakdown of skin. 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 I used scissors and forceps to debride necrotic muscle from the most  proximal wound. I used a curette to debride eschar and slough from the second wound. The third wound did not require debridement. I used a curette to debride slough from the ankle wound. I think all of these wounds would benefit from continued enzymatic debridement so we are going to apply Santyl to all of the sites. His wife is changing his dressings daily and they also have the benefit of home health wound care. I have told him to stop applying any Betadine to his wounds as the tissues today were quite desiccated. While they are awaiting the Santyl, they will use Medihoney at home. We also provided a Prevalon boot to help avoid further pressure induced tissue damage to his leg. It is quite challenging for him to get out to clinic visits due to pain and immobility; he will follow-up in 1 month. Electronic Signature(s) Signed: 01/03/2023 1:49:42 PM By: Pearletha Alfred Signed: 01/03/2023 2:55:18 PM By: Duanne Guess MD FACS Previous Signature: 11/16/2022 3:59:52 PM Version By: Samuella Bruin Previous Signature: 11/17/2022 7:27:32 AM Version By: Duanne Guess MD FACS Previous Signature: 11/16/2022 2:40:07 PM Version By: Duanne Guess MD FACS Entered By: Pearletha Alfred on 01/03/2023 13:49:42 -------------------------------------------------------------------------------- HxROS Details Patient Name: Date of Service: Albert Perez, Albert Perez. 11/16/2022 1:30 PM Medical Record Number: [id number] Patient Account Number: [id number] Date of Birth/Sex: Treating RN: 1950-09-18 (72 y.o. Albert Perez Primary Care Provider: Henrine Perez Other Clinician: Referring Provider: Treating Provider/Extender: Albert Perez in Treatment: 0 Constitutional Symptoms (General Health) Complaints and Symptoms: Negative for: Fatigue; Fever; Chills; Marked Weight Change Eyes Complaints and Symptoms: Positive for: Glasses / Contacts Ear/Nose/Mouth/Throat Complaints and Symptoms: Negative  for: Chronic sinus problems or rhinitis Medical History: Past Medical History Notes: vertigo Endocrine Complaints and Symptoms: Negative for: Heat/cold intolerance Genitourinary Complaints and Symptoms: Negative for: Frequent urination Owensby, Ramesses Perez ([id number]) [id number] Page 11 of 12 Integumentary (Skin) Complaints and Symptoms: Positive for: Wounds Psychiatric Complaints and Symptoms: Negative for: Claustrophobia Respiratory Medical History: Positive for: Sleep Apnea Cardiovascular Medical History: Positive for: Arrhythmia - a-fib; Deep Vein Thrombosis; Hypertension Gastrointestinal Medical History: Past Medical History Notes: GERD Immunological Musculoskeletal Medical History: Positive for: Osteoarthritis Neurologic Medical History: Past Medical History Notes: stroke Oncologic Complaints and Symptoms: Review of System 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 Medical History: Positive for: Received Radiation Immunizations Pneumococcal Vaccine: Received Pneumococcal Vaccination: No Implantable Devices No devices added Hospitalization / Surgery  History Type of Hospitalization/Surgery Inguinal hernia repair (Bilateral) Video bronchoscopy with endobronchial navigation melanoma removal Colonoscopy Ankle fracture surgery Retinal detachment surgery Elbow surgery Tonsillectomy Family and Social History Cancer: Yes - Father,Siblings; Diabetes: Yes - Siblings; Heart Disease: No; Hereditary Spherocytosis: No; Kidney Disease: No; Lung Disease: Yes - Siblings; Seizures: No; Stroke: No; Thyroid Problems: No; Tuberculosis: No; Current every day smoker - x2 a day; Marital Status - Married; Alcohol Use: Never; Drug Use: No History; Caffeine Use: Rarely; Financial Concerns: No; Food, Clothing or Shelter Needs: No; Support System Lacking: No; Transportation Concerns: No Electronic Signature(s) Signed: 11/16/2022 3:15:53 PM By: Duanne Guess MD  FACS Signed: 11/16/2022 3:35:10 PM By: Oneida Arenas, Nadim Perez ([id number]) [id number] Page 12 of 12 Signed: 11/16/2022 3:35:10 PM By: Gelene Mink By: Samuella Bruin on 11/16/2022 13:50:54 -------------------------------------------------------------------------------- SuperBill Details Patient Name: Date of Service: Albert Perez, Albert Perez. 11/16/2022 Medical Record Number: [id number] Patient Account Number: [id number] Date of Birth/Sex: Treating RN: 07-07-1950 (72 y.o. 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) Z92.21 Personal history of antineoplastic chemotherapy Facility Procedures : CPT4 Code: [id number] Description: 99213 - WOUND CARE VISIT-LEV 3 EST PT Modifier: 25 Quantity: 1 : CPT4 Code: [id number] Description: 11043 - DEB MUSC/FASCIA 20 SQ CM/< ICD-10 Diagnosis Description L89.893 Pressure ulcer of other site, stage 3 Modifier: Quantity: 1 : CPT4 Code: [id number] Description: 97597 - DEBRIDE WOUND 1ST 20 SQ CM OR < ICD-10 Diagnosis Description L89.893 Pressure ulcer of other site, stage 3 L89.513 Pressure ulcer of right ankle, stage 3 Modifier: Quantity: 1 Physician Procedures : CPT4 Code Description Modifier [id number] 99204 - WC PHYS LEVEL 4 - NEW PT 25 ICD-10 Diagnosis Description L89.513 Pressure ulcer of right ankle, stage 3 L89.893 Pressure ulcer of other site, stage 3 C79.51 Secondary malignant neoplasm of bone Z92.21  Personal history of antineoplastic chemotherapy Quantity: 1 : [id number] 11043 - WC PHYS DEBR  MUSCLE/FASCIA 20 SQ CM ICD-10 Diagnosis Description L89.893 Pressure ulcer of other site, stage 3 Quantity: 1 : [id number] 97597 - WC PHYS DEBR WO ANESTH 20 SQ CM ICD-10 Diagnosis Description L89.893 Pressure ulcer of other site, stage 3 L89.513 Pressure ulcer of right ankle, stage 3 Quantity: 1 Electronic Signature(s) Signed: 11/16/2022 3:15:53 PM By: Duanne Guess MD FACS Signed: 11/16/2022 3:35:10 PM By: Samuella Bruin Previous Signature: 11/16/2022 2:40:46 PM Version By: Duanne Guess MD FACS Entered By: Samuella Bruin on 11/16/2022 14:50:26

## 2023-01-04 ENCOUNTER — Encounter (HOSPITAL_COMMUNITY): Payer: Self-pay

## 2023-01-04 ENCOUNTER — Inpatient Hospital Stay (HOSPITAL_COMMUNITY)
Admission: EM | Admit: 2023-01-04 | Discharge: 2023-01-12 | DRG: 811 | Disposition: A | Discharge status: Hospice | Payer: Medicare Other | Attending: Internal Medicine | Admitting: Internal Medicine

## 2023-01-04 ENCOUNTER — Other Ambulatory Visit (HOSPITAL_COMMUNITY): Payer: Self-pay

## 2023-01-04 ENCOUNTER — Ambulatory Visit (HOSPITAL_COMMUNITY)
Admission: RE | Admit: 2023-01-04 | Discharge: 2023-01-04 | Disposition: A | Discharge status: Home / Self Care | Payer: Medicare Other | Source: Ambulatory Visit | Attending: Internal Medicine | Admitting: Internal Medicine

## 2023-01-04 ENCOUNTER — Other Ambulatory Visit: Payer: Self-pay

## 2023-01-04 ENCOUNTER — Telehealth: Payer: Self-pay | Admitting: Medical Oncology

## 2023-01-04 ENCOUNTER — Emergency Department (HOSPITAL_COMMUNITY): Payer: Medicare Other

## 2023-01-04 DIAGNOSIS — L89152 Pressure ulcer of sacral region, stage 2: Secondary | ICD-10-CM | POA: Diagnosis not present

## 2023-01-04 DIAGNOSIS — I959 Hypotension, unspecified: Secondary | ICD-10-CM | POA: Diagnosis present

## 2023-01-04 DIAGNOSIS — E871 Hypo-osmolality and hyponatremia: Secondary | ICD-10-CM | POA: Diagnosis not present

## 2023-01-04 DIAGNOSIS — I4891 Unspecified atrial fibrillation: Secondary | ICD-10-CM | POA: Diagnosis not present

## 2023-01-04 DIAGNOSIS — R627 Adult failure to thrive: Secondary | ICD-10-CM | POA: Diagnosis present

## 2023-01-04 DIAGNOSIS — J869 Pyothorax without fistula: Secondary | ICD-10-CM | POA: Diagnosis present

## 2023-01-04 DIAGNOSIS — Z515 Encounter for palliative care: Secondary | ICD-10-CM | POA: Diagnosis not present

## 2023-01-04 DIAGNOSIS — C3490 Malignant neoplasm of unspecified part of unspecified bronchus or lung: Secondary | ICD-10-CM | POA: Diagnosis present

## 2023-01-04 DIAGNOSIS — R531 Weakness: Secondary | ICD-10-CM | POA: Diagnosis not present

## 2023-01-04 DIAGNOSIS — G9340 Encephalopathy, unspecified: Secondary | ICD-10-CM | POA: Diagnosis not present

## 2023-01-04 DIAGNOSIS — Z743 Need for continuous supervision: Secondary | ICD-10-CM | POA: Diagnosis not present

## 2023-01-04 DIAGNOSIS — D6832 Hemorrhagic disorder due to extrinsic circulating anticoagulants: Secondary | ICD-10-CM | POA: Diagnosis present

## 2023-01-04 DIAGNOSIS — D849 Immunodeficiency, unspecified: Secondary | ICD-10-CM | POA: Diagnosis present

## 2023-01-04 DIAGNOSIS — I2699 Other pulmonary embolism without acute cor pulmonale: Secondary | ICD-10-CM | POA: Diagnosis present

## 2023-01-04 DIAGNOSIS — R64 Cachexia: Secondary | ICD-10-CM | POA: Diagnosis not present

## 2023-01-04 DIAGNOSIS — R6889 Other general symptoms and signs: Secondary | ICD-10-CM | POA: Diagnosis not present

## 2023-01-04 DIAGNOSIS — Z85118 Personal history of other malignant neoplasm of bronchus and lung: Secondary | ICD-10-CM

## 2023-01-04 DIAGNOSIS — Z923 Personal history of irradiation: Secondary | ICD-10-CM

## 2023-01-04 DIAGNOSIS — C7951 Secondary malignant neoplasm of bone: Secondary | ICD-10-CM | POA: Diagnosis present

## 2023-01-04 DIAGNOSIS — Z7401 Bed confinement status: Secondary | ICD-10-CM

## 2023-01-04 DIAGNOSIS — Z5982 Transportation insecurity: Secondary | ICD-10-CM

## 2023-01-04 DIAGNOSIS — I700 Atherosclerosis of aorta: Secondary | ICD-10-CM | POA: Diagnosis not present

## 2023-01-04 DIAGNOSIS — C3492 Malignant neoplasm of unspecified part of left bronchus or lung: Secondary | ICD-10-CM | POA: Diagnosis not present

## 2023-01-04 DIAGNOSIS — D62 Acute posthemorrhagic anemia: Principal | ICD-10-CM | POA: Diagnosis present

## 2023-01-04 DIAGNOSIS — L89893 Pressure ulcer of other site, stage 3: Secondary | ICD-10-CM | POA: Diagnosis not present

## 2023-01-04 DIAGNOSIS — M6259 Muscle wasting and atrophy, not elsewhere classified, multiple sites: Secondary | ICD-10-CM | POA: Diagnosis present

## 2023-01-04 DIAGNOSIS — D649 Anemia, unspecified: Secondary | ICD-10-CM

## 2023-01-04 DIAGNOSIS — G893 Neoplasm related pain (acute) (chronic): Secondary | ICD-10-CM | POA: Diagnosis not present

## 2023-01-04 DIAGNOSIS — M25551 Pain in right hip: Secondary | ICD-10-CM | POA: Diagnosis present

## 2023-01-04 DIAGNOSIS — Z66 Do not resuscitate: Secondary | ICD-10-CM | POA: Diagnosis present

## 2023-01-04 DIAGNOSIS — Z86711 Personal history of pulmonary embolism: Secondary | ICD-10-CM

## 2023-01-04 DIAGNOSIS — R4589 Other symptoms and signs involving emotional state: Secondary | ICD-10-CM

## 2023-01-04 DIAGNOSIS — R54 Age-related physical debility: Secondary | ICD-10-CM | POA: Diagnosis present

## 2023-01-04 DIAGNOSIS — E43 Unspecified severe protein-calorie malnutrition: Secondary | ICD-10-CM | POA: Diagnosis not present

## 2023-01-04 DIAGNOSIS — Z7189 Other specified counseling: Secondary | ICD-10-CM

## 2023-01-04 DIAGNOSIS — E785 Hyperlipidemia, unspecified: Secondary | ICD-10-CM | POA: Diagnosis present

## 2023-01-04 DIAGNOSIS — J90 Pleural effusion, not elsewhere classified: Secondary | ICD-10-CM | POA: Diagnosis not present

## 2023-01-04 DIAGNOSIS — G4730 Sleep apnea, unspecified: Secondary | ICD-10-CM | POA: Diagnosis present

## 2023-01-04 DIAGNOSIS — Z86718 Personal history of other venous thrombosis and embolism: Secondary | ICD-10-CM

## 2023-01-04 DIAGNOSIS — L8990 Pressure ulcer of unspecified site, unspecified stage: Secondary | ICD-10-CM | POA: Diagnosis not present

## 2023-01-04 DIAGNOSIS — Z79899 Other long term (current) drug therapy: Secondary | ICD-10-CM | POA: Diagnosis not present

## 2023-01-04 DIAGNOSIS — I10 Essential (primary) hypertension: Secondary | ICD-10-CM | POA: Diagnosis present

## 2023-01-04 DIAGNOSIS — Z681 Body mass index (BMI) 19 or less, adult: Secondary | ICD-10-CM

## 2023-01-04 DIAGNOSIS — K219 Gastro-esophageal reflux disease without esophagitis: Secondary | ICD-10-CM | POA: Diagnosis present

## 2023-01-04 DIAGNOSIS — Z85820 Personal history of malignant melanoma of skin: Secondary | ICD-10-CM

## 2023-01-04 DIAGNOSIS — F17210 Nicotine dependence, cigarettes, uncomplicated: Secondary | ICD-10-CM | POA: Diagnosis present

## 2023-01-04 DIAGNOSIS — D638 Anemia in other chronic diseases classified elsewhere: Secondary | ICD-10-CM | POA: Diagnosis present

## 2023-01-04 DIAGNOSIS — Z8679 Personal history of other diseases of the circulatory system: Secondary | ICD-10-CM

## 2023-01-04 DIAGNOSIS — M84422A Pathological fracture, left humerus, initial encounter for fracture: Secondary | ICD-10-CM | POA: Diagnosis present

## 2023-01-04 DIAGNOSIS — R918 Other nonspecific abnormal finding of lung field: Secondary | ICD-10-CM | POA: Diagnosis not present

## 2023-01-04 DIAGNOSIS — Z720 Tobacco use: Secondary | ICD-10-CM | POA: Diagnosis present

## 2023-01-04 DIAGNOSIS — R1310 Dysphagia, unspecified: Secondary | ICD-10-CM | POA: Diagnosis present

## 2023-01-04 DIAGNOSIS — Z1152 Encounter for screening for COVID-19: Secondary | ICD-10-CM

## 2023-01-04 DIAGNOSIS — G4733 Obstructive sleep apnea (adult) (pediatric): Secondary | ICD-10-CM | POA: Diagnosis present

## 2023-01-04 DIAGNOSIS — Z7901 Long term (current) use of anticoagulants: Secondary | ICD-10-CM

## 2023-01-04 DIAGNOSIS — Z8673 Personal history of transient ischemic attack (TIA), and cerebral infarction without residual deficits: Secondary | ICD-10-CM

## 2023-01-04 LAB — CBC WITH DIFFERENTIAL/PLATELET
Abs Immature Granulocytes: 0.08 10*3/uL — ABNORMAL HIGH (ref 0.00–0.07)
Basophils Absolute: 0 10*3/uL (ref 0.0–0.1)
Basophils Relative: 0 %
Eosinophils Absolute: 0 10*3/uL (ref 0.0–0.5)
Eosinophils Relative: 0 %
HCT: 17.8 % — ABNORMAL LOW (ref 39.0–52.0)
Hemoglobin: 5.4 g/dL — CL (ref 13.0–17.0)
Immature Granulocytes: 1 %
Lymphocytes Relative: 8 %
Lymphs Abs: 0.9 10*3/uL (ref 0.7–4.0)
MCH: 29.5 pg (ref 26.0–34.0)
MCHC: 30.3 g/dL (ref 30.0–36.0)
MCV: 97.3 fL (ref 80.0–100.0)
Monocytes Absolute: 0.9 10*3/uL (ref 0.1–1.0)
Monocytes Relative: 9 %
Neutro Abs: 8.8 10*3/uL — ABNORMAL HIGH (ref 1.7–7.7)
Neutrophils Relative %: 82 %
Platelets: 354 10*3/uL (ref 150–400)
RBC: 1.83 MIL/uL — ABNORMAL LOW (ref 4.22–5.81)
RDW: 16.8 % — ABNORMAL HIGH (ref 11.5–15.5)
WBC: 10.7 10*3/uL — ABNORMAL HIGH (ref 4.0–10.5)
nRBC: 0 % (ref 0.0–0.2)

## 2023-01-04 LAB — RESP PANEL BY RT-PCR (RSV, FLU A&B, COVID)  RVPGX2
Influenza A by PCR: NEGATIVE
Influenza B by PCR: NEGATIVE
Resp Syncytial Virus by PCR: NEGATIVE
SARS Coronavirus 2 by RT PCR: NEGATIVE

## 2023-01-04 LAB — COMPREHENSIVE METABOLIC PANEL
ALT: 14 U/L (ref 0–44)
AST: 12 U/L — ABNORMAL LOW (ref 15–41)
Albumin: <1.5 g/dL — ABNORMAL LOW (ref 3.5–5.0)
Alkaline Phosphatase: 132 U/L — ABNORMAL HIGH (ref 38–126)
Anion gap: 5 (ref 5–15)
BUN: 25 mg/dL — ABNORMAL HIGH (ref 8–23)
CO2: 26 mmol/L (ref 22–32)
Calcium: 7.7 mg/dL — ABNORMAL LOW (ref 8.9–10.3)
Chloride: 104 mmol/L (ref 98–111)
Creatinine, Ser: 0.8 mg/dL (ref 0.61–1.24)
GFR, Estimated: >60 mL/min (ref >60)
Glucose, Bld: 128 mg/dL — ABNORMAL HIGH (ref 70–99)
Potassium: 4.1 mmol/L (ref 3.5–5.1)
Sodium: 135 mmol/L (ref 135–145)
Total Bilirubin: 0.4 mg/dL (ref 0.3–1.2)
Total Protein: 5 g/dL — ABNORMAL LOW (ref 6.5–8.1)

## 2023-01-04 LAB — TYPE AND SCREEN
ABO/RH(D): A POS
Unit division: 0

## 2023-01-04 LAB — POC OCCULT BLOOD, ED: Fecal Occult Bld: NEGATIVE

## 2023-01-04 LAB — LACTIC ACID, PLASMA
Lactic Acid, Venous: 1.2 mmol/L (ref 0.5–1.9)
Lactic Acid, Venous: 2.4 mmol/L (ref 0.5–1.9)

## 2023-01-04 LAB — BPAM RBC: Unit Type and Rh: 6200

## 2023-01-04 LAB — PROTIME-INR
INR: 2.4 — ABNORMAL HIGH (ref 0.8–1.2)
Prothrombin Time: 26.6 seconds — ABNORMAL HIGH (ref 11.4–15.2)

## 2023-01-04 LAB — PREPARE RBC (CROSSMATCH)

## 2023-01-04 LAB — ABO/RH: ABO/RH(D): A POS

## 2023-01-04 LAB — APTT: aPTT: 63 seconds — ABNORMAL HIGH (ref 24–36)

## 2023-01-04 LAB — CULTURE, BLOOD (ROUTINE X 2)

## 2023-01-04 MED ORDER — OSIMERTINIB MESYLATE 80 MG PO TABS
80.0000 mg | ORAL_TABLET | Freq: Every day | ORAL | Status: DC
  Administered 2023-01-05 – 2023-01-09 (×5): 80 mg via ORAL

## 2023-01-04 MED ORDER — ONDANSETRON HCL 4 MG/2ML IJ SOLN: 4.0000 mg | Freq: Four times a day (QID) | INTRAMUSCULAR | Status: DC | PRN

## 2023-01-04 MED ORDER — SODIUM CHLORIDE 0.9 % IV SOLN
1.0000 g | Freq: Once | INTRAVENOUS | Status: AC
  Administered 2023-01-04: 1 g via INTRAVENOUS
  Filled 2023-01-04: qty 10

## 2023-01-04 MED ORDER — SODIUM CHLORIDE 0.9% IV SOLUTION: Freq: Once | INTRAVENOUS | Status: AC

## 2023-01-04 MED ORDER — LACTATED RINGERS IV BOLUS
500.0000 mL | Freq: Once | INTRAVENOUS | Status: AC
  Administered 2023-01-04: 500 mL via INTRAVENOUS

## 2023-01-04 MED ORDER — SODIUM CHLORIDE 0.9 % IV BOLUS (SEPSIS)
1000.0000 mL | Freq: Once | INTRAVENOUS | Status: AC
  Administered 2023-01-04: 1000 mL via INTRAVENOUS

## 2023-01-04 MED ORDER — BACITRACIN 500 UNIT/GM EX OINT
1.0000 | TOPICAL_OINTMENT | Freq: Two times a day (BID) | CUTANEOUS | Status: DC
  Administered 2023-01-04 – 2023-01-11 (×14): 1 via TOPICAL
  Filled 2023-01-04 (×2): qty 0.9
  Filled 2023-01-04 (×2): qty 14
  Filled 2023-01-04 (×2): qty 0.9
  Filled 2023-01-04 (×2): qty 14
  Filled 2023-01-04: qty 0.9

## 2023-01-04 MED ORDER — ACETAMINOPHEN 325 MG PO TABS
650.0000 mg | ORAL_TABLET | Freq: Four times a day (QID) | ORAL | Status: DC | PRN
  Administered 2023-01-04 – 2023-01-05 (×2): 650 mg via ORAL
  Filled 2023-01-04 (×2): qty 2

## 2023-01-04 MED ORDER — ACETAMINOPHEN 650 MG RE SUPP: 650.0000 mg | Freq: Four times a day (QID) | RECTAL | Status: DC | PRN

## 2023-01-04 MED ORDER — VANCOMYCIN HCL IN DEXTROSE 1-5 GM/200ML-% IV SOLN
1000.0000 mg | Freq: Once | INTRAVENOUS | Status: AC
  Administered 2023-01-04: 1000 mg via INTRAVENOUS
  Filled 2023-01-04: qty 200

## 2023-01-04 MED ORDER — FENTANYL CITRATE PF 50 MCG/ML IJ SOSY
12.5000 ug | PREFILLED_SYRINGE | INTRAMUSCULAR | Status: AC | PRN
  Administered 2023-01-04 (×2): 12.5 ug via INTRAVENOUS
  Filled 2023-01-04 (×2): qty 1

## 2023-01-04 MED ORDER — LACTATED RINGERS IV SOLN: INTRAVENOUS | Status: AC

## 2023-01-04 MED ORDER — CEFTRIAXONE SODIUM 1 G IJ SOLR
1.0000 g | Freq: Once | INTRAMUSCULAR | Status: DC
  Filled 2023-01-04: qty 10

## 2023-01-04 MED ORDER — ROSUVASTATIN CALCIUM 5 MG PO TABS
5.0000 mg | ORAL_TABLET | Freq: Every day | ORAL | Status: DC
  Administered 2023-01-05 – 2023-01-09 (×5): 5 mg via ORAL
  Filled 2023-01-04 (×5): qty 1

## 2023-01-04 MED ORDER — ONDANSETRON HCL 4 MG PO TABS: 4.0000 mg | ORAL_TABLET | Freq: Four times a day (QID) | ORAL | Status: DC | PRN

## 2023-01-04 MED ORDER — VITAMIN B-12 1000 MCG PO TABS
1000.0000 ug | ORAL_TABLET | Freq: Every day | ORAL | Status: DC
  Administered 2023-01-05 – 2023-01-09 (×5): 1000 ug via ORAL
  Filled 2023-01-04 (×5): qty 1

## 2023-01-04 NOTE — ED Triage Notes (Signed)
EMS Reported patient has a decreased appetite. Hypotensive.  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 And shoulder pain at this time.   82/38

## 2023-01-04 NOTE — ED Notes (Signed)
ED TO INPATIENT HANDOFF REPORT  Name/Age/Gender Albert Perez 72 y.o. male  Code Status    Code Status Orders  (From admission, onward)           Start     Ordered   01/04/23 1854  Full code  Continuous       Question:  By:  Answer:  Consent: discussion documented in EHR   01/04/23 1854           Code Status History     This patient has a current code status but no historical code status.       Home/SNF/Other Home  Chief Complaint Symptomatic anemia [D64.9]  Level of Care/Admitting Diagnosis ED Disposition     ED Disposition  Admit   Condition  --   Comment  Hospital Area: [facility] [[id number]]  Level of Care: Progressive [102]  Admit to Progressive based on following criteria: MULTISYSTEM THREATS such as stable sepsis, metabolic/electrolyte imbalance with or without encephalopathy that is responding to early treatment.  May place patient in observation at [facility] or [facility] if equivalent level of care is available:: No  Covid Evaluation: Asymptomatic - no recent exposure (last 10 days) testing not required  Diagnosis: Symptomatic anemia [1610960]  Admitting Physician: Bobette Mo [[id number]]  Attending Physician: Bobette Mo [[id number]]          Medical History Past Medical History:  Diagnosis Date   A-fib [facility])    PMH: 2000-2001   Arthritis    Cancer (HCC)    melanoma 2018   Diarrhea    due to medications   DVT (deep venous thrombosis) (HCC)    Left brachial vein DVT (01/30/19, following fracture)   Dysrhythmia    afib -    ETOH abuse    GERD (gastroesophageal reflux disease)    ocassional   History of radiation therapy    Right Hip 02/08/22-02/19/22-Dr. Antony Blackbird   History of radiation therapy    Right Pelvis-05/06/22-05/19/22- Dr. Antony Blackbird   Lung cancer [facility]) dx'd 02/26/2019   Metastatic cancer to bone (HCC) dx'd 02/26/2019   Sleep apnea    do not wear CPAP   Stroke (HCC) 2019   " mini"    Vertigo    Wears glasses     Allergies No Known Allergies  IV Location/Drains/Wounds Patient Lines/Drains/Airways Status     Active Line/Drains/Airways     Name Placement date Placement time Site Days   Peripheral IV 07/20/22 20 G Right Antecubital 07/20/22  1818  Antecubital  168   Peripheral IV 01/04/23 Left Antecubital 01/04/23  1300  Antecubital  less than 1   Peripheral IV 01/04/23 20 G Posterior;Right Wrist 01/04/23  1707  Wrist  less than 1            Labs/Imaging Results for orders placed or performed during the hospital encounter of 01/04/23 (from the past 48 hour(s))  Lactic acid, plasma     Status: None   Collection Time: 01/04/23  2:31 PM  Result Value Ref Range   Lactic Acid, Venous 1.2 0.5 - 1.9 mmol/L    Comment: Performed at [facility], [address], [place], [place] [address]  Comprehensive metabolic panel     Status: Abnormal   Collection Time: 01/04/23  2:31 PM  Result Value Ref Range   Sodium 135 135 - 145 mmol/L   Potassium 4.1 3.5 - 5.1 mmol/L   Chloride 104 98 - 111 mmol/L  CO2 26 22 - 32 mmol/L   Glucose, Bld 128 (H) 70 - 99 mg/dL    Comment: Glucose reference range applies only to samples taken after fasting for at least 8 hours.   BUN 25 (H) 8 - 23 mg/dL   Creatinine, Ser 1.61 0.61 - 1.24 mg/dL   Calcium 7.7 (L) 8.9 - 10.3 mg/dL   Total Protein 5.0 (L) 6.5 - 8.1 g/dL   Albumin <0.9 (L) 3.5 - 5.0 g/dL   AST 12 (L) 15 - 41 U/L   ALT 14 0 - 44 U/L   Alkaline Phosphatase 132 (H) 38 - 126 U/L   Total Bilirubin 0.4 0.3 - 1.2 mg/dL   GFR, Estimated >60 >45 mL/min    Comment: (NOTE) Calculated using the CKD-EPI Creatinine Equation (2021)    Anion gap 5 5 - 15    Comment: Performed at [facility], [address], [place], [place] [address]  CBC with Differential     Status: Abnormal   Collection Time: 01/04/23  2:31 PM  Result Value Ref Range   WBC 10.7 (H) 4.0 - 10.5 K/uL   RBC 1.83 (L) 4.22 -  5.81 MIL/uL   Hemoglobin 5.4 (LL) 13.0 - 17.0 g/dL    Comment: REPEATED TO VERIFY THIS CRITICAL RESULT HAS VERIFIED AND BEEN CALLED TO Tashaun Obey A., RN BY NATHAN THOMPSON ON 07 02 2024 AT 1530, AND HAS BEEN READ BACK. CRITICAL RESULT VERIFIED    HCT 17.8 (L) 39.0 - 52.0 %   MCV 97.3 80.0 - 100.0 fL   MCH 29.5 26.0 - 34.0 pg   MCHC 30.3 30.0 - 36.0 g/dL   RDW 19.1 (H) 47.8 - 29.5 %   Platelets 354 150 - 400 K/uL   nRBC 0.0 0.0 - 0.2 %   Neutrophils Relative % 82 %   Neutro Abs 8.8 (H) 1.7 - 7.7 K/uL   Lymphocytes Relative 8 %   Lymphs Abs 0.9 0.7 - 4.0 K/uL   Monocytes Relative 9 %   Monocytes Absolute 0.9 0.1 - 1.0 K/uL   Eosinophils Relative 0 %   Eosinophils Absolute 0.0 0.0 - 0.5 K/uL   Basophils Relative 0 %   Basophils Absolute 0.0 0.0 - 0.1 K/uL   Immature Granulocytes 1 %   Abs Immature Granulocytes 0.08 (H) 0.00 - 0.07 K/uL    Comment: Performed at [facility], [address], [place], [place] [address]  Protime-INR     Status: Abnormal   Collection Time: 01/04/23  2:31 PM  Result Value Ref Range   Prothrombin Time 26.6 (H) 11.4 - 15.2 seconds   INR 2.4 (H) 0.8 - 1.2    Comment: (NOTE) INR goal varies based on device and disease states. Performed at [facility], [address], [place], [place] [address]   APTT     Status: Abnormal   Collection Time: 01/04/23  2:31 PM  Result Value Ref Range   aPTT 63 (H) 24 - 36 seconds    Comment:        IF BASELINE aPTT IS ELEVATED, SUGGEST PATIENT RISK ASSESSMENT BE USED TO DETERMINE APPROPRIATE ANTICOAGULANT THERAPY. Performed at [facility], [address], [place], [place] [address]   Blood Culture (routine x 2)     Status: None (Preliminary result)   Collection Time: 01/04/23  2:31 PM   Specimen: BLOOD LEFT FOREARM  Result Value Ref Range   Specimen Description      BLOOD  LEFT FOREARM Performed at [facility], [address] [place], [place]  [address]    Special Requests      BOTTLES DRAWN AEROBIC AND ANAEROBIC Blood Culture results may not be optimal due to an excessive volume of blood received in culture bottles Performed at [facility], [address], [place], [place] [address]    Culture PENDING    Report Status PENDING   ABO/Rh     Status: None   Collection Time: 01/04/23  2:31 PM  Result Value Ref Range   ABO/RH(D)      A POS Performed at [facility], [address] [place], [place] [address]   Blood Culture (routine x 2)     Status: None (Preliminary result)   Collection Time: 01/04/23  2:38 PM   Specimen: BLOOD RIGHT WRIST  Result Value Ref Range   Specimen Description      BLOOD RIGHT WRIST Performed at [facility], [address], [place], [place] [address]    Special Requests      BOTTLES DRAWN AEROBIC ONLY Blood Culture results may not be optimal due to an inadequate volume of blood received in culture bottles Performed at [facility], [address] [place], [place] [address]    Culture PENDING    Report Status PENDING   Resp panel by RT-PCR (RSV, Flu A&B, Covid) Anterior Nasal Swab     Status: None   Collection Time: 01/04/23  2:43 PM   Specimen: Anterior Nasal Swab  Result Value Ref Range   SARS Coronavirus 2 by RT PCR NEGATIVE NEGATIVE    Comment: (NOTE) SARS-CoV-2 target nucleic acids are NOT DETECTED.  The SARS-CoV-2 RNA is generally detectable in upper respiratory specimens during the acute phase of infection. The lowest concentration of SARS-CoV-2 viral copies this assay can detect is 138 copies/mL. A negative result does not preclude SARS-Cov-2 infection and should not be used as the sole basis for treatment or other patient management decisions. A negative result may occur with  improper specimen collection/handling, submission of specimen other than nasopharyngeal swab, presence of viral mutation(s) within the areas  targeted by this assay, and inadequate number of viral copies(<138 copies/mL). A negative result must be combined with clinical observations, patient history, and epidemiological information. The expected result is Negative.  Fact Sheet for Patients:  BloggerCourse.com  Fact Sheet for Healthcare Providers:  SeriousBroker.it  This test is no t yet approved or cleared by the [place] FDA and  has been authorized for detection and/or diagnosis of SARS-CoV-2 by FDA under an Emergency Use Authorization (EUA). This EUA will remain  in effect (meaning this test can be used) for the duration of the COVID-19 declaration under Section 564(b)(1) of the Act, 21 U.S.C.section 360bbb-3(b)(1), unless the authorization is terminated  or revoked sooner.       Influenza A by PCR NEGATIVE NEGATIVE   Influenza B by PCR NEGATIVE NEGATIVE    Comment: (NOTE) The Xpert Xpress SARS-CoV-2/FLU/RSV plus assay is intended as an aid in the diagnosis of influenza from Nasopharyngeal swab specimens and should not be used as a sole basis for treatment. Nasal washings and aspirates are unacceptable for Xpert Xpress SARS-CoV-2/FLU/RSV testing.  Fact Sheet for Patients: BloggerCourse.com  Fact Sheet for Healthcare Providers: SeriousBroker.it  This test is not yet approved or cleared by the [place] FDA and has been authorized for detection and/or diagnosis of SARS-CoV-2 by FDA under an Emergency Use  Authorization (EUA). This EUA will remain in effect (meaning this test can be used) for the duration of the COVID-19 declaration under Section 564(b)(1) of the Act, 21 U.S.C. section 360bbb-3(b)(1), unless the authorization is terminated or revoked.     Resp Syncytial Virus by PCR NEGATIVE NEGATIVE    Comment: (NOTE) Fact Sheet for Patients: BloggerCourse.com  Fact Sheet for  Healthcare Providers: SeriousBroker.it  This test is not yet approved or cleared by the [place] FDA and has been authorized for detection and/or diagnosis of SARS-CoV-2 by FDA under an Emergency Use Authorization (EUA). This EUA will remain in effect (meaning this test can be used) for the duration of the COVID-19 declaration under Section 564(b)(1) of the Act, 21 U.S.C. section 360bbb-3(b)(1), unless the authorization is terminated or revoked.  Performed at [facility], [address], [place], [place] [address]   Prepare RBC (crossmatch)     Status: None   Collection Time: 01/04/23  3:39 PM  Result Value Ref Range   Order Confirmation      ORDER PROCESSED BY BLOOD BANK Performed at [facility], [address], [place], [place] [address]   Type and screen [facility]     Status: None (Preliminary result)   Collection Time: 01/04/23  4:02 PM  Result Value Ref Range   ABO/RH(D) A POS    Antibody Screen NEG    Sample Expiration      01/07/2023,2359 Performed at [facility], [address], [place], [place] [address]    Unit Number [id number]    Blood Component Type RED CELLS,LR    Unit division 00    Status of Unit ALLOCATED    Transfusion Status OK TO TRANSFUSE    Crossmatch Result Compatible    Unit Number [id number]    Blood Component Type RBC LR PHER2    Unit division 00    Status of Unit ALLOCATED    Transfusion Status OK TO TRANSFUSE    Crossmatch Result Compatible   POC occult blood, ED     Status: None   Collection Time: 01/04/23  5:21 PM  Result Value Ref Range   Fecal Occult Bld NEGATIVE NEGATIVE   DG Chest Port 1 View  Result Date: 01/04/2023 CLINICAL DATA:  Decreased appetite, hypotensive EXAM: PORTABLE CHEST 1 VIEW COMPARISON:  03/28/2019 chest radiograph, correlation is also made with 10/26/2022 CT chest FINDINGS: Unchanged cardiac and  mediastinal contours, with aortic atherosclerosis. Layering right pleural effusion, likely moderate, with associated atelectasis. No focal opacity in the left lung. No pneumothorax. No acute osseous abnormality. IMPRESSION: Layering right pleural effusion, likely moderate, with associated atelectasis. Electronically Signed   By: Wiliam Ke M.D.   On: 01/04/2023 16:00    Pending Labs Unresulted Labs (From admission, onward)     Start     Ordered   01/05/23 0500  CBC  Tomorrow morning,   R        01/04/23 1854   01/05/23 0500  Comprehensive metabolic panel  Tomorrow morning,   R        01/04/23 1854   01/04/23 1710  Occult blood card to lab, stool  ONCE - STAT,   STAT        01/04/23 1709   01/04/23 1419  Lactic acid, plasma  (Septic presentation on arrival (screening labs, nursing and treatment orders for obvious sepsis))  STAT Now then every 2 hours,   R (with STAT occurrences)  01/04/23 1420   01/04/23 1419  Urinalysis, w/ Reflex to Culture (Infection Suspected) -Urine, Catheterized  (Septic presentation on arrival (screening labs, nursing and treatment orders for obvious sepsis))  Once,   URGENT       Question:  Specimen Source  Answer:  Urine, Catheterized   01/04/23 1420            Vitals/Pain Today's Vitals   01/04/23 1700 01/04/23 1701 01/04/23 1715 01/04/23 1826  BP: (!) 102/56  103/66   Pulse: (!) 58 62 (!) 57   Resp: 17 (!) 23 (!) 21   Temp:    98.2 F (36.8 C)  TempSrc:    Oral  SpO2: (!) 89% 93% 91%   Weight:      Height:      PainSc:        Isolation Precautions No active isolations  Medications Medications  lactated ringers infusion ( Intravenous New Bag/Given 01/04/23 1452)  acetaminophen (TYLENOL) tablet 650 mg (has no administration in time range)    Or  acetaminophen (TYLENOL) suppository 650 mg (has no administration in time range)  fentaNYL (SUBLIMAZE) injection 12.5 mcg (has no administration in time range)  ondansetron (ZOFRAN) tablet 4 mg  (has no administration in time range)    Or  ondansetron (ZOFRAN) injection 4 mg (has no administration in time range)  bacitracin ointment 1 Application (has no administration in time range)  osimertinib mesylate (TAGRISSO) tablet 80 mg (has no administration in time range)  rosuvastatin (CRESTOR) tablet 5 mg (has no administration in time range)  cyanocobalamin (VITAMIN B12) tablet 1,000 mcg (has no administration in time range)  sodium chloride 0.9 % bolus 1,000 mL (1,000 mLs Intravenous New Bag/Given 01/04/23 1456)  vancomycin (VANCOCIN) IVPB 1000 mg/200 mL premix (1,000 mg Intravenous New Bag/Given 01/04/23 1600)  cefTRIAXone (ROCEPHIN) 1 g in sodium chloride 0.9 % 100 mL IVPB (1 g Intravenous New Bag/Given (Non-Interop) 01/04/23 1457)  0.9 %  sodium chloride infusion (Manually program via Guardrails IV Fluids) ( Intravenous New Bag/Given 01/04/23 1629)  lactated ringers bolus 500 mL (500 mLs Intravenous New Bag/Given 01/04/23 1618)    Mobility non-ambulatory

## 2023-01-04 NOTE — H&P (Addendum)
History and Physical    Patient: Albert Perez WGN:[id number] DOB: 13-May-1951 DOA: 01/04/2023 DOS: the patient was seen and examined on 01/04/2023 PCP: Henrine Screws, MD  Patient coming from: Home  Chief Complaint:  Chief Complaint  Patient presents with   Hypotension   HPI: Albert Perez is a 72 y.o. male with medical history significant of atrial fibrillation on apixaban, osteoarthritis, melanoma, medication induced diarrhea, DVT, EtOH abuse, GERD, history of metastatic lung cancer, history of radiation therapy to right pelvis and right hip, sleep apnea not on CPAP, history of TIA, vertigo who presented to the emergency department with hypotension, decreased appetite and multiple wounds that are oozing blood, particularly his right upper back 1.  He has been lightheaded.  No hematemesis, melena, hematochezia or hematuria.  He also complained of left shoulder and right hip pain. He denied fever, chills, rhinorrhea, sore throat, wheezing or hemoptysis.  No chest pain, palpitations, diaphoresis, PND, orthopnea or pitting edema of the lower extremities.  No abdominal pain, nausea, emesis, diarrhea or constipation.  No flank pain, dysuria or frequency..  No polyuria, polydipsia, polyphagia or blurred vision.   Lab work: CBC showed a white count of 10.7, hemoglobin 5.4 g/dL platelets 784.  PT 26.6, INR 2.4 and PTT 63.  CMP showed normal electrolytes after calcium correction, glucose 129, BUN 25 and creatinine 0.80 mg/dL.  Total protein is 5.0 and albumin less than 1.5 g/dL.  Alkaline phosphatase 132, AST 12 and ALT 14 units/L.  Lactic acid is 1.2 mmol/L.  Coronavirus, influenza and RSV PCR was negative.  Imaging: Portable 1 view ches showed layering right pleural effusion, likely moderate, without associated atelectasis.  ED course: Initial vital signs were temperature 97.5 F, pulse 70, respirations 18, BP 126/70 mmHg O2 sat 100% on room air.  Review of Systems: As mentioned in the history of present  illness. All other systems reviewed and are negative. Past Medical History:  Diagnosis Date   A-fib [facility])    PMH: 2000-2001   Arthritis    Cancer (HCC)    melanoma 2018   Diarrhea    due to medications   DVT (deep venous thrombosis) (HCC)    Left brachial vein DVT (01/30/19, following fracture)   Dysrhythmia    afib -    ETOH abuse    GERD (gastroesophageal reflux disease)    ocassional   History of radiation therapy    Right Hip 02/08/22-02/19/22-Dr. Antony Blackbird   History of radiation therapy    Right Pelvis-05/06/22-05/19/22- Dr. Antony Blackbird   Lung cancer [facility]) dx'd 02/26/2019   Metastatic cancer to bone (HCC) dx'd 02/26/2019   Sleep apnea    do not wear CPAP   Stroke (HCC) 2019   " mini"   Vertigo    Wears glasses    Past Surgical History:  Procedure Laterality Date   ANKLE FRACTURE SURGERY Right 2003   COLONOSCOPY  2018   ELBOW SURGERY Left 1964   INGUINAL HERNIA REPAIR Bilateral 11/12/2020   Procedure: BILATERAL OPEN INGUINAL HERNIA REPAIR WITH MESH;  Surgeon: Abigail Miyamoto, MD;  Location: [facility] OR;  Service: General;  Laterality: Bilateral;   melanoma removal  04/2017   Lt shoulder   RETINAL DETACHMENT SURGERY Right 1971   TONSILLECTOMY     VIDEO BRONCHOSCOPY WITH ENDOBRONCHIAL NAVIGATION N/A 03/28/2019   Procedure: VIDEO BRONCHOSCOPY WITH ENDOBRONCHIAL NAVIGATION and Biopsies;  Surgeon: Leslye Peer, MD;  Location: [facility] OR;  Service: Thoracic;  Laterality: N/A;   Social History:  reports that he has been smoking cigarettes. He has a 7.50 pack-year smoking history. He has never used smokeless tobacco. He reports current alcohol use of about 2.0 standard drinks of alcohol per week. He reports current drug use. Drug: Marijuana.  No Known Allergies  Family History  Problem Relation Age of Onset   Aneurysm Mother    Colon cancer Brother    Prostate cancer Brother     Prior to Admission medications   Medication Sig Start Date End Date Taking? Authorizing  Provider  apixaban (ELIQUIS) 5 MG TABS tablet Take 1 tablet (5 mg total) by mouth 2 (two) times daily. 12/01/22  Yes Si Gaul, MD  bacitracin ointment Apply 1 Application topically 2 (two) times daily. 10/19/22  Yes Theresia Lo, Turkey K, DO  carvedilol (COREG) 6.25 MG tablet TAKE 1 TABLET BY MOUTH TWICE A DAY 09/20/22  Yes Camnitz, Will Daphine Deutscher, MD  Cholecalciferol (VITAMIN D-3) 25 MCG (1000 UT) CAPS Take 1,000 Units by mouth daily.   Yes [provider]  loperamide (IMODIUM) 2 MG capsule Take 2 mg by mouth as needed for diarrhea or loose stools.   Yes [provider]  osimertinib mesylate (TAGRISSO) 80 MG tablet Take 1 tablet (80 mg total) by mouth daily. 10/27/22  Yes Si Gaul, MD  oxyCODONE-acetaminophen (PERCOCET) 7.5-325 MG tablet Take 1-2 tablets by mouth every 6 (six) hours as needed for severe pain. 12/20/22  Yes Walisiewicz, Kaitlyn E, PA-C  furosemide (LASIX) 20 MG tablet TAKE 1 TABLET BY MOUTH EVERY DAY 11/17/22   Runell Gess, MD  potassium chloride (KLOR-CON) 10 MEQ tablet Take 1 tablet (10 mEq total) by mouth daily. 06/23/22 12/20/22  Marjie Skiff E, PA-C  rosuvastatin (CRESTOR) 5 MG tablet Take 5 mg by mouth daily. 04/26/18   [provider]  tepotinib hcl (TEPMETKO) 225 MG tablet Take 1 tablet (225 mg total) by mouth daily. Take as directed 12/17/22   Si Gaul, MD  torsemide (DEMADEX) 20 MG tablet TAKE 1 TABLET BY MOUTH TWICE A DAY 08/27/22   Cannon Kettle, PA-C  Triamcinolone Acetonide (TRIAMCINOLONE 0.1 % CREAM : EUCERIN) CREA Apply 1 Application topically 2 (two) times daily. 07/20/22   Derwood Kaplan, MD  TURMERIC PO Take 1 capsule by mouth daily.    [provider]  vitamin B-12 (CYANOCOBALAMIN) 1000 MCG tablet Take 1,000 mcg by mouth daily.    [provider]    Physical Exam: Vitals:   01/04/23 1356 01/04/23 1500  BP: (!) 90/55 (!) 96/51  Pulse: 73 69  Resp: 14 13  Temp: 98.3 F (36.8 C)    TempSrc: Oral   SpO2: 91% 91%  Weight: 56.8 kg   Height: 5\' 7"  (1.702 m)    Physical Exam Vitals and nursing note reviewed.  Constitutional:      Appearance: Normal appearance.  HENT:     Head: Normocephalic.     Mouth/Throat:     Mouth: Mucous membranes are moist.  Eyes:     General: No scleral icterus.    Pupils: Pupils are equal, round, and reactive to light.  Cardiovascular:     Rate and Rhythm: Normal rate and regular rhythm.  Pulmonary:     Effort: Pulmonary effort is normal.     Breath sounds: Normal breath sounds.  Abdominal:     General: Bowel sounds are normal. There is no distension.     Palpations: Abdomen is soft.     Tenderness: There is no abdominal tenderness. There is no  guarding.  Musculoskeletal:     Cervical back: Neck supple.     Right lower leg: No edema.     Left lower leg: No edema.  Skin:    General: Skin is warm and dry.     Coloration: Skin is pale.     Comments: Right upper back pressure wound that is actively oozing blood.  Upper right anterolateral tibial area wound with no significant bleeding, right ankle area wound that is mildly oozing blood and sacral area, which is not bleeding.  Please see images below.  Neurological:     General: No focal deficit present.     Mental Status: He is alert and oriented to person, place, and time.  Psychiatric:        Mood and Affect: Mood normal.        Behavior: Behavior normal.    Right upper back area.   Upper right tibial area.   Right ankle area.   Sacral area.   Data Reviewed:  Results are pending, will review when available.  07/23/2022 transthoracic echocardiogram IMPRESSIONS:   1. Left ventricular ejection fraction, by estimation, is 50 to 55%. The  left ventricle has low normal function. The left ventricle has no regional  wall motion abnormalities. Left ventricular diastolic parameters are  indeterminate.   2. Right ventricular systolic function is normal. The right  ventricular  size is normal.   3. The mitral valve is abnormal. Mild mitral valve regurgitation. No  evidence of mitral stenosis.   4. The aortic valve is tricuspid. Aortic valve regurgitation is not  visualized. No aortic stenosis is present.   5. The inferior vena cava is normal in size with greater than 50%  respiratory variability, suggesting right atrial pressure of 3 mmHg.   Assessment and Plan: Principal Problem:   Symptomatic anemia Secondary to oozing wounds. History of malignancy/chemotherapy. Transfused 2 units of PRBC. Hold DOAC for now. Monitor hematocrit and hemoglobin. Transfuse further as needed.  Active Problems:   History of atrial fibrillation CHA2DS2-VASc Score of at least 5. Hold apixaban due to current skin bleeding. Resume carvedilol once BP rises.    Essential hypertension Hold antihypertensives due to soft BP readings.    Severe obstructive sleep apnea-hypopnea syndrome CPAP at bedtime.    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 Follow-up with Dr. Shirline Frees scheduled.    Tobacco abuse Nicotine replacement therapy as needed.    Hyperlipidemia Continue rosuvastatin 5 mg p.o. daily.    Pulmonary emboli (HCC) Holding apixaban for now.     Advance Care Planning:   Code Status: Full Code   Consults:   Family Communication:   Severity of Illness: The appropriate patient status for this patient is INPATIENT. Inpatient status is judged to be reasonable and necessary in order to provide the required intensity of service to ensure the patient's safety. The patient's presenting symptoms, physical exam findings, and initial radiographic and laboratory data in the context of their chronic comorbidities is felt to place them at high risk for further clinical deterioration. Furthermore, it is not anticipated that the patient will be medically stable for discharge from the  hospital within 2 midnights of admission.   * I certify that at the point of admission it is my clinical judgment that the patient will require inpatient hospital care spanning beyond 2 midnights from the point of admission due to high intensity of service, high risk  for further deterioration and high frequency of surveillance required.*  Author: Bobette Mo, MD 01/04/2023 4:40 PM  For on call review [email or web address].   This document was prepared using Dragon voice recognition software and may contain some unintended transcription errors.

## 2023-01-04 NOTE — Progress Notes (Signed)
Robards, Rani L ([id number]) [id number] Page 1 of 11 Visit Report for 11/16/2022 Allergy List Details Patient Name: Date of Service: Albert Perez, Albert Perez 11/16/2022 1:30 PM Medical Record Number: [id number] Patient Account Number: [id number] Date of Birth/Sex: Treating RN: [birth year] (72 y.o. Marlan Palau Primary Care Paton Crum: Henrine Screws Other Clinician: Referring Icy Fuhrmann: Treating Larinda Herter/Extender: Modesto Charon Weeks in Treatment: 0 Allergies Active Allergies No Known Allergies Allergy Notes Electronic Signature(s) Signed: 11/16/2022 3:35:10 PM By: Samuella Bruin Entered By: Samuella Bruin on 11/16/2022 13:48:42 -------------------------------------------------------------------------------- Arrival Information Details Patient Name: Date of Service: Albert Perez, Albert L. 11/16/2022 1:30 PM Medical Record Number: [id number] Patient Account Number: [id number] Date of Birth/Sex: Treating RN: 1950-11-04 (72 y.o. Marlan Palau Primary Care Hettie Roselli: Henrine Screws Other Clinician: Referring Ayla Dunigan: Treating Breyana Follansbee/Extender: Gerald Leitz in Treatment: 0 Visit Information Patient Arrived: Wheel Chair Arrival Time: 13:27 Accompanied By: wife Transfer Assistance: Manual Patient Identification Verified: Yes Secondary Verification Process Completed: Yes Patient Has Alerts: Yes Patient Alerts: Patient on Blood Thinner Electronic Signature(s) Signed: 11/16/2022 3:35:10 PM By: Samuella Bruin Entered By: Samuella Bruin on 11/16/2022 13:46:41 -------------------------------------------------------------------------------- Clinic Level of Care Assessment Details Patient Name: Date of Service: Albert Perez, Albert MOUNGER. 11/16/2022 1:30 PM Medical Record Number: [id number] Patient Account Number: [id number] Albert Perez, Albert Perez ([id number]) [id number] Page 2 of 11 Date of Birth/Sex:  Treating RN: 1951-01-13 (72 y.o. Marlan Palau Primary Care Elizah Mierzwa: Other Clinician: Henrine Screws Referring Leslie Langille: Treating Karter Hellmer/Extender: Gerald Leitz in Treatment: 0 Clinic Level of Care Assessment Items TOOL 1 Quantity Score X- 1 0 Use when EandM and Procedure is performed on INITIAL visit ASSESSMENTS - Nursing Assessment / Reassessment X- 1 20 General Physical Exam (combine w/ comprehensive assessment (listed just below) when performed on new pt. evals) X- 1 25 Comprehensive Assessment (HX, ROS, Risk Assessments, Wounds Hx, etc.) ASSESSMENTS - Wound and Skin Assessment / Reassessment []  - 0 Dermatologic / Skin Assessment (not related to wound area) ASSESSMENTS - Ostomy and/or Continence Assessment and Care []  - 0 Incontinence Assessment and Management []  - 0 Ostomy Care Assessment and Management (repouching, etc.) PROCESS - Coordination of Care X - Simple Patient / Family Education for ongoing care 1 15 []  - 0 Complex (extensive) Patient / Family Education for ongoing care X- 1 10 Staff obtains Chiropractor, Records, T Results / Process Orders est []  - 0 Staff telephones HHA, Nursing Homes / Clarify orders / etc []  - 0 Routine Transfer to another Facility (non-emergent condition) []  - 0 Routine Hospital Admission (non-emergent condition) X- 1 15 New Admissions / Manufacturing engineer / Ordering NPWT Apligraf, etc. , []  - 0 Emergency Hospital Admission (emergent condition) PROCESS - Special Needs []  - 0 Pediatric / Minor Patient Management []  - 0 Isolation Patient Management []  - 0 Hearing / Language / Visual special needs []  - 0 Assessment of Community assistance (transportation, D/C planning, etc.) []  - 0 Additional assistance / Altered mentation []  - 0 Support Surface(s) Assessment (bed, cushion, seat, etc.) INTERVENTIONS - Miscellaneous []  - 0 External ear exam []  - 0 Patient Transfer (multiple staff /  Nurse, adult / Similar devices) []  - 0 Simple Staple / Suture removal (25 or less) []  - 0 Complex Staple / Suture removal (26 or more) []  - 0 Hypo/Hyperglycemic Management (do not check if billed separately) []  - 0 Ankle / Brachial Index (ABI) - do not check if billed separately Has the patient been seen at the hospital within the last  three years: Yes Total Score: 85 Level Of Care: New/Established - Level 3 Electronic Signature(s) Signed: 11/16/2022 3:35:10 PM By: Samuella Bruin Entered By: Samuella Bruin on 11/16/2022 14:50:15 Gilbo, Nicoles L ([id number]) [id number] Page 3 of 11 -------------------------------------------------------------------------------- Encounter Discharge Information Details Patient Name: Date of Service: Albert Perez, Albert Perez 11/16/2022 1:30 PM Medical Record Number: [id number] Patient Account Number: [id number] Date of Birth/Sex: Treating RN: 1951-02-28 (72 y.o. Marlan Palau Primary Care Celestine Prim: Henrine Screws Other Clinician: Referring Irvin Bastin: Treating Jentry Warnell/Extender: Gerald Leitz in Treatment: 0 Encounter Discharge Information Items Post Procedure Vitals Discharge Condition: Stable Temperature (F): 97.7 Ambulatory Status: Wheelchair Pulse (bpm): 67 Discharge Destination: Home Respiratory Rate (breaths/min): 18 Transportation: Private Auto Blood Pressure (mmHg): 96/66 Accompanied By: wife Schedule Follow-up Appointment: Yes Clinical Summary of Care: Patient Declined Electronic Signature(s) Signed: 11/16/2022 3:35:10 PM By: Samuella Bruin Entered By: Samuella Bruin on 11/16/2022 14:50:57 -------------------------------------------------------------------------------- Lower Extremity Assessment Details Patient Name: Date of Service: Albert Perez, Albert L. 11/16/2022 1:30 PM Medical Record Number: [id number] Patient Account Number: [id number] Date of Birth/Sex: Treating  RN: Dec 16, 1950 (73 y.o. Marlan Palau Primary Care Branden Shallenberger: Henrine Screws Other Clinician: Referring Brandie Lopes: Treating Carman Essick/Extender: Modesto Charon Weeks in Treatment: 0 Edema Assessment Assessed: [Left: No] [Right: No] [Left: Edema] [Right: :] Calf Left: Right: Point of Measurement: From Medial Instep 24.3 cm Ankle Left: Right: Point of Measurement: From Medial Instep 18.5 cm Vascular Assessment Pulses: Dorsalis Pedis Palpable: [Right:Yes] Electronic Signature(s) Signed: 11/16/2022 3:35:10 PM By: Samuella Bruin Entered By: Samuella Bruin on 11/16/2022 13:53:39 Multi Wound Chart Details -------------------------------------------------------------------------------- Albert Perez ([id number]) [id number] Page 4 of 11 Patient Name: Date of Service: Albert Perez, Albert Perez 11/16/2022 1:30 PM Medical Record Number: [id number] Patient Account Number: [id number] Date of Birth/Sex: Treating RN: [birth year] (72 y.o. M) Primary Care Shawnia Vizcarrondo: Henrine Screws Other Clinician: Referring Charitie Hinote: Treating Marquise Lambson/Extender: Gerald Leitz in Treatment: 0 Vital Signs Height(in): Pulse(bpm): 67 Weight(lbs): Blood Pressure(mmHg): 96/66 Body Mass Index(BMI): Temperature(F): 97.7 Respiratory Rate(breaths/min): 18 Wound Assessments Wound Number: 1 2 3  Photos: Right, Proximal, Lateral Lower Leg Right, Lateral Lower Leg Right, Distal, Lateral Lower Leg Wound Location: Gradually Appeared Gradually Appeared Gradually Appeared Wounding Event: T be determined o T be determined o T be determined o Primary Etiology: Sleep Apnea, Arrhythmia, Deep Vein Sleep Apnea, Arrhythmia, Deep Vein Sleep Apnea, Arrhythmia, Deep Vein Comorbid History: Thrombosis, Hypertension, Thrombosis, Hypertension, Thrombosis, Hypertension, Osteoarthritis, Received Radiation Osteoarthritis, Received Radiation Osteoarthritis,  Received Radiation 10/04/2022 10/04/2022 10/04/2022 Date Acquired: 0 0 0 Weeks of Treatment: Open Open Open Wound Status: No No No Wound Recurrence: 5x3.4x0.1 5x1.1x0.1 0.5x0.7x0.1 Measurements L x W x D (cm) 13.352 4.32 0.275 A (cm) : rea 1.335 0.432 0.027 Volume (cm) : Full Thickness Without Exposed Full Thickness Without Exposed Full Thickness Without Exposed Classification: Support Structures Support Structures Support Structures Medium Medium Medium Exudate A mount: Serosanguineous Serosanguineous Serosanguineous Exudate Type: red, brown red, brown red, brown Exudate Color: Distinct, outline attached Distinct, outline attached Distinct, outline attached Wound Margin: Large (67-100%) Large (67-100%) Large (67-100%) Granulation Amount: Red, Hyper-granulation Red, Pink Red Granulation Quality: Small (1-33%) Small (1-33%) Small (1-33%) Necrotic Amount: N/A Eschar, Adherent Slough Eschar Necrotic Tissue: Fat Layer (Subcutaneous Tissue): Yes Fat Layer (Subcutaneous Tissue): Yes Fat Layer (Subcutaneous Tissue): Yes Exposed Structures: Fascia: No Fascia: No Fascia: No Tendon: No Tendon: No Tendon: No Muscle: No Muscle: No Muscle: No Joint: No Joint: No Joint: No Bone: No Bone: No Bone: No Small (1-33%) Small (1-33%) Small (  1-33%) Epithelialization: No Abnormalities Noted No Abnormalities Noted No Abnormalities Noted Periwound Skin Texture: No Abnormalities Noted No Abnormalities Noted No Abnormalities Noted Periwound Skin Moisture: Hemosiderin Staining: Yes Hemosiderin Staining: Yes Hemosiderin Staining: Yes Periwound Skin Color: No Abnormality No Abnormality No Abnormality Temperature: Wound Number: 4 N/A N/A Photos: No Photos N/A N/A Right, Lateral Ankle N/A N/A Wound Location: Gradually Appeared N/A N/A Wounding Event: T be determined o N/A N/A Primary Etiology: Sleep Apnea, Arrhythmia, Deep Vein N/A N/A Comorbid History: Thrombosis,  Hypertension, Osteoarthritis, Received Radiation 10/04/2022 N/A N/A Date Acquired: 0 N/A N/A Weeks of Treatment: Open N/A N/A Wound Status: No N/A N/A Wound Recurrence: 0.7x0.7x0.1 N/A N/A Measurements L x W x D (cm) 0.385 N/A N/A A (cm) : rea 0.038 N/A N/A Volume (cm) : Full Thickness Without Exposed N/A N/A Classification: Support Structures Medium N/A N/A Exudate Amount: Serosanguineous N/A N/A Exudate Type: red, brown N/A N/A Exudate Color: Distinct, outline attached N/A N/A Wound Margin: Schill, Jaterrius L ([id number]) [id number] Page 5 of 11 Large (67-100%) N/A N/A Granulation Amount: Red N/A N/A Granulation Quality: Small (1-33%) N/A N/A Necrotic Amount: Adherent Slough N/A N/A Necrotic Tissue: Fat Layer (Subcutaneous Tissue): Yes N/A N/A Exposed Structures: Fascia: No Tendon: No Muscle: No Joint: No Bone: No Small (1-33%) N/A N/A Epithelialization: No Abnormalities Noted N/A N/A Periwound Skin Texture: No Abnormalities Noted N/A N/A Periwound Skin Moisture: Hemosiderin Staining: Yes N/A N/A Periwound Skin Color: No Abnormality N/A N/A Temperature: Treatment Notes Electronic Signature(s) Signed: 11/16/2022 2:05:55 PM By: Duanne Guess MD FACS Entered By: Duanne Guess on 11/16/2022 14:05:54 -------------------------------------------------------------------------------- Multi-Disciplinary Care Plan Details Patient Name: Date of Service: Albert Perez, Albert L. 11/16/2022 1:30 PM Medical Record Number: [id number] Patient Account Number: [id number] Date of Birth/Sex: Treating RN: 1950-11-28 (72 y.o. Marlan Palau Primary Care Levent Kornegay: Henrine Screws Other Clinician: Referring Andee Chivers: Treating Sylvania Parrella/Extender: Modesto Charon Weeks in Treatment: 0 Active Inactive Electronic Signature(s) Signed: 01/04/2023 3:05:56 PM By: Samuella Bruin Previous Signature: 11/16/2022 3:35:10 PM Version By:  Gelene Mink By: Samuella Bruin on 12/13/2022 10:58:26 -------------------------------------------------------------------------------- Pain Assessment Details Patient Name: Date of Service: Albert Perez, Albert L. 11/16/2022 1:30 PM Medical Record Number: [id number] Patient Account Number: [id number] Date of Birth/Sex: Treating RN: 23-Jul-1950 (72 y.o. Marlan Palau Primary Care Saoirse Legere: Henrine Screws Other Clinician: Referring Labella Zahradnik: Treating Cyruss Arata/Extender: Modesto Charon Weeks in Treatment: 0 Active Problems Location of Pain Severity and Description of Pain Patient Has Paino Yes Site Locations Pain Location: Spatafore, Niquan L ([id number]) [id number] Page 6 of 11 Pain Location: Generalized Pain, Pain in Ulcers Duration of the Pain. Constant / Intermittento Intermittent Rate the pain. Current Pain Level: 6 Character of Pain Describe the Pain: Aching Pain Management and Medication Current Pain Management: Medication: Yes Electronic Signature(s) Signed: 11/16/2022 3:35:10 PM By: Samuella Bruin Entered By: Samuella Bruin on 11/16/2022 13:54:02 -------------------------------------------------------------------------------- Patient/Caregiver Education Details Patient Name: Date of Service: Albert Perez 5/14/2024andnbsp1:30 PM Medical Record Number: [id number] Patient Account Number: [id number] Date of Birth/Gender: Treating RN: March 16, 1951 (72 y.o. Marlan Palau Primary Care Physician: Henrine Screws Other Clinician: Referring Physician: Treating Physician/Extender: Gerald Leitz in Treatment: 0 Education Assessment Education Provided To: Patient Education Topics Provided Wound Debridement: Methods: Explain/Verbal Responses: Reinforcements needed, State content correctly Wound/Skin Impairment: Methods: Explain/Verbal Responses: Reinforcements needed, State  content correctly Electronic Signature(s) Signed: 11/16/2022 3:35:10 PM By: Samuella Bruin Entered By: Samuella Bruin on 11/16/2022 14:49:47 Brouse, Brandin L ([id number]) [id number] Page 7 of 11 -------------------------------------------------------------------------------- Wound  Assessment Details Patient Name: Date of Service: Albert Perez, Albert Perez 11/16/2022 1:30 PM Medical Record Number: [id number] Patient Account Number: [id number] Date of Birth/Sex: Treating RN: Aug 26, 1950 (72 y.o. Marlan Palau Primary Care Jaelyne Deeg: Henrine Screws Other Clinician: Referring Rekia Kujala: Treating Haven Pylant/Extender: Modesto Charon Weeks in Treatment: 0 Wound Status Wound Number: 1 Primary T be determined o Etiology: Wound Location: Right, Proximal, Lateral Lower Leg Wound Open Wounding Event: Gradually Appeared Status: Date Acquired: 10/04/2022 Comorbid Sleep Apnea, Arrhythmia, Deep Vein Thrombosis, Hypertension, Weeks Of Treatment: 0 History: Osteoarthritis, Received Radiation Clustered Wound: No Photos Wound Measurements Length: (cm) 5 Width: (cm) 3.4 Depth: (cm) 0.1 Area: (cm) 13.352 Volume: (cm) 1.335 % Reduction in Area: % Reduction in Volume: Epithelialization: Small (1-33%) Tunneling: No Undermining: No Wound Description Classification: Full Thickness Without Exposed Support Structures Wound Margin: Distinct, outline attached Exudate Amount: Medium Exudate Type: Serosanguineous Exudate Color: red, brown Foul Odor After Cleansing: No Slough/Fibrino Yes Wound Bed Granulation Amount: Large (67-100%) Exposed Structure Granulation Quality: Red, Hyper-granulation Fascia Exposed: No Necrotic Amount: Small (1-33%) Fat Layer (Subcutaneous Tissue) Exposed: Yes Tendon Exposed: No Muscle Exposed: No Joint Exposed: No Bone Exposed: No Periwound Skin Texture Texture Color No Abnormalities Noted: Yes No Abnormalities Noted:  No Hemosiderin Staining: Yes Moisture No Abnormalities Noted: Yes Temperature / Pain Temperature: No Abnormality Electronic Signature(s) Signed: 11/16/2022 3:35:10 PM By: Samuella Bruin Entered By: Samuella Bruin on 11/16/2022 14:04:23 Runkel, Inigo L ([id number]) [id number] Page 8 of 11 -------------------------------------------------------------------------------- Wound Assessment Details Patient Name: Date of Service: Albert MANUEL, BLEGEN 11/16/2022 1:30 PM Medical Record Number: [id number] Patient Account Number: [id number] Date of Birth/Sex: Treating RN: Sep 20, 1950 (72 y.o. Marlan Palau Primary Care Amai Cappiello: Henrine Screws Other Clinician: Referring Ronnel Zuercher: Treating Jaidalyn Schillo/Extender: Modesto Charon Weeks in Treatment: 0 Wound Status Wound Number: 2 Primary T be determined o Etiology: Wound Location: Right, Lateral Lower Leg Wound Open Wounding Event: Gradually Appeared Status: Date Acquired: 10/04/2022 Comorbid Sleep Apnea, Arrhythmia, Deep Vein Thrombosis, Hypertension, Weeks Of Treatment: 0 History: Osteoarthritis, Received Radiation Clustered Wound: No Photos Wound Measurements Length: (cm) 5 Width: (cm) 1.1 Depth: (cm) 0.1 Area: (cm) 4.32 Volume: (cm) 0.432 % Reduction in Area: % Reduction in Volume: Epithelialization: Small (1-33%) Tunneling: No Undermining: No Wound Description Classification: Full Thickness Without Exposed Support Structures Wound Margin: Distinct, outline attached Exudate Amount: Medium Exudate Type: Serosanguineous Exudate Color: red, brown Foul Odor After Cleansing: No Slough/Fibrino Yes Wound Bed Granulation Amount: Large (67-100%) Exposed Structure Granulation Quality: Red, Pink Fascia Exposed: No Necrotic Amount: Small (1-33%) Fat Layer (Subcutaneous Tissue) Exposed: Yes Necrotic Quality: Eschar, Adherent Slough Tendon Exposed: No Muscle Exposed: No Joint Exposed:  No Bone Exposed: No Periwound Skin Texture Texture Color No Abnormalities Noted: Yes No Abnormalities Noted: No Hemosiderin Staining: Yes Moisture No Abnormalities Noted: Yes Temperature / Pain Temperature: No Abnormality Electronic Signature(s) Signed: 11/16/2022 3:35:10 PM By: Samuella Bruin Entered By: Samuella Bruin on 11/16/2022 14:05:19 Vallez, Kelby L ([id number]) [id number] Page 9 of 11 -------------------------------------------------------------------------------- Wound Assessment Details Patient Name: Date of Service: Albert PANAYIOTIS, MIRELEZ 11/16/2022 1:30 PM Medical Record Number: [id number] Patient Account Number: [id number] Date of Birth/Sex: Treating RN: [birth year] (72 y.o. Marlan Palau Primary Care Kiesha Ensey: Henrine Screws Other Clinician: Referring Micaiah Litle: Treating Kashmere Daywalt/Extender: Modesto Charon Weeks in Treatment: 0 Wound Status Wound Number: 3 Primary T be determined o Etiology: Wound Location: Right, Distal, Lateral Lower Leg Wound Open Wounding Event: Gradually Appeared Status: Date Acquired: 10/04/2022 Comorbid Sleep Apnea, Arrhythmia, Deep Vein  Thrombosis, Hypertension, Weeks Of Treatment: 0 History: Osteoarthritis, Received Radiation Clustered Wound: No Photos Wound Measurements Length: (cm) 0.5 Width: (cm) 0.7 Depth: (cm) 0.1 Area: (cm) 0.275 Volume: (cm) 0.027 % Reduction in Area: % Reduction in Volume: Epithelialization: Small (1-33%) Tunneling: No Undermining: No Wound Description Classification: Full Thickness Without Exposed Support Structures Wound Margin: Distinct, outline attached Exudate Amount: Medium Exudate Type: Serosanguineous Exudate Color: red, brown Foul Odor After Cleansing: No Slough/Fibrino No Wound Bed Granulation Amount: Large (67-100%) Exposed Structure Granulation Quality: Red Fascia Exposed: No Necrotic Amount: Small (1-33%) Fat Layer (Subcutaneous  Tissue) Exposed: Yes Necrotic Quality: Eschar Tendon Exposed: No Muscle Exposed: No Joint Exposed: No Bone Exposed: No Periwound Skin Texture Texture Color No Abnormalities Noted: Yes No Abnormalities Noted: No Hemosiderin Staining: Yes Moisture No Abnormalities Noted: Yes Temperature / Pain Temperature: No Abnormality Electronic Signature(s) Signed: 11/16/2022 3:35:10 PM By: Samuella Bruin Entered By: Samuella Bruin on 11/16/2022 14:05:45 Fikes, Robbert L ([id number]) [id number] Page 10 of 11 -------------------------------------------------------------------------------- Wound Assessment Details Patient Name: Date of Service: Albert DERROL, SHARMAN 11/16/2022 1:30 PM Medical Record Number: [id number] Patient Account Number: [id number] Date of Birth/Sex: Treating RN: [birth year] (72 y.o. Marlan Palau Primary Care Kissie Ziolkowski: Henrine Screws Other Clinician: Referring Lovenia Debruler: Treating Chaunice Obie/Extender: Modesto Charon Weeks in Treatment: 0 Wound Status Wound Number: 4 Primary T be determined o Etiology: Wound Location: Right, Lateral Ankle Wound Open Wounding Event: Gradually Appeared Status: Date Acquired: 10/04/2022 Comorbid Sleep Apnea, Arrhythmia, Deep Vein Thrombosis, Hypertension, Weeks Of Treatment: 0 History: Osteoarthritis, Received Radiation Clustered Wound: No Photos Wound Measurements Length: (cm) 0.7 Width: (cm) 0.7 Depth: (cm) 0.1 Area: (cm) 0.385 Volume: (cm) 0.038 % Reduction in Area: % Reduction in Volume: Epithelialization: Small (1-33%) Tunneling: No Undermining: No Wound Description Classification: Full Thickness Without Exposed Support Structures Wound Margin: Distinct, outline attached Exudate Amount: Medium Exudate Type: Serosanguineous Exudate Color: red, brown Foul Odor After Cleansing: No Slough/Fibrino Yes Wound Bed Granulation Amount: Large (67-100%) Exposed Structure Granulation  Quality: Red Fascia Exposed: No Necrotic Amount: Small (1-33%) Fat Layer (Subcutaneous Tissue) Exposed: Yes Necrotic Quality: Adherent Slough Tendon Exposed: No Muscle Exposed: No Joint Exposed: No Bone Exposed: No Periwound Skin Texture Texture Color No Abnormalities Noted: Yes No Abnormalities Noted: No Hemosiderin Staining: Yes Moisture No Abnormalities Noted: Yes Temperature / Pain Temperature: No Abnormality Electronic Signature(s) Signed: 11/16/2022 3:35:10 PM By: Samuella Bruin Entered By: Samuella Bruin on 11/16/2022 14:06:05 Herter, Bentzion L ([id number]) [id number] Page 11 of 11 -------------------------------------------------------------------------------- Vitals Details Patient Name: Date of Service: Albert Perez, KADON RAUCH 11/16/2022 1:30 PM Medical Record Number: [id number] Patient Account Number: [id number] Date of Birth/Sex: Treating RN: 17-Feb-1951 (72 y.o. Marlan Palau Primary Care Skylar Priest: Henrine Screws Other Clinician: Referring Dewon Mendizabal: Treating Rodriquez Thorner/Extender: Modesto Charon Weeks in Treatment: 0 Vital Signs Time Taken: 13:48 Temperature (F): 97.7 Pulse (bpm): 67 Respiratory Rate (breaths/min): 18 Blood Pressure (mmHg): 96/66 Reference Range: 80 - 120 mg / dl Electronic Signature(s) Signed: 11/16/2022 3:35:10 PM By: Samuella Bruin Entered By: Samuella Bruin on 11/16/2022 13:48:38

## 2023-01-04 NOTE — ED Notes (Signed)
IV access attempts were made x3 IV pending

## 2023-01-04 NOTE — Progress Notes (Signed)
Visit made to patients room to discuss wearing CPAP.  He states he tried it years ago and it didn't work out for him.  Pt advised if he changes his mind to have RN call RT.

## 2023-01-04 NOTE — Telephone Encounter (Signed)
Wife called with pt update-  Declining -"Albert Perez needs hospitalization-he is sleeping all the time.He is in terrible pain in his right hip.  He took 6 oxycodone tablets yesterday which is an increase in his usual dose. The home BP readings have been below 100. 84/45 yesterday.  MRI scheduled for today.   Immobility -He cannot get up.  PT told her that Jab[facility] is not making any progress and they have stopped treatment.   Appetite- Poor  He  is losing weight. He is not eating well. He ate 1/2 tomato sandwich yesterday. Skin-"He has more bed sores"- He has a caretaker that bathes him daily.   Caregiver stress-Joyce said-"I have done everything I can do , I can't do any more. I think 2 weeks in a skilled nursing her will improve his strength".  I instructed [name] to call EMS to take him to the ED. She agreed.

## 2023-01-04 NOTE — ED Provider Notes (Addendum)
[place] EMERGENCY DEPARTMENT AT [facility] Provider Note   CSN: [id number] Arrival date & time: 01/04/23  1331     History  Chief Complaint  Patient presents with   Hypotension    Albert Perez is a 72 y.o. male.  72 year old male with a past medical history of metastatic non-small cell lung cancer presents emergency department for hypotension x 1 day.  Wife at bedside states he has lost significant amount of weight over the last few months and recently has had a very poor appetite.  He is very weak and unable to function at home.  Wife also states she is unable to take care of him at home.  He is currently receiving treatment with Tagrisso and Tepotinib (Tepmetko).  He follows with Dr. Shirline Frees.         Home Medications Prior to Admission medications   Medication Sig Start Date End Date Taking? Authorizing Provider  apixaban (ELIQUIS) 5 MG TABS tablet Take 1 tablet (5 mg total) by mouth 2 (two) times daily. 12/01/22  Yes Si Gaul, MD  bacitracin ointment Apply 1 Application topically 2 (two) times daily. 10/19/22  Yes Theresia Lo, Turkey K, DO  carvedilol (COREG) 6.25 MG tablet TAKE 1 TABLET BY MOUTH TWICE A DAY 09/20/22  Yes Camnitz, Will Daphine Deutscher, MD  Cholecalciferol (VITAMIN D-3) 25 MCG (1000 UT) CAPS Take 1,000 Units by mouth daily.   Yes [provider]  loperamide (IMODIUM) 2 MG capsule Take 2 mg by mouth as needed for diarrhea or loose stools.   Yes [provider]  osimertinib mesylate (TAGRISSO) 80 MG tablet Take 1 tablet (80 mg total) by mouth daily. 10/27/22  Yes Si Gaul, MD  oxyCODONE-acetaminophen (PERCOCET) 7.5-325 MG tablet Take 1-2 tablets by mouth every 6 (six) hours as needed for severe pain. 12/20/22  Yes Walisiewicz, Kaitlyn E, PA-C  rosuvastatin (CRESTOR) 5 MG tablet Take 5 mg by mouth daily. 04/26/18  Yes [provider]  SANTYL 250 UNIT/GM ointment Apply 1 Application topically daily. 11/24/22  Yes [provider]  tepotinib hcl (TEPMETKO) 225 MG tablet Take 1 tablet (225 mg total) by mouth daily. Take as directed 12/17/22  Yes Si Gaul, MD  vitamin B-12 (CYANOCOBALAMIN) 1000 MCG tablet Take 1,000 mcg by mouth daily.   Yes [provider]  furosemide (LASIX) 20 MG tablet TAKE 1 TABLET BY MOUTH EVERY DAY 11/17/22   Runell Gess, MD  potassium chloride (KLOR-CON) 10 MEQ tablet Take 1 tablet (10 mEq total) by mouth daily. 06/23/22 12/20/22  Marjie Skiff E, PA-C  torsemide (DEMADEX) 20 MG tablet TAKE 1 TABLET BY MOUTH TWICE A DAY 08/27/22   Cannon Kettle, PA-C      Allergies    Patient has no known allergies.    Review of Systems   Review of Systems  All other systems reviewed and are negative.   Physical Exam Updated Vital Signs BP (!) 102/56   Pulse 62   Temp 98.3 F (36.8 C) (Oral)   Resp (!) 23   Ht 5\' 7"  (1.702 m)   Wt 56.8 kg   SpO2 93%   BMI 19.61 kg/m  Physical Exam Vitals reviewed.  Constitutional:      Appearance: He is cachectic. He is ill-appearing.  HENT:     Nose: Nose normal.     Mouth/Throat:     Mouth: Mucous membranes are dry.  Eyes:     Conjunctiva/sclera: Conjunctivae normal.  Cardiovascular:  Rate and Rhythm: Normal rate.     Heart sounds: Normal heart sounds.  Pulmonary:     Effort: No respiratory distress.  Abdominal:     General: There is no distension.     Tenderness: There is no guarding.  Musculoskeletal:     Cervical back: Neck supple.  Skin:    General: Skin is dry.     Capillary Refill: Capillary refill takes 2 to 3 seconds.  Neurological:     Mental Status: Mental status is at baseline.     ED Results / Procedures / Treatments   Labs (all labs ordered are listed, but only abnormal results are displayed) Labs Reviewed  COMPREHENSIVE METABOLIC PANEL - Abnormal; Notable for the following components:      Result Value   Glucose, Bld 128 (*)    BUN 25 (*)    Calcium 7.7 (*)    Total Protein 5.0  (*)    Albumin <1.5 (*)    AST 12 (*)    Alkaline Phosphatase 132 (*)    All other components within normal limits  CBC WITH DIFFERENTIAL/PLATELET - Abnormal; Notable for the following components:   WBC 10.7 (*)    RBC 1.83 (*)    Hemoglobin 5.4 (*)    HCT 17.8 (*)    RDW 16.8 (*)    Neutro Abs 8.8 (*)    Abs Immature Granulocytes 0.08 (*)    All other components within normal limits  PROTIME-INR - Abnormal; Notable for the following components:   Prothrombin Time 26.6 (*)    INR 2.4 (*)    All other components within normal limits  APTT - Abnormal; Notable for the following components:   aPTT 63 (*)    All other components within normal limits  RESP PANEL BY RT-PCR (RSV, FLU A&B, COVID)  [id number]  CULTURE, BLOOD (ROUTINE X 2)  CULTURE, BLOOD (ROUTINE X 2)  LACTIC ACID, PLASMA  LACTIC ACID, PLASMA  URINALYSIS, W/ REFLEX TO CULTURE (INFECTION SUSPECTED)  OCCULT BLOOD X 1 CARD TO LAB, STOOL  POC OCCULT BLOOD, ED  TYPE AND SCREEN  ABO/RH  PREPARE RBC (CROSSMATCH)    EKG None  Radiology DG Chest Port 1 View  Result Date: 01/04/2023 CLINICAL DATA:  Decreased appetite, hypotensive EXAM: PORTABLE CHEST 1 VIEW COMPARISON:  03/28/2019 chest radiograph, correlation is also made with 10/26/2022 CT chest FINDINGS: Unchanged cardiac and mediastinal contours, with aortic atherosclerosis. Layering right pleural effusion, likely moderate, with associated atelectasis. No focal opacity in the left lung. No pneumothorax. No acute osseous abnormality. IMPRESSION: Layering right pleural effusion, likely moderate, with associated atelectasis. Electronically Signed   By: Wiliam Ke M.D.   On: 01/04/2023 16:00    Procedures Procedures    Medications Ordered in ED Medications  lactated ringers infusion ( Intravenous New Bag/Given 01/04/23 1452)  sodium chloride 0.9 % bolus 1,000 mL (1,000 mLs Intravenous New Bag/Given 01/04/23 1456)  vancomycin (VANCOCIN) IVPB 1000 mg/200 mL premix (1,000 mg  Intravenous New Bag/Given 01/04/23 1600)  cefTRIAXone (ROCEPHIN) 1 g in sodium chloride 0.9 % 100 mL IVPB (1 g Intravenous New Bag/Given (Non-Interop) 01/04/23 1457)  0.9 %  sodium chloride infusion (Manually program via Guardrails IV Fluids) ( Intravenous New Bag/Given 01/04/23 1629)  lactated ringers bolus 500 mL (500 mLs Intravenous New Bag/Given 01/04/23 1618)    ED Course/ Medical Decision Making/ A&P  Medical Decision Making 72 year old male with metastatic non-small cell lung cancer brought in due to hypotension and failure to thrive.  Patient was severely cachectic appearing and wife notes that he has not been taking adequate nutrition over the last few weeks.  He is concerned about some new onset hypotension noted by the at home nurse.  Patient is alert and interactive but noted to be hypotensive upon arrival.  His blood pressure has improved with IV fluids.  Wife denying any obvious source of bleeding other than some wounds on his back. CBC shows severe anemia at 5.4 and 2 units of packed red blood cells have been ordered. Due to his severe hypotension and known immunosuppression we did proceed with sepsis alert.  Antibiotics, IV fluids, lactic acid, and cultures are drawn.  Blood work pending for further evaluation of any underlying source of infection.  Hemoccult was negative.  Patient will be admitted for further care and evaluation.  Amount and/or Complexity of Data Reviewed Labs: ordered. Radiology: ordered. ECG/medicine tests: ordered.  Risk Prescription drug management. Decision regarding hospitalization.           Final Clinical Impression(s) / ED Diagnoses Final diagnoses:  None    Rx / DC Orders ED Discharge Orders     None         Romolo Sieling, DO 01/04/23 1742    Manpreet Strey, DO 01/29/23 1610

## 2023-01-04 NOTE — ED Notes (Signed)
ED TO INPATIENT HANDOFF REPORT  Name/Age/Gender Albert Perez 72 y.o. male  Code Status   Home/SNF/Other Home  Chief Complaint Symptomatic anemia [D64.9]  Level of Care/Admitting Diagnosis ED Disposition     ED Disposition  Admit   Condition  --   Comment  Hospital Area: [facility] [[id number]]  Level of Care: Stepdown [14]  Admit to SDU based on following criteria: Hemodynamic compromise or significant risk of instability:  Patient requiring short term acute titration and management of vasoactive drips, and invasive monitoring (i.e., CVP and Arterial line).  May admit patient to [facility] or [facility] if equivalent level of care is available:: No  Covid Evaluation: Asymptomatic - no recent exposure (last 10 days) testing not required  Diagnosis: Symptomatic anemia [7253664]  Admitting Physician: Bobette Mo [[id number]]  Attending Physician: Bobette Mo [[id number]]  Certification:: I certify this patient will need inpatient services for at least 2 midnights  Estimated Length of Stay: 2          Medical History Past Medical History:  Diagnosis Date   A-fib [facility])    PMH: 2000-2001   Arthritis    Cancer (HCC)    melanoma 2018   Diarrhea    due to medications   DVT (deep venous thrombosis) (HCC)    Left brachial vein DVT (01/30/19, following fracture)   Dysrhythmia    afib -    ETOH abuse    GERD (gastroesophageal reflux disease)    ocassional   History of radiation therapy    Right Hip 02/08/22-02/19/22-Dr. Antony Blackbird   History of radiation therapy    Right Pelvis-05/06/22-05/19/22- Dr. Antony Blackbird   Lung cancer [facility]) dx'd 02/26/2019   Metastatic cancer to bone (HCC) dx'd 02/26/2019   Sleep apnea    do not wear CPAP   Stroke (HCC) 2019   " mini"   Vertigo    Wears glasses     Allergies No Known Allergies  IV Location/Drains/Wounds Patient Lines/Drains/Airways Status     Active Line/Drains/Airways     Name  Placement date Placement time Site Days   Peripheral IV 07/20/22 20 G Right Antecubital 07/20/22  1818  Antecubital  168   Peripheral IV 01/04/23 Left Antecubital 01/04/23  1300  Antecubital  less than 1            Labs/Imaging Results for orders placed or performed during the hospital encounter of 01/04/23 (from the past 48 hour(s))  Lactic acid, plasma     Status: None   Collection Time: 01/04/23  2:31 PM  Result Value Ref Range   Lactic Acid, Venous 1.2 0.5 - 1.9 mmol/L    Comment: Performed at [facility], [address] [place], [place] [address]  Comprehensive metabolic panel     Status: Abnormal   Collection Time: 01/04/23  2:31 PM  Result Value Ref Range   Sodium 135 135 - 145 mmol/L   Potassium 4.1 3.5 - 5.1 mmol/L   Chloride 104 98 - 111 mmol/L   CO2 26 22 - 32 mmol/L   Glucose, Bld 128 (H) 70 - 99 mg/dL    Comment: Glucose reference range applies only to samples taken after fasting for at least 8 hours.   BUN 25 (H) 8 - 23 mg/dL   Creatinine, Ser 2.95 0.61 - 1.24 mg/dL   Calcium 7.7 (L) 8.9 - 10.3 mg/dL   Total Protein 5.0 (L) 6.5 - 8.1 g/dL   Albumin <1.8 (L)  3.5 - 5.0 g/dL   AST 12 (L) 15 - 41 U/L   ALT 14 0 - 44 U/L   Alkaline Phosphatase 132 (H) 38 - 126 U/L   Total Bilirubin 0.4 0.3 - 1.2 mg/dL   GFR, Estimated >16 >10 mL/min    Comment: (NOTE) Calculated using the CKD-EPI Creatinine Equation (2021)    Anion gap 5 5 - 15    Comment: Performed at [facility], [address] [place], [place] [address]  CBC with Differential     Status: Abnormal   Collection Time: 01/04/23  2:31 PM  Result Value Ref Range   WBC 10.7 (H) 4.0 - 10.5 K/uL   RBC 1.83 (L) 4.22 - 5.81 MIL/uL   Hemoglobin 5.4 (LL) 13.0 - 17.0 g/dL    Comment: REPEATED TO VERIFY THIS CRITICAL RESULT HAS VERIFIED AND BEEN CALLED TO Marlet Korte A., RN BY NATHAN THOMPSON ON 07 02 2024 AT 1530, AND HAS BEEN READ BACK. CRITICAL RESULT VERIFIED    HCT 17.8 (L)  39.0 - 52.0 %   MCV 97.3 80.0 - 100.0 fL   MCH 29.5 26.0 - 34.0 pg   MCHC 30.3 30.0 - 36.0 g/dL   RDW 40.9 (H) 81.1 - 91.4 %   Platelets 354 150 - 400 K/uL   nRBC 0.0 0.0 - 0.2 %   Neutrophils Relative % 82 %   Neutro Abs 8.8 (H) 1.7 - 7.7 K/uL   Lymphocytes Relative 8 %   Lymphs Abs 0.9 0.7 - 4.0 K/uL   Monocytes Relative 9 %   Monocytes Absolute 0.9 0.1 - 1.0 K/uL   Eosinophils Relative 0 %   Eosinophils Absolute 0.0 0.0 - 0.5 K/uL   Basophils Relative 0 %   Basophils Absolute 0.0 0.0 - 0.1 K/uL   Immature Granulocytes 1 %   Abs Immature Granulocytes 0.08 (H) 0.00 - 0.07 K/uL    Comment: Performed at [facility], [address] [place], [place] [address]  Protime-INR     Status: Abnormal   Collection Time: 01/04/23  2:31 PM  Result Value Ref Range   Prothrombin Time 26.6 (H) 11.4 - 15.2 seconds   INR 2.4 (H) 0.8 - 1.2    Comment: (NOTE) INR goal varies based on device and disease states. Performed at [facility], [address] [place], [place] [address]   APTT     Status: Abnormal   Collection Time: 01/04/23  2:31 PM  Result Value Ref Range   aPTT 63 (H) 24 - 36 seconds    Comment:        IF BASELINE aPTT IS ELEVATED, SUGGEST PATIENT RISK ASSESSMENT BE USED TO DETERMINE APPROPRIATE ANTICOAGULANT THERAPY. Performed at [facility], [address], [place], [place] [address]   Resp panel by RT-PCR (RSV, Flu A&B, Covid) Anterior Nasal Swab     Status: None   Collection Time: 01/04/23  2:43 PM   Specimen: Anterior Nasal Swab  Result Value Ref Range   SARS Coronavirus 2 by RT PCR NEGATIVE NEGATIVE    Comment: (NOTE) SARS-CoV-2 target nucleic acids are NOT DETECTED.  The SARS-CoV-2 RNA is generally detectable in upper respiratory specimens during the acute phase of infection. The lowest concentration of SARS-CoV-2 viral copies this assay can detect is 138 copies/mL. A negative result does not preclude  SARS-Cov-2 infection and should not be used as the sole basis for treatment or other patient management decisions. A negative result may occur  with  improper specimen collection/handling, submission of specimen other than nasopharyngeal swab, presence of viral mutation(s) within the areas targeted by this assay, and inadequate number of viral copies(<138 copies/mL). A negative result must be combined with clinical observations, patient history, and epidemiological information. The expected result is Negative.  Fact Sheet for Patients:  BloggerCourse.com  Fact Sheet for Healthcare Providers:  SeriousBroker.it  This test is no t yet approved or cleared by the [place] FDA and  has been authorized for detection and/or diagnosis of SARS-CoV-2 by FDA under an Emergency Use Authorization (EUA). This EUA will remain  in effect (meaning this test can be used) for the duration of the COVID-19 declaration under Section 564(b)(1) of the Act, 21 U.S.C.section 360bbb-3(b)(1), unless the authorization is terminated  or revoked sooner.       Influenza A by PCR NEGATIVE NEGATIVE   Influenza B by PCR NEGATIVE NEGATIVE    Comment: (NOTE) The Xpert Xpress SARS-CoV-2/FLU/RSV plus assay is intended as an aid in the diagnosis of influenza from Nasopharyngeal swab specimens and should not be used as a sole basis for treatment. Nasal washings and aspirates are unacceptable for Xpert Xpress SARS-CoV-2/FLU/RSV testing.  Fact Sheet for Patients: BloggerCourse.com  Fact Sheet for Healthcare Providers: SeriousBroker.it  This test is not yet approved or cleared by the [place] FDA and has been authorized for detection and/or diagnosis of SARS-CoV-2 by FDA under an Emergency Use Authorization (EUA). This EUA will remain in effect (meaning this test can be used) for the duration of the COVID-19  declaration under Section 564(b)(1) of the Act, 21 U.S.C. section 360bbb-3(b)(1), unless the authorization is terminated or revoked.     Resp Syncytial Virus by PCR NEGATIVE NEGATIVE    Comment: (NOTE) Fact Sheet for Patients: BloggerCourse.com  Fact Sheet for Healthcare Providers: SeriousBroker.it  This test is not yet approved or cleared by the [place] FDA and has been authorized for detection and/or diagnosis of SARS-CoV-2 by FDA under an Emergency Use Authorization (EUA). This EUA will remain in effect (meaning this test can be used) for the duration of the COVID-19 declaration under Section 564(b)(1) of the Act, 21 U.S.C. section 360bbb-3(b)(1), unless the authorization is terminated or revoked.  Performed at [facility], [address] [place], [place] [address]    DG Chest Port 1 View  Result Date: 01/04/2023 CLINICAL DATA:  Decreased appetite, hypotensive EXAM: PORTABLE CHEST 1 VIEW COMPARISON:  03/28/2019 chest radiograph, correlation is also made with 10/26/2022 CT chest FINDINGS: Unchanged cardiac and mediastinal contours, with aortic atherosclerosis. Layering right pleural effusion, likely moderate, with associated atelectasis. No focal opacity in the left lung. No pneumothorax. No acute osseous abnormality. IMPRESSION: Layering right pleural effusion, likely moderate, with associated atelectasis. Electronically Signed   By: Wiliam Ke M.D.   On: 01/04/2023 16:00    Pending Labs Unresulted Labs (From admission, onward)     Start     Ordered   01/04/23 1638  Type and screen [facility]  ONCE - STAT,   STAT       Comments: [facility]    01/04/23 1637   01/04/23 1539  Prepare RBC (crossmatch)  (Blood Administration Adult)  Once,   R       Question Answer Comment  # of Units 2 units   Transfusion Indications Hemoglobin < 7 gm/dL and symptomatic   Number  of Units to Keep Ahead NO units ahead   If emergent release  call blood bank Not emergent release      01/04/23 1539   01/04/23 1419  Lactic acid, plasma  (Septic presentation on arrival (screening labs, nursing and treatment orders for obvious sepsis))  STAT Now then every 2 hours,   R (with STAT occurrences)      01/04/23 1420   01/04/23 1419  Blood Culture (routine x 2)  (Septic presentation on arrival (screening labs, nursing and treatment orders for obvious sepsis))  BLOOD CULTURE X 2,   STAT      01/04/23 1420   01/04/23 1419  Urinalysis, w/ Reflex to Culture (Infection Suspected) -Urine, Catheterized  (Septic presentation on arrival (screening labs, nursing and treatment orders for obvious sepsis))  Once,   URGENT       Question:  Specimen Source  Answer:  Urine, Catheterized   01/04/23 1420            Vitals/Pain Today's Vitals   01/04/23 1356 01/04/23 1401 01/04/23 1500  BP: (!) 90/55  (!) 96/51  Pulse: 73  69  Resp: 14  13  Temp: 98.3 F (36.8 C)    TempSrc: Oral    SpO2: 91%  91%  Weight: 56.8 kg    Height: 5\' 7"  (1.702 m)    PainSc:  0-No pain     Isolation Precautions No active isolations  Medications Medications  lactated ringers infusion ( Intravenous New Bag/Given 01/04/23 1452)  vancomycin (VANCOCIN) IVPB 1000 mg/200 mL premix (1,000 mg Intravenous New Bag/Given 01/04/23 1600)  sodium chloride 0.9 % bolus 1,000 mL (1,000 mLs Intravenous New Bag/Given 01/04/23 1456)  cefTRIAXone (ROCEPHIN) 1 g in sodium chloride 0.9 % 100 mL IVPB (1 g Intravenous New Bag/Given (Non-Interop) 01/04/23 1457)  0.9 %  sodium chloride infusion (Manually program via Guardrails IV Fluids) ( Intravenous New Bag/Given 01/04/23 1629)  lactated ringers bolus 500 mL (500 mLs Intravenous New Bag/Given 01/04/23 1618)    Mobility non-ambulatory

## 2023-01-05 ENCOUNTER — Telehealth: Payer: Self-pay

## 2023-01-05 ENCOUNTER — Observation Stay (HOSPITAL_COMMUNITY): Payer: Medicare Other

## 2023-01-05 DIAGNOSIS — D649 Anemia, unspecified: Secondary | ICD-10-CM | POA: Diagnosis not present

## 2023-01-05 LAB — VITAMIN B12: Vitamin B-12: 1634 pg/mL — ABNORMAL HIGH (ref 180–914)

## 2023-01-05 LAB — BLOOD CULTURE ID PANEL (REFLEXED) - BCID2

## 2023-01-05 LAB — CBC
HCT: 24.3 % — ABNORMAL LOW (ref 39.0–52.0)
Hemoglobin: 7.8 g/dL — ABNORMAL LOW (ref 13.0–17.0)
MCH: 29.7 pg (ref 26.0–34.0)
MCHC: 32.1 g/dL (ref 30.0–36.0)
MCV: 92.4 fL (ref 80.0–100.0)
Platelets: 316 10*3/uL (ref 150–400)
RBC: 2.63 MIL/uL — ABNORMAL LOW (ref 4.22–5.81)
RDW: 15.9 % — ABNORMAL HIGH (ref 11.5–15.5)
WBC: 11 10*3/uL — ABNORMAL HIGH (ref 4.0–10.5)
nRBC: 0 % (ref 0.0–0.2)

## 2023-01-05 LAB — COMPREHENSIVE METABOLIC PANEL
ALT: 13 U/L (ref 0–44)
AST: 13 U/L — ABNORMAL LOW (ref 15–41)
Albumin: <1.5 g/dL — ABNORMAL LOW (ref 3.5–5.0)
Alkaline Phosphatase: 117 U/L (ref 38–126)
Anion gap: 9 (ref 5–15)
BUN: 21 mg/dL (ref 8–23)
CO2: 22 mmol/L (ref 22–32)
Calcium: 7.5 mg/dL — ABNORMAL LOW (ref 8.9–10.3)
Chloride: 103 mmol/L (ref 98–111)
Creatinine, Ser: 0.71 mg/dL (ref 0.61–1.24)
GFR, Estimated: >60 mL/min (ref >60)
Glucose, Bld: 103 mg/dL — ABNORMAL HIGH (ref 70–99)
Potassium: 3.9 mmol/L (ref 3.5–5.1)
Sodium: 134 mmol/L — ABNORMAL LOW (ref 135–145)
Total Bilirubin: 0.6 mg/dL (ref 0.3–1.2)
Total Protein: 4.6 g/dL — ABNORMAL LOW (ref 6.5–8.1)

## 2023-01-05 LAB — IRON AND TIBC: Iron: 17 ug/dL — ABNORMAL LOW (ref 45–182)

## 2023-01-05 LAB — FOLATE: Folate: 6.2 ng/mL (ref >5.9)

## 2023-01-05 LAB — FERRITIN: Ferritin: 1457 ng/mL — ABNORMAL HIGH (ref 24–336)

## 2023-01-05 LAB — TYPE AND SCREEN: Antibody Screen: NEGATIVE

## 2023-01-05 LAB — OCCULT BLOOD X 1 CARD TO LAB, STOOL: Fecal Occult Bld: NEGATIVE

## 2023-01-05 LAB — LACTIC ACID, PLASMA: Lactic Acid, Venous: 0.9 mmol/L (ref 0.5–1.9)

## 2023-01-05 LAB — BPAM RBC: Unit Type and Rh: 6200

## 2023-01-05 LAB — CULTURE, BLOOD (ROUTINE X 2)

## 2023-01-05 LAB — HEMOGLOBIN AND HEMATOCRIT, BLOOD
HCT: 26.7 % — ABNORMAL LOW (ref 39.0–52.0)
Hemoglobin: 8.5 g/dL — ABNORMAL LOW (ref 13.0–17.0)

## 2023-01-05 MED ORDER — OXYCODONE-ACETAMINOPHEN 7.5-325 MG PO TABS
1.0000 | ORAL_TABLET | Freq: Four times a day (QID) | ORAL | Status: DC
  Administered 2023-01-05 – 2023-01-10 (×21): 1 via ORAL
  Filled 2023-01-05 (×22): qty 1

## 2023-01-05 MED ORDER — OXYCODONE HCL 5 MG PO TABS
2.5000 mg | ORAL_TABLET | Freq: Once | ORAL | Status: AC | PRN
  Administered 2023-01-05: 2.5 mg via ORAL
  Filled 2023-01-05: qty 1

## 2023-01-05 MED ORDER — MEDIHONEY WOUND/BURN DRESSING EX PSTE
1.0000 | PASTE | Freq: Every day | CUTANEOUS | Status: DC
  Administered 2023-01-05 – 2023-01-11 (×7): 1 via TOPICAL
  Filled 2023-01-05 (×2): qty 44

## 2023-01-05 MED ORDER — OXYCODONE HCL 5 MG PO TABS
5.0000 mg | ORAL_TABLET | ORAL | Status: DC | PRN
  Administered 2023-01-05 – 2023-01-09 (×9): 5 mg via ORAL
  Filled 2023-01-05 (×9): qty 1

## 2023-01-05 MED ORDER — ENSURE ENLIVE PO LIQD
237.0000 mL | Freq: Two times a day (BID) | ORAL | Status: DC
  Administered 2023-01-05 – 2023-01-10 (×11): 237 mL via ORAL

## 2023-01-05 MED ORDER — TEPOTINIB HCL 225 MG PO TABS
225.0000 mg | ORAL_TABLET | ORAL | Status: DC
  Administered 2023-01-05 – 2023-01-07 (×2): 225 mg via ORAL

## 2023-01-05 NOTE — Progress Notes (Signed)
   01/05/23 2016  BiPAP/CPAP/SIPAP  Reason BIPAP/CPAP not in use Non-compliant   Pt stated he does not wear cpap at home and does not want to wear one tonight.  Family at bedside in agreement.

## 2023-01-05 NOTE — Consult Note (Addendum)
WOC Nurse Consult Note:  0830: Reason for Consult: Consult requested for right leg, sacrum, and back. Attempted to assess wounds and patient declined and requested I come back in one hour so he could sleep.  09:30: Attempted to assess wounds and patient declined and requested I come back at 10:15 after he has pain meds and they have time to work.  10:15: Attempted to assess wounds and patient declined and requested I come back at after he has eaten.  10:30: Pt is incontinent of loose stool and it is difficult to keep the affected area from becoming soiled. Cleaned and bed changed and turned to assess wounds. Discussed plan of care with family members at the bedside.  Sacrum with Stage 2 pressure injury, present on admission, 5X2X.2cm, red and moist. Bilat upper back with patchy areas of red round full thickness lesions with small amt bloody drainage; each is approx .1X.1X.1cm and are scattered across the middle back. Right leg with 3 areas of chronic full thickness wounds; Pt states he was previously followed by the outpatient wound care center and now his wife applies Medihoney daily with ABD pads and an ace wrap.  Right outer leg near knee with full thickness wound is 90% yellow slough, 10% red, visible tendon, mod amt tan drainage. 7X4X.5 cm Right posterior leg near knee with full thickness wound 5X3X.3cm, 90% yellow, 10% red, mod amt tan drainage Right lower inner leg with full thickness wound, 5X2X.3cm, 90% red, 10% yellow, mod amt tan drainage.  Plan: Float heels to reduce pressure.  Air mattress to reduce pressure and discomfort and increase airflow. Topical treatment orders provided for bedside nurses to perform as follows:  1. Apply Medihoney to all wounds on right inner/outer leg Q day, then cover with ABD pads and ace wrap 2. Foam dressings to upper back bleeding lesions, change Q 3 days or PRN soiling 3. Foam dressing to sacrum, change Q 3 days or PRN soiling. Please re-consult if  further assistance is needed.  Thank-you,  Cammie Mcgee MSN, RN, CWOCN, [place], CNS [phone]

## 2023-01-05 NOTE — Plan of Care (Signed)

## 2023-01-05 NOTE — Progress Notes (Signed)
PHARMACY - PHYSICIAN COMMUNICATION CRITICAL VALUE ALERT - BLOOD CULTURE IDENTIFICATION (BCID)  Albert Perez is an 72 y.o. male who presented to [facility] on 01/04/2023 with a chief complaint of symptomatic anemia  Assessment:  1/3 BCx bottles growing unidentified staph spp (no obvious source, although currently on oral chemotherapy)  Name of physician (or Provider) Contacted: Virgel Manifold  Current antibiotics: none  Changes to prescribed antibiotics recommended: no overt s/s infection; suggest monitoring off abx for now Recommendations accepted by provider  Results for orders placed or performed during the hospital encounter of 01/04/23  Blood Culture ID Panel (Reflexed) (Collected: 01/04/2023  2:38 PM)  Result Value Ref Range   Enterococcus faecalis NOT DETECTED NOT DETECTED   Enterococcus Faecium NOT DETECTED NOT DETECTED   Listeria monocytogenes NOT DETECTED NOT DETECTED   Staphylococcus species DETECTED (A) NOT DETECTED   Staphylococcus aureus (BCID) NOT DETECTED NOT DETECTED   Staphylococcus epidermidis NOT DETECTED NOT DETECTED   Staphylococcus lugdunensis NOT DETECTED NOT DETECTED   Streptococcus species NOT DETECTED NOT DETECTED   Streptococcus agalactiae NOT DETECTED NOT DETECTED   Streptococcus pneumoniae NOT DETECTED NOT DETECTED   Streptococcus pyogenes NOT DETECTED NOT DETECTED   A.calcoaceticus-baumannii NOT DETECTED NOT DETECTED   Bacteroides fragilis NOT DETECTED NOT DETECTED   Enterobacterales NOT DETECTED NOT DETECTED   Enterobacter cloacae complex NOT DETECTED NOT DETECTED   Escherichia coli NOT DETECTED NOT DETECTED   Klebsiella aerogenes NOT DETECTED NOT DETECTED   Klebsiella oxytoca NOT DETECTED NOT DETECTED   Klebsiella pneumoniae NOT DETECTED NOT DETECTED   Proteus species NOT DETECTED NOT DETECTED   Salmonella species NOT DETECTED NOT DETECTED   Serratia marcescens NOT DETECTED NOT DETECTED   Haemophilus influenzae NOT DETECTED NOT DETECTED   Neisseria  meningitidis NOT DETECTED NOT DETECTED   Pseudomonas aeruginosa NOT DETECTED NOT DETECTED   Stenotrophomonas maltophilia NOT DETECTED NOT DETECTED   Candida albicans NOT DETECTED NOT DETECTED   Candida auris NOT DETECTED NOT DETECTED   Candida glabrata NOT DETECTED NOT DETECTED   Candida krusei NOT DETECTED NOT DETECTED   Candida parapsilosis NOT DETECTED NOT DETECTED   Candida tropicalis NOT DETECTED NOT DETECTED   Cryptococcus neoformans/gattii NOT DETECTED NOT DETECTED    Shakeisha Horine A 01/05/2023  7:53 PM

## 2023-01-05 NOTE — Care Management Important Message (Signed)
Important Message  Patient Details  Name: Albert Perez MRN: [id number] Date of Birth: 05/06/1951   Medicare Important Message Given:        Howell Rucks, RN 01/05/2023, 12:40 PM

## 2023-01-05 NOTE — Telephone Encounter (Signed)
Call received from this patients wife requesting to know if this patient can receive his Tepotinib while he is in the hospital. This nurse reviewed the chart and noted that provider spoke with hospital doctor this morning and advised that patient continue with Tagrisso and Tepotinib while hospitalized.  Patients wife made aware.  No further questions ro concerns noted at this time.

## 2023-01-05 NOTE — Progress Notes (Addendum)
PROGRESS NOTE    Albert Perez  OZH:[id number]  DOB: [birth year]  DOA: 01/04/2023 PCP: Albert Screws, MD Outpatient Specialists:   Hospital course:  72 y.o. male with medical history significant of atrial fibrillation on apixaban, chronic pain secondary to stage IV NSCLC with involvement of ribs, pelvis and left humerus with pathologic fracture s/p palliative radiotherapy, medication induced diarrhea, DVT, EtOH abuse, GERD, OSA not on CPAP, and vertigo who presented to the emergency department with hypotension, decreased appetite and multiple wounds that are oozing blood, particularly his right upper back.     Subjective:  Patient states his biggest concern is his pain.  Notes he has right hip and spine pain which she has had for a while but seems to be worse overnight.  Other than that he has no acute complaints.  Admits to feeling quite weak, not sure if he feels better after transfusion.  Patient and family are requesting MRI of hip to be done while he is in house because apparently he was post to get that as an outpatient.   Objective: Vitals:   01/05/23 0507 01/05/23 0507 01/05/23 0910 01/05/23 1248  BP: 114/61 114/61 116/61 (!) 105/52  Pulse: (!) 58 (!) 58 (!) 58 80  Resp: (!) 22  16 20   Temp: 98.8 F (37.1 C)   98.8 F (37.1 C)  TempSrc: Oral   Oral  SpO2: 96% 97% 96% 95%  Weight:      Height:        Intake/Output Summary (Last 24 hours) at 01/05/2023 1452 Last data filed at 01/05/2023 0507 Gross per 24 hour  Intake 2336.39 ml  Output --  Net 2336.39 ml   Filed Weights   01/04/23 1356  Weight: 56.8 kg     Exam:  General: Chronically ill-appearing man looking somewhat uncomfortable lying in bed with attentive family members at bedside Eyes: sclera anicteric, conjuctiva mild injection bilaterally CVS: S1-S2, regular  Respiratory:  decreased air entry bilaterally secondary to decreased inspiratory effort, rales at bases  GI: NABS, soft, NT  LE: Back is CDI  however on review of images from H&P yesterday oozing from multiple areas of his back.  Data Reviewed:  Basic Metabolic Panel: Recent Labs  Lab 01/04/23 1431 01/05/23 0739  NA 135 134*  K 4.1 3.9  CL 104 103  CO2 26 22  GLUCOSE 128* 103*  BUN 25* 21  CREATININE 0.80 0.71  CALCIUM 7.7* 7.5*    CBC: Recent Labs  Lab 01/04/23 1431 01/05/23 0739  WBC 10.7* 11.0*  NEUTROABS 8.8*  --   HGB 5.4* 7.8*  HCT 17.8* 24.3*  MCV 97.3 92.4  PLT 354 316     Scheduled Meds:  bacitracin  1 Application Topical BID   cyanocobalamin  1,000 mcg Oral Daily   feeding supplement  237 mL Oral BID BM   leptospermum manuka honey  1 Application Topical Daily   osimertinib mesylate  80 mg Oral Daily   oxyCODONE-acetaminophen  1 tablet Oral QID   rosuvastatin  5 mg Oral Daily   tepotinib hcl  225 mg Oral QODAY   Continuous Infusions:   Assessment & Plan:   Symptomatic anemia Oozing Pressure Wound on Back Baseline hemoglobin appears to be around 10, last checked 3 months ago and April 2024 Acute anemia possibly secondary to blood loss given ongoing oozing of pressure sore of his back while he is anticoagulated. Patient is hemodynamically stable He was transfused 2 units PRBC with appropriate increase from  5.4-7.8 We will continue to trend H&H overnight Apixaban is being held Wound care nurse has been consulted  Acute on chronic pain Stage IV NSCLC Patient with chronic back and shoulder pain secondary to metastatic disease and pathologic fracture His usual Percocet 7.5 every 4-6 hours was held overnight Will restart Percocet 7.5 every 6 hours standing with 5 mg oxycodone as needed for breakthrough pain Patient may well benefit from long-acting narcotics as an outpatient, being followed actively by palliative care Will order Noncon MRI right hip to be done in house when patient is more stable Tepmetko is being held  Atrial fibrillation H/oh pulmonary emboli On apixaban for  CHA2DS2-VASc 2 score of 5 and history of pulmonary emboli However given continuous oozing of wound on back decreased hemoglobin, will hold Not on any rate control medications for A-fib due to hypotension, rate is controlled at 50s  HTN Carvedilol is being held given low blood pressure Patient is apparently also written for Lasix and torsemide however unclear if he is taking, these are being held     DVT prophylaxis: SCD Code Status: DNR Family Communication: Patient's sister was at bedside throughout    Studies: DG Chest Port 1 View  Result Date: 01/04/2023 CLINICAL DATA:  Decreased appetite, hypotensive EXAM: PORTABLE CHEST 1 VIEW COMPARISON:  03/28/2019 chest radiograph, correlation is also made with 10/26/2022 CT chest FINDINGS: Unchanged cardiac and mediastinal contours, with aortic atherosclerosis. Layering right pleural effusion, likely moderate, with associated atelectasis. No focal opacity in the left lung. No pneumothorax. No acute osseous abnormality. IMPRESSION: Layering right pleural effusion, likely moderate, with associated atelectasis. Electronically Signed   By: Albert Perez M.D.   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

## 2023-01-05 NOTE — Progress Notes (Signed)
Prior-To-Admission Oral Chemotherapy for Treatment of Oncologic Disease   Order noted from Dr. Robb Matar to continue prior-to-admission oral chemotherapy regimen of Osimertinib (Tagrisso). Patient is also on Tepotinib (Tepmetko) as part of chemotherapy regimen, but this was not continued by admitting provider.   Procedure Per Pharmacy & Therapeutics Committee Policy: Orders for continuation of home oral chemotherapy for treatment of an oncologic disease will be held unless approved by an oncologist during current admission.    For patients receiving oncology care at [facility], inpatient pharmacist contacts patient's oncologist during regular office hours to review. If earlier review is medically necessary, attending physician consults [facility] on-call oncologist   For patients receiving oncology care outside of [facility], attending physician consults patient's oncologist to review. If this oncologist or their coverage cannot be reached, attending physician consults [facility] on-call oncologist   Discussed with Dr. Arbutus Ped (oncologist) 7/3 AM-per Dr. Arbutus Ped, patient needs to be continued on both Osimertinib (Tagrisso) and Tepotinib (Tepmetko) while hospitalized.      Greer Pickerel, PharmD, BCPS Clinical Pharmacist 01/05/2023 8:40 AM

## 2023-01-05 NOTE — Progress Notes (Signed)
Patient has family to assist with transportation

## 2023-01-05 NOTE — Progress Notes (Signed)
DIAGNOSIS: Stage IV (T3, N0, M1c) non-small cell lung cancer, adenocarcinoma presented with cavitary left lower lobe lung mass in addition to other pulmonary nodules and scattered hypermetabolic osseous metastatic disease involving the spine, ribs and bony pelvis as well as left proximal humeral pathologic fracture.    Molecular Biomarkers performed by [employer]ECTED [employer](S) / [name])      [id number]  Afatinib, Dacomitinib, Erlotinib, Gefitinib, Osimertinib, Ramucirumab Neratinib   FGFR1Amplification  Erdafitinib, Lenvatinib, Nintedanib, Pazopanib, Pemigatinib, Ponatinib   [id number] None   Repeated Moleculars by [employer]: Positive for MET Amplification     PRIOR THERAPY:  1) Palliative radiotherapy to the the left humerus under the care of Dr. Roselind Messier. Last treatment ~04/24/2019.  2) Palliative radiation to the Right Pelvis/Ilium under the care of Dr. Roselind Messier. Last dose 05/19/22.    CURRENT THERAPY: 1) Targeted therapy with Tagrisso 80 p.o. daily.  He started the first dose on April 21, 2019. Tepmetko 450 mg p.o. every other day starting on 08/24/22.  2) Xgeva every 4 weeks, last dose 04/27/22.   Subjective: The patient is seen and examined today.  Several family members were at the bedside including his wife, 2 sister and brother-in-law.  The patient was admitted to the hospital complaining of significant fatigue and weakness as well as hypotension and lack of appetite.  He also had multiple lower extremity wounds and ulcerations that have been weeping.  Also had lower back pain and a lot of pain in the right hip.  He has been on treatment with Tagrisso and Tepotinib (Tepmetko) but has intolerance issues to dovitinib and he has been using a very low-dose of 225 every other day less of 450 mg every day.  The patient was unable to make it to the office for several months and most of his visit were done virtually by phone.  He did not receive any face-to-face  evaluation for long time.  His condition was getting worse and the patient presented to the emergency department for evaluation.  During his evaluation at the emergency department his CBC showed globin was down to 5.4 and hematocrit 17.8% with slightly elevated white blood count and normal platelets count. The patient received 2 units of PRBCs transfusion and his hemoglobin today is up to 7.8 and hematocrit 24.3%.  When seen today he is feeling a little bit better but continues to have the pain in the right hip as well as the oozing wounds of the lower extremities.  Objective: Vital signs in last 24 hours: Temp:  [98.2 F (36.8 C)-98.8 F (37.1 C)] 98.8 F (37.1 C) (07/03 1248) Pulse Rate:  [56-80] 80 (07/03 1248) Resp:  [16-22] 20 (07/03 1248) BP: (90-119)/(50-61) 105/52 (07/03 1248) SpO2:  [92 %-97 %] 95 % (07/03 1248)  Intake/Output from previous day: 07/02 0701 - 07/03 0700 In: 2336.4 [P.O.:240; I.V.:1424.4; Blood:672] Out: -  Intake/Output this shift: No intake/output data recorded.  General appearance: alert, cooperative, fatigued, and mild distress Resp: clear to auscultation bilaterally Cardio: regular rate and rhythm, S1, S2 normal, no murmur, click, rub or gallop GI: soft, non-tender; bowel sounds normal; no masses,  no organomegaly Extremities: edema 1+ edema and several oozing wounds that has been dressed  Lab Results:  Recent Labs    01/04/23 1431 01/05/23 0739  WBC 10.7* 11.0*  HGB 5.4* 7.8*  HCT 17.8* 24.3*  PLT 354 316   BMET Recent Labs    01/04/23 1431 01/05/23 0739  NA 135 134*  K 4.1 3.9  CL 104 103  CO2 26 22  GLUCOSE 128* 103*  BUN 25* 21  CREATININE 0.80 0.71  CALCIUM 7.7* 7.5*    Studies/Results: DG Chest Port 1 View  Result Date: 01/04/2023 CLINICAL DATA:  Decreased appetite, hypotensive EXAM: PORTABLE CHEST 1 VIEW COMPARISON:  03/28/2019 chest radiograph, correlation is also made with 10/26/2022 CT chest FINDINGS: Unchanged cardiac and  mediastinal contours, with aortic atherosclerosis. Layering right pleural effusion, likely moderate, with associated atelectasis. No focal opacity in the left lung. No pneumothorax. No acute osseous abnormality. IMPRESSION: Layering right pleural effusion, likely moderate, with associated atelectasis. Electronically Signed   By: Wiliam Ke M.D.   On: 01/04/2023 16:00    Medications: I have reviewed the patient's current medications.  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  The patient has been on treatment with started therapy with Tagrisso started April 21, 2019 and has been tolerating the treatment well.  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 daily but his family has been giving him only 225 every other day on their own because he was not able to tolerate the stander daily treatment.  He has been doing fine with this treatment and his last imaging study showed no evidence for disease progression but the patient had extensive osseous metastasis especially in the right hip.  This is limiting his mobility and also require a lot of pain medication at home. I recommended for the patient to continue his current treatment with Tagrisso and Tepotinib (Tepmetko) during his hospitalization. I recommended for the patient to have complete staging workup during his hospitalization because of lack of transportation and need for EMS to transfer him for his imaging studies which is scheduled to be done in less than 2 weeks. I will also order MRI of the right hip for further evaluation of this area and to rule out any further disease progression. For the  anemia, I did order anemia panel to identify the underlying etiology of his anemia but this could be secondary to gastrointestinal blood loss as the patient is currently on anticoagulation with Eliquis.  He already received 2 units of PRBCs transfusion and we will give him additional transfusion to keep his hemoglobin above 8.0. The result of the imaging studies may become available during this hospitalization but unfortunately I wil not be available to discuss the results with him until Monday.  I informed the patient and his family but I will reach out to them on Monday if there is any need to change his treatment. If there is any urgent need to discuss the scan results with the patient and his family, please consult the on-call physician for oncology, otherwise I will take care of it on Monday when I return back to the office. Thank you for taking good care of Mr. Fayette.  Please call if you have any questions.    LOS: 1 day    Lajuana Matte 01/05/2023

## 2023-01-05 NOTE — TOC Initial Note (Signed)
Transition of Care [facility]) - Initial/Assessment Note    Patient Details  Name: Albert Perez MRN: [id number] Date of Birth: 1951-02-21  [facility] CM/SW Contact:    Howell Rucks, RN Phone Number: 01/05/2023, 12:45 PM  Clinical Narrative:  Met with pt/pt's spouse Albert Perez) at bedside to introduce role of TOC/NCM and review for dc planning. Per pt's spouse patient has home personal care services to assist with ADL's, pt's spouse reports she bandages and cares for pt's wounds, she reports pt is currently non ambulatory. Pt's spouse voiced she would like to see pt dc to short term rehab, there is no PT eval scheduled at this time. MOON form completed with pt's spouse. TOC will continue to follow.                  Barriers to Discharge: Continued Medical Work up   Patient Goals and CMS Choice Patient states their goals for this hospitalization and ongoing recovery are:: short term rehab-SNF          Expected Discharge Plan and Services                                              Prior Living Arrangements/Services   Lives with:: Spouse Patient language and need for interpreter reviewed:: Yes        Need for Family Participation in Patient Care: Yes (Comment) Care giver support system in place?: Yes (comment)   Criminal Activity/Legal Involvement Pertinent to Current Situation/Hospitalization: No - Comment as needed  Activities of Daily Living Home Assistive Devices/Equipment: Hospital bed, Blood pressure cuff, Wheelchair ADL Screening (condition at time of admission) Patient's cognitive ability adequate to safely complete daily activities?: No Is the patient deaf or have difficulty hearing?: No Does the patient have difficulty seeing, even when wearing glasses/contacts?: No Does the patient have difficulty concentrating, remembering, or making decisions?: Yes Patient able to express need for assistance with ADLs?: Yes Does the patient have difficulty  dressing or bathing?: Yes Independently performs ADLs?: No Communication: Independent Dressing (OT): Needs assistance Is this a change from baseline?: Pre-admission baseline Grooming: Needs assistance Is this a change from baseline?: Pre-admission baseline Feeding: Independent Bathing: Needs assistance Is this a change from baseline?: Pre-admission baseline Toileting: Needs assistance Is this a change from baseline?: Pre-admission baseline In/Out Bed: Dependent Is this a change from baseline?: Pre-admission baseline Walks in Home: Dependent Is this a change from baseline?: Pre-admission baseline Does the patient have difficulty walking or climbing stairs?: Yes Weakness of Legs: Both Weakness of Arms/Hands: Both  Permission Sought/Granted Permission sought to share information with : Case Manager Permission granted to share information with : Yes, Verbal Permission Granted  Share Information with NAME: Fannie Knee, RN           Emotional Assessment Appearance:: Appears stated age Attitude/Demeanor/Rapport: Gracious Affect (typically observed): Accepting Orientation: : Oriented to Self, Oriented to Place, Oriented to  Time, Oriented to Situation Alcohol / Substance Use: Not Applicable Psych Involvement: No (comment)  Admission diagnosis:  Symptomatic anemia [D64.9] Patient Active Problem List   Diagnosis Date Noted   Symptomatic anemia 01/04/2023   Cancer related pain 07/20/2022   Swelling of lower extremity 06/23/2022   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) 03/01/2022   Pulmonary emboli (HCC) 11/27/2020   Hyperlipidemia 12/05/2019   Elevated coronary artery calcium score 12/05/2019  Tobacco abuse 06/14/2019   SVT (supraventricular tachycardia) 06/14/2019   Adenocarcinoma of left lung, stage 4 (HCC) 04/06/2019   Goals of care, counseling/discussion 04/06/2019   Encounter for antineoplastic chemotherapy 04/06/2019   Mass of lower lobe of left lung 03/27/2019    Severe obstructive sleep apnea-hypopnea syndrome 08/18/2018   History of atrial fibrillation 07/19/2018   Snoring 07/19/2018   Essential hypertension 07/19/2018   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 [place] [address] Phone: [phone] Fax: [phone]  [id number] [address] [place] - [address] [place] [address] Phone: [phone] Fax: [phone]     Social Determinants of Health (SDOH) Social History: SDOH Screenings   Food Insecurity: No Food Insecurity (01/04/2023)  Housing: Low Risk  (01/04/2023)  Transportation Needs: Unmet Transportation Needs (01/04/2023)  Utilities: Not At Risk (01/04/2023)  Tobacco Use: High Risk (01/04/2023)   SDOH Interventions:     Readmission Risk Interventions    01/05/2023   12:43 PM  Readmission Risk Prevention Plan  Transportation Screening Complete  PCP or Specialist Appt within 5-7 Days Complete  Home Care Screening Complete  Medication Review (RN CM) Complete

## 2023-01-05 NOTE — Care Management Obs Status (Signed)
MEDICARE OBSERVATION STATUS NOTIFICATION   Patient Details  Name: Albert Perez MRN: [id number] Date of Birth: 11-08-1950   Medicare Observation Status Notification Given:       Howell Rucks, RN 01/05/2023, 12:42 PM

## 2023-01-06 ENCOUNTER — Inpatient Hospital Stay (HOSPITAL_COMMUNITY): Payer: Medicare Other

## 2023-01-06 ENCOUNTER — Encounter (HOSPITAL_COMMUNITY): Payer: Self-pay | Admitting: Internal Medicine

## 2023-01-06 DIAGNOSIS — L8990 Pressure ulcer of unspecified site, unspecified stage: Secondary | ICD-10-CM | POA: Diagnosis not present

## 2023-01-06 DIAGNOSIS — Z7401 Bed confinement status: Secondary | ICD-10-CM | POA: Diagnosis not present

## 2023-01-06 DIAGNOSIS — D638 Anemia in other chronic diseases classified elsewhere: Secondary | ICD-10-CM | POA: Diagnosis present

## 2023-01-06 DIAGNOSIS — I10 Essential (primary) hypertension: Secondary | ICD-10-CM | POA: Diagnosis present

## 2023-01-06 DIAGNOSIS — L89893 Pressure ulcer of other site, stage 3: Secondary | ICD-10-CM | POA: Diagnosis present

## 2023-01-06 DIAGNOSIS — R531 Weakness: Secondary | ICD-10-CM | POA: Diagnosis not present

## 2023-01-06 DIAGNOSIS — N289 Disorder of kidney and ureter, unspecified: Secondary | ICD-10-CM | POA: Diagnosis not present

## 2023-01-06 DIAGNOSIS — M25551 Pain in right hip: Secondary | ICD-10-CM | POA: Diagnosis not present

## 2023-01-06 DIAGNOSIS — I4891 Unspecified atrial fibrillation: Secondary | ICD-10-CM | POA: Diagnosis present

## 2023-01-06 DIAGNOSIS — Z7189 Other specified counseling: Secondary | ICD-10-CM | POA: Diagnosis not present

## 2023-01-06 DIAGNOSIS — L89152 Pressure ulcer of sacral region, stage 2: Secondary | ICD-10-CM | POA: Diagnosis present

## 2023-01-06 DIAGNOSIS — D649 Anemia, unspecified: Secondary | ICD-10-CM | POA: Diagnosis not present

## 2023-01-06 DIAGNOSIS — R64 Cachexia: Secondary | ICD-10-CM | POA: Diagnosis present

## 2023-01-06 DIAGNOSIS — C3492 Malignant neoplasm of unspecified part of left bronchus or lung: Secondary | ICD-10-CM | POA: Diagnosis not present

## 2023-01-06 DIAGNOSIS — Z1152 Encounter for screening for COVID-19: Secondary | ICD-10-CM | POA: Diagnosis not present

## 2023-01-06 DIAGNOSIS — C7951 Secondary malignant neoplasm of bone: Secondary | ICD-10-CM | POA: Diagnosis not present

## 2023-01-06 DIAGNOSIS — Z743 Need for continuous supervision: Secondary | ICD-10-CM | POA: Diagnosis not present

## 2023-01-06 DIAGNOSIS — Z66 Do not resuscitate: Secondary | ICD-10-CM | POA: Diagnosis not present

## 2023-01-06 DIAGNOSIS — D849 Immunodeficiency, unspecified: Secondary | ICD-10-CM | POA: Diagnosis present

## 2023-01-06 DIAGNOSIS — Z681 Body mass index (BMI) 19 or less, adult: Secondary | ICD-10-CM | POA: Diagnosis not present

## 2023-01-06 DIAGNOSIS — R601 Generalized edema: Secondary | ICD-10-CM | POA: Diagnosis not present

## 2023-01-06 DIAGNOSIS — M84422A Pathological fracture, left humerus, initial encounter for fracture: Secondary | ICD-10-CM | POA: Diagnosis present

## 2023-01-06 DIAGNOSIS — E43 Unspecified severe protein-calorie malnutrition: Secondary | ICD-10-CM | POA: Diagnosis present

## 2023-01-06 DIAGNOSIS — G9340 Encephalopathy, unspecified: Secondary | ICD-10-CM | POA: Diagnosis present

## 2023-01-06 DIAGNOSIS — D6832 Hemorrhagic disorder due to extrinsic circulating anticoagulants: Secondary | ICD-10-CM | POA: Diagnosis present

## 2023-01-06 DIAGNOSIS — C3490 Malignant neoplasm of unspecified part of unspecified bronchus or lung: Secondary | ICD-10-CM | POA: Diagnosis not present

## 2023-01-06 DIAGNOSIS — G893 Neoplasm related pain (acute) (chronic): Secondary | ICD-10-CM | POA: Diagnosis not present

## 2023-01-06 DIAGNOSIS — D62 Acute posthemorrhagic anemia: Secondary | ICD-10-CM | POA: Diagnosis not present

## 2023-01-06 DIAGNOSIS — R1310 Dysphagia, unspecified: Secondary | ICD-10-CM | POA: Diagnosis present

## 2023-01-06 DIAGNOSIS — J90 Pleural effusion, not elsewhere classified: Secondary | ICD-10-CM | POA: Diagnosis not present

## 2023-01-06 DIAGNOSIS — R627 Adult failure to thrive: Secondary | ICD-10-CM | POA: Diagnosis not present

## 2023-01-06 DIAGNOSIS — Z515 Encounter for palliative care: Secondary | ICD-10-CM | POA: Diagnosis not present

## 2023-01-06 DIAGNOSIS — I959 Hypotension, unspecified: Secondary | ICD-10-CM | POA: Diagnosis present

## 2023-01-06 DIAGNOSIS — Z79899 Other long term (current) drug therapy: Secondary | ICD-10-CM | POA: Diagnosis not present

## 2023-01-06 DIAGNOSIS — E871 Hypo-osmolality and hyponatremia: Secondary | ICD-10-CM | POA: Diagnosis not present

## 2023-01-06 DIAGNOSIS — J869 Pyothorax without fistula: Secondary | ICD-10-CM | POA: Diagnosis present

## 2023-01-06 DIAGNOSIS — I3139 Other pericardial effusion (noninflammatory): Secondary | ICD-10-CM | POA: Diagnosis not present

## 2023-01-06 LAB — BPAM RBC
Blood Product Expiration Date: 202407312359
Blood Product Expiration Date: 202407312359
ISSUE DATE / TIME: 202407022234
ISSUE DATE / TIME: 202407030156

## 2023-01-06 LAB — CBC
HCT: 25.3 % — ABNORMAL LOW (ref 39.0–52.0)
Hemoglobin: 8.2 g/dL — ABNORMAL LOW (ref 13.0–17.0)
MCH: 30.1 pg (ref 26.0–34.0)
MCHC: 32.4 g/dL (ref 30.0–36.0)
MCV: 93 fL (ref 80.0–100.0)
Platelets: 322 10*3/uL (ref 150–400)
RBC: 2.72 MIL/uL — ABNORMAL LOW (ref 4.22–5.81)
RDW: 16.3 % — ABNORMAL HIGH (ref 11.5–15.5)
WBC: 10 10*3/uL (ref 4.0–10.5)
nRBC: 0 % (ref 0.0–0.2)

## 2023-01-06 LAB — BASIC METABOLIC PANEL
Anion gap: 7 (ref 5–15)
BUN: 19 mg/dL (ref 8–23)
CO2: 25 mmol/L (ref 22–32)
Calcium: 7.7 mg/dL — ABNORMAL LOW (ref 8.9–10.3)
Chloride: 102 mmol/L (ref 98–111)
Creatinine, Ser: 0.65 mg/dL (ref 0.61–1.24)
GFR, Estimated: >60 mL/min (ref >60)
Glucose, Bld: 116 mg/dL — ABNORMAL HIGH (ref 70–99)
Potassium: 3.8 mmol/L (ref 3.5–5.1)
Sodium: 134 mmol/L — ABNORMAL LOW (ref 135–145)

## 2023-01-06 LAB — CULTURE, BLOOD (ROUTINE X 2)

## 2023-01-06 LAB — TYPE AND SCREEN: Unit division: 0

## 2023-01-06 MED ORDER — IOHEXOL 300 MG/ML  SOLN
100.0000 mL | Freq: Once | INTRAMUSCULAR | Status: AC | PRN
  Administered 2023-01-06: 100 mL via INTRAVENOUS

## 2023-01-06 MED ORDER — IOHEXOL 9 MG/ML PO SOLN: 500.0000 mL | ORAL | Status: AC

## 2023-01-06 MED ORDER — SODIUM CHLORIDE (PF) 0.9 % IJ SOLN
INTRAMUSCULAR | Status: AC
  Filled 2023-01-06: qty 50

## 2023-01-06 MED ORDER — GADOBUTROL 1 MMOL/ML IV SOLN
6.0000 mL | Freq: Once | INTRAVENOUS | Status: AC | PRN
  Administered 2023-01-06: 6 mL via INTRAVENOUS

## 2023-01-06 MED ORDER — IOHEXOL 9 MG/ML PO SOLN
ORAL | Status: AC
  Filled 2023-01-06: qty 1000

## 2023-01-06 MED ORDER — SODIUM CHLORIDE 0.9 % IV SOLN: INTRAVENOUS | Status: AC

## 2023-01-06 NOTE — Evaluation (Signed)
Clinical/Bedside Swallow Evaluation Patient Details  Name: Albert Perez MRN: [id number] Date of Birth: Apr 01, 1951  Today's Date: 01/06/2023 Time: SLP Start Time (ACUTE ONLY): 1454 SLP Stop Time (ACUTE ONLY): 1535 SLP Time Calculation (min) (ACUTE ONLY): 41 min  Past Medical History:  Past Medical History:  Diagnosis Date   A-fib [facility])    PMH: 2000-2001   Arthritis    Cancer (HCC)    melanoma 2018   Diarrhea    due to medications   DVT (deep venous thrombosis) (HCC)    Left brachial vein DVT (01/30/19, following fracture)   Dysrhythmia    afib -    ETOH abuse    GERD (gastroesophageal reflux disease)    ocassional   History of radiation therapy    Right Hip 02/08/22-02/19/22-Dr. Antony Blackbird   History of radiation therapy    Right Pelvis-05/06/22-05/19/22- Dr. Antony Blackbird   Lung cancer [facility]) dx'd 02/26/2019   Metastatic cancer to bone (HCC) dx'd 02/26/2019   Sleep apnea    do not wear CPAP   Stroke (HCC) 2019   " mini"   Vertigo    Wears glasses    Past Surgical History:  Past Surgical History:  Procedure Laterality Date   ANKLE FRACTURE SURGERY Right 2003   COLONOSCOPY  2018   ELBOW SURGERY Left 1964   INGUINAL HERNIA REPAIR Bilateral 11/12/2020   Procedure: BILATERAL OPEN INGUINAL HERNIA REPAIR WITH MESH;  Surgeon: Abigail Miyamoto, MD;  Location: [facility] OR;  Service: General;  Laterality: Bilateral;   melanoma removal  04/2017   Lt shoulder   RETINAL DETACHMENT SURGERY Right 1971   TONSILLECTOMY     VIDEO BRONCHOSCOPY WITH ENDOBRONCHIAL NAVIGATION N/A 03/28/2019   Procedure: VIDEO BRONCHOSCOPY WITH ENDOBRONCHIAL NAVIGATION and Biopsies;  Surgeon: Leslye Peer, MD;  Location: [facility] OR;  Service: Thoracic;  Laterality: N/A;   HPI:  Per MD note "72 y.o. male with medical history significant of atrial fibrillation on apixaban, chronic pain secondary to stage IV NSCLC with involvement of ribs, pelvis and left humerus with pathologic fracture s/p palliative radiotherapy,  medication induced diarrhea, DVT, EtOH abuse, GERD, OSA not on CPAP, and vertigo who presented to the emergency department with hypotension, decreased appetite and multiple wounds that are oozing blood, particularly his right upper back." Swallow eval ordered as pt reports increased difficulty swallowing and problems with secretions.    Assessment / Plan / Recommendation  Clinical Impression  Pt without focal CN deficits nor indication of airway compromise with po observed *Ensure 6 ounces, water 4 ounces and  several bites of [place].  He reports choking on liquids at home and in the hospital while taking pills with liquids.  Today he received medications with applesauce from RN and reported this was much easier for him to swallow.   He demonstrated rotary mastication with adequate oral clearance.  Swallow was timely with clear voice throughout.  Issues with saliva reported, SLP did not observe pooled saliva in oral cavity. Encouraged him to swish and expectorate with water before and after meals for adequate viscous secretion clearance.   Recommend diet as tolerated and medicine with puree. Encouraged pt to continue medicine with applesauce - start and follow with water, and drink plenty of liquids to assure hydrated. In addition, advised pt brush his teeth at night *at least* due to some low grade aspiration of secretions.    Pt reports issue with sensing slowness of clearance of Ensure during po - indicating slow in the esophagus.  States this has been ongoing for a few weeks - but is stable.  Advised he and his wife monitor esophageal function and inform MD if dysphagia symptoms worsen given his h/o radiation treatment completed in November 2023 per wife. Reviewed antichoking device *Life Vac* and provided paperwork for wife/pt to consider purchasing for emergent use, especially given pt's osseous mets with potential negative consequence if he required heimlich manuever.  All education completed, no follow  up. Thanks for this order. SLP Visit Diagnosis: Dysphagia, oral phase (R13.11)    Aspiration Risk  Mild aspiration risk    Diet Recommendation Regular;Thin liquid    Liquid Administration via: Cup;No straw Medication Administration: Whole meds with puree Supervision: Staff to assist with self feeding Compensations: Slow rate;Small sips/bites (start intake with liquids) Postural Changes: Remain upright for at least 30 minutes after po intake;Seated upright at 90 degrees    Other  Recommendations Oral Care Recommendations: Oral care BID    Recommendations for follow up therapy are one component of a multi-disciplinary discharge planning process, led by the attending physician.  Recommendations may be updated based on patient status, additional functional criteria and insurance authorization.  Follow up Recommendations No SLP follow up    N/a  Assistance Recommended at Discharge    Functional Status Assessment Patient has not had a recent decline in their functional status  Frequency and Duration   N/a         Prognosis     N/a   Swallow Study   General Date of Onset: 01/06/23 HPI: Per MD note "72 y.o. male with medical history significant of atrial fibrillation on apixaban, chronic pain secondary to stage IV NSCLC with involvement of ribs, pelvis and left humerus with pathologic fracture s/p palliative radiotherapy, medication induced diarrhea, DVT, EtOH abuse, GERD, OSA not on CPAP, and vertigo who presented to the emergency department with hypotension, decreased appetite and multiple wounds that are oozing blood, particularly his right upper back." Swallow eval ordered as pt reports increased difficulty swallowing and problems with secretions. Diet Prior to this Study: Regular;Thin liquids (Level 0) Respiratory Status: Room air History of Recent Intubation: No Behavior/Cognition: Alert;Cooperative;Pleasant mood Oral Care Completed by SLP: No Oral Cavity - Dentition: Adequate  natural dentition Vision: Functional for self-feeding Baseline Vocal Quality: Normal Volitional Cough: Strong Volitional Swallow: Able to elicit    Oral/Motor/Sensory Function Overall Oral Motor/Sensory Function: Within functional limits   Ice Chips Ice chips: Not tested   Thin Liquid Thin Liquid: Within functional limits Presentation: Straw Other Comments: At times, pt winces with swallowing, reporting its difficult.  Endorses occasional issues with reflux for which he takes Tums if he is going to consume spicy foods.    Nectar Thick Nectar Thick Liquid: Within functional limits Presentation: Straw   Honey Thick Honey Thick Liquid: Not tested   Puree Puree: Not tested   Solid     Solid: Within functional limits Presentation: Marita Snellen 01/06/2023,4:48 PM  Rolena Infante, MS [facility] SLP Acute Rehab Services Office [phone]

## 2023-01-06 NOTE — Progress Notes (Signed)
Bedside swallow eval completed, full report to follow.  Pt without focal CN deficits nor indication of airway compromise with po observed *Ensure 6 ounces, water 4 ounces and  several bites of [place].  He reports choking on liquids at home (wife reports occurred when taking medicine) and  in the hospital.  Today he received medications with applesauce from RN and reported this was much easier for him to swallow.   Recommend diet as tolerated and medicine with puree. Encouraged pt to continue medicine with applesauce - start and follow with water, and drink plenty of liquids to assure hydrated. In addition, advised pt brush his teeth at night *at least* due to some low grade aspiration of secretions.    Pt reports issue with sensing slowness of clearance of Ensure during po - indicating esophageal.  States this has been ongoing for a few weeks - stable.  Advised he and his wife monitor esophageal function and inform MD if dysphagia symptoms worsen.     Albert Infante, MS [facility] SLP Acute [employer] [phone]

## 2023-01-06 NOTE — Progress Notes (Addendum)
PROGRESS NOTE    Albert Perez  ZOX:[id number]  DOB: Nov 01, 1950  DOA: 01/04/2023 PCP: Henrine Screws, MD Outpatient Specialists:   Hospital course:  72 y.o. male with medical history significant of atrial fibrillation on apixaban, chronic pain secondary to stage IV NSCLC with involvement of ribs, pelvis and left humerus with pathologic fracture s/p palliative radiotherapy, medication induced diarrhea, DVT, EtOH abuse, GERD, OSA not on CPAP, and vertigo who presented to the emergency department with hypotension, decreased appetite and multiple wounds that are oozing blood, particularly his right upper back.     Subjective:  Patient states pain is very well-controlled, is appreciative of pain management  Main concern today is intermittent sensation of dysphagia.  Notes that he has a hard time swallowing as well as sensation that food is getting stuck.  No vomiting.  Notes he does have increased secretions.  Notes this is been going on for a while   Objective: Vitals:   01/05/23 2045 01/06/23 0509 01/06/23 0907 01/06/23 1403  BP: (!) 95/56 (!) 92/59 124/63 113/61  Pulse: 68 71 74 69  Resp: 17 16 20 20   Temp: 97.8 F (36.6 C) 99.1 F (37.3 C) 99.4 F (37.4 C) 99.3 F (37.4 C)  TempSrc: Oral Oral Oral Oral  SpO2: 96% 95% 95% 100%  Weight:      Height:        Intake/Output Summary (Last 24 hours) at 01/06/2023 1705 Last data filed at 01/06/2023 1530 Gross per 24 hour  Intake 946.3 ml  Output 550 ml  Net 396.3 ml    Filed Weights   01/04/23 1356  Weight: 56.8 kg     Exam:  General: Chronically ill-appearing man lying in bed, more alert and appears more comfortable than yesterday with attentive family members at bedside Eyes: sclera anicteric, conjuctiva mild injection bilaterally CVS: S1-S2, regular  Respiratory:  decreased air entry bilaterally secondary to decreased inspiratory effort, rales at bases  GI: NABS, soft, NT  LE: Back is CDI however on review of images  from H&P yesterday oozing from multiple areas of his back.  Data Reviewed:  Basic Metabolic Panel: Recent Labs  Lab 01/04/23 1431 01/05/23 0739 01/06/23 0444  NA 135 134* 134*  K 4.1 3.9 3.8  CL 104 103 102  CO2 26 22 25   GLUCOSE 128* 103* 116*  BUN 25* 21 19  CREATININE 0.80 0.71 0.65  CALCIUM 7.7* 7.5* 7.7*     CBC: Recent Labs  Lab 01/04/23 1431 01/05/23 0739 01/05/23 2133 01/06/23 0444  WBC 10.7* 11.0*  --  10.0  NEUTROABS 8.8*  --   --   --   HGB 5.4* 7.8* 8.5* 8.2*  HCT 17.8* 24.3* 26.7* 25.3*  MCV 97.3 92.4  --  93.0  PLT 354 316  --  322      Scheduled Meds:  bacitracin  1 Application Topical BID   cyanocobalamin  1,000 mcg Oral Daily   feeding supplement  237 mL Oral BID BM   leptospermum manuka honey  1 Application Topical Daily   osimertinib mesylate  80 mg Oral Daily   oxyCODONE-acetaminophen  1 tablet Oral QID   rosuvastatin  5 mg Oral Daily   tepotinib hcl  225 mg Oral QODAY   Continuous Infusions:  sodium chloride 100 mL/hr at 01/06/23 8119     Assessment & Plan:   Symptomatic anemia Oozing Pressure Wound on Back H&H appear to be holding steady after 2 units PRBC, 8.2 today and it  was 8.5 yesterday Baseline hemoglobin appears to be around 10, last checked 3 months ago and April 2024 Acute anemia possibly secondary to blood loss given ongoing oozing of pressure sore of his back while he is anticoagulated. Apixaban is being held, will need to discuss pros and cons of reinitiating it with Dr. Shirline Frees especially given history of pulmonary emboli and having metastatic cancer.  Also has atrial fibrillation although concerns for secondary stroke prevention's are probably less they on for recurrent PE. Wound care nurse has been consulted  Dysphagia Appreciate evaluation by speech therapy Continue thin liquids as per recommendations with pured foods with medications  Acute on chronic pain Stage IV NSCLC Percocet 7.5 every 6 hours standing  with 5 mg oxycodone as needed for breakthrough pain has been effective MRI of hip and CT ordered per Dr. Shirline Frees are pending Tepmetko is being held Patient may well benefit from long-acting narcotics as an outpatient, being followed actively by palliative care  Atrial fibrillation H/oh pulmonary emboli Patient had been on apixaban for CHA2DS2-VASc 2 score of 5 and history of pulmonary emboli However given continuous oozing of wound on back decreased hemoglobin, will hold Not on any rate control medications for A-fib due to hypotension, rate is controlled at 50s  HTN Blood pressures in the 90s today, will provide gentle hydration given decreased p.o. intake Carvedilol is being held given low blood pressure Patient is apparently also written for Lasix and torsemide however unclear if he is taking, these are being held     DVT prophylaxis: SCD Code Status: DNR Family Communication: Patient's sister was at bedside throughout    Studies: CT CHEST W CONTRAST  Result Date: 01/06/2023 CLINICAL DATA:  Non-small cell lung cancer staging; * Tracking Code: BO * EXAM: CT CHEST, ABDOMEN, AND PELVIS WITH CONTRAST TECHNIQUE: Multidetector CT imaging of the chest, abdomen and pelvis was performed following the standard protocol during bolus administration of intravenous contrast. RADIATION DOSE REDUCTION: This exam was performed according to the departmental dose-optimization program which includes automated exposure control, adjustment of the mA and/or kV according to patient size and/or use of iterative reconstruction technique. CONTRAST:  OMNIPAQUE IOHEXOL 300 MG/ML  SOLN COMPARISON:  CT chest, abdomen and dated October 26, 2022 FINDINGS: CT CHEST FINDINGS Cardiovascular: Normal heart size. Small pericardial effusion. Normal caliber thoracic aorta with moderate atherosclerotic disease. Severe coronary artery calcifications. Mediastinum/Nodes: Esophagus and thyroid are unremarkable. Interval  increased size mediastinal lymph nodes. Reference subcarinal lymph node measuring 12 mm in short axis on series 2, image 33, previously 5 mm. Reference pre-vascular lymph node measuring 8 mm in short axis, previously 5 mm. Lungs/Pleura: Central airways are patent. Small amount of tracheal debris. New moderate loculated pleural fluid collection with pleural thickening and gas. Complete collapse of the right lower lobe, likely secondary to external compression and partial collapse of the right middle lobe. Small left pleural effusion is increased in size when compared with the prior exam and demonstrates mild pleural thickening. Spiculated nodule of the superior left lower lobe is unchanged in size, measuring 2.9 x 1.8 cm. Increased linear opacities of the left lower lobe and peripheral left upper lobe, likely due to atelectasis. Musculoskeletal: Stable comminuted pathologic fracture of the proximal left humerus. Unchanged lytic and sclerotic osseous lesions of the thoracic spine. CT ABDOMEN PELVIS FINDINGS Hepatobiliary: No focal liver abnormality is seen. No gallstones, gallbladder wall thickening, or biliary dilatation. Pancreas: Unremarkable. No pancreatic ductal dilatation or surrounding inflammatory changes. Spleen: Normal in size without  focal abnormality. Adrenals/Urinary Tract: Bilateral adrenal glands are unremarkable. Hydronephrosis or nephrolithiasis. Low-attenuation lesion of the upper pole the right kidney, consistent with a simple cyst. Additional bilateral low-attenuation renal lesions which are seen which are too small to accurately characterize but likely simple cysts, specific follow-up imaging is necessary for these renal lesions. Mild bladder wall thickening, decreased when compared with the prior exam. Stomach/Bowel: Stomach is within normal limits. No evidence of bowel wall thickening, distention, or inflammatory changes. Vascular/Lymphatic: Aortic atherosclerosis. No enlarged abdominal or  pelvic lymph nodes. Reproductive: Unremarkable. Other: No abdominal wall hernia or abnormality. No abdominopelvic ascites. Musculoskeletal: Unchanged lytic and sclerotic lesions of the pelvis and lumbar spine including large destructive osseous lesions of the right pelvis. IMPRESSION: 1. New moderate loculated right pleural fluid collection with associated pleural thickening and gas, concerning for empyema. 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 2. Stable spiculated solid nodule of the superior left lower lobe. 3. Small left pleural effusion is increased in size when compared with the prior exam and demonstrates mild pleural thickening. 4. Slight increased size of mediastinal lymph nodes, possibly reactive, although progressive metastatic disease can not be excluded. 5. Unchanged lytic and sclerotic osseous lesions. 6. Mild bladder wall thickening, decreased when compared with the prior exam and likely due to resolving cystitis. These results will be called to the ordering clinician or representative by the Radiologist Assistant, and communication documented in the PACS or [employer]. Electronically Signed   By: Allegra Lai M.D.   On: 01/06/2023 13:27   CT ABDOMEN PELVIS W CONTRAST  Result Date: 01/06/2023 CLINICAL DATA:  Non-small cell lung cancer staging; * Tracking Code: BO * EXAM: CT CHEST, ABDOMEN, AND PELVIS WITH CONTRAST TECHNIQUE: Multidetector CT imaging of the chest, abdomen and pelvis was performed following the standard protocol during bolus administration of intravenous contrast. RADIATION DOSE REDUCTION: This exam was performed according to the departmental dose-optimization program which includes automated exposure control, adjustment of the mA and/or kV according to patient size and/or use of iterative reconstruction technique. CONTRAST:  OMNIPAQUE IOHEXOL 300 MG/ML  SOLN COMPARISON:  CT chest, abdomen and dated October 26, 2022 FINDINGS: CT CHEST  FINDINGS Cardiovascular: Normal heart size. Small pericardial effusion. Normal caliber thoracic aorta with moderate atherosclerotic disease. Severe coronary artery calcifications. Mediastinum/Nodes: Esophagus and thyroid are unremarkable. Interval increased size mediastinal lymph nodes. Reference subcarinal lymph node measuring 12 mm in short axis on series 2, image 33, previously 5 mm. Reference pre-vascular lymph node measuring 8 mm in short axis, previously 5 mm. Lungs/Pleura: Central airways are patent. Small amount of tracheal debris. New moderate loculated pleural fluid collection with pleural thickening and gas. Complete collapse of the right lower lobe, likely secondary to external compression and partial collapse of the right middle lobe. Small left pleural effusion is increased in size when compared with the prior exam and demonstrates mild pleural thickening. Spiculated nodule of the superior left lower lobe is unchanged in size, measuring 2.9 x 1.8 cm. Increased linear opacities of the left lower lobe and peripheral left upper lobe, likely due to atelectasis. Musculoskeletal: Stable comminuted pathologic fracture of the proximal left humerus. Unchanged lytic and sclerotic osseous lesions of the thoracic spine. CT ABDOMEN PELVIS FINDINGS Hepatobiliary: No focal liver abnormality is seen. No gallstones, gallbladder wall thickening, or biliary dilatation. Pancreas: Unremarkable. No pancreatic ductal dilatation or surrounding inflammatory changes. Spleen: Normal in size without focal abnormality. Adrenals/Urinary Tract: Bilateral adrenal glands are unremarkable. Hydronephrosis or nephrolithiasis. Low-attenuation lesion  of the upper pole the right kidney, consistent with a simple cyst. Additional bilateral low-attenuation renal lesions which are seen which are too small to accurately characterize but likely simple cysts, specific follow-up imaging is necessary for these renal lesions. Mild bladder wall  thickening, decreased when compared with the prior exam. Stomach/Bowel: Stomach is within normal limits. No evidence of bowel wall thickening, distention, or inflammatory changes. Vascular/Lymphatic: Aortic atherosclerosis. No enlarged abdominal or pelvic lymph nodes. Reproductive: Unremarkable. Other: No abdominal wall hernia or abnormality. No abdominopelvic ascites. Musculoskeletal: Unchanged lytic and sclerotic lesions of the pelvis and lumbar spine including large destructive osseous lesions of the right pelvis. IMPRESSION: 1. New moderate loculated right pleural fluid collection with associated pleural thickening and gas, concerning for empyema. 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 2. Stable spiculated solid nodule of the superior left lower lobe. 3. Small left pleural effusion is increased in size when compared with the prior exam and demonstrates mild pleural thickening. 4. Slight increased size of mediastinal lymph nodes, possibly reactive, although progressive metastatic disease can not be excluded. 5. Unchanged lytic and sclerotic osseous lesions. 6. Mild bladder wall thickening, decreased when compared with the prior exam and likely due to resolving cystitis. These results will be called to the ordering clinician or representative by the Radiologist Assistant, and communication documented in the PACS or [employer]. Electronically Signed   By: Allegra Lai M.D.   On: 01/06/2023 13:27   MR HIP RIGHT W WO CONTRAST  Result Date: 01/06/2023 CLINICAL DATA:  Chronic right hip pain.  History of lung cancer. EXAM: MRI OF THE RIGHT HIP WITHOUT AND WITH CONTRAST TECHNIQUE: Multiplanar, multisequence MR imaging was performed both before and after administration of intravenous contrast. CONTRAST:  6mL GADAVIST GADOBUTROL 1 MMOL/ML IV SOLN COMPARISON:  10/26/2022 CT abdomen/pelvis, PET scan 07/19/2022 FINDINGS: Bones: No hip fracture or dislocation. 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 Extensive bone lesion involving the right ilium and ischium with bone destruction of the right ilium and involving the entirety of the acetabulum at high risk for a pathologic fracture. 2.4 cm bone lesion in the left superior ilium. Normal sacrum and sacroiliac joints. No SI joint widening or erosive changes. Articular cartilage and labrum Articular cartilage: Partial-thickness cartilage loss of the femoral head and acetabulum bilaterally. Labrum: Grossly intact, but evaluation is limited by lack of intraarticular fluid. Joint or bursal effusion Joint effusion:  No hip joint effusion.  No SI joint effusion. Bursae:  No bursal fluid. Muscles and tendons Edema throughout the gluteal muscles bilaterally, right worse than left. Edema throughout the right adductor musculature and left operator externus muscle. Other findings No pelvic free fluid. No fluid collection or hematoma. No inguinal lymphadenopathy. No inguinal hernia. Generalized anasarca. IMPRESSION: 1. Extensive metastatic disease involving the right ilium and ischium with bone destruction of the right ilium and involving the entirety of the acetabulum at high risk for a pathologic fracture. Metastatic bone lesion in the left superior ilium and right femoral head. 2. Edema throughout the gluteal muscles bilaterally, right worse than left. Edema throughout the right adductor musculature and left operator externus muscle. Differential considerations include infectious or inflammatory myositis versus denervation edema. 3. Mild osteoarthritis of bilateral hips. 4. 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 5. Anasarca. Electronically Signed   By: Elige Ko M.D.   On: 01/06/2023 11:34    Principal Problem:   Symptomatic anemia Active Problems:   History  of atrial fibrillation   Essential hypertension   Severe obstructive sleep apnea-hypopnea syndrome   Adenocarcinoma of left lung, stage 4  (HCC)   Tobacco abuse   Hyperlipidemia   Pulmonary emboli (HCC)   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)   ABLA (acute blood loss anemia)   Pressure injury of skin     Aryana Wonnacott Tublu Maceo Hernan, [facility]  If 7PM-7AM, please contact night-coverage [email or web address]   LOS: 1 day

## 2023-01-06 NOTE — Progress Notes (Signed)
   01/06/23 1954  BiPAP/CPAP/SIPAP  Reason BIPAP/CPAP not in use Non-compliant   Pt refuses cpap, does not wear at home.  Family in agreement.  RN aware.

## 2023-01-07 DIAGNOSIS — D649 Anemia, unspecified: Secondary | ICD-10-CM | POA: Diagnosis not present

## 2023-01-07 LAB — CBC WITH DIFFERENTIAL/PLATELET
Abs Immature Granulocytes: 0.1 10*3/uL — ABNORMAL HIGH (ref 0.00–0.07)
Basophils Absolute: 0 10*3/uL (ref 0.0–0.1)
Basophils Relative: 0 %
Eosinophils Absolute: 0 10*3/uL (ref 0.0–0.5)
Eosinophils Relative: 0 %
HCT: 23.7 % — ABNORMAL LOW (ref 39.0–52.0)
Hemoglobin: 7.5 g/dL — ABNORMAL LOW (ref 13.0–17.0)
Immature Granulocytes: 1 %
Lymphocytes Relative: 11 %
Lymphs Abs: 1.1 10*3/uL (ref 0.7–4.0)
MCH: 29.8 pg (ref 26.0–34.0)
MCHC: 31.6 g/dL (ref 30.0–36.0)
MCV: 94 fL (ref 80.0–100.0)
Monocytes Absolute: 0.9 10*3/uL (ref 0.1–1.0)
Monocytes Relative: 10 %
Neutro Abs: 7.4 10*3/uL (ref 1.7–7.7)
Neutrophils Relative %: 78 %
Platelets: 298 10*3/uL (ref 150–400)
RBC: 2.52 MIL/uL — ABNORMAL LOW (ref 4.22–5.81)
RDW: 16.1 % — ABNORMAL HIGH (ref 11.5–15.5)
WBC: 9.5 10*3/uL (ref 4.0–10.5)
nRBC: 0 % (ref 0.0–0.2)

## 2023-01-07 LAB — BASIC METABOLIC PANEL
Anion gap: 6 (ref 5–15)
BUN: 17 mg/dL (ref 8–23)
CO2: 24 mmol/L (ref 22–32)
Calcium: 7.2 mg/dL — ABNORMAL LOW (ref 8.9–10.3)
Chloride: 105 mmol/L (ref 98–111)
Creatinine, Ser: 0.6 mg/dL — ABNORMAL LOW (ref 0.61–1.24)
GFR, Estimated: >60 mL/min (ref >60)
Glucose, Bld: 120 mg/dL — ABNORMAL HIGH (ref 70–99)
Potassium: 4 mmol/L (ref 3.5–5.1)
Sodium: 135 mmol/L (ref 135–145)

## 2023-01-07 LAB — CULTURE, BLOOD (ROUTINE X 2)

## 2023-01-07 MED ORDER — SODIUM CHLORIDE 0.9 % IV SOLN: INTRAVENOUS | Status: DC

## 2023-01-07 NOTE — Progress Notes (Signed)
MD at bedside.... update provided in re: MEWS score.

## 2023-01-07 NOTE — Progress Notes (Signed)
   01/07/23 2225  BiPAP/CPAP/SIPAP  Reason BIPAP/CPAP not in use Non-compliant

## 2023-01-07 NOTE — Progress Notes (Signed)
PROGRESS NOTE    Albert Perez  ZOX:[id number]  DOB: October 20, 1950  DOA: 01/04/2023 PCP: Henrine Screws, MD Outpatient Specialists:   Hospital course:  72 y.o. male with medical history significant of atrial fibrillation on apixaban, chronic pain secondary to stage IV NSCLC with involvement of ribs, pelvis and left humerus with pathologic fracture s/p palliative radiotherapy, medication induced diarrhea, DVT, EtOH abuse, GERD, OSA not on CPAP, and vertigo who presented to the emergency department with hypotension, decreased appetite and multiple wounds that are oozing blood, particularly his right upper back.     Subjective:  Patient notes that that he had pain earlier today but when he got his extra as needed dose he felt much better.  Admits that he is not eating and drinking as much as he would like.  Reviewed his decrease in hemoglobin, patient states he does not believe he is still oozing from his back.  Denies any other known areas of bleeding.  Notes he generally feels ok as long as his pain is well-controlled   Objective: Vitals:   01/06/23 1403 01/06/23 2042 01/07/23 0605 01/07/23 1217  BP: 113/61 (!) 104/53 (!) 109/57 (!) 102/53  Pulse: 69 75 71 66  Resp: 20 16 14    Temp: 99.3 F (37.4 C) 99.2 F (37.3 C) 99.4 F (37.4 C) 98.6 F (37 C)  TempSrc: Oral Oral Oral Oral  SpO2: 100% 96% 94% 95%  Weight:      Height:        Intake/Output Summary (Last 24 hours) at 01/07/2023 1820 Last data filed at 01/07/2023 1400 Gross per 24 hour  Intake 1786.61 ml  Output 900 ml  Net 886.61 ml    Filed Weights   01/04/23 1356  Weight: 56.8 kg     Exam:  General: Chronically ill-appearing man lying in [place] bed with caring wife at bedside. Eyes: sclera anicteric, conjuctiva mild injection bilaterally CVS: S1-S2, regular  Respiratory:  decreased air entry bilaterally secondary to decreased inspiratory effort, rales at bases  GI: NABS, soft, NT  LE: Back is CDI however on  review of images from H&P yesterday oozing from multiple areas of his back.  Data Reviewed:  Basic Metabolic Panel: Recent Labs  Lab 01/04/23 1431 01/05/23 0739 01/06/23 0444 01/07/23 0458  NA 135 134* 134* 135  K 4.1 3.9 3.8 4.0  CL 104 103 102 105  CO2 26 22 25 24   GLUCOSE 128* 103* 116* 120*  BUN 25* 21 19 17   CREATININE 0.80 0.71 0.65 0.60*  CALCIUM 7.7* 7.5* 7.7* 7.2*     CBC: Recent Labs  Lab 01/04/23 1431 01/05/23 0739 01/05/23 2133 01/06/23 0444 01/07/23 0458  WBC 10.7* 11.0*  --  10.0 9.5  NEUTROABS 8.8*  --   --   --  7.4  HGB 5.4* 7.8* 8.5* 8.2* 7.5*  HCT 17.8* 24.3* 26.7* 25.3* 23.7*  MCV 97.3 92.4  --  93.0 94.0  PLT 354 316  --  322 298      Scheduled Meds:  bacitracin  1 Application Topical BID   cyanocobalamin  1,000 mcg Oral Daily   feeding supplement  237 mL Oral BID BM   leptospermum manuka honey  1 Application Topical Daily   osimertinib mesylate  80 mg Oral Daily   oxyCODONE-acetaminophen  1 tablet Oral QID   rosuvastatin  5 mg Oral Daily   tepotinib hcl  225 mg Oral QODAY   Continuous Infusions:  sodium chloride  Assessment & Plan:   Symptomatic anemia Oozing Pressure Wound on Back H&H has drifted down since yesterday, now down to 7.5 He is however hemodynamically stable and he has better blood pressure since he got IV fluids I wonder if the decrease in H&H is dilutional after the fluids, patient's urine is still quite dark will continue IV fluids. Per patient and RN, no clear source of bleeding, his wounds are no longer oozing as they used to. FOBT x 2 were negative. Will continue to trend H&H and transfuse if warranted  Continue to hold apixaban Discussed pros and cons of holding for anticoagulation in regards to recurrent or new strokes versus ongoing bleeding. Wound nurse continues to follow which we appreciate  Dysphagia Appreciate evaluation by speech therapy Continue thin liquids as per recommendations with  pured foods with medications  Acute on chronic pain Stage IV NSCLC Percocet 7.5 every 6 hours standing with 5 mg oxycodone as needed for breakthrough pain has been effective MRI of hip and CT ordered per Dr. Shirline Frees are pending Tepmetko is being held Patient may well benefit from long-acting narcotics as an outpatient, being followed actively by palliative care  Atrial fibrillation H/oh pulmonary emboli Patient had been on apixaban for CHA2DS2-VASc 2 score of 5 and history of pulmonary emboli However given continuous oozing of wound on back decreased hemoglobin, will hold Not on any rate control medications for A-fib due to hypotension, rate is controlled at 50s  HTN Blood pressures in the 90s today, will provide gentle hydration given decreased p.o. intake Carvedilol is being held given low blood pressure Patient is apparently also written for Lasix and torsemide however unclear if he is taking, these are being held     DVT prophylaxis: SCD Code Status: DNR Family Communication: Patient's sister was at bedside throughout    Studies: CT CHEST W CONTRAST  Result Date: 01/06/2023 CLINICAL DATA:  Non-small cell lung cancer staging; * Tracking Code: BO * EXAM: CT CHEST, ABDOMEN, AND PELVIS WITH CONTRAST TECHNIQUE: Multidetector CT imaging of the chest, abdomen and pelvis was performed following the standard protocol during bolus administration of intravenous contrast. RADIATION DOSE REDUCTION: This exam was performed according to the departmental dose-optimization program which includes automated exposure control, adjustment of the mA and/or kV according to patient size and/or use of iterative reconstruction technique. CONTRAST:  OMNIPAQUE IOHEXOL 300 MG/ML  SOLN COMPARISON:  CT chest, abdomen and dated October 26, 2022 FINDINGS: CT CHEST FINDINGS Cardiovascular: Normal heart size. Small pericardial effusion. Normal caliber thoracic aorta with moderate atherosclerotic disease. Severe  coronary artery calcifications. Mediastinum/Nodes: Esophagus and thyroid are unremarkable. Interval increased size mediastinal lymph nodes. Reference subcarinal lymph node measuring 12 mm in short axis on series 2, image 33, previously 5 mm. Reference pre-vascular lymph node measuring 8 mm in short axis, previously 5 mm. Lungs/Pleura: Central airways are patent. Small amount of tracheal debris. New moderate loculated pleural fluid collection with pleural thickening and gas. Complete collapse of the right lower lobe, likely secondary to external compression and partial collapse of the right middle lobe. Small left pleural effusion is increased in size when compared with the prior exam and demonstrates mild pleural thickening. Spiculated nodule of the superior left lower lobe is unchanged in size, measuring 2.9 x 1.8 cm. Increased linear opacities of the left lower lobe and peripheral left upper lobe, likely due to atelectasis. Musculoskeletal: Stable comminuted pathologic fracture of the proximal left humerus. Unchanged lytic and sclerotic osseous lesions of the thoracic spine.  CT ABDOMEN PELVIS FINDINGS Hepatobiliary: No focal liver abnormality is seen. No gallstones, gallbladder wall thickening, or biliary dilatation. Pancreas: Unremarkable. No pancreatic ductal dilatation or surrounding inflammatory changes. Spleen: Normal in size without focal abnormality. Adrenals/Urinary Tract: Bilateral adrenal glands are unremarkable. Hydronephrosis or nephrolithiasis. Low-attenuation lesion of the upper pole the right kidney, consistent with a simple cyst. Additional bilateral low-attenuation renal lesions which are seen which are too small to accurately characterize but likely simple cysts, specific follow-up imaging is necessary for these renal lesions. Mild bladder wall thickening, decreased when compared with the prior exam. Stomach/Bowel: Stomach is within normal limits. No evidence of bowel wall thickening, distention,  or inflammatory changes. Vascular/Lymphatic: Aortic atherosclerosis. No enlarged abdominal or pelvic lymph nodes. Reproductive: Unremarkable. Other: No abdominal wall hernia or abnormality. No abdominopelvic ascites. Musculoskeletal: Unchanged lytic and sclerotic lesions of the pelvis and lumbar spine including large destructive osseous lesions of the right pelvis. IMPRESSION: 1. New moderate loculated right pleural fluid collection with associated pleural thickening and gas, concerning for empyema. 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 2. Stable spiculated solid nodule of the superior left lower lobe. 3. Small left pleural effusion is increased in size when compared with the prior exam and demonstrates mild pleural thickening. 4. Slight increased size of mediastinal lymph nodes, possibly reactive, although progressive metastatic disease can not be excluded. 5. Unchanged lytic and sclerotic osseous lesions. 6. Mild bladder wall thickening, decreased when compared with the prior exam and likely due to resolving cystitis. These results will be called to the ordering clinician or representative by the Radiologist Assistant, and communication documented in the PACS or [employer]. Electronically Signed   By: Allegra Lai M.D.   On: 01/06/2023 13:27   CT ABDOMEN PELVIS W CONTRAST  Result Date: 01/06/2023 CLINICAL DATA:  Non-small cell lung cancer staging; * Tracking Code: BO * EXAM: CT CHEST, ABDOMEN, AND PELVIS WITH CONTRAST TECHNIQUE: Multidetector CT imaging of the chest, abdomen and pelvis was performed following the standard protocol during bolus administration of intravenous contrast. RADIATION DOSE REDUCTION: This exam was performed according to the departmental dose-optimization program which includes automated exposure control, adjustment of the mA and/or kV according to patient size and/or use of iterative reconstruction technique. CONTRAST:  OMNIPAQUE IOHEXOL  300 MG/ML  SOLN COMPARISON:  CT chest, abdomen and dated October 26, 2022 FINDINGS: CT CHEST FINDINGS Cardiovascular: Normal heart size. Small pericardial effusion. Normal caliber thoracic aorta with moderate atherosclerotic disease. Severe coronary artery calcifications. Mediastinum/Nodes: Esophagus and thyroid are unremarkable. Interval increased size mediastinal lymph nodes. Reference subcarinal lymph node measuring 12 mm in short axis on series 2, image 33, previously 5 mm. Reference pre-vascular lymph node measuring 8 mm in short axis, previously 5 mm. Lungs/Pleura: Central airways are patent. Small amount of tracheal debris. New moderate loculated pleural fluid collection with pleural thickening and gas. Complete collapse of the right lower lobe, likely secondary to external compression and partial collapse of the right middle lobe. Small left pleural effusion is increased in size when compared with the prior exam and demonstrates mild pleural thickening. Spiculated nodule of the superior left lower lobe is unchanged in size, measuring 2.9 x 1.8 cm. Increased linear opacities of the left lower lobe and peripheral left upper lobe, likely due to atelectasis. Musculoskeletal: Stable comminuted pathologic fracture of the proximal left humerus. Unchanged lytic and sclerotic osseous lesions of the thoracic spine. CT ABDOMEN PELVIS FINDINGS Hepatobiliary: No focal liver abnormality is seen. No gallstones, gallbladder  wall thickening, or biliary dilatation. Pancreas: Unremarkable. No pancreatic ductal dilatation or surrounding inflammatory changes. Spleen: Normal in size without focal abnormality. Adrenals/Urinary Tract: Bilateral adrenal glands are unremarkable. Hydronephrosis or nephrolithiasis. Low-attenuation lesion of the upper pole the right kidney, consistent with a simple cyst. Additional bilateral low-attenuation renal lesions which are seen which are too small to accurately characterize but likely simple  cysts, specific follow-up imaging is necessary for these renal lesions. Mild bladder wall thickening, decreased when compared with the prior exam. Stomach/Bowel: Stomach is within normal limits. No evidence of bowel wall thickening, distention, or inflammatory changes. Vascular/Lymphatic: Aortic atherosclerosis. No enlarged abdominal or pelvic lymph nodes. Reproductive: Unremarkable. Other: No abdominal wall hernia or abnormality. No abdominopelvic ascites. Musculoskeletal: Unchanged lytic and sclerotic lesions of the pelvis and lumbar spine including large destructive osseous lesions of the right pelvis. IMPRESSION: 1. New moderate loculated right pleural fluid collection with associated pleural thickening and gas, concerning for empyema. 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 2. Stable spiculated solid nodule of the superior left lower lobe. 3. Small left pleural effusion is increased in size when compared with the prior exam and demonstrates mild pleural thickening. 4. Slight increased size of mediastinal lymph nodes, possibly reactive, although progressive metastatic disease can not be excluded. 5. Unchanged lytic and sclerotic osseous lesions. 6. Mild bladder wall thickening, decreased when compared with the prior exam and likely due to resolving cystitis. These results will be called to the ordering clinician or representative by the Radiologist Assistant, and communication documented in the PACS or [employer]. Electronically Signed   By: Allegra Lai M.D.   On: 01/06/2023 13:27   MR HIP RIGHT W WO CONTRAST  Result Date: 01/06/2023 CLINICAL DATA:  Chronic right hip pain.  History of lung cancer. EXAM: MRI OF THE RIGHT HIP WITHOUT AND WITH CONTRAST TECHNIQUE: Multiplanar, multisequence MR imaging was performed both before and after administration of intravenous contrast. CONTRAST:  6mL GADAVIST GADOBUTROL 1 MMOL/ML IV SOLN COMPARISON:  10/26/2022 CT abdomen/pelvis, PET  scan 07/19/2022 FINDINGS: Bones: No hip fracture or dislocation. 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 Extensive bone lesion involving the right ilium and ischium with bone destruction of the right ilium and involving the entirety of the acetabulum at high risk for a pathologic fracture. 2.4 cm bone lesion in the left superior ilium. Normal sacrum and sacroiliac joints. No SI joint widening or erosive changes. Articular cartilage and labrum Articular cartilage: Partial-thickness cartilage loss of the femoral head and acetabulum bilaterally. Labrum: Grossly intact, but evaluation is limited by lack of intraarticular fluid. Joint or bursal effusion Joint effusion:  No hip joint effusion.  No SI joint effusion. Bursae:  No bursal fluid. Muscles and tendons Edema throughout the gluteal muscles bilaterally, right worse than left. Edema throughout the right adductor musculature and left operator externus muscle. Other findings No pelvic free fluid. No fluid collection or hematoma. No inguinal lymphadenopathy. No inguinal hernia. Generalized anasarca. IMPRESSION: 1. Extensive metastatic disease involving the right ilium and ischium with bone destruction of the right ilium and involving the entirety of the acetabulum at high risk for a pathologic fracture. Metastatic bone lesion in the left superior ilium and right femoral head. 2. Edema throughout the gluteal muscles bilaterally, right worse than left. Edema throughout the right adductor musculature and left operator externus muscle. Differential considerations include infectious or inflammatory myositis versus denervation edema. 3. Mild osteoarthritis of bilateral hips. 4. 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 5. Anasarca. Electronically Signed   By: Elige Ko M.D.   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

## 2023-01-08 DIAGNOSIS — D649 Anemia, unspecified: Secondary | ICD-10-CM | POA: Diagnosis not present

## 2023-01-08 LAB — CBC
HCT: 25.3 % — ABNORMAL LOW (ref 39.0–52.0)
Hemoglobin: 7.7 g/dL — ABNORMAL LOW (ref 13.0–17.0)
MCH: 29.5 pg (ref 26.0–34.0)
MCHC: 30.4 g/dL (ref 30.0–36.0)
MCV: 96.9 fL (ref 80.0–100.0)
Platelets: 285 10*3/uL (ref 150–400)
RBC: 2.61 MIL/uL — ABNORMAL LOW (ref 4.22–5.81)
RDW: 16.3 % — ABNORMAL HIGH (ref 11.5–15.5)
WBC: 9.9 10*3/uL (ref 4.0–10.5)
nRBC: 0 % (ref 0.0–0.2)

## 2023-01-08 LAB — BASIC METABOLIC PANEL
Anion gap: 5 (ref 5–15)
BUN: 13 mg/dL (ref 8–23)
CO2: 24 mmol/L (ref 22–32)
Calcium: 7.4 mg/dL — ABNORMAL LOW (ref 8.9–10.3)
Chloride: 105 mmol/L (ref 98–111)
Creatinine, Ser: 0.47 mg/dL — ABNORMAL LOW (ref 0.61–1.24)
GFR, Estimated: >60 mL/min (ref >60)
Glucose, Bld: 120 mg/dL — ABNORMAL HIGH (ref 70–99)
Potassium: 4 mmol/L (ref 3.5–5.1)
Sodium: 134 mmol/L — ABNORMAL LOW (ref 135–145)

## 2023-01-08 LAB — CULTURE, BLOOD (ROUTINE X 2)

## 2023-01-08 MED ORDER — SENNA 8.6 MG PO TABS
1.0000 | ORAL_TABLET | Freq: Every day | ORAL | Status: DC
  Administered 2023-01-08 – 2023-01-12 (×4): 8.6 mg via ORAL
  Filled 2023-01-08 (×4): qty 1

## 2023-01-08 NOTE — Progress Notes (Signed)
PROGRESS NOTE    Albert Perez  VQQ:[id number]  DOB: May 30, 1951  DOA: 01/04/2023 PCP: Henrine Screws, MD Outpatient Specialists:   Hospital course:  72 y.o. male with medical history significant of atrial fibrillation on apixaban, chronic pain secondary to stage IV NSCLC with involvement of ribs, pelvis and left humerus with pathologic fracture s/p palliative radiotherapy, medication induced diarrhea, DVT, EtOH abuse, GERD, OSA not on CPAP, and vertigo who presented to the emergency department with hypotension, decreased appetite and multiple wounds that are oozing blood, particularly his right upper back.     Subjective:  Pt reports NAOE. Feels well without any complaints, wants to sleep this morning.    Objective: Vitals:   01/06/23 1403 01/06/23 2042 01/07/23 0605 01/07/23 1217  BP: 113/61 (!) 104/53 (!) 109/57 (!) 102/53  Pulse: 69 75 71 66  Resp: 20 16 14    Temp: 99.3 F (37.4 C) 99.2 F (37.3 C) 99.4 F (37.4 C) 98.6 F (37 C)  TempSrc: Oral Oral Oral Oral  SpO2: 100% 96% 94% 95%  Weight:      Height:        Intake/Output Summary (Last 24 hours) at 01/07/2023 1820 Last data filed at 01/07/2023 1400 Gross per 24 hour  Intake 1786.61 ml  Output 900 ml  Net 886.61 ml    Filed Weights   01/04/23 1356  Weight: 56.8 kg     Exam:  General: Chronically ill-appearing man lying in [place] bed Eyes: sclera anicteric, conjuctiva mild injection bilaterally CVS: RRR Respiratory:  decreased air entry bilaterally secondary to decreased inspiratory effort, rales at bases  GI: NABS, soft, NT  LE: Back is CDI however on review of images from H&P yesterday oozing from multiple areas of his back.  Data Reviewed:  Basic Metabolic Panel: Recent Labs  Lab 01/04/23 1431 01/05/23 0739 01/06/23 0444 01/07/23 0458  NA 135 134* 134* 135  K 4.1 3.9 3.8 4.0  CL 104 103 102 105  CO2 26 22 25 24   GLUCOSE 128* 103* 116* 120*  BUN 25* 21 19 17   CREATININE 0.80 0.71 0.65  0.60*  CALCIUM 7.7* 7.5* 7.7* 7.2*     CBC: Recent Labs  Lab 01/04/23 1431 01/05/23 0739 01/05/23 2133 01/06/23 0444 01/07/23 0458  WBC 10.7* 11.0*  --  10.0 9.5  NEUTROABS 8.8*  --   --   --  7.4  HGB 5.4* 7.8* 8.5* 8.2* 7.5*  HCT 17.8* 24.3* 26.7* 25.3* 23.7*  MCV 97.3 92.4  --  93.0 94.0  PLT 354 316  --  322 298      Scheduled Meds:  bacitracin  1 Application Topical BID   cyanocobalamin  1,000 mcg Oral Daily   feeding supplement  237 mL Oral BID BM   leptospermum manuka honey  1 Application Topical Daily   osimertinib mesylate  80 mg Oral Daily   oxyCODONE-acetaminophen  1 tablet Oral QID   rosuvastatin  5 mg Oral Daily   tepotinib hcl  225 mg Oral QODAY   Continuous Infusions:  sodium chloride       Assessment & Plan:   Symptomatic anemia Oozing Pressure Wound on Back H&H on admission was 5.4 received 2 units with appropriate response.  He has not required any additional transfusions this hospitalization.  Etiology of his anemia is due to various pressure wounds particularly on his back.  He is hemodynamically stable.  His apixaban was held on admission, risks and benefits of this medication were discussed.  His hemoglobin is stable for several days at this point, primary reason for continued hospitalization is for work up of cancer as below.  -daily CBC, transfuse for < 7 -Wound nurse continues to follow which we appreciate -holding DOAC as below  Acute on chronic pain Stage IV NSCLC Moderate right loculated pleural effusion Percocet 7.5 every 6 hours standing with 5 mg oxycodone as needed for breakthrough pain has been effective.Patient may well benefit from long-acting narcotics as an outpatient, being followed actively by palliative care. MRI and CT ordered by Dr. 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 Additionally, there is a new moderate right  loculated pleural effusion with c/f empyema and bronchopleural fistula. Per Dr. Shirline Frees note, due to transportation difficulties wants to complete staging work up during this hospitalization, he will be available again on Monday to discuss results. Discussed results of new right sided effusion with oncology and determine need for additional work up. He is on medications that will suppress his immune system and may also be an infectious effusion, though currently no evidence of infection at this time.  -continue home osimertinib, tepotinib   Dysphagia Appreciate evaluation by speech therapy. Continue thin liquids as per recommendations with pured foods with medications  Atrial fibrillation Hx of pulmonary emboli Patient had been on apixaban for CHA2DS2-VASc 2 score of 5 and history of pulmonary emboli. However given continuous oozing of wound on back decreased hemoglobin, has been held -holding eliquis  HTN Blood pressures in the 90s 7/4, was gentle hydration given decreased p.o. intake, stopped 7/6.  Carvedilol is being held given low blood pressure. Patient is apparently also written for Lasix and torsemide however unclear if he is taking, these are being held. -holding coreg, lasix/torsemide  DVT prophylaxis: SCD Bowels: None recorded, started senna 7/6 Code Status: DNR Family Communication: None at bedside   Studies: CT CHEST W CONTRAST  Result Date: 01/06/2023 CLINICAL DATA:  Non-small cell lung cancer staging; * Tracking Code: BO * EXAM: CT CHEST, ABDOMEN, AND PELVIS WITH CONTRAST TECHNIQUE: Multidetector CT imaging of the chest, abdomen and pelvis was performed following the standard protocol during bolus administration of intravenous contrast. RADIATION DOSE REDUCTION: This exam was performed according to the departmental dose-optimization program which includes automated exposure control, adjustment of the mA and/or kV according to patient size and/or use of iterative  reconstruction technique. CONTRAST:  OMNIPAQUE IOHEXOL 300 MG/ML  SOLN COMPARISON:  CT chest, abdomen and dated October 26, 2022 FINDINGS: CT CHEST FINDINGS Cardiovascular: Normal heart size. Small pericardial effusion. Normal caliber thoracic aorta with moderate atherosclerotic disease. Severe coronary artery calcifications. Mediastinum/Nodes: Esophagus and thyroid are unremarkable. Interval increased size mediastinal lymph nodes. Reference subcarinal lymph node measuring 12 mm in short axis on series 2, image 33, previously 5 mm. Reference pre-vascular lymph node measuring 8 mm in short axis, previously 5 mm. Lungs/Pleura: Central airways are patent. Small amount of tracheal debris. New moderate loculated pleural fluid collection with pleural thickening and gas. Complete collapse of the right lower lobe, likely secondary to external compression and partial collapse of the right middle lobe. Small left pleural effusion is increased in size when compared with the prior exam and demonstrates mild pleural thickening. Spiculated nodule of the superior left lower lobe is unchanged in size, measuring 2.9 x 1.8 cm. Increased linear opacities of the left lower lobe and peripheral left upper lobe, likely due to atelectasis. Musculoskeletal: Stable comminuted  pathologic fracture of the proximal left humerus. Unchanged lytic and sclerotic osseous lesions of the thoracic spine. CT ABDOMEN PELVIS FINDINGS Hepatobiliary: No focal liver abnormality is seen. No gallstones, gallbladder wall thickening, or biliary dilatation. Pancreas: Unremarkable. No pancreatic ductal dilatation or surrounding inflammatory changes. Spleen: Normal in size without focal abnormality. Adrenals/Urinary Tract: Bilateral adrenal glands are unremarkable. Hydronephrosis or nephrolithiasis. Low-attenuation lesion of the upper pole the right kidney, consistent with a simple cyst. Additional bilateral low-attenuation renal lesions which are seen which are  too small to accurately characterize but likely simple cysts, specific follow-up imaging is necessary for these renal lesions. Mild bladder wall thickening, decreased when compared with the prior exam. Stomach/Bowel: Stomach is within normal limits. No evidence of bowel wall thickening, distention, or inflammatory changes. Vascular/Lymphatic: Aortic atherosclerosis. No enlarged abdominal or pelvic lymph nodes. Reproductive: Unremarkable. Other: No abdominal wall hernia or abnormality. No abdominopelvic ascites. Musculoskeletal: Unchanged lytic and sclerotic lesions of the pelvis and lumbar spine including large destructive osseous lesions of the right pelvis. IMPRESSION: 1. New moderate loculated right pleural fluid collection with associated pleural thickening and gas, concerning for empyema. 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 2. Stable spiculated solid nodule of the superior left lower lobe. 3. Small left pleural effusion is increased in size when compared with the prior exam and demonstrates mild pleural thickening. 4. Slight increased size of mediastinal lymph nodes, possibly reactive, although progressive metastatic disease can not be excluded. 5. Unchanged lytic and sclerotic osseous lesions. 6. Mild bladder wall thickening, decreased when compared with the prior exam and likely due to resolving cystitis. These results will be called to the ordering clinician or representative by the Radiologist Assistant, and communication documented in the PACS or [employer]. Electronically Signed   By: Allegra Lai M.D.   On: 01/06/2023 13:27   CT ABDOMEN PELVIS W CONTRAST  Result Date: 01/06/2023 CLINICAL DATA:  Non-small cell lung cancer staging; * Tracking Code: BO * EXAM: CT CHEST, ABDOMEN, AND PELVIS WITH CONTRAST TECHNIQUE: Multidetector CT imaging of the chest, abdomen and pelvis was performed following the standard protocol during bolus administration of intravenous  contrast. RADIATION DOSE REDUCTION: This exam was performed according to the departmental dose-optimization program which includes automated exposure control, adjustment of the mA and/or kV according to patient size and/or use of iterative reconstruction technique. CONTRAST:  OMNIPAQUE IOHEXOL 300 MG/ML  SOLN COMPARISON:  CT chest, abdomen and dated October 26, 2022 FINDINGS: CT CHEST FINDINGS Cardiovascular: Normal heart size. Small pericardial effusion. Normal caliber thoracic aorta with moderate atherosclerotic disease. Severe coronary artery calcifications. Mediastinum/Nodes: Esophagus and thyroid are unremarkable. Interval increased size mediastinal lymph nodes. Reference subcarinal lymph node measuring 12 mm in short axis on series 2, image 33, previously 5 mm. Reference pre-vascular lymph node measuring 8 mm in short axis, previously 5 mm. Lungs/Pleura: Central airways are patent. Small amount of tracheal debris. New moderate loculated pleural fluid collection with pleural thickening and gas. Complete collapse of the right lower lobe, likely secondary to external compression and partial collapse of the right middle lobe. Small left pleural effusion is increased in size when compared with the prior exam and demonstrates mild pleural thickening. Spiculated nodule of the superior left lower lobe is unchanged in size, measuring 2.9 x 1.8 cm. Increased linear opacities of the left lower lobe and peripheral left upper lobe, likely due to atelectasis. Musculoskeletal: Stable comminuted pathologic fracture of the proximal left humerus. Unchanged lytic and sclerotic osseous lesions of  the thoracic spine. CT ABDOMEN PELVIS FINDINGS Hepatobiliary: No focal liver abnormality is seen. No gallstones, gallbladder wall thickening, or biliary dilatation. Pancreas: Unremarkable. No pancreatic ductal dilatation or surrounding inflammatory changes. Spleen: Normal in size without focal abnormality. Adrenals/Urinary Tract:  Bilateral adrenal glands are unremarkable. Hydronephrosis or nephrolithiasis. Low-attenuation lesion of the upper pole the right kidney, consistent with a simple cyst. Additional bilateral low-attenuation renal lesions which are seen which are too small to accurately characterize but likely simple cysts, specific follow-up imaging is necessary for these renal lesions. Mild bladder wall thickening, decreased when compared with the prior exam. Stomach/Bowel: Stomach is within normal limits. No evidence of bowel wall thickening, distention, or inflammatory changes. Vascular/Lymphatic: Aortic atherosclerosis. No enlarged abdominal or pelvic lymph nodes. Reproductive: Unremarkable. Other: No abdominal wall hernia or abnormality. No abdominopelvic ascites. Musculoskeletal: Unchanged lytic and sclerotic lesions of the pelvis and lumbar spine including large destructive osseous lesions of the right pelvis. IMPRESSION: 1. New moderate loculated right pleural fluid collection with associated pleural thickening and gas, concerning for empyema. 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 2. Stable spiculated solid nodule of the superior left lower lobe. 3. Small left pleural effusion is increased in size when compared with the prior exam and demonstrates mild pleural thickening. 4. Slight increased size of mediastinal lymph nodes, possibly reactive, although progressive metastatic disease can not be excluded. 5. Unchanged lytic and sclerotic osseous lesions. 6. Mild bladder wall thickening, decreased when compared with the prior exam and likely due to resolving cystitis. These results will be called to the ordering clinician or representative by the Radiologist Assistant, and communication documented in the PACS or [employer]. Electronically Signed   By: Allegra Lai M.D.   On: 01/06/2023 13:27   MR HIP RIGHT W WO CONTRAST  Result Date: 01/06/2023 CLINICAL DATA:  Chronic right hip pain.   History of lung cancer. EXAM: MRI OF THE RIGHT HIP WITHOUT AND WITH CONTRAST TECHNIQUE: Multiplanar, multisequence MR imaging was performed both before and after administration of intravenous contrast. CONTRAST:  6mL GADAVIST GADOBUTROL 1 MMOL/ML IV SOLN COMPARISON:  10/26/2022 CT abdomen/pelvis, PET scan 07/19/2022 FINDINGS: Bones: No hip fracture or dislocation. 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 Extensive bone lesion involving the right ilium and ischium with bone destruction of the right ilium and involving the entirety of the acetabulum at high risk for a pathologic fracture. 2.4 cm bone lesion in the left superior ilium. Normal sacrum and sacroiliac joints. No SI joint widening or erosive changes. Articular cartilage and labrum Articular cartilage: Partial-thickness cartilage loss of the femoral head and acetabulum bilaterally. Labrum: Grossly intact, but evaluation is limited by lack of intraarticular fluid. Joint or bursal effusion Joint effusion:  No hip joint effusion.  No SI joint effusion. Bursae:  No bursal fluid. Muscles and tendons Edema throughout the gluteal muscles bilaterally, right worse than left. Edema throughout the right adductor musculature and left operator externus muscle. Other findings No pelvic free fluid. No fluid collection or hematoma. No inguinal lymphadenopathy. No inguinal hernia. Generalized anasarca. IMPRESSION: 1. Extensive metastatic disease involving the right ilium and ischium with bone destruction of the right ilium and involving the entirety of the acetabulum at high risk for a pathologic fracture. Metastatic bone lesion in the left superior ilium and right femoral head. 2. Edema throughout the gluteal muscles bilaterally, right worse than left. Edema throughout the right adductor musculature and left operator externus muscle. Differential considerations include infectious or inflammatory  myositis versus denervation edema. 3. Mild osteoarthritis  of bilateral hips. 4. 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 5. Anasarca. Electronically Signed   By: Elige Ko M.D.   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

## 2023-01-08 NOTE — Progress Notes (Signed)
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 The bony lesions are unchanged I would defer to primary oncologist to discuss with patient regarding treatment I will cancel his imaging study appointment next week since they already done on admission  New pleural effusion The patient is tachypneic There is concern for possible empyema Recommend pulmonary consult  Severe protein calorie malnutrition Recommend frequent small meals and dietitian review  Poor mobility with bedsores Continue nursing care  Severe anemia Likely due to anemia chronic illness along with metastatic disease in his bone Monitor closely Transfuse if less than 7  Goals of care discussion I felt that the patient and his wife have unrealistic expectations The patient is bedbound with significant cachexia Recommend palliative care consult  Discharge planning He would likely be here until early next week  All questions were answered. The patient knows to call the clinic with any problems, questions or concerns.   The total time spent in the appointment was 60 minutes encounter with patients including review of chart and various tests results, discussions about plan of care and coordination of care plan  Artis Delay, MD 01/08/2023 1:16 PM  Subjective:  I was consulted as the patient was admitted to the hospital due to pain and significant decline I have reviewed his chart He has been receiving treatment under the care of Dr. Arbutus Ped He is due for repeat staging scan next week Due to progressive weakness, he was brought in through the emergency department Multiple imaging studies were performed He has significant pain on the right leg and has been very weak He has very poor appetite and routinely skips  meals The patient looks extremely frail and cachectic His pain is reasonably controlled since admission  Objective:  Vitals:   01/07/23 2006 01/08/23 0539  BP: (!) 107/55 101/70  Pulse: 67 87  Resp: 16 15  Temp: 98.7 F (37.1 C) 98.7 F (37.1 C)  SpO2: 96% 94%     Intake/Output Summary (Last 24 hours) at 01/08/2023 1316 Last data filed at 01/08/2023 0456 Gross per 24 hour  Intake 1382.85 ml  Output 200 ml  Net 1182.85 ml    GENERAL:alert, looks frail and cachectic SKIN: He looks pale.  He has bilateral bandages placed on both legs due to presence of pressure sores EYES: His right eye look deviated,conjunctiva are pale and non-injected, sclera clear OROPHARYNX: Dry mucous membrane NECK: supple, thyroid normal size, non-tender, without nodularity LYMPH:  no palpable lymphadenopathy in the cervical, axillary or inguinal LUNGS: Breathing effort with shallow breath sounds bilaterally HEART: regular rate & rhythm and no murmurs with mild bilateral lower extremity edema ABDOMEN:abdomen soft, non-tender and normal bowel sounds Musculoskeletal:no cyanosis of digits and no clubbing  NEURO: alert & oriented x 3 with fluent speech, very weak overall   Labs:  Recent Labs    10/26/22 1226 01/04/23 1431 01/05/23 0739 01/06/23 0444 01/07/23 0458 01/08/23 0456  NA 134* 135 134* 134* 135 134*  K 4.9 4.1 3.9 3.8 4.0 4.0  CL 104 104 103 102 105 105  CO2 26 26 22 25 24 24   GLUCOSE 94 128* 103* 116* 120* 120*  BUN 20 25* 21 19 17 13   CREATININE 0.95 0.80 0.71 0.65 0.60* 0.47*  CALCIUM 8.1* 7.7* 7.5* 7.7* 7.2* 7.4*  GFRNONAA >60 >60 >60 >60 >60 >60  PROT 4.8* 5.0* 4.6*  --   --   --   ALBUMIN 2.2* <1.5* <1.5*  --   --   --   AST 11* 12* 13*  --   --   --   ALT 9 14 13   --   --   --   ALKPHOS 140* 132* 117  --   --   --   BILITOT 0.2* 0.4 0.6  --   --   --     Studies: I personally reviewed his CT imaging CT CHEST W CONTRAST  Result Date: 01/06/2023 CLINICAL DATA:  Non-small cell  lung cancer staging; * Tracking Code: BO * EXAM: CT CHEST, ABDOMEN, AND PELVIS WITH CONTRAST TECHNIQUE: Multidetector CT imaging of the chest, abdomen and pelvis was performed following the standard protocol during bolus administration of intravenous contrast. RADIATION DOSE REDUCTION: This exam was performed according to the departmental dose-optimization program which includes automated exposure control, adjustment of the mA and/or kV according to patient size and/or use of iterative reconstruction technique. CONTRAST:  OMNIPAQUE IOHEXOL 300 MG/ML  SOLN COMPARISON:  CT chest, abdomen and dated October 26, 2022 FINDINGS: CT CHEST FINDINGS Cardiovascular: Normal heart size. Small pericardial effusion. Normal caliber thoracic aorta with moderate atherosclerotic disease. Severe coronary artery calcifications. Mediastinum/Nodes: Esophagus and thyroid are unremarkable. Interval increased size mediastinal lymph nodes. Reference subcarinal lymph node measuring 12 mm in short axis on series 2, image 33, previously 5 mm. Reference pre-vascular lymph node measuring 8 mm in short axis, previously 5 mm. Lungs/Pleura: Central airways are patent. Small amount of tracheal debris. New moderate loculated pleural fluid collection with pleural thickening and gas. Complete collapse of the right lower lobe, likely secondary to external compression and partial collapse of the right middle lobe. Small left pleural effusion is increased in size when compared with the prior exam and demonstrates mild pleural thickening. Spiculated nodule of the superior left lower lobe is unchanged in size, measuring 2.9 x 1.8 cm. Increased linear opacities of the left lower lobe and peripheral left upper lobe, likely due to atelectasis. Musculoskeletal: Stable comminuted pathologic fracture of the proximal left humerus. Unchanged lytic and sclerotic osseous lesions of the thoracic spine. CT ABDOMEN PELVIS FINDINGS Hepatobiliary: No focal liver  abnormality is seen. No gallstones, gallbladder wall thickening, or biliary dilatation. Pancreas: Unremarkable. No pancreatic ductal dilatation or surrounding inflammatory changes. Spleen: Normal in size without focal abnormality. Adrenals/Urinary Tract: Bilateral adrenal glands are unremarkable. Hydronephrosis or nephrolithiasis. Low-attenuation lesion of the upper pole the right kidney, consistent with a simple cyst. Additional bilateral low-attenuation renal lesions which are seen which are too small to accurately characterize but likely simple cysts, specific follow-up imaging is necessary for these renal lesions. Mild bladder wall thickening, decreased when compared with the prior exam. Stomach/Bowel: Stomach is within normal limits. No evidence of bowel wall thickening, distention, or inflammatory changes. Vascular/Lymphatic: Aortic atherosclerosis. No enlarged abdominal or pelvic lymph nodes. Reproductive: Unremarkable. Other: No abdominal wall hernia or abnormality. No abdominopelvic ascites. Musculoskeletal: Unchanged lytic and sclerotic lesions of the pelvis and lumbar spine including large destructive osseous lesions of the right pelvis. IMPRESSION: 1. New moderate loculated right pleural fluid collection with associated pleural thickening and gas, concerning for empyema. 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 2. Stable spiculated solid nodule of the superior left lower lobe. 3. Small left pleural effusion is increased in size when compared with the prior exam and demonstrates  mild pleural thickening. 4. Slight increased size of mediastinal lymph nodes, possibly reactive, although progressive metastatic disease can not be excluded. 5. Unchanged lytic and sclerotic osseous lesions. 6. Mild bladder wall thickening, decreased when compared with the prior exam and likely due to resolving cystitis. These results will be called to the ordering clinician or representative by the  Radiologist Assistant, and communication documented in the PACS or [employer]. Electronically Signed   By: Allegra Lai M.D.   On: 01/06/2023 13:27   CT ABDOMEN PELVIS W CONTRAST  Result Date: 01/06/2023 CLINICAL DATA:  Non-small cell lung cancer staging; * Tracking Code: BO * EXAM: CT CHEST, ABDOMEN, AND PELVIS WITH CONTRAST TECHNIQUE: Multidetector CT imaging of the chest, abdomen and pelvis was performed following the standard protocol during bolus administration of intravenous contrast. RADIATION DOSE REDUCTION: This exam was performed according to the departmental dose-optimization program which includes automated exposure control, adjustment of the mA and/or kV according to patient size and/or use of iterative reconstruction technique. CONTRAST:  OMNIPAQUE IOHEXOL 300 MG/ML  SOLN COMPARISON:  CT chest, abdomen and dated October 26, 2022 FINDINGS: CT CHEST FINDINGS Cardiovascular: Normal heart size. Small pericardial effusion. Normal caliber thoracic aorta with moderate atherosclerotic disease. Severe coronary artery calcifications. Mediastinum/Nodes: Esophagus and thyroid are unremarkable. Interval increased size mediastinal lymph nodes. Reference subcarinal lymph node measuring 12 mm in short axis on series 2, image 33, previously 5 mm. Reference pre-vascular lymph node measuring 8 mm in short axis, previously 5 mm. Lungs/Pleura: Central airways are patent. Small amount of tracheal debris. New moderate loculated pleural fluid collection with pleural thickening and gas. Complete collapse of the right lower lobe, likely secondary to external compression and partial collapse of the right middle lobe. Small left pleural effusion is increased in size when compared with the prior exam and demonstrates mild pleural thickening. Spiculated nodule of the superior left lower lobe is unchanged in size, measuring 2.9 x 1.8 cm. Increased linear opacities of the left lower lobe and peripheral left upper  lobe, likely due to atelectasis. Musculoskeletal: Stable comminuted pathologic fracture of the proximal left humerus. Unchanged lytic and sclerotic osseous lesions of the thoracic spine. CT ABDOMEN PELVIS FINDINGS Hepatobiliary: No focal liver abnormality is seen. No gallstones, gallbladder wall thickening, or biliary dilatation. Pancreas: Unremarkable. No pancreatic ductal dilatation or surrounding inflammatory changes. Spleen: Normal in size without focal abnormality. Adrenals/Urinary Tract: Bilateral adrenal glands are unremarkable. Hydronephrosis or nephrolithiasis. Low-attenuation lesion of the upper pole the right kidney, consistent with a simple cyst. Additional bilateral low-attenuation renal lesions which are seen which are too small to accurately characterize but likely simple cysts, specific follow-up imaging is necessary for these renal lesions. Mild bladder wall thickening, decreased when compared with the prior exam. Stomach/Bowel: Stomach is within normal limits. No evidence of bowel wall thickening, distention, or inflammatory changes. Vascular/Lymphatic: Aortic atherosclerosis. No enlarged abdominal or pelvic lymph nodes. Reproductive: Unremarkable. Other: No abdominal wall hernia or abnormality. No abdominopelvic ascites. Musculoskeletal: Unchanged lytic and sclerotic lesions of the pelvis and lumbar spine including large destructive osseous lesions of the right pelvis. IMPRESSION: 1. New moderate loculated right pleural fluid collection with associated pleural thickening and gas, concerning for empyema. 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 2. Stable spiculated solid nodule of the superior left lower lobe. 3. Small left pleural effusion is increased in size when compared with the prior exam and demonstrates mild pleural thickening. 4. Slight increased size of mediastinal lymph nodes, possibly reactive, although  progressive metastatic disease can not be excluded. 5.  Unchanged lytic and sclerotic osseous lesions. 6. Mild bladder wall thickening, decreased when compared with the prior exam and likely due to resolving cystitis. These results will be called to the ordering clinician or representative by the Radiologist Assistant, and communication documented in the PACS or [employer]. Electronically Signed   By: Allegra Lai M.D.   On: 01/06/2023 13:27   MR HIP RIGHT W WO CONTRAST  Result Date: 01/06/2023 CLINICAL DATA:  Chronic right hip pain.  History of lung cancer. EXAM: MRI OF THE RIGHT HIP WITHOUT AND WITH CONTRAST TECHNIQUE: Multiplanar, multisequence MR imaging was performed both before and after administration of intravenous contrast. CONTRAST:  6mL GADAVIST GADOBUTROL 1 MMOL/ML IV SOLN COMPARISON:  10/26/2022 CT abdomen/pelvis, PET scan 07/19/2022 FINDINGS: Bones: No hip fracture or dislocation. 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 Extensive bone lesion involving the right ilium and ischium with bone destruction of the right ilium and involving the entirety of the acetabulum at high risk for a pathologic fracture. 2.4 cm bone lesion in the left superior ilium. Normal sacrum and sacroiliac joints. No SI joint widening or erosive changes. Articular cartilage and labrum Articular cartilage: Partial-thickness cartilage loss of the femoral head and acetabulum bilaterally. Labrum: Grossly intact, but evaluation is limited by lack of intraarticular fluid. Joint or bursal effusion Joint effusion:  No hip joint effusion.  No SI joint effusion. Bursae:  No bursal fluid. Muscles and tendons Edema throughout the gluteal muscles bilaterally, right worse than left. Edema throughout the right adductor musculature and left operator externus muscle. Other findings No pelvic free fluid. No fluid collection or hematoma. No inguinal lymphadenopathy. No inguinal hernia. Generalized anasarca. IMPRESSION: 1. Extensive metastatic disease involving the right  ilium and ischium with bone destruction of the right ilium and involving the entirety of the acetabulum at high risk for a pathologic fracture. Metastatic bone lesion in the left superior ilium and right femoral head. 2. Edema throughout the gluteal muscles bilaterally, right worse than left. Edema throughout the right adductor musculature and left operator externus muscle. Differential considerations include infectious or inflammatory myositis versus denervation edema. 3. Mild osteoarthritis of bilateral hips. 4. 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 5. Anasarca. Electronically Signed   By: Elige Ko M.D.   On: 01/06/2023 11:34   DG Chest Port 1 View  Result Date: 01/04/2023 CLINICAL DATA:  Decreased appetite, hypotensive EXAM: PORTABLE CHEST 1 VIEW COMPARISON:  03/28/2019 chest radiograph, correlation is also made with 10/26/2022 CT chest FINDINGS: Unchanged cardiac and mediastinal contours, with aortic atherosclerosis. Layering right pleural effusion, likely moderate, with associated atelectasis. No focal opacity in the left lung. No pneumothorax. No acute osseous abnormality. IMPRESSION: Layering right pleural effusion, likely moderate, with associated atelectasis. Electronically Signed   By: Wiliam Ke M.D.   On: 01/04/2023 16:00

## 2023-01-08 NOTE — Progress Notes (Signed)
   01/08/23 2315  BiPAP/CPAP/SIPAP  Reason BIPAP/CPAP not in use Non-compliant

## 2023-01-09 DIAGNOSIS — R627 Adult failure to thrive: Secondary | ICD-10-CM

## 2023-01-09 DIAGNOSIS — C3492 Malignant neoplasm of unspecified part of left bronchus or lung: Secondary | ICD-10-CM | POA: Diagnosis not present

## 2023-01-09 DIAGNOSIS — Z515 Encounter for palliative care: Secondary | ICD-10-CM

## 2023-01-09 DIAGNOSIS — D62 Acute posthemorrhagic anemia: Secondary | ICD-10-CM | POA: Diagnosis not present

## 2023-01-09 DIAGNOSIS — R4589 Other symptoms and signs involving emotional state: Secondary | ICD-10-CM

## 2023-01-09 DIAGNOSIS — G893 Neoplasm related pain (acute) (chronic): Secondary | ICD-10-CM

## 2023-01-09 DIAGNOSIS — C7951 Secondary malignant neoplasm of bone: Secondary | ICD-10-CM

## 2023-01-09 DIAGNOSIS — L8990 Pressure ulcer of unspecified site, unspecified stage: Secondary | ICD-10-CM

## 2023-01-09 DIAGNOSIS — Z7189 Other specified counseling: Secondary | ICD-10-CM

## 2023-01-09 DIAGNOSIS — D649 Anemia, unspecified: Secondary | ICD-10-CM | POA: Diagnosis not present

## 2023-01-09 DIAGNOSIS — Z79899 Other long term (current) drug therapy: Secondary | ICD-10-CM

## 2023-01-09 DIAGNOSIS — C3490 Malignant neoplasm of unspecified part of unspecified bronchus or lung: Secondary | ICD-10-CM

## 2023-01-09 LAB — COMPREHENSIVE METABOLIC PANEL
ALT: 19 U/L (ref 0–44)
AST: 24 U/L (ref 15–41)
Albumin: <1.5 g/dL — ABNORMAL LOW (ref 3.5–5.0)
Alkaline Phosphatase: 117 U/L (ref 38–126)
Anion gap: 4 — ABNORMAL LOW (ref 5–15)
BUN: 13 mg/dL (ref 8–23)
CO2: 25 mmol/L (ref 22–32)
Calcium: 7.6 mg/dL — ABNORMAL LOW (ref 8.9–10.3)
Chloride: 103 mmol/L (ref 98–111)
Creatinine, Ser: 0.54 mg/dL — ABNORMAL LOW (ref 0.61–1.24)
GFR, Estimated: >60 mL/min (ref >60)
Glucose, Bld: 104 mg/dL — ABNORMAL HIGH (ref 70–99)
Potassium: 4 mmol/L (ref 3.5–5.1)
Sodium: 132 mmol/L — ABNORMAL LOW (ref 135–145)
Total Bilirubin: 0.6 mg/dL (ref 0.3–1.2)
Total Protein: 4.5 g/dL — ABNORMAL LOW (ref 6.5–8.1)

## 2023-01-09 LAB — CBC
HCT: 26.3 % — ABNORMAL LOW (ref 39.0–52.0)
Hemoglobin: 8 g/dL — ABNORMAL LOW (ref 13.0–17.0)
MCH: 29 pg (ref 26.0–34.0)
MCHC: 30.4 g/dL (ref 30.0–36.0)
MCV: 95.3 fL (ref 80.0–100.0)
Platelets: 294 10*3/uL (ref 150–400)
RBC: 2.76 MIL/uL — ABNORMAL LOW (ref 4.22–5.81)
RDW: 15.9 % — ABNORMAL HIGH (ref 11.5–15.5)
WBC: 10.6 10*3/uL — ABNORMAL HIGH (ref 4.0–10.5)
nRBC: 0 % (ref 0.0–0.2)

## 2023-01-09 LAB — CULTURE, BLOOD (ROUTINE X 2): Culture: NO GROWTH

## 2023-01-09 LAB — IRON AND TIBC: Iron: 10 ug/dL — ABNORMAL LOW (ref 45–182)

## 2023-01-09 LAB — MAGNESIUM: Magnesium: 1.7 mg/dL (ref 1.7–2.4)

## 2023-01-09 MED ORDER — VANCOMYCIN HCL 1250 MG/250ML IV SOLN
1250.0000 mg | INTRAVENOUS | Status: DC
  Administered 2023-01-09 – 2023-01-11 (×3): 1250 mg via INTRAVENOUS
  Filled 2023-01-09 (×4): qty 250

## 2023-01-09 MED ORDER — PIPERACILLIN-TAZOBACTAM 3.375 G IVPB
3.3750 g | Freq: Three times a day (TID) | INTRAVENOUS | Status: DC
  Administered 2023-01-09 – 2023-01-12 (×9): 3.375 g via INTRAVENOUS
  Filled 2023-01-09 (×9): qty 50

## 2023-01-09 MED ORDER — MORPHINE SULFATE 1 MG/ML IV SOLN PCA
INTRAVENOUS | Status: DC
  Administered 2023-01-09: 8.86 mg via INTRAVENOUS
  Administered 2023-01-10 (×2): 6 mg via INTRAVENOUS
  Filled 2023-01-09 (×2): qty 30

## 2023-01-09 MED ORDER — LACTATED RINGERS IV BOLUS
1000.0000 mL | Freq: Once | INTRAVENOUS | Status: AC
  Administered 2023-01-09: 1000 mL via INTRAVENOUS

## 2023-01-09 MED ORDER — DIPHENHYDRAMINE HCL 50 MG/ML IJ SOLN: 12.5000 mg | Freq: Four times a day (QID) | INTRAMUSCULAR | Status: DC | PRN

## 2023-01-09 MED ORDER — DIPHENHYDRAMINE HCL 12.5 MG/5ML PO ELIX: 12.5000 mg | ORAL_SOLUTION | Freq: Four times a day (QID) | ORAL | Status: DC | PRN

## 2023-01-09 MED ORDER — MORPHINE SULFATE (PF) 2 MG/ML IV SOLN
2.0000 mg | INTRAVENOUS | Status: DC | PRN
  Administered 2023-01-09: 2 mg via INTRAVENOUS
  Administered 2023-01-12: 4 mg via INTRAVENOUS
  Filled 2023-01-09: qty 2
  Filled 2023-01-09: qty 1

## 2023-01-09 NOTE — Progress Notes (Signed)
Pharmacy Antibiotic Note  Albert Perez is a 72 y.o. male admitted on 01/04/2023 with  empyma .  Pharmacy has been consulted for vancomycin and Zosyn dosing.  Plan: Zosyn 3.375 gr IV q8h EI  Vancomycin 1250 mg IV q24h ( AUC 509, SCr rounded up 0.8 mg/dl, wt 40.9 kg)  Daily SCr to monitor renal function while on vancomycin and Zosyn combination.   Height: 5\' 7"  (170.2 cm) Weight: 56.8 kg (125 lb 3.2 oz) IBW/kg (Calculated) : 66.1  Temp (24hrs), Avg:99.1 F (37.3 C), Min:98 F (36.7 C), Max:99.7 F (37.6 C)  Recent Labs  Lab 01/04/23 1431 01/04/23 2109 01/05/23 0739 01/06/23 0444 01/07/23 0458 01/08/23 0456 01/09/23 0533  WBC 10.7*  --  11.0* 10.0 9.5 9.9 10.6*  CREATININE 0.80  --  0.71 0.65 0.60* 0.47* 0.54*  LATICACIDVEN 1.2 2.4* 0.9  --   --   --   --     Estimated Creatinine Clearance: 67.1 mL/min (A) (by C-G formula based on SCr of 0.54 mg/dL (L)).    No Known Allergies  Antimicrobials this admission: 7/7 vancomycin >>  7/7 Zosyn >>   Dose adjustments this admission:   Microbiology results: 7/2 BCx: 1/3 Staph hominis 7/2 BCx: NGTD  7/2 resp panel: negative   Thank you for allowing pharmacy to be a part of this patient's care.  Adalberto Cole, PharmD, BCPS 01/09/2023 7:38 AM

## 2023-01-09 NOTE — Progress Notes (Signed)
Pt with 10 beats of VT. Pt asymptomatic, VS stable for now. Palliative and wife at bedside. MD informed. Will keep monitoring.

## 2023-01-09 NOTE — Progress Notes (Signed)
PROGRESS NOTE    Albert Perez  ZOX:[id number]  DOB: January 01, 1951  DOA: 01/04/2023 PCP: Henrine Screws, MD Outpatient Specialists:   Hospital course:  72 y.o. male with medical history significant of atrial fibrillation on apixaban, chronic pain secondary to stage IV NSCLC with involvement of ribs, pelvis and left humerus with pathologic fracture s/p palliative radiotherapy, medication induced diarrhea, DVT, EtOH abuse, GERD, OSA not on CPAP, and vertigo who presented to the emergency department with hypotension, decreased appetite and multiple wounds that are oozing blood, particularly his right upper back.     Subjective:  Pt reports NAOE. Still report significant pain. Denies any SOB, fevers, chills.   Objective: Vitals:   01/08/23 1400 01/08/23 2005 01/09/23 0540 01/09/23 0919  BP: (!) 133/59 94/64 (!) 95/53 (!) 107/58  Pulse: 66 81 80 94  Resp:  16 15 20   Temp: 98 F (36.7 C) 99.7 F (37.6 C) 99.5 F (37.5 C) 99.7 F (37.6 C)  TempSrc: Axillary Oral Oral Oral  SpO2: 97% 92% 92% 98%  Weight:      Height:        Intake/Output Summary (Last 24 hours) at 01/09/2023 1209 Last data filed at 01/09/2023 8119 Gross per 24 hour  Intake 240 ml  Output 2250 ml  Net -2010 ml   Filed Weights   01/04/23 1356  Weight: 56.8 kg     Exam:  General: Chronically ill-appearing man lying in [place] bed Eyes: sclera anicteric, conjuctiva mild injection bilaterally CVS: RRR Respiratory:  decreased air entry bilaterally secondary to decreased inspiratory effort, rales at bases  GI: NABS, soft, NT   Data Reviewed:  Basic Metabolic Panel: Recent Labs  Lab 01/05/23 0739 01/06/23 0444 01/07/23 0458 01/08/23 0456 01/09/23 0533  NA 134* 134* 135 134* 132*  K 3.9 3.8 4.0 4.0 4.0  CL 103 102 105 105 103  CO2 22 25 24 24 25   GLUCOSE 103* 116* 120* 120* 104*  BUN 21 19 17 13 13   CREATININE 0.71 0.65 0.60* 0.47* 0.54*  CALCIUM 7.5* 7.7* 7.2* 7.4* 7.6*  MG  --   --   --   --   1.7    CBC: Recent Labs  Lab 01/04/23 1431 01/05/23 0739 01/05/23 2133 01/06/23 0444 01/07/23 0458 01/08/23 0456 01/09/23 0533  WBC 10.7* 11.0*  --  10.0 9.5 9.9 10.6*  NEUTROABS 8.8*  --   --   --  7.4  --   --   HGB 5.4* 7.8* 8.5* 8.2* 7.5* 7.7* 8.0*  HCT 17.8* 24.3* 26.7* 25.3* 23.7* 25.3* 26.3*  MCV 97.3 92.4  --  93.0 94.0 96.9 95.3  PLT 354 316  --  322 298 285 294     Scheduled Meds:  bacitracin  1 Application Topical BID   feeding supplement  237 mL Oral BID BM   leptospermum manuka honey  1 Application Topical Daily   morphine   Intravenous Q4H   osimertinib mesylate  80 mg Oral Daily   oxyCODONE-acetaminophen  1 tablet Oral QID   senna  1 tablet Oral Daily   tepotinib hcl  225 mg Oral QODAY   Continuous Infusions:  piperacillin-tazobactam (ZOSYN)  IV 3.375 g (01/09/23 0937)   vancomycin 1,250 mg (01/09/23 1105)     Assessment & Plan:    Acute on chronic pain Stage IV NSCLC Moderate right loculated pleural effusion Pain poorly controlled. Percocet 7.5 every 6 hours standing with 5 mg oxycodone as needed for breakthrough pain has been  effective.Patient may well benefit from long-acting narcotics as an outpatient, being followed actively by palliative care. MRI and CT ordered by Dr. 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 Additionally, there is a new moderate right loculated pleural effusion with c/f empyema and bronchopleural fistula. Per Dr. Shirline Frees note, due to transportation difficulties wants to complete staging work up during this hospitalization, he will be available again on Monday to discuss results. Discussed results of new right sided effusion with oncology and determine need for additional work up. He is on medications that will suppress his immune system and may represent infectious etiology, d/w patient that likely needs at least diagnostic  thoracentesis though he wants to wait until he can discuss with Dr. Shirline Frees to do any type of invasive procedure. We discussed the risks of on going effusion and possible infection without source control but pt wishes to wait until he can walk with Dr. Shirline Frees. Have notified PCCM and is aware of pt for possible thora on 7/8. We will empirically cover with vac/zosyn. I am concerned due to poor nutritional and functional status about his overall prognosis, have involved palliative care to help with symptom management and GOC.  -hold home osimertinib, tepotinib in setting of possible infection -start vanc, zosyn for empyema  -Oncology over the weekend notified and per note of Dr. Shirline Frees will see Monday -PCCM aware of c/f empyema, will formally consult if pt and Dr. Shirline Frees agreeable to thoracentesis  -palliative care consulted for symptom mgmt and GOC; started PCA pump  Symptomatic anemia, stable Oozing Pressure Wound on Back H&H on admission was 5.4 received 2 units with appropriate response.  He has not required any additional transfusions this hospitalization.  Etiology of his anemia is due to various pressure wounds particularly on his back.  He is hemodynamically stable.  His apixaban was held on admission, risks and benefits of this medication were discussed.  His hemoglobin is stable for several days at this point, primary reason for continued hospitalization is for work up of cancer as below.  -daily CBC, transfuse for < 7 -Wound nurse continues to follow which we appreciate -holding DOAC as below  Dysphagia Appreciate evaluation by speech therapy. Continue thin liquids as per recommendations with pured foods with medications  Hyponatremia Suspect hypovolemic, trial 1 L fluids today.  -repeat BMP 78  Atrial fibrillation Hx of pulmonary emboli Patient had been on apixaban for CHA2DS2-VASc 2 score of 5 and history of pulmonary emboli. However given continuous oozing of wound on back  decreased hemoglobin, has been held -holding eliquis  HTN Blood pressures in the 90s 7/4, was gentle hydration given decreased p.o. intake, stopped 7/6.  Carvedilol is being held given low blood pressure. Patient is apparently also written for Lasix and torsemide however unclear if he is taking, these are being held. -holding coreg, lasix/torsemide  DVT prophylaxis: SCD Bowels: None recorded, started senna 7/6 Code Status: DNR Family Communication: None at bedside   Studies: No results found.  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

## 2023-01-09 NOTE — Progress Notes (Signed)
Pt refuses CPAP 

## 2023-01-09 NOTE — Consult Note (Signed)
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  Palliative medicine team consulted to assist with complex medical decision making and symptom management. Of note patient seen by outpatient palliative medicine team at [facility].  Extensive review of EMR prior to presenting to bedside.  At time of EMR review patient has been receiving Percocet 7.5 mg 4 times daily scheduled in addition to oxycodone 5 mg every 4 hours as needed x 4 doses.  Presented to bedside in AM.  Bedside RN present to discuss care.  Patient laying in bed and is grimacing and moaning in pain.  Able to introduce myself and the role of the palliative medicine team in patient's medical journey.  Spent time reviewing patient's pain which is primarily in his hips due to bony metastatic disease.  Able to discuss patient's medical journey up into this point.  With permission, able to discuss concern about patient's continued deterioration in medical status now with multiple wounds, possible empyema, and lack of oral intake with albumin less than 1.5.  Patient acknowledged all of this.   With patient's permission, able to discuss pathways for medical care moving forward.  Patient notes that if time is growing short, he would want to be at home with his family as time with them is most important to him.  Discussed the idea of home hospice support to get patient home for symptom management at the end of life.  Patient is agreeing with this plan though would like his wife to be informed of this discussion.  Noted would follow-up later in day once wife present.  All questions answered at that time.  Noted work on patient's pain management at this time.  Presented to bedside later in day once informed patient's wife was present.  When presenting, able to introduce myself and the role of the palliative medicine team in patient's care.  Again reviewed what had previously discussed with patient regarding his continued deterioration of medical status.  Discussed worry that patient's time is growing short due to his underlying cancer now with signs of infection or fistula.  With this discussed risk and benefits of chest tube and patient and wife can voice they do not want to proceed with chest tube placement as no would cause patient more pain.  Also discussed worry about patient's lack of oral intake.  Wife acknowledged this.  Given with permission able to discuss possibility of going home with hospice which wife is supportive of.  Discussed patient can even continue oral antibiotics at home with hospice to allow for more enjoyable time with family.  Wife agreeing with this plan.  Both patient and wife wanting to speak to Dr. Arbutus Ped tomorrow though  as he is the primary colleges.  Acknowledged and supported this conversation.  Discussed today starting patient on PCA to assist assist with pain management since patient is currently in agony.  Patient and wife agreeing with this.  Noted we will continue to focus on pain management at this time and await further discussions with Dr. Shirline Frees before getting TOC  involved for getting home with hospice.  Patient and wife agreeing with this plan.  All questions answered at that time.  Provided emotional support via active listening.  Thanked patient and wife for allowing me to visit with him today.  Updated IDT after visit.  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 Antony Blackbird   History of radiation therapy    Right Pelvis-05/06/22-05/19/22- Dr. Antony Blackbird   Lung cancer [facility]) dx'd 02/26/2019   Metastatic cancer to bone (HCC) dx'd 02/26/2019   Sleep apnea    do not wear CPAP   Stroke (HCC) 2019   " mini"   Vertigo    Wears glasses    Social History   Socioeconomic History   Marital status: Married    Spouse name: Not on file   Number of children: 2   Years of education: 14   Highest education level: Some college, no degree  Occupational History   Not on file  Tobacco Use   Smoking status: Every Day    Packs/day: 0.25    Years: 30.00    Additional pack years: 0.00    Total pack years: 7.50    Types: Cigarettes   Smokeless tobacco: Never   Tobacco comments:    04/27/21: Per pt, smokes 3 cigs a day now.  Vaping Use   Vaping Use: Former  Substance and Sexual Activity   Alcohol use: Yes    Alcohol/week: 2.0 standard  drinks of alcohol    Types: 2 Standard drinks or equivalent per week   Drug use: Yes    Types: Marijuana    Comment: ocassionally- last time was 11/07/20   Sexual activity: Not on file  Other Topics Concern   Not on file  Social History Narrative   Lives at home with wife, daughter and grandson   Right handed   Caffeine: sodas 2 per day   Social Determinants of Health   Financial Resource Strain: Not on file  Food Insecurity: No Food Insecurity (01/04/2023)   Hunger Vital Sign    Worried About Running Out of Food in the Last Year: Never true    Ran Out of Food in the Last Year: Never true  Transportation Needs: No Transportation Needs (01/05/2023)   PRAPARE - Administrator, Civil Service (Medical): No  Lack of Transportation (Non-Medical): No  Recent Concern: Transportation Needs - Unmet Transportation Needs (01/04/2023)   PRAPARE - Administrator, Civil Service (Medical): Yes    Lack of Transportation (Non-Medical): Yes  Physical Activity: Not on file  Stress: Not on file  Social Connections: Not on file   Family History  Problem Relation Age of Onset   Aneurysm Mother    Colon cancer Brother    Prostate cancer Brother    Scheduled Meds:  bacitracin  1 Application Topical BID   cyanocobalamin  1,000 mcg Oral Daily   feeding supplement  237 mL Oral BID BM   leptospermum manuka honey  1 Application Topical Daily   osimertinib mesylate  80 mg Oral Daily   oxyCODONE-acetaminophen  1 tablet Oral QID   rosuvastatin  5 mg Oral Daily   senna  1 tablet Oral Daily   tepotinib hcl  225 mg Oral QODAY   Continuous Infusions:  lactated ringers     piperacillin-tazobactam (ZOSYN)  IV     vancomycin     PRN Meds:.acetaminophen **OR** acetaminophen, ondansetron **OR** ondansetron (ZOFRAN) IV, oxyCODONE No Known Allergies CBC:    Component Value Date/Time   WBC 10.6 (H) 01/09/2023 0533   HGB 8.0 (L) 01/09/2023 0533   HGB 10.4 (L) 10/26/2022 1226   HCT  26.3 (L) 01/09/2023 0533   PLT 294 01/09/2023 0533   PLT 371 10/26/2022 1226   MCV 95.3 01/09/2023 0533   NEUTROABS 7.4 01/07/2023 0458   LYMPHSABS 1.1 01/07/2023 0458   MONOABS 0.9 01/07/2023 0458   EOSABS 0.0 01/07/2023 0458   BASOSABS 0.0 01/07/2023 0458   Comprehensive Metabolic Panel:    Component Value Date/Time   NA 132 (L) 01/09/2023 0533   NA 138 06/10/2022 1006   K 4.0 01/09/2023 0533   CL 103 01/09/2023 0533   CO2 25 01/09/2023 0533   BUN 13 01/09/2023 0533   BUN 17 06/10/2022 1006   CREATININE 0.54 (L) 01/09/2023 0533   CREATININE 0.95 10/26/2022 1226   GLUCOSE 104 (H) 01/09/2023 0533   CALCIUM 7.6 (L) 01/09/2023 0533   AST 24 01/09/2023 0533   AST 11 (L) 10/26/2022 1226   ALT 19 01/09/2023 0533   ALT 9 10/26/2022 1226   ALKPHOS 117 01/09/2023 0533   BILITOT 0.6 01/09/2023 0533   BILITOT 0.2 (L) 10/26/2022 1226   PROT 4.5 (L) 01/09/2023 0533   ALBUMIN <1.5 (L) 01/09/2023 0533    Physical Exam: Vital Signs: BP (!) 95/53 (BP Location: Right Arm)   Pulse 80   Temp 99.5 F (37.5 C) (Oral)   Resp 15   Ht 5\' 7"  (1.702 m)   Wt 56.8 kg   SpO2 92%   BMI 19.61 kg/m  SpO2: SpO2: 92 % O2 Device: O2 Device: Room Air O2 Flow Rate:   Intake/output summary:  Intake/Output Summary (Last 24 hours) at 01/09/2023 0854 Last data filed at 01/09/2023 0630 Gross per 24 hour  Intake 120 ml  Output 2000 ml  Net -1880 ml   LBM: Last BM Date : 01/08/23 Baseline Weight: Weight: 56.8 kg Most recent weight: Weight: 56.8 kg  General: NAD, alert, grimacing, frail, cachectic  Eyes: No drainage noted HENT: Dry mucous membranes Cardiovascular: RRR Respiratory: increased work of breathing noted, not in respiratory distress Abdomen: not distended Extremities: Muscle wasting present in all extremities Skin: Multiple ecchymoses on upper extremities bilaterally; wound reviewed in EMR pictures Neuro: A&Ox4, following commands easily Psych: appropriately answers all questions  Palliative Performance Scale: 20%              Additional Data Reviewed: Recent Labs    01/08/23 0456 01/09/23 0533  WBC 9.9 10.6*  HGB 7.7* 8.0*  PLT 285 294  NA 134* 132*  BUN 13 13  CREATININE 0.47* 0.54*    Imaging: CT ABDOMEN PELVIS W CONTRAST CLINICAL DATA:  Non-small cell lung cancer staging; * Tracking Code: BO *  EXAM: CT CHEST, ABDOMEN, AND PELVIS WITH CONTRAST  TECHNIQUE: Multidetector CT imaging of the chest, abdomen and pelvis was performed following the standard protocol during bolus administration of intravenous contrast.  RADIATION DOSE REDUCTION: This exam was performed according to the departmental dose-optimization program which includes automated exposure control, adjustment of the mA and/or kV according to patient size and/or use of iterative reconstruction technique.  CONTRAST:  OMNIPAQUE IOHEXOL 300 MG/ML  SOLN  COMPARISON:  CT chest, abdomen and dated October 26, 2022  FINDINGS: CT CHEST FINDINGS  Cardiovascular: Normal heart size. Small pericardial effusion. Normal caliber thoracic aorta with moderate atherosclerotic disease. Severe coronary artery calcifications.  Mediastinum/Nodes: Esophagus and thyroid are unremarkable. Interval increased size mediastinal lymph nodes. Reference subcarinal lymph node measuring 12 mm in short axis on series 2, image 33, previously 5 mm. Reference pre-vascular lymph node measuring 8 mm in short axis, previously 5 mm.  Lungs/Pleura: Central airways are patent. Small amount of tracheal debris. New moderate loculated pleural fluid collection with pleural thickening and gas. Complete collapse of the right lower lobe, likely secondary to external compression and partial collapse of the right middle lobe. Small left pleural effusion is increased in size when compared with the prior exam and demonstrates mild pleural thickening. Spiculated nodule of the superior left lower lobe is unchanged in size,  measuring 2.9 x 1.8 cm. Increased linear opacities of the left lower lobe and peripheral left upper lobe, likely due to atelectasis.  Musculoskeletal: Stable comminuted pathologic fracture of the proximal left humerus. Unchanged lytic and sclerotic osseous lesions of the thoracic spine.  CT ABDOMEN PELVIS FINDINGS  Hepatobiliary: No focal liver abnormality is seen. No gallstones, gallbladder wall thickening, or biliary dilatation.  Pancreas: Unremarkable. No pancreatic ductal dilatation or surrounding inflammatory changes.  Spleen: Normal in size without focal abnormality.  Adrenals/Urinary Tract: Bilateral adrenal glands are unremarkable. Hydronephrosis or nephrolithiasis. Low-attenuation lesion of the upper pole the right kidney, consistent with a simple cyst. Additional bilateral low-attenuation renal lesions which are seen which are too small to accurately characterize but likely simple cysts, specific follow-up imaging is necessary for these renal lesions. Mild bladder wall thickening, decreased when compared with the prior exam.  Stomach/Bowel: Stomach is within normal limits. No evidence of bowel wall thickening, distention, or inflammatory changes.  Vascular/Lymphatic: Aortic atherosclerosis. No enlarged abdominal or pelvic lymph nodes.  Reproductive: Unremarkable.  Other: No abdominal wall hernia or abnormality. No abdominopelvic ascites.  Musculoskeletal: Unchanged lytic and sclerotic lesions of the pelvis and lumbar spine including large destructive osseous lesions of the right pelvis.  IMPRESSION: 1. New moderate loculated right pleural fluid collection with associated pleural thickening and gas, concerning for empyema. 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 2. Stable spiculated solid nodule of the superior left lower lobe. 3. Small left pleural effusion is increased in size when compared with the prior exam and demonstrates  mild pleural thickening. 4. Slight increased size of mediastinal lymph nodes, possibly reactive, although progressive metastatic disease can not be excluded. 5. Unchanged lytic and sclerotic  osseous lesions. 6. Mild bladder wall thickening, decreased when compared with the prior exam and likely due to resolving cystitis.  These results will be called to the ordering clinician or representative by the Radiologist Assistant, and communication documented in the PACS or [employer].  Electronically Signed   By: Allegra Lai M.D.   On: 01/06/2023 13:27 CT CHEST W CONTRAST CLINICAL DATA:  Non-small cell lung cancer staging; * Tracking Code: BO *  EXAM: CT CHEST, ABDOMEN, AND PELVIS WITH CONTRAST  TECHNIQUE: Multidetector CT imaging of the chest, abdomen and pelvis was performed following the standard protocol during bolus administration of intravenous contrast.  RADIATION DOSE REDUCTION: This exam was performed according to the departmental dose-optimization program which includes automated exposure control, adjustment of the mA and/or kV according to patient size and/or use of iterative reconstruction technique.  CONTRAST:  OMNIPAQUE IOHEXOL 300 MG/ML  SOLN  COMPARISON:  CT chest, abdomen and dated October 26, 2022  FINDINGS: CT CHEST FINDINGS  Cardiovascular: Normal heart size. Small pericardial effusion. Normal caliber thoracic aorta with moderate atherosclerotic disease. Severe coronary artery calcifications.  Mediastinum/Nodes: Esophagus and thyroid are unremarkable. Interval increased size mediastinal lymph nodes. Reference subcarinal lymph node measuring 12 mm in short axis on series 2, image 33, previously 5 mm. Reference pre-vascular lymph node measuring 8 mm in short axis, previously 5 mm.  Lungs/Pleura: Central airways are patent. Small amount of tracheal debris. New moderate loculated pleural fluid collection with pleural thickening and gas.  Complete collapse of the right lower lobe, likely secondary to external compression and partial collapse of the right middle lobe. Small left pleural effusion is increased in size when compared with the prior exam and demonstrates mild pleural thickening. Spiculated nodule of the superior left lower lobe is unchanged in size, measuring 2.9 x 1.8 cm. Increased linear opacities of the left lower lobe and peripheral left upper lobe, likely due to atelectasis.  Musculoskeletal: Stable comminuted pathologic fracture of the proximal left humerus. Unchanged lytic and sclerotic osseous lesions of the thoracic spine.  CT ABDOMEN PELVIS FINDINGS  Hepatobiliary: No focal liver abnormality is seen. No gallstones, gallbladder wall thickening, or biliary dilatation.  Pancreas: Unremarkable. No pancreatic ductal dilatation or surrounding inflammatory changes.  Spleen: Normal in size without focal abnormality.  Adrenals/Urinary Tract: Bilateral adrenal glands are unremarkable. Hydronephrosis or nephrolithiasis. Low-attenuation lesion of the upper pole the right kidney, consistent with a simple cyst. Additional bilateral low-attenuation renal lesions which are seen which are too small to accurately characterize but likely simple cysts, specific follow-up imaging is necessary for these renal lesions. Mild bladder wall thickening, decreased when compared with the prior exam.  Stomach/Bowel: Stomach is within normal limits. No evidence of bowel wall thickening, distention, or inflammatory changes.  Vascular/Lymphatic: Aortic atherosclerosis. No enlarged abdominal or pelvic lymph nodes.  Reproductive: Unremarkable.  Other: No abdominal wall hernia or abnormality. No abdominopelvic ascites.  Musculoskeletal: Unchanged lytic and sclerotic lesions of the pelvis and lumbar spine including large destructive osseous lesions of the right pelvis.  IMPRESSION: 1. New moderate loculated right  pleural fluid collection with associated pleural thickening and gas, concerning for empyema. 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 2. Stable spiculated solid nodule of the superior left lower lobe. 3. Small left pleural effusion is increased in size when compared with the prior exam and demonstrates mild pleural thickening. 4. Slight increased size of mediastinal lymph nodes, possibly reactive, although progressive metastatic disease can not be excluded. 5. Unchanged lytic  and sclerotic osseous lesions. 6. Mild bladder wall thickening, decreased when compared with the prior exam and likely due to resolving cystitis.  These results will be called to the ordering clinician or representative by the Radiologist Assistant, and communication documented in the PACS or [employer].  Electronically Signed   By: Allegra Lai M.D.   On: 01/06/2023 13:27 MR HIP RIGHT W WO CONTRAST CLINICAL DATA:  Chronic right hip pain.  History of lung cancer.  EXAM: MRI OF THE RIGHT HIP WITHOUT AND WITH CONTRAST  TECHNIQUE: Multiplanar, multisequence MR imaging was performed both before and after administration of intravenous contrast.  CONTRAST:  6mL GADAVIST GADOBUTROL 1 MMOL/ML IV SOLN  COMPARISON:  10/26/2022 CT abdomen/pelvis, PET scan 07/19/2022  FINDINGS: Bones:  No hip fracture or dislocation. 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  Extensive bone lesion involving the right ilium and ischium with bone destruction of the right ilium and involving the entirety of the acetabulum at high risk for a pathologic fracture. 2.4 cm bone lesion in the left superior ilium.  Normal sacrum and sacroiliac joints. No SI joint widening or erosive changes.  Articular cartilage and labrum  Articular cartilage: Partial-thickness cartilage loss of the femoral head and acetabulum bilaterally.  Labrum: Grossly intact, but evaluation is limited  by lack of intraarticular fluid.  Joint or bursal effusion  Joint effusion:  No hip joint effusion.  No SI joint effusion.  Bursae:  No bursal fluid.  Muscles and tendons  Edema throughout the gluteal muscles bilaterally, right worse than left. Edema throughout the right adductor musculature and left operator externus muscle.  Other findings  No pelvic free fluid. No fluid collection or hematoma. No inguinal lymphadenopathy. No inguinal hernia. Generalized anasarca.  IMPRESSION: 1. Extensive metastatic disease involving the right ilium and ischium with bone destruction of the right ilium and involving the entirety of the acetabulum at high risk for a pathologic fracture. Metastatic bone lesion in the left superior ilium and right femoral head. 2. Edema throughout the gluteal muscles bilaterally, right worse than left. Edema throughout the right adductor musculature and left operator externus muscle. Differential considerations include infectious or inflammatory myositis versus denervation edema. 3. Mild osteoarthritis of bilateral hips. 4. 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 5. Anasarca.  Electronically Signed   By: Elige Ko M.D.   On: 01/06/2023 11:34    I personally reviewed recent imaging.   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  Patient also found to imaging to have possible empyema versus  bronchopleural fistula; receiving antibiotics from management.  Palliative medicine team consulted to assist with complex medical decision making and symptom management. Of note patient seen by outpatient palliative medicine team at [facility].  # Complex medical decision making/goals of care  -Extensive discussion with patient initially and then with patient and wife at bedside.  Discussed pathways for medical care moving forward.  Discussed concern about patient's time being short based on his progressive cancer, worsening  overall medical status, bedbound status, and lack of oral intake.  Discussed possibility of going home with hospice support to focus on symptom management at the end of life.  Patient and wife agreeing with this plan though would like to speak to Dr. Arbutus Ped tomorrow when he is back on service.  Acknowledged and respected patient's wishes regarding this.  Discussed we will continue with antibiotics at this time and could even go home on oral antibiotics with hospice.  Would not proceed with chest tube placement as this will cause patient more pain.  Will start patient on more aggressive symptom management knowing that pain management is the priority.  -  Code Status: DNR  Prognosis: weeks  # Symptom management  -Pain/dyspnea, in the setting of metastatic NSCLC   -Discontinue oral opioids as no longer managing patient's pain management at this time.    -Start IV morphine PCA with 3mg  bolus q 10 minus. Will not place continuous infusion at this time though will likely need long acting moving forward. Continue to adjust based on patient's symptom burden.   # Psycho-social/Spiritual Support:  - Support System: wife  # Discharge Planning:  To Be Determined  Thank you for allowing the palliative care team to participate in the care Ivie L Iyengar.  [name], DO Palliative Care Provider PMT # [phone]  If patient remains symptomatic despite maximum doses, please call PMT at  [phone] between 0700 and 1900. Outside of these hours, please call attending, as PMT does not have night coverage.  *Please note that this is a verbal dictation therefore any spelling or grammatical errors are due to the "[employer]" system interpretation.

## 2023-01-10 ENCOUNTER — Other Ambulatory Visit: Payer: Self-pay

## 2023-01-10 DIAGNOSIS — Z66 Do not resuscitate: Secondary | ICD-10-CM

## 2023-01-10 DIAGNOSIS — D649 Anemia, unspecified: Secondary | ICD-10-CM | POA: Diagnosis not present

## 2023-01-10 DIAGNOSIS — G893 Neoplasm related pain (acute) (chronic): Secondary | ICD-10-CM | POA: Diagnosis not present

## 2023-01-10 DIAGNOSIS — C3492 Malignant neoplasm of unspecified part of left bronchus or lung: Secondary | ICD-10-CM | POA: Diagnosis not present

## 2023-01-10 DIAGNOSIS — R627 Adult failure to thrive: Secondary | ICD-10-CM | POA: Diagnosis not present

## 2023-01-10 DIAGNOSIS — Z515 Encounter for palliative care: Secondary | ICD-10-CM | POA: Diagnosis not present

## 2023-01-10 LAB — COMPREHENSIVE METABOLIC PANEL
ALT: 17 U/L (ref 0–44)
AST: 19 U/L (ref 15–41)
Albumin: <1.5 g/dL — ABNORMAL LOW (ref 3.5–5.0)
Alkaline Phosphatase: 120 U/L (ref 38–126)
Anion gap: 6 (ref 5–15)
BUN: 13 mg/dL (ref 8–23)
CO2: 25 mmol/L (ref 22–32)
Calcium: 7.6 mg/dL — ABNORMAL LOW (ref 8.9–10.3)
Chloride: 102 mmol/L (ref 98–111)
Creatinine, Ser: 0.51 mg/dL — ABNORMAL LOW (ref 0.61–1.24)
GFR, Estimated: >60 mL/min (ref >60)
Glucose, Bld: 116 mg/dL — ABNORMAL HIGH (ref 70–99)
Potassium: 4 mmol/L (ref 3.5–5.1)
Sodium: 133 mmol/L — ABNORMAL LOW (ref 135–145)
Total Bilirubin: 0.5 mg/dL (ref 0.3–1.2)
Total Protein: 4.5 g/dL — ABNORMAL LOW (ref 6.5–8.1)

## 2023-01-10 LAB — CBC
HCT: 24.4 % — ABNORMAL LOW (ref 39.0–52.0)
Hemoglobin: 7.6 g/dL — ABNORMAL LOW (ref 13.0–17.0)
MCH: 29.9 pg (ref 26.0–34.0)
MCHC: 31.1 g/dL (ref 30.0–36.0)
MCV: 96.1 fL (ref 80.0–100.0)
Platelets: 320 10*3/uL (ref 150–400)
RBC: 2.54 MIL/uL — ABNORMAL LOW (ref 4.22–5.81)
RDW: 16 % — ABNORMAL HIGH (ref 11.5–15.5)
WBC: 11.6 10*3/uL — ABNORMAL HIGH (ref 4.0–10.5)
nRBC: 0 % (ref 0.0–0.2)

## 2023-01-10 LAB — MAGNESIUM: Magnesium: 1.7 mg/dL (ref 1.7–2.4)

## 2023-01-10 MED ORDER — MORPHINE SULFATE 1 MG/ML IV SOLN PCA
INTRAVENOUS | Status: DC
  Administered 2023-01-10: 29.92 mg via INTRAVENOUS
  Administered 2023-01-10: 4.46 mg via INTRAVENOUS
  Filled 2023-01-10 (×3): qty 30

## 2023-01-10 MED ORDER — MORPHINE SULFATE 1 MG/ML IV SOLN PCA
INTRAVENOUS | Status: DC
  Administered 2023-01-10: 19.86 mg via INTRAVENOUS
  Administered 2023-01-10: 10 mg via INTRAVENOUS
  Administered 2023-01-11: 27.27 mg via INTRAVENOUS
  Administered 2023-01-11: 23.59 mg via INTRAVENOUS
  Administered 2023-01-11: 4.77 mL via INTRAVENOUS
  Administered 2023-01-11: 5.9 mg via INTRAVENOUS
  Administered 2023-01-11: 29.81 mg via INTRAVENOUS
  Administered 2023-01-12: 29.93 mg via INTRAVENOUS
  Filled 2023-01-10 (×7): qty 30

## 2023-01-10 NOTE — Progress Notes (Signed)
Civil engineer, contracting [facility]) Hospital Liaison Note  Referral received for patient/family interest in hospice at home. [facility] liaison spoke with patient's wife [name] and son to confirm interest. Interest confirmed.   Plan is to discharge home once pain has been managed.   DME ordered: fully electric hospital bed, over the bed table, and oxygen.   Plan is to have DME delivered tomorrow. Please inform [facility] of discharge once it is determined so that we are able to arrange outpatient PCA and get same day AV scheduled.   Please send comfort medications/prescriptions home with patient at discharge.   Please call with any questions or concerns. Thank you  Dionicio Stall, [facility] Hospital Liaison [phone]

## 2023-01-10 NOTE — TOC Progression Note (Signed)
Transition of Care [facility]) - Progression Note    Patient Details  Name: Albert Perez MRN: [id number] Date of Birth: 06/27/1951  Transition of Care [facility]) CM/SW Contact  Howell Rucks, RN Phone Number: 01/10/2023, 11:14 AM  Clinical Narrative:   [facility] consult for Home with [facility]. [facility] is currently following for Hospice. TOC will continue to follow..       Barriers to Discharge: Continued Medical Work up  Expected Discharge Plan and Services                                               Social Determinants of Health (SDOH) Interventions SDOH Screenings   Food Insecurity: No Food Insecurity (01/04/2023)  Housing: Low Risk  (01/04/2023)  Transportation Needs: No Transportation Needs (01/05/2023)  Recent Concern: Transportation Needs - Unmet Transportation Needs (01/04/2023)  Utilities: Not At Risk (01/04/2023)  Tobacco Use: High Risk (01/06/2023)    Readmission Risk Interventions    01/05/2023   12:43 PM  Readmission Risk Prevention Plan  Transportation Screening Complete  PCP or Specialist Appt within 5-7 Days Complete  Home Care Screening Complete  Medication Review (RN CM) Complete

## 2023-01-10 NOTE — Progress Notes (Signed)
Subjective: The patient is seen and examined today.  Several family members were at the bedside including his wife, his son and a friend.  The patient is currently on PCA morphine pump for his pain management.  He had CT scan of the chest, abdomen pelvis as well as MRI of the hip.  The CT scan showed the new moderate loculated pleural effusion but no clear evidence for disease progression except for slight increase of mediastinal lymph node that could be reactive.  Definitely MRI of the right hip showed extensive metastatic disease involving the right ilium and ischium with bone destruction of the right ilium and involving the entirety of the acetabulum with high risk for pathologic fracture and metastatic bone lesion in the left superior ilium and right femoral head.  There was also edema throughout the gluteal muscles bilaterally right worse than left and edema throughout the right abductor musculature and left operator externus muscle.  Unfortunately there is no clear past for him for management of this destructive bone lesion in the hip area and the patient is immobile because of the bone metastasis.  He is feeling a little bit better today except for the fatigue and pain.  Objective: Vital signs in last 24 hours: Temp:  [98.1 F (36.7 C)-99.2 F (37.3 C)] 98.6 F (37 C) (07/08 1246) Pulse Rate:  [66-89] 70 (07/08 1246) Resp:  [14-20] 18 (07/08 1246) BP: (92-111)/(44-59) 99/54 (07/08 1246) SpO2:  [89 %-98 %] 90 % (07/08 1246) FiO2 (%):  [28 %] 28 % (07/07 1934)  Intake/Output from previous day: 07/07 0701 - 07/08 0700 In: 1182.9 [P.O.:767; I.V.:20.9; IV Piggyback:395] Out: 600 [Urine:600] Intake/Output this shift: Total I/O In: 60 [P.O.:60] Out: -   General appearance: alert, cooperative, fatigued, and mild distress Resp: diminished breath sounds RLL and dullness to percussion RLL Cardio: regular rate and rhythm, S1, S2 normal, no murmur, click, rub or gallop GI: soft, non-tender;  bowel sounds normal; no masses,  no organomegaly Extremities: edema 1+  Lab Results:  Recent Labs    01/09/23 0533 01/10/23 0406  WBC 10.6* 11.6*  HGB 8.0* 7.6*  HCT 26.3* 24.4*  PLT 294 320   BMET Recent Labs    01/09/23 0533 01/10/23 0406  NA 132* 133*  K 4.0 4.0  CL 103 102  CO2 25 25  GLUCOSE 104* 116*  BUN 13 13  CREATININE 0.54* 0.51*  CALCIUM 7.6* 7.6*    Studies/Results: [place] EKG SITE RITE  Result Date: 01/10/2023 If Site Rite image not attached, placement could not be confirmed due to current cardiac rhythm.   Medications: I have reviewed the patient's current medications.  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  The patient has been on treatment with started therapy with Tagrisso started April 21, 2019 and has been tolerating the treatment well.  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 daily but his family has been giving him only 225 every other day on their own because he was not able to tolerate the stander daily treatment.   Unfortunately the recent staging workup specially the MRI of the right hip showed extensive osseous metastasis.  The  patient is not a candidate for surgical intervention by orthopedic surgery.  Did not have a lot of disease progression systemically besides the bone metastasis but this is debilitating for the patient. I had a lengthy discussion with the patient and his family about his condition and I strongly recommended for him to continue with the palliative care and hospice at this point.  The patient and his family are in agreement with the current  plan.  Will discontinue his target treatment with Tagrisso and Tepotinib (Tepmetko) at this point.  They were very appreciative for the care they received over the last 4 years from me and my team at the cancer center.  I wished the patient and his family the best and I will continue to support him during this difficult time of end-of-life phase. Thank you so much for taking good care of Mr. Mickley, please call if you have any questions.  LOS: 5 days    Lajuana Matte 01/10/2023

## 2023-01-10 NOTE — Progress Notes (Signed)
Daily Progress Note   Patient Name: Albert Perez       Date: 01/10/2023 DOB: 1951-04-24  Age: 72 y.o. MRN#: [id number] Attending Physician: Lanae Boast, MD Primary Care Physician: Henrine Screws, MD Admit Date: 01/04/2023 Length of Stay: 5 days  Reason for Consultation/Follow-up: Establishing goals of care and symptom management  Subjective:   CC: Patient notes pain greatly improved.  Following up regarding complex medical decision making and symptom management.  Subjective:  EMR prior to presenting to bedside.  At time of EMR review patient has continued to receive IV morphine 3 mg every 10 minutes bolus x 14 doses over the past 24 hours.  Presented to bedside to meet with patient.  Able to meet with patient and his wife who was present.  Reviewed patient's symptom management at this time.  Patient does feel the IV morphine PCA has greatly improved his pain.  Patient still feels there is slight room for improvement so discussed increasing bolus dosing at this time.  Patient did not want to start continuous infusion yet though educated him this could be an option moving forward.   Discussed plan for going home with hospice on PCA.  [facility] or care hospice willing to support continued PCA use at home.  Patient and family agreeing with this plan.  Patient also agreeing with PICC placement for continuous IV access for PCA management at home.  All questions answered at that time.  Noted palliative medicine team will continue to follow along with patient's medical journey.  Thank patient and his wife for allowing me to visit today.  Updated IDT after visit with patient to coordinate care.  Review of Systems Pain improved Objective:   Vital Signs:  BP (!) 111/59 (BP Location: Left Arm)   Pulse 79   Temp 99 F (37.2 C) (Oral)   Resp 16   Ht 5\' 7"  (1.702 m)   Wt 56.8 kg   SpO2 91%   BMI 19.61 kg/m   Physical Exam: General: NAD, alert, pleasant, frail, cachectic  Eyes: No drainage  noted HENT: Dry mucous membranes Cardiovascular: RRR Respiratory: no increased work of breathing noted, not in respiratory distress Abdomen: not distended Extremities: Muscle wasting present in all extremities Skin: Multiple ecchymoses on upper extremities bilaterally; wound reviewed in EMR pictures Neuro: A&Ox4, following commands easily Psych: appropriately answers all questions  Imaging:  I personally reviewed recent imaging.   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  Palliative medicine team consulted to assist with complex medical decision making and symptom management. Of note patient seen by outpatient palliative medicine team at [facility].  Recommendations/Plan: # Complex medical decision making/goals of care:   -Patient transitioned to comfort care on 7/8 after meeting with patient and his wife. Patient and wife planning to go home with hospice support via [facility]. Will discontinue lab work. Continue abx for comfort and should be discharged on oral abx regimen to allow for comfortable time at home. Dr. Shirline Frees to visit  today.                 - Code Status: DNR  Prognosis: weeks   # Symptom management                -Pain/dyspnea, in the setting of metastatic NSCLC                               -Discontinue oral opioids as no longer managing patient's pain management at this time.                                 -Increase IV morphine PCA to 5mg  bolus q 10 minus.  Continue to adjust based on patient's symptom burden. Discussed with patient and will hold on starting continuous infusion at this time. Ordered for PICC line placement so can go home with PCA. [facility] hospice noted could support this.   # Psycho-social/Spiritual Support:  - Support System: wife  # Discharge Planning: Home with Hospice via [facility] once care coordinated (equipment delivered to house, PCA ordered, PICC in place).   Discussed with: patient, patient's wife, TOC, [facility] liaison, hospitalist, oncologist   Thank you for allowing the palliative care team to participate in the care Albert Perez.  [name], DO Palliative Care Provider PMT # [phone]  If patient remains symptomatic despite maximum doses, please call PMT at [phone] between 0700 and 1900. Outside of these hours, please call attending, as PMT does not have night coverage.  *Please note that this is a verbal dictation therefore any spelling or grammatical errors are due to the "[employer] One" system interpretation.

## 2023-01-10 NOTE — Progress Notes (Signed)
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 Patient was transfused 2 unit PRBC Eliquis stopped. Oncology following restaging scan ordered-showing large pleural effusion with C/F empyema Extensive meeting was held with patient palliative care as well as oncology Palliative care informed that patient wants to go home with PICC line PCA pump and start hospice services at home         Subjective: Seen and examined this morning wife at the bedside rested well with pain medication not in distress   Assessment and Plan: Principal Problem:   Symptomatic anemia Active Problems:   History of atrial fibrillation   Essential hypertension   Severe obstructive sleep apnea-hypopnea syndrome   Adenocarcinoma of left lung, stage 4 (HCC)   Tobacco abuse   Hyperlipidemia   Pulmonary emboli (HCC)   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 Shirline Frees  showed> extensive metastatic disease of right ilium and ischium with bony destruction and pathologic fracture; edema  b/l gluteal muscles.New moderate right loculated pleural effusion with c/f empyema and bronchopleural fistula> placed on empiric antibiotics. Per Dr. Shirline Frees note, due to transportation difficulties wants to complete staging work up during this hospitalization> awaiting further discussion with oncology regarding next plan of care Patient is immunocompromised-so it could be infectious pleural effusion versus malignant> however patient wanted discussed with his oncologist prior to any invasive diagnostic therapeutic procedure> meeting held 7/8, informed by palliative care that patient wants to go home, along with PCA pump    Symptomatic anemia-ABLA Oozing Pressure Wound on Back: ABLA likely from oozing of patient's wound on the back.  5.4 g on admission S/P units PRBC and hemoglobin improved.  His apixaban was held on admission, risks and benefits of this medication were discussed.  His hemoglobin is stable for several days at this point, primary reason for continued hospitalization is for work up of cancer as below.  -daily CBC, transfuse for < 7 Continue daily wound care   Dysphagia SLP has evaluated continue thin liquid pureed   Suspected hypovolemic hyponatremia Improved    Atrial fibrillation Hx of pulmonary emboli Patient had been on apixaban for CHA2DS2-VASc 2 score of 5 and history of pulmonary emboli, however given continuous oozing of wound on back decreased hemoglobin, has been held   HTN: Remains soft, holding Coreg, diuretics  Pressure injury sacrum left and right discharged to, left posterior right stage III POA see below Pressure Injury 01/04/23 Sacrum Left;Right Stage 2 -  Partial thickness loss of dermis presenting as a shallow open injury with a red, pink wound bed without slough. (Active)  01/04/23 2100  Location: Sacrum  Location Orientation: Left;Right  Staging: Stage 2 -  Partial thickness loss of dermis presenting as a shallow open injury with a red, pink wound bed  without slough.  Wound Description (Comments):   Present on Admission: Yes  Dressing Type Foam - Lift dressing to assess site every shift 01/09/23 0900     Pressure Injury 01/04/23 Leg Posterior;Right;Left Stage 3 -  Full thickness tissue loss. Subcutaneous fat may be visible but bone, tendon or muscle are NOT exposed. this is a full thickness wound, NOT a pressure injury (Active)  01/04/23 2100  Location: Leg  Location Orientation: Posterior;Right;Left  Staging: Stage 3 -  Full thickness tissue loss. Subcutaneous fat may be visible but bone, tendon or muscle are NOT exposed.  Wound Description (Comments): this is a full thickness wound, NOT a pressure injury  Present on Admission: Yes  Dressing Type ABD;Honey 01/09/23 0900   DVT prophylaxis:  Code Status:   Code Status: DNR Family Communication: plan of care discussed with patient/wife at bedside. Patient status is: Inpatient because of acute on chronic encephalopathy Level of care: Telemetry   Dispo: The patient is from: home            Anticipated disposition: home w/ hospice Objective: Vitals last 24 hrs: Vitals:   01/10/23 0043 01/10/23 0355 01/10/23 0408 01/10/23 0845  BP: (!) 96/54 (!) 111/59    Pulse: 85 79    Resp:  18 16 16   Temp:  99 F (37.2 C)    TempSrc:  Oral    SpO2:  (!) 89% 91% (!) 89%  Weight:      Height:       Weight change:   Physical Examination: General exam: alert awake, older than stated age HEENT:Oral mucosa moist, Ear/Nose WNL grossly Respiratory system: bilaterally clear BS, no use of accessory muscle Cardiovascular system: S1 & S2 +, No JVD. Gastrointestinal system: Abdomen soft,NT,ND, BS+ Nervous System:Alert, awake, moving extremities. Extremities: LE edema + le,distal peripheral pulses palpable.  Skin: No rashes,no icterus. MSK: Normal muscle bulk,tone, power  Medications reviewed:  Scheduled Meds:  bacitracin  1 Application Topical BID   feeding supplement  237 mL Oral BID BM    leptospermum manuka honey  1 Application Topical Daily   morphine   Intravenous Q4H   senna  1 tablet Oral Daily   Continuous Infusions:  piperacillin-tazobactam (ZOSYN)  IV 3.375 g (01/10/23 1049)   vancomycin 1,250 mg (01/10/23 1051)   Diet Order             Diet regular Room service appropriate? Yes; Fluid consistency: Thin  Diet effective now                   Intake/Output Summary (Last 24 hours) at 01/10/2023 1119 Last data filed at 01/10/2023 0600 Gross per 24 hour  Intake 1062.88 ml  Output 350 ml  Net 712.88 ml   Net IO Since Admission: 3,625.03 mL [01/10/23 1119]  Wt Readings from Last 3 Encounters:  01/04/23 56.8 kg  10/19/22 65.8 kg  08/04/22 74.4 kg     Unresulted Labs (From admission, onward)    None     Alert data Reviewed: I have personally reviewed following labs and imaging studies CBC: Recent Labs  Lab 01/04/23 1431 01/05/23 0739 01/06/23 0444 01/07/23 0458 01/08/23 0456 01/09/23 0533 01/10/23 0406  WBC 10.7*   < > 10.0 9.5 9.9 10.6* 11.6*  NEUTROABS 8.8*  --   --  7.4  --   --   --  HGB 5.4*   < > 8.2* 7.5* 7.7* 8.0* 7.6*  HCT 17.8*   < > 25.3* 23.7* 25.3* 26.3* 24.4*  MCV 97.3   < > 93.0 94.0 96.9 95.3 96.1  PLT 354   < > 322 298 285 294 320   < > = values in this interval not displayed.   Basic Metabolic Panel: Recent Labs  Lab 01/06/23 0444 01/07/23 0458 01/08/23 0456 01/09/23 0533 01/10/23 0406  NA 134* 135 134* 132* 133*  K 3.8 4.0 4.0 4.0 4.0  CL 102 105 105 103 102  CO2 25 24 24 25 25   GLUCOSE 116* 120* 120* 104* 116*  BUN 19 17 13 13 13   CREATININE 0.65 0.60* 0.47* 0.54* 0.51*  CALCIUM 7.7* 7.2* 7.4* 7.6* 7.6*  MG  --   --   --  1.7 1.7   Recent Labs  Lab 01/04/23 1431 01/05/23 0739 01/09/23 0533 01/10/23 0406  AST 12* 13* 24 19  ALT 14 13 19 17   ALKPHOS 132* 117 117 120  BILITOT 0.4 0.6 0.6 0.5  PROT 5.0* 4.6* 4.5* 4.5*  ALBUMIN <1.5* <1.5* <1.5* <1.5*   Recent Labs  Lab 01/04/23 1431  INR 2.4*    Recent Labs  Lab 01/04/23 1431 01/04/23 2109 01/05/23 0739  LATICACIDVEN 1.2 2.4* 0.9   Recent Results (from the past 240 hour(s))  Blood Culture (routine x 2)     Status: None   Collection Time: 01/04/23  2:31 PM   Specimen: BLOOD LEFT FOREARM  Result Value Ref Range Status   Specimen Description   Final    BLOOD LEFT FOREARM Performed at [facility], [address], [place], [place] [address]    Special Requests   Final    BOTTLES DRAWN AEROBIC AND ANAEROBIC Blood Culture results may not be optimal due to an excessive volume of blood received in culture bottles Performed at [facility], [address], [place], [place] [address]    Culture   Final    NO GROWTH 5 DAYS Performed at [facility], [address] [place], [place] [address]    Report Status 01/09/2023 FINAL  Final  Blood Culture (routine x 2)     Status: Abnormal   Collection Time: 01/04/23  2:38 PM   Specimen: BLOOD RIGHT WRIST  Result Value Ref Range Status   Specimen Description   Final    BLOOD RIGHT WRIST Performed at [facility], [address], [place], [place] [address]    Special Requests   Final    BOTTLES DRAWN AEROBIC ONLY Blood Culture results may not be optimal due to an inadequate volume of blood received in culture bottles Performed at [facility], [address] [place], [place] [address]    Culture  Setup Time   Final    GRAM POSITIVE COCCI IN CLUSTERS AEROBIC BOTTLE ONLY CRITICAL RESULT CALLED TO, READ BACK BY AND VERIFIED WITH: PHARMD MICHELLE LILLISTON ON 01/05/23 @ 1911 BY DRT    Culture (A)  Final    STAPHYLOCOCCUS HOMINIS THE SIGNIFICANCE OF ISOLATING THIS ORGANISM FROM A SINGLE SET OF BLOOD CULTURES WHEN MULTIPLE SETS ARE DRAWN IS UNCERTAIN. PLEASE NOTIFY THE MICROBIOLOGY DEPARTMENT WITHIN ONE WEEK IF SPECIATION AND SENSITIVITIES ARE REQUIRED. Performed at [facility], [address], [place], [place] [address]     Report Status 01/06/2023 FINAL  Final  Blood Culture ID Panel (Reflexed)     Status: Abnormal  Collection Time: 01/04/23  2:38 PM  Result Value Ref Range Status   Enterococcus faecalis NOT DETECTED NOT DETECTED Final   Enterococcus Faecium NOT DETECTED NOT DETECTED Final   Listeria monocytogenes NOT DETECTED NOT DETECTED Final   Staphylococcus species DETECTED (A) NOT DETECTED Final    Comment: CRITICAL RESULT CALLED TO, READ BACK BY AND VERIFIED WITH: PHARMD MICHELLE LILLISTON ON 01/05/23 @ 1911 BY DRT    Staphylococcus aureus (BCID) NOT DETECTED NOT DETECTED Final   Staphylococcus epidermidis NOT DETECTED NOT DETECTED Final   Staphylococcus lugdunensis NOT DETECTED NOT DETECTED Final   Streptococcus species NOT DETECTED NOT DETECTED Final   Streptococcus agalactiae NOT DETECTED NOT DETECTED Final   Streptococcus pneumoniae NOT DETECTED NOT DETECTED Final   Streptococcus pyogenes NOT DETECTED NOT DETECTED Final   A.calcoaceticus-baumannii NOT DETECTED NOT DETECTED Final   Bacteroides fragilis NOT DETECTED NOT DETECTED Final   Enterobacterales NOT DETECTED NOT DETECTED Final   Enterobacter cloacae complex NOT DETECTED NOT DETECTED Final   Escherichia coli NOT DETECTED NOT DETECTED Final   Klebsiella aerogenes NOT DETECTED NOT DETECTED Final   Klebsiella oxytoca NOT DETECTED NOT DETECTED Final   Klebsiella pneumoniae NOT DETECTED NOT DETECTED Final   Proteus species NOT DETECTED NOT DETECTED Final   Salmonella species NOT DETECTED NOT DETECTED Final   Serratia marcescens NOT DETECTED NOT DETECTED Final   Haemophilus influenzae NOT DETECTED NOT DETECTED Final   Neisseria meningitidis NOT DETECTED NOT DETECTED Final   Pseudomonas aeruginosa NOT DETECTED NOT DETECTED Final   Stenotrophomonas maltophilia NOT DETECTED NOT DETECTED Final   Candida albicans NOT DETECTED NOT DETECTED Final   Candida auris NOT DETECTED NOT DETECTED Final   Candida glabrata NOT DETECTED NOT DETECTED Final    Candida krusei NOT DETECTED NOT DETECTED Final   Candida parapsilosis NOT DETECTED NOT DETECTED Final   Candida tropicalis NOT DETECTED NOT DETECTED Final   Cryptococcus neoformans/gattii NOT DETECTED NOT DETECTED Final    Comment: Performed at [facility], [address] [place], [place] [address]  Resp panel by RT-PCR (RSV, Flu A&B, Covid) Anterior Nasal Swab     Status: None   Collection Time: 01/04/23  2:43 PM   Specimen: Anterior Nasal Swab  Result Value Ref Range Status   SARS Coronavirus 2 by RT PCR NEGATIVE NEGATIVE Final    Comment: (NOTE) SARS-CoV-2 target nucleic acids are NOT DETECTED.  The SARS-CoV-2 RNA is generally detectable in upper respiratory specimens during the acute phase of infection. The lowest concentration of SARS-CoV-2 viral copies this assay can detect is 138 copies/mL. A negative result does not preclude SARS-Cov-2 infection and should not be used as the sole basis for treatment or other patient management decisions. A negative result may occur with  improper specimen collection/handling, submission of specimen other than nasopharyngeal swab, presence of viral mutation(s) within the areas targeted by this assay, and inadequate number of viral copies(<138 copies/mL). A negative result must be combined with clinical observations, patient history, and epidemiological information. The expected result is Negative.  Fact Sheet for Patients:  BloggerCourse.com  Fact Sheet for Healthcare Providers:  SeriousBroker.it  This test is no t yet approved or cleared by the [place] FDA and  has been authorized for detection and/or diagnosis of SARS-CoV-2 by FDA under an Emergency Use Authorization (EUA). This EUA will remain  in effect (meaning this test can be used) for the duration of the COVID-19 declaration under Section 564(b)(1) of the Act, 21 U.S.C.section 360bbb-3(b)(1), unless  the authorization  is terminated  or revoked sooner.       Influenza A by PCR NEGATIVE NEGATIVE Final   Influenza B by PCR NEGATIVE NEGATIVE Final    Comment: (NOTE) The Xpert Xpress SARS-CoV-2/FLU/RSV plus assay is intended as an aid in the diagnosis of influenza from Nasopharyngeal swab specimens and should not be used as a sole basis for treatment. Nasal washings and aspirates are unacceptable for Xpert Xpress SARS-CoV-2/FLU/RSV testing.  Fact Sheet for Patients: BloggerCourse.com  Fact Sheet for Healthcare Providers: SeriousBroker.it  This test is not yet approved or cleared by the [place] FDA and has been authorized for detection and/or diagnosis of SARS-CoV-2 by FDA under an Emergency Use Authorization (EUA). This EUA will remain in effect (meaning this test can be used) for the duration of the COVID-19 declaration under Section 564(b)(1) of the Act, 21 U.S.C. section 360bbb-3(b)(1), unless the authorization is terminated or revoked.     Resp Syncytial Virus by PCR NEGATIVE NEGATIVE Final    Comment: (NOTE) Fact Sheet for Patients: BloggerCourse.com  Fact Sheet for Healthcare Providers: SeriousBroker.it  This test is not yet approved or cleared by the [place] FDA and has been authorized for detection and/or diagnosis of SARS-CoV-2 by FDA under an Emergency Use Authorization (EUA). This EUA will remain in effect (meaning this test can be used) for the duration of the COVID-19 declaration under Section 564(b)(1) of the Act, 21 U.S.C. section 360bbb-3(b)(1), unless the authorization is terminated or revoked.  Performed at [facility], [address], [place], [place] [address]     Antimicrobials: Anti-infectives (From admission, onward)    Start     Dose/Rate Route Frequency Ordered Stop   01/09/23 1000  vancomycin (VANCOREADY) IVPB 1250 mg/250 mL         1,250 mg 166.7 mL/hr over 90 Minutes Intravenous Every 24 hours 01/09/23 0742     01/09/23 0800  piperacillin-tazobactam (ZOSYN) IVPB 3.375 g        3.375 g 12.5 mL/hr over 240 Minutes Intravenous Every 8 hours 01/09/23 0742     01/04/23 1443  cefTRIAXone (ROCEPHIN) 1 g in sodium chloride 0.9 % 100 mL IVPB        1 g 200 mL/hr over 30 Minutes Intravenous  Once 01/04/23 1444 01/05/23 0012   01/04/23 1430  cefTRIAXone (ROCEPHIN) injection 1 g  Status:  Discontinued        1 g Intramuscular  Once 01/04/23 1421 01/04/23 1444   01/04/23 1430  vancomycin (VANCOCIN) IVPB 1000 mg/200 mL premix        1,000 mg 200 mL/hr over 60 Minutes Intravenous  Once 01/04/23 1421 01/05/23 0013      Culture/Microbiology    Component Value Date/Time   SDES  01/04/2023 1438    BLOOD RIGHT WRIST Performed at [facility], [address], [place], [place] [address]    SPECREQUEST  01/04/2023 1438    BOTTLES DRAWN AEROBIC ONLY Blood Culture results may not be optimal due to an inadequate volume of blood received in culture bottles Performed at [facility], [address], [place], [place] [address]    CULT (A) 01/04/2023 1438    STAPHYLOCOCCUS HOMINIS THE SIGNIFICANCE OF ISOLATING THIS ORGANISM FROM A SINGLE SET OF BLOOD CULTURES WHEN MULTIPLE SETS ARE DRAWN IS UNCERTAIN. PLEASE NOTIFY THE MICROBIOLOGY DEPARTMENT WITHIN ONE WEEK IF SPECIATION AND SENSITIVITIES ARE REQUIRED. Performed at [facility], [address] [place], [place] [address]  REPTSTATUS 01/06/2023 FINAL 01/04/2023 1438    Radiology Studies: [place] EKG SITE RITE  Result Date: 01/10/2023 If Site Rite image not attached, placement could not be confirmed due to current cardiac rhythm.    LOS: 5 days   Lanae Boast, MD [facility]  01/10/2023, 11:19 AM

## 2023-01-10 NOTE — Hospital Course (Addendum)
71 y.o.m w/ atrial fibrillation on apixaban, chronic pain, Stage IV NSCLC with involvement of ribs, pelvis and left humerus with pathologic fracture s/p palliative radiotherapy, medication induced diarrhea, DVT, EtOH abuse, GERD, OSA not on CPAP, and vertigo who presented to the ED with hypotension, decreased appetite and multiple wounds that are oozing blood, particularly his right upper back, and being treated for symptomatic anemia. Patient was transfused 2 unit PRBC Eliquis stopped. Oncology following restaging scan ordered-showing large pleural effusion with C/F empyema Extensive meeting was held with patient palliative care as well as oncology Palliative care informed that patient wants to go home with PICC line PCA pump and start hospice services at home

## 2023-01-10 NOTE — Plan of Care (Signed)
  Problem: Pain Managment: Goal: General experience of comfort will improve Outcome: Progressing   Problem: Nutrition: Goal: Adequate nutrition will be maintained Outcome: Not Progressing   Problem: Clinical Measurements: Goal: Diagnostic test results will improve Outcome: Adequate for Discharge Goal: Respiratory complications will improve Outcome: Adequate for Discharge Goal: Cardiovascular complication will be avoided Outcome: Adequate for Discharge   Problem: Education: Goal: Knowledge of General Education information will improve Description: Including pain rating scale, medication(s)/side effects and non-pharmacologic comfort measures Outcome: Completed/Met

## 2023-01-10 NOTE — Progress Notes (Signed)
PICC order received for discharge. Line will be placed on 7/9.

## 2023-01-10 NOTE — Care Management Important Message (Signed)
Important Message  Patient Details No IM Letter given Hospice Patient. Name: ANTONEY BELEW MRN: [id number] Date of Birth: September 08, 1950   Medicare Important Message Given:  No     Caren Macadam 01/10/2023, 12:49 PM

## 2023-01-11 ENCOUNTER — Other Ambulatory Visit: Payer: Medicare Other

## 2023-01-11 ENCOUNTER — Ambulatory Visit (HOSPITAL_COMMUNITY): Payer: Medicare Other

## 2023-01-11 DIAGNOSIS — D649 Anemia, unspecified: Secondary | ICD-10-CM | POA: Diagnosis not present

## 2023-01-11 MED ORDER — SENNA 8.6 MG PO TABS: 1.0000 | ORAL_TABLET | Freq: Every day | ORAL | 0 refills | Status: DC

## 2023-01-11 MED ORDER — SODIUM CHLORIDE 0.9% FLUSH: 10.0000 mL | INTRAVENOUS | Status: DC | PRN

## 2023-01-11 MED ORDER — SODIUM CHLORIDE 0.9% FLUSH: 10.0000 mL | Freq: Two times a day (BID) | INTRAVENOUS | Status: DC

## 2023-01-11 MED ORDER — ENSURE ENLIVE PO LIQD: 237.0000 mL | Freq: Two times a day (BID) | ORAL | 12 refills | Status: DC

## 2023-01-11 MED ORDER — ONDANSETRON HCL 4 MG PO TABS: 4.0000 mg | ORAL_TABLET | Freq: Four times a day (QID) | ORAL | 0 refills | Status: DC | PRN

## 2023-01-11 MED ORDER — CHLORHEXIDINE GLUCONATE CLOTH 2 % EX PADS
6.0000 | MEDICATED_PAD | Freq: Every day | CUTANEOUS | Status: DC
  Administered 2023-01-11: 6 via TOPICAL

## 2023-01-11 MED ORDER — MORPHINE SULFATE 1 MG/ML IV SOLN PCA: 2.0000 mg | INTRAVENOUS | 0 refills | Status: DC

## 2023-01-11 NOTE — Progress Notes (Signed)
Peripherally Inserted Central Catheter Placement  The IV Nurse has discussed with the patient and/or persons authorized to consent for the patient, the purpose of this procedure and the potential benefits and risks involved with this procedure.  The benefits include less needle sticks, lab draws from the catheter, and the patient may be discharged home with the catheter. Risks include, but not limited to, infection, bleeding, blood clot (thrombus formation), and puncture of an artery; nerve damage and irregular heartbeat and possibility to perform a PICC exchange if needed/ordered by physician.  Alternatives to this procedure were also discussed.  Bard Power PICC patient education guide, fact sheet on infection prevention and patient information card has been provided to patient /or left at bedside.    PICC Placement Documentation  PICC Single Lumen 01/11/23 Right Brachial 36 cm 0 cm (Active)  Indication for Insertion or Continuance of Line Home intravenous therapies (PICC only) 01/11/23 1551  Exposed Catheter (cm) 0 cm 01/11/23 1551  Site Assessment Clean, Dry, Intact 01/11/23 1551  Line Status Flushed;Saline locked;Blood return noted 01/11/23 1551  Dressing Type Transparent;Securing device 01/11/23 1551  Dressing Status Antimicrobial disc in place;Clean, Dry, Intact 01/11/23 1551  Safety Lock Not Applicable 01/11/23 1551  Line Care Connections checked and tightened 01/11/23 1551  Line Adjustment (NICU/IV Team Only) No 01/11/23 1551  Dressing Intervention New dressing;Other (Comment) 01/11/23 1551  Dressing Change Due 01/18/23 01/11/23 1551       Annett Fabian 01/11/2023, 3:53 PM

## 2023-01-11 NOTE — Progress Notes (Signed)
[facility]) Hospital Liaison Note  Plan is for patient to have PICC placed today. Plan is for dc tomorrow with home hospice services through [facility].   [facility] nurse will meet patient at the home to connect him to PCA. Admission visit has been scheduled for 2pm on 7.10.   Please call with any questions or concerns. Thank you  Dionicio Stall, [facility] Liaison [phone]

## 2023-01-11 NOTE — Progress Notes (Signed)
Daily Progress Note   Patient Name: Albert Perez       Date: 01/11/2023 DOB: September 02, 1950  Age: 72 y.o. MRN#: [id number] Attending Physician: Lanae Boast, MD Primary Care Physician: Henrine Screws, MD Admit Date: 01/04/2023 Length of Stay: 6 days  Reason for Consultation/Follow-up: Establishing goals of care and symptom management  Subjective:   CC: Patient is awake alert, resting in bed, remains on PCA and is asking about discharge home  following up regarding complex medical decision making and symptom management.  Subjective:  EMR prior to presenting to bedside.  PCA needs noted, met with patient at bedside, met with wife who was also present.  Discussed about symptom management.  Outside the room, also discussed with nursing colleagues and hospice colleague. Plan remains for discharge home on PCA-prescription completed Patient to undergo PICC line placement prior to discharge home with hospice services with continuation of PCA  All questions answered at that time.  Noted palliative medicine team will continue to follow along with patient's medical journey.  Thank patient and his wife for allowing me to visit today.  Updated IDT after visit with patient to coordinate care.  Review of Systems Pain improved Objective:   Vital Signs:  BP 128/64 (BP Location: Left Arm)   Pulse 76   Temp 98.3 F (36.8 C) (Oral)   Resp 12   Ht 5\' 7"  (1.702 m)   Wt 56.8 kg   SpO2 94%   BMI 19.61 kg/m   Physical Exam: General: NAD, alert, pleasant, frail, cachectic  Eyes: No drainage noted HENT: Dry mucous membranes Cardiovascular: RRR Respiratory: no increased work of breathing noted, not in respiratory distress Abdomen: not distended Extremities: Muscle wasting present in all extremities Skin: Multiple ecchymoses on upper extremities bilaterally; wound reviewed in EMR pictures Neuro: A&Ox4, following commands easily Psych: appropriately answers all questions  Imaging:  I personally  reviewed recent imaging.   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  Palliative medicine team consulted to assist with complex medical decision making and symptom management. Of note patient seen by outpatient palliative medicine team at [facility].  Recommendations/Plan: # Complex medical decision making/goals of care:   -Patient transitioned to comfort care on 7/8 after meeting with patient and his wife. Patient and wife planning to go home with hospice support via [facility]. Will discontinue lab work. Continue abx for comfort and should be discharged on oral abx regimen to allow for comfortable time at home. Dr. Shirline Frees to visit today.                 - Code Status: DNR  Prognosis: weeks   # Symptom management                -Pain/dyspnea, in the setting of metastatic NSCLC                               -Discontinue oral opioids as no  longer managing patient's pain management at this time.                                -Increase IV morphine PCA to 5mg  bolus q 10 minus.  Continue to adjust based on patient's symptom burden. Discussed with patient and will hold on starting continuous infusion at this time. Ordered for PICC line placement so can go home with PCA. [facility] hospice noted could support this.   # Psycho-social/Spiritual Support:  - Support System: wife  # Discharge Planning: Home with Hospice via [facility] once  care coordinated (equipment delivered to house, PCA ordered, PICC in place).   Discussed with: patient, patient's wife, TOC, [facility] liaison, hospitalist Thank you for allowing the palliative care team to participate in the care Asa Adela Ports.  MOD MDM Rosalin Hawking, MD Palliative Care Provider PMT # [phone]  If patient remains symptomatic despite maximum doses, please call PMT at [phone] between 0700 and 1900. Outside of these hours, please call attending, as PMT does not have night coverage.  *Please note that this is a verbal dictation therefore any spelling or grammatical errors are due to the "Dragon Medical One" system interpretation.

## 2023-01-11 NOTE — Progress Notes (Signed)
Chaplain met with Mickie's wife who had some documents that she needed notarized as [facility] transitions to Hospice.  Chaplain relayed the hospital policy that our notaries can only notarize healthcare related documents.  She did have a form for HCPOA, but the person her husband wished to assign was herself, and the two are legally married and she is therefore his next of kin.  Chaplain let them know of other options for getting other legal documents notarized.  Chaplain also assessed for other emotional and spiritual needs, but they stated that they were okay at this time.  If needs arise, please page [place] at [phone].  Thanks! Orpha Bur

## 2023-01-11 NOTE — Progress Notes (Signed)
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 Patient was transfused 2 unit PRBC Eliquis stopped. Oncology following restaging scan ordered-showing large pleural effusion with C/F empyema Extensive meeting was held with patient palliative care as well as oncology Palliative care has evaluated and after discussion patient wants to go home with PICC line PCA pump and start hospice services at home Pain has been well-controlled on PCA pump although some drowsiness and patient and wife would like to continue PCA pump at home after getting PICC line and anticipating discharge home with hospice service.         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)   ABLA (acute blood loss anemia)   Pressure injury of skin   Failure to thrive in adult   Need for emotional support   High risk medication use   Cancer associated pain   Counseling and coordination of care   Palliative care encounter   DNR (do not resuscitate)    End-of-life care: Patient with metastatic non-small cell lung cancer with  multiple metastasis, not going home with DNR and PCA pump with PICC line for end-of-life care with home  Acute on chronic pain due to cancer Stage IV NSCLC Moderate right loculated pleural effusion: Palliative care oncology following-continue to adjust pain management MRI and CT ordered by Dr. Shirline Frees  showed> extensive metastatic disease of right ilium and ischium with bony destruction and pathologic fracture; edema b/l gluteal muscles.New moderate right loculated pleural effusion with c/f empyema and bronchopleural fistula> placed on empiric antibiotics. Per Dr. Shirline Frees note, due to transportation difficulties wants to complete staging work up during this hospitalization> see the CT result.   After further discussion with palliative care and oncology patient has decided to go home with hospice with PCA pump after PICC line placement today   Symptomatic anemia-ABLA Oozing Pressure Wound on Back: ABLA likely from oozing of patient's wound on the back.  5.4 g on admission S/P units PRBC and hemoglobin improved. His apixaban was held on admission, risks and benefits of this medication were discussed.  His hemoglobin is stable for several days at this point, primary reason for continued hospitalization is for work up of cancer as below.    Dysphagia SLP has evaluated continue thin liquid pureed   Suspected hypovolemic hyponatremia Improved    Atrial fibrillation Hx of pulmonary emboli Patient had been on apixaban for CHA2DS2-VASc 2 score of 5 and history of pulmonary emboli, however given continuous oozing of wound on back decreased hemoglobin, has been held   HTN: Remains soft, holding Coreg, diuretics  Discharge being planned for am.   Pressure injury sacrum left  and right discharged to, left posterior right stage III POA see below     Pressure Injury 01/04/23 Sacrum Left;Right Stage 2 -  Partial thickness loss of dermis presenting as a shallow open injury with a red, pink wound bed  without slough. (Active)  01/04/23 2100  Location: Sacrum  Location Orientation: Left;Right  Staging: Stage 2 -  Partial thickness loss of dermis presenting as a shallow open injury with a red, pink wound bed without slough.  Wound Description (Comments):   Present on Admission: Yes  Dressing Type Foam - Lift dressing to assess site every shift 01/09/23 0900     Pressure Injury 01/04/23 Leg Posterior;Right;Left Stage 3 -  Full thickness tissue loss. Subcutaneous fat may be visible but bone, tendon or muscle are NOT exposed. this is a full thickness wound, NOT a pressure injury (Active)  01/04/23 2100  Location: Leg  Location Orientation: Posterior;Right;Left  Staging: Stage 3 -  Full thickness tissue loss. Subcutaneous fat may be visible but bone, tendon or muscle are NOT exposed.  Wound Description (Comments): this is a full thickness wound, NOT a pressure injury  Present on Admission: Yes  Dressing Type ABD;Honey 01/09/23 0900   DVT prophylaxis: none for for comfort Code Status:   Code Status: DNR Family Communication: plan of care discussed with patient/wife at bedside. Patient status is: Inpatient because of acute on chronic encephalopathy Level of care: Telemetry   Dispo: The patient is from: home            Anticipated disposition: home w/ hospice in am. Objective: Vitals last 24 hrs: Vitals:   01/11/23 0816 01/11/23 1137 01/11/23 1142 01/11/23 1337  BP:    (!) 105/52  Pulse:    77  Resp: 12 16 16 14   Temp:    99.4 F (37.4 C)  TempSrc:    Oral  SpO2: 94% 94% 94% 91%  Weight:      Height:       Weight change:   Physical Examination: General exam: AA,,weak, appears weak and older than stated HEENT:Oral mucosa moist, Ear/Nose WNL grossly, dentition normal. Respiratory system: bilaterally diminished BS, no use of accessory muscle Cardiovascular system: S1 & S2 +, regular rate. Gastrointestinal system: Abdomen soft, NT,ND,BS+ Nervous System:Alert, awake, moving  extremities and grossly nonfocal Extremities: LE ankle edema +, lower extremities warm Skin:No rashes,no icterus. ZOX:WRUEAV/WUJWJ/XBJY muscle bulk,tone, power   Medications reviewed:  Scheduled Meds:  bacitracin  1 Application Topical BID   Chlorhexidine Gluconate Cloth  6 each Topical Daily   feeding supplement  237 mL Oral BID BM   leptospermum manuka honey  1 Application Topical Daily   morphine   Intravenous Q4H   senna  1 tablet Oral Daily   sodium chloride flush  10-40 mL Intracatheter Q12H   Continuous Infusions:  piperacillin-tazobactam (ZOSYN)  IV 3.375 g (01/11/23 1019)   vancomycin 1,250 mg (01/11/23 1018)   Diet Order             Diet regular Room service appropriate? Yes; Fluid consistency: Thin  Diet effective now                   Intake/Output Summary (Last 24 hours) at 01/11/2023 1602 Last data filed at 01/11/2023 1500 Gross per 24 hour  Intake 662.59 ml  Output 850 ml  Net -187.41 ml   Net IO Since Admission: 3,845.62 mL [01/11/23 1602]  Wt Readings from Last 3 Encounters:  01/04/23 56.8 kg  10/19/22 65.8 kg  08/04/22 74.4 kg     Unresulted Labs (From admission, onward)    None     Alert data Reviewed: I have personally reviewed following labs and imaging studies CBC: Recent Labs  Lab 01/06/23 0444 01/07/23 0458 01/08/23 0456 01/09/23 0533 01/10/23 0406  WBC 10.0 9.5 9.9 10.6* 11.6*  NEUTROABS  --  7.4  --   --   --   HGB 8.2* 7.5* 7.7* 8.0* 7.6*  HCT 25.3* 23.7* 25.3* 26.3* 24.4*  MCV 93.0 94.0 96.9 95.3 96.1  PLT 322 298 285 294 320   Basic Metabolic Panel: Recent Labs  Lab 01/06/23 0444 01/07/23 0458 01/08/23 0456 01/09/23 0533 01/10/23 0406  NA 134* 135 134* 132* 133*  K 3.8 4.0 4.0 4.0 4.0  CL 102 105 105 103 102  CO2 25 24 24 25 25   GLUCOSE 116* 120* 120* 104* 116*  BUN 19 17 13 13 13   CREATININE 0.65 0.60* 0.47* 0.54* 0.51*  CALCIUM 7.7* 7.2* 7.4* 7.6* 7.6*  MG  --   --   --  1.7 1.7   Recent Labs  Lab  01/05/23 0739 01/09/23 0533 01/10/23 0406  AST 13* 24 19  ALT 13 19 17   ALKPHOS 117 117 120  BILITOT 0.6 0.6 0.5  PROT 4.6* 4.5* 4.5*  ALBUMIN <1.5* <1.5* <1.5*   No results for input(s): "INR", "PROTIME" in the last 168 hours.  Recent Labs  Lab 01/04/23 2109 01/05/23 0739  LATICACIDVEN 2.4* 0.9   Recent Results (from the past 240 hour(s))  Blood Culture (routine x 2)     Status: None   Collection Time: 01/04/23  2:31 PM   Specimen: BLOOD LEFT FOREARM  Result Value Ref Range Status   Specimen Description   Final    BLOOD LEFT FOREARM Performed at [facility], [address] [place], [place] [address]    Special Requests   Final    BOTTLES DRAWN AEROBIC AND ANAEROBIC Blood Culture results may not be optimal due to an excessive volume of blood received in culture bottles Performed at [facility], [address], [place], [place] [address]    Culture   Final    NO GROWTH 5 DAYS Performed at [facility], [address] [place], [place] [address]    Report Status 01/09/2023 FINAL  Final  Blood Culture (routine x 2)     Status: Abnormal   Collection Time: 01/04/23  2:38 PM   Specimen: BLOOD RIGHT WRIST  Result Value Ref Range Status   Specimen Description   Final    BLOOD RIGHT WRIST Performed at [facility], [address], [place], [place] [address]    Special Requests   Final    BOTTLES DRAWN AEROBIC ONLY Blood Culture results may not be optimal due to an inadequate volume of blood received in culture bottles Performed at [facility], [address] [place], [place] [address]    Culture  Setup Time   Final    GRAM POSITIVE COCCI IN CLUSTERS AEROBIC BOTTLE ONLY CRITICAL RESULT CALLED TO, READ BACK BY AND VERIFIED WITH: PHARMD MICHELLE LILLISTON ON 01/05/23 @ 1911 BY DRT    Culture (A)  Final    STAPHYLOCOCCUS HOMINIS THE SIGNIFICANCE OF ISOLATING THIS ORGANISM FROM A SINGLE SET OF BLOOD CULTURES WHEN  MULTIPLE SETS ARE DRAWN IS UNCERTAIN. PLEASE NOTIFY THE MICROBIOLOGY DEPARTMENT WITHIN ONE WEEK IF SPECIATION AND SENSITIVITIES ARE REQUIRED. Performed at [facility], [address] [place], [place] [address]    Report Status 01/06/2023 FINAL  Final  Blood Culture ID Panel (Reflexed)     Status: Abnormal   Collection Time: 01/04/23  2:38 PM  Result Value Ref Range Status   Enterococcus faecalis NOT DETECTED NOT DETECTED Final   Enterococcus Faecium NOT DETECTED NOT DETECTED Final   Listeria monocytogenes NOT DETECTED NOT DETECTED Final   Staphylococcus species DETECTED (A) NOT DETECTED Final    Comment: CRITICAL RESULT CALLED TO, READ BACK BY AND VERIFIED WITH: PHARMD MICHELLE LILLISTON ON 01/05/23 @ 1911 BY DRT    Staphylococcus aureus (BCID) NOT DETECTED NOT DETECTED Final   Staphylococcus epidermidis NOT DETECTED NOT DETECTED Final   Staphylococcus lugdunensis NOT DETECTED NOT DETECTED Final   Streptococcus species NOT DETECTED NOT DETECTED Final   Streptococcus agalactiae NOT DETECTED NOT DETECTED Final   Streptococcus pneumoniae NOT DETECTED NOT DETECTED Final   Streptococcus pyogenes NOT DETECTED NOT DETECTED Final   A.calcoaceticus-baumannii NOT DETECTED NOT DETECTED Final   Bacteroides fragilis NOT DETECTED NOT DETECTED Final   Enterobacterales NOT DETECTED NOT DETECTED Final   Enterobacter cloacae complex NOT DETECTED NOT DETECTED Final   Escherichia coli NOT DETECTED NOT DETECTED Final   Klebsiella aerogenes NOT DETECTED NOT DETECTED Final   Klebsiella oxytoca NOT DETECTED NOT DETECTED Final   Klebsiella pneumoniae NOT DETECTED NOT DETECTED Final   Proteus species NOT DETECTED NOT DETECTED Final   Salmonella species NOT DETECTED NOT DETECTED Final   Serratia marcescens NOT DETECTED NOT DETECTED Final   Haemophilus influenzae NOT DETECTED NOT DETECTED Final   Neisseria meningitidis NOT DETECTED NOT DETECTED Final   Pseudomonas aeruginosa NOT DETECTED NOT DETECTED  Final   Stenotrophomonas maltophilia NOT DETECTED NOT DETECTED Final   Candida albicans NOT DETECTED NOT DETECTED Final   Candida auris NOT DETECTED NOT DETECTED Final   Candida glabrata NOT DETECTED NOT DETECTED Final   Candida krusei NOT DETECTED NOT DETECTED Final   Candida parapsilosis NOT DETECTED NOT DETECTED Final   Candida tropicalis NOT DETECTED NOT DETECTED Final   Cryptococcus neoformans/gattii NOT DETECTED NOT DETECTED Final    Comment: Performed at [facility], [address] [place], [place] [address]  Resp panel by RT-PCR (RSV, Flu A&B, Covid) Anterior Nasal Swab     Status: None   Collection Time: 01/04/23  2:43 PM   Specimen: Anterior Nasal Swab  Result Value Ref Range Status   SARS Coronavirus 2 by RT PCR NEGATIVE NEGATIVE Final    Comment: (NOTE) SARS-CoV-2 target nucleic acids are NOT DETECTED.  The SARS-CoV-2 RNA is generally detectable in upper respiratory specimens during the acute phase of infection. The lowest concentration of SARS-CoV-2 viral copies this assay can detect is 138 copies/mL. A negative result does not preclude SARS-Cov-2 infection and should not be used as the sole basis for treatment or other patient management decisions. A negative result may occur with  improper specimen collection/handling, submission of specimen other than nasopharyngeal swab, presence of viral mutation(s) within the areas targeted by this assay, and inadequate number of viral copies(<138 copies/mL). A negative result must be combined with clinical observations, patient history, and epidemiological information. The expected result is Negative.  Fact Sheet for Patients:  BloggerCourse.com  Fact Sheet for Healthcare Providers:  SeriousBroker.it  This test is no t yet approved or cleared by the [place] FDA and  has been authorized for detection and/or diagnosis of SARS-CoV-2 by FDA under an Emergency Use  Authorization (EUA). This EUA will remain  in effect (meaning this test can be used) for the duration of the COVID-19 declaration under Section 564(b)(1) of the Act, 21 U.S.C.section 360bbb-3(b)(1), unless the authorization is terminated  or revoked sooner.       Influenza A by PCR NEGATIVE NEGATIVE Final   Influenza B by PCR NEGATIVE NEGATIVE Final    Comment: (NOTE) The Xpert Xpress SARS-CoV-2/FLU/RSV plus assay is intended as an aid in the diagnosis of influenza from Nasopharyngeal swab specimens and should not be used as a sole basis for treatment. Nasal washings and aspirates are unacceptable for Xpert Xpress SARS-CoV-2/FLU/RSV testing.  Fact Sheet for Patients: BloggerCourse.com  Fact Sheet for Healthcare Providers: SeriousBroker.it  This test is not yet approved or cleared by the [place] FDA and has been authorized for detection and/or diagnosis of SARS-CoV-2 by FDA under an Emergency Use Authorization (EUA). This EUA will remain in effect (meaning this test can be used) for the duration of the COVID-19 declaration under Section 564(b)(1) of the Act, 21 U.S.C. section 360bbb-3(b)(1), unless the authorization is terminated or revoked.     Resp Syncytial Virus by PCR NEGATIVE NEGATIVE Final    Comment: (NOTE) Fact Sheet for Patients: BloggerCourse.com  Fact Sheet for Healthcare Providers: SeriousBroker.it  This test is not yet approved or cleared by the [place] FDA and has been authorized for detection and/or diagnosis of SARS-CoV-2 by FDA under an Emergency Use Authorization (EUA). This EUA will remain in effect (meaning this test can be used) for the duration of the COVID-19 declaration under Section 564(b)(1) of the Act, 21 U.S.C. section 360bbb-3(b)(1), unless the authorization is terminated or revoked.  Performed at [facility],  [address], [place], [place] [address]     Antimicrobials: Anti-infectives (From admission, onward)    Start     Dose/Rate Route Frequency Ordered Stop   01/09/23 1000  vancomycin (VANCOREADY) IVPB 1250 mg/250 mL        1,250 mg 166.7 mL/hr over 90 Minutes Intravenous Every 24 hours 01/09/23 0742     01/09/23 0800  piperacillin-tazobactam (ZOSYN) IVPB 3.375 g        3.375 g 12.5 mL/hr over 240 Minutes Intravenous Every 8 hours 01/09/23 0742     01/04/23 1443  cefTRIAXone (ROCEPHIN) 1 g in sodium chloride 0.9 % 100 mL IVPB        1 g 200 mL/hr over 30 Minutes Intravenous  Once 01/04/23 1444 01/05/23 0012   01/04/23 1430  cefTRIAXone (ROCEPHIN) injection 1 g  Status:  Discontinued        1 g Intramuscular  Once 01/04/23 1421 01/04/23 1444   01/04/23 1430  vancomycin (VANCOCIN) IVPB 1000 mg/200 mL premix        1,000 mg 200 mL/hr over 60 Minutes Intravenous  Once 01/04/23 1421 01/05/23 0013      Culture/Microbiology    Component Value Date/Time   SDES  01/04/2023 1438    BLOOD RIGHT WRIST Performed at [facility], [address] [place], [place] [address]    SPECREQUEST  01/04/2023 1438    BOTTLES DRAWN AEROBIC ONLY Blood Culture results may not be optimal due to an inadequate volume of blood received in culture bottles Performed at [facility], [address] [place], [place] [address]    CULT (A) 01/04/2023 1438    STAPHYLOCOCCUS HOMINIS THE SIGNIFICANCE OF ISOLATING THIS ORGANISM FROM A SINGLE SET OF BLOOD CULTURES WHEN MULTIPLE SETS  ARE DRAWN IS UNCERTAIN. PLEASE NOTIFY THE MICROBIOLOGY DEPARTMENT WITHIN ONE WEEK IF SPECIATION AND SENSITIVITIES ARE REQUIRED. Performed at [facility], [address] [place], [place] [address]    REPTSTATUS 01/06/2023 FINAL 01/04/2023 1438    Radiology Studies: [place] EKG SITE RITE  Result Date: 01/10/2023 If Site Rite image not attached, placement could not be confirmed due to current cardiac rhythm.     LOS: 6 days   Lanae Boast, MD [facility]  01/11/2023, 4:02 PM

## 2023-01-11 NOTE — TOC Transition Note (Addendum)
Transition of Care [facility]) - CM/SW Discharge Note   Patient Details  Name: Albert Perez MRN: [id number] Date of Birth: Jul 11, 1950  Transition of Care [facility]) CM/SW Contact:  Howell Rucks, RN Phone Number: 01/11/2023, 12:06 PM   Clinical Narrative:   Patient to dc home today with Home Hospice through [facility]. No further TOC needs identified.  - 2:00pm DC cancel for today due to late delivery of home DME, anticipate dc tomorrow per [facility] with [facility].    Final next level of care: Home w Hospice Care Barriers to Discharge: Barriers Resolved   Patient Goals and CMS Choice CMS Medicare.gov Compare Post Acute Care list provided to:: Patient Choice offered to / list presented to : Patient  Discharge Placement                         Discharge Plan and Services Additional resources added to the After Visit Summary for                              [facility] Agency: [facility] Agency Contacted: 01/11/23 Time HH Agency Contacted: 1205 Representative spoke with at [facility] Agency: Danford Bad  Social Determinants of Health (SDOH) Interventions SDOH Screenings   Food Insecurity: No Food Insecurity (01/04/2023)  Housing: Low Risk  (01/04/2023)  Transportation Needs: No Transportation Needs (01/05/2023)  Recent Concern: Transportation Needs - Unmet Transportation Needs (01/04/2023)  Utilities: Not At Risk (01/04/2023)  Tobacco Use: High Risk (01/06/2023)     Readmission Risk Interventions    01/05/2023   12:43 PM  Readmission Risk Prevention Plan  Transportation Screening Complete  PCP or Specialist Appt within 5-7 Days Complete  Home Care Screening Complete  Medication Review (RN CM) Complete

## 2023-01-12 DIAGNOSIS — D649 Anemia, unspecified: Secondary | ICD-10-CM | POA: Diagnosis not present

## 2023-01-12 MED ORDER — HEPARIN SOD (PORK) LOCK FLUSH 100 UNIT/ML IV SOLN
250.0000 [IU] | INTRAVENOUS | Status: AC | PRN
  Administered 2023-01-12: 250 [IU]

## 2023-01-12 NOTE — Discharge Summary (Signed)
PROGRESS NOTE  RAMIREZ FULLBRIGHT  DOB: 1950/10/14  PCP: Henrine Screws, MD WUJ:[id number]  DOA: 01/04/2023  LOS: 7 days  Hospital Day: 9  Brief narrative: Albert Perez is a 72 y.o. male with PMH significant for Stage IV NSCLC with involvement of ribs, pelvis and left humerus with pathologic fracture s/p palliative radiotherapy; also with history of DVT, A-fib on Eliquis, chronic pain, medication induced diarrhea, EtOH abuse, GERD, OSA not on CPAP 7/2, patient presented to the ED with poor oral intake, low blood pressure and blood wheezing out of multiple wounds particularly his right upper back  Admitted to [facility] Blood transfusion given.  Eliquis was stopped. Oncology was consulted.   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   7/9, PICC line was inserted.  Pain management with morphine PCA pump started. Planned for home with hospice today.  Subjective: Patient was seen and examined this morning.  Elderly male.  Lying on bed.  Not in distress.  Pain controlled with PCA pump. In the last 24 hours, afebrile, on 5 L oxygen  Hospital course: End-of-life care Stage IV NSCLC Patient with metastatic non-small cell lung cancer with multiple metastasis Palliative care and oncology consults appreciated. DNR status. Planned for discharge to home with hospice today  Acute on chronic pain due to cancer 7/9, PICC line was inserted.  Pain management with morphine PCA pump started.  Moderate right loculated pleural effusion CT chest 7/4 showed new moderate right  loculated pleural effusion with c/f empyema and bronchopleural fistula> placed on empiric antibiotics.  Completed 1 week course.   Acute blood loss anemia Low hemoglobin of 5.4 on admission due to oozing pressure wound on back PRBCs transfused.  Apixaban held.   Hemoglobin stable now.     Dysphagia SLP has evaluated continue thin liquid pureed   Atrial fibrillation Hx of pulmonary emboli Was chronically on Eliquis.  Held now because of blood loss anemia   HTN BP meds on hold.  Wounds:  - Pressure Injury 01/04/23 Sacrum Left;Right Stage 2 -  Partial thickness loss of dermis presenting as a shallow open injury with a red, pink wound bed without slough. (Active)  Date First Assessed/Time First Assessed: 01/04/23 2100   Location: Sacrum  Location Orientation: Left;Right  Staging: Stage 2 -  Partial thickness loss of dermis presenting as a shallow open injury with a red, pink wound bed without slough.  Present o...    Assessments 01/04/2023  9:00 PM 01/12/2023 10:50 AM  Dressing Type Foam - Lift dressing to assess site every shift Foam - Lift dressing to assess site every shift  Dressing Clean, Dry, Intact --  Dressing Change Frequency PRN --  State of Healing Early/partial granulation --  Site / Wound Assessment Pink;Yellow;Dry --  % Wound base Red or Granulating 100% --  Wound Length (cm) 5 cm --  Wound Width (cm) 2 cm --  Wound Depth (cm) 0.2 cm --  Wound Surface Area (cm^2) 10 cm^2 --  Wound Volume (cm^3) 2 cm^3 --  Drainage Amount None --  Treatment Cleansed --     No associated orders.  Pressure Injury 01/04/23 Leg Posterior;Right;Left Stage 3 -  Full thickness tissue loss. Subcutaneous fat may be visible but bone, tendon or muscle are NOT exposed. this is a full thickness wound, NOT a pressure injury (Active)  Date First Assessed/Time First Assessed: 01/04/23 2100   Location: Leg  Location Orientation: Posterior;Right;Left  Staging: Stage 3 -  Full thickness tissue loss.  Subcutaneous fat may be visible but bone, tendon or muscle are NOT exposed.  Wound Desc...    Assessments 01/04/2023  9:00 PM 01/12/2023 10:50 AM  Dressing Type Abdominal pads Abdominal pads;Honeycomb;Gauze (Comment)  Dressing Clean, Dry, Intact --  Dressing Change Frequency Twice a day --  State of Healing Non-healing --  Site / Wound Assessment Bleeding --  Drainage Amount Moderate --  Drainage Description Serosanguineous --  Treatment Cleansed --     No associated orders.     Wound / Incision (Open or Dehisced) 01/04/23 Skin tear Back Upper;Left;Right Bleeding (Active)  Date First Assessed/Time First Assessed: 01/04/23 2100   Wound Type: Skin tear  Location: Back  Location Orientation: Upper;Left;Right  Wound Description (Comments): Bleeding  Present on Admission: Yes    Assessments 01/04/2023  9:00 PM 01/12/2023 10:50 AM  Dressing Type Abdominal pads Foam - Lift dressing to assess site every shift  Dressing Changed New --  Dressing Status Clean, Dry, Intact --  Dressing Change Frequency Twice a day --  Site / Wound Assessment Bleeding;Red --  % Wound base Red or Granulating 100% --  Drainage Amount Moderate --  Drainage Description Sanguineous --  Treatment Cleansed --     No associated orders.     Wound / Incision (Open or Dehisced) 01/05/23 Other (Comment) Leg Right;Lower (Active)  Date First Assessed: 01/05/23   Wound Type: Other (Comment)  Location: Leg  Location Orientation: Right;Lower  Present on Admission: Yes    Assessments 01/05/2023  9:30 AM 01/12/2023 10:50 AM  Dressing Type Abdominal pads;Honey Foam - Lift dressing to assess site every shift  Dressing Changed New --  Dressing Status Clean, Dry, Intact --  Dressing Change Frequency Twice a day --  Site / Wound Assessment Bleeding;Painful;Pink;Other (Comment) --  Peri-wound Assessment Pink;Denuded --  Margins Unattached edges (unapproximated) --  Closure Other (Comment) --  Drainage Amount Moderate --  Drainage  Description Serosanguineous;Sanguineous;Purulent --  Treatment Cleansed;Other (Comment) --     No associated orders.     Wound / Incision (Open or Dehisced) 01/05/23 Other (Comment) Leg Right;Lateral (Active)  Date First Assessed: 01/05/23   Wound Type: Other (Comment)  Location: Leg  Location Orientation: Right;Lateral  Present on Admission: Yes    Assessments 01/05/2023  9:30 AM 01/12/2023 10:50 AM  Dressing Type Abdominal pads;Honey Foam - Lift dressing to assess site every shift  Dressing Changed New --  Dressing Status Clean, Dry, Intact --  Dressing Change Frequency Twice a day --  Site / Wound Assessment Bleeding;Painful;Pink;Other (Comment) --  Peri-wound Assessment Pink;Denuded --  Margins Unattached edges (unapproximated) --  Closure Other (Comment) --  Drainage Amount Moderate --  Drainage Description Purulent;Sanguineous;Serosanguineous --  Treatment Cleansed;Other (Comment) --     No associated orders.    Discharge Exam:   Vitals:   01/12/23 0342 01/12/23 0415 01/12/23 0759 01/12/23 1129  BP:  103/82  121/69  Pulse:  (!) 109  97  Resp: 19 17 19 16   Temp:  99.3 F (37.4 C)  97.6 F (36.4 C)  TempSrc:  Oral  Oral  SpO2: 90% 93% 92% 100%  Weight:      Height:        Body mass index is 19.61 kg/m.  General exam: Elderly.  Not in distress Skin: No rashes, lesions or ulcers. HEENT: Atraumatic, normocephalic, no obvious bleeding Lungs: Diminished air entry both bases  CVS: Regular rate and rhythm, no murmur GI/Abd soft, nontender, nondistended, bowel sound present CNS: Alert, awake Psychiatry: Sad affect Extremities: No pedal edema, no calf tenderness  Follow ups:    Follow-up Information     Henrine Screws, MD Follow up in 1 week(s).   Specialty: Family Medicine Contact information: [address] [place] [address] [phone]                 Discharge Instructions:   Discharge Instructions     Discharge instructions   Complete by:  As directed    Please follow-up with home hospice team on discharge today   Discharge wound care:   Complete by: As directed    . Apply Medihoney to all wounds on right inner/outer leg Q day, then cover with ABD pads and ace wrap 2. Foam dressings to upper back bleeding lesions, change Q 3 days or PRN soiling 3. Foam dressing to sacrum, change Q 3 days or PRN soiling   Increase activity slowly   Complete by: As directed        Discharge Medications:   Allergies as of 01/12/2023   No Known Allergies      Medication List     STOP taking these medications    apixaban 5 MG Tabs tablet Commonly known as: Eliquis   carvedilol 6.25 MG tablet Commonly known as: COREG   cyanocobalamin 1000 MCG tablet Commonly known as: VITAMIN B12   furosemide 20 MG tablet Commonly known as: LASIX   oxyCODONE-acetaminophen 7.5-325 MG tablet Commonly known as: Percocet   potassium chloride 10 MEQ tablet Commonly known as: KLOR-CON   rosuvastatin 5 MG tablet Commonly known as: CRESTOR   Tagrisso 80 MG tablet Generic drug: osimertinib mesylate   tepotinib hcl 225 MG tablet Commonly known as: TEPMETKO   torsemide 20 MG tablet Commonly known as: DEMADEX   Vitamin D-3 25 MCG (1000 UT) Caps       TAKE these medications    bacitracin ointment Apply 1 Application topically 2 (two) times daily.   feeding supplement Liqd Take 237 mLs by mouth 2 (two) times daily between meals.   loperamide 2 MG capsule Commonly known as: IMODIUM Take 2 mg by mouth as needed for diarrhea or loose stools.   morphine 1 mg/mL injection Commonly known as: MORPHINE Inject 2 mLs (2 mg total) into the vein every 4 (four) hours.   ondansetron 4 MG tablet Commonly known as: ZOFRAN Take 1 tablet (4 mg total) by mouth every 6 (six) hours as needed for nausea.   Santyl 250 UNIT/GM ointment Generic drug: collagenase Apply 1 Application topically daily.   senna 8.6 MG Tabs tablet Commonly known as:  SENOKOT Take 1 tablet (8.6 mg total) by mouth daily.               Discharge Care Instructions  (From admission, onward)           Start     Ordered   01/11/23 0000  Discharge wound care:       Comments: . Apply Medihoney to all wounds on right inner/outer leg Q day, then cover with ABD pads and ace wrap 2. Foam dressings to upper  back bleeding lesions, change Q 3 days or PRN soiling 3. Foam dressing to sacrum, change Q 3 days or PRN soiling   01/11/23 1200             The results of significant diagnostics from this hospitalization (including imaging, microbiology, ancillary and laboratory) are listed below for reference.    Procedures and Diagnostic Studies:   DG Chest Port 1 View  Result Date: 01/04/2023 CLINICAL DATA:  Decreased appetite, hypotensive EXAM: PORTABLE CHEST 1 VIEW COMPARISON:  03/28/2019 chest radiograph, correlation is also made with 10/26/2022 CT chest FINDINGS: Unchanged cardiac and mediastinal contours, with aortic atherosclerosis. Layering right pleural effusion, likely moderate, with associated atelectasis. No focal opacity in the left lung. No pneumothorax. No acute osseous abnormality. IMPRESSION: Layering right pleural effusion, likely moderate, with associated atelectasis. Electronically Signed   By: Wiliam Ke M.D.   On: 01/04/2023 16:00     Labs:   Basic Metabolic Panel: Recent Labs  Lab 01/06/23 0444 01/07/23 0458 01/08/23 0456 01/09/23 0533 01/10/23 0406  NA 134* 135 134* 132* 133*  K 3.8 4.0 4.0 4.0 4.0  CL 102 105 105 103 102  CO2 25 24 24 25 25   GLUCOSE 116* 120* 120* 104* 116*  BUN 19 17 13 13 13   CREATININE 0.65 0.60* 0.47* 0.54* 0.51*  CALCIUM 7.7* 7.2* 7.4* 7.6* 7.6*  MG  --   --   --  1.7 1.7   GFR Estimated Creatinine Clearance: 67.1 mL/min (A) (by C-G formula based on SCr of 0.51 mg/dL (L)). Liver Function Tests: Recent Labs  Lab 01/09/23 0533 01/10/23 0406  AST 24 19  ALT 19 17  ALKPHOS 117 120   BILITOT 0.6 0.5  PROT 4.5* 4.5*  ALBUMIN <1.5* <1.5*   No results for input(s): "LIPASE", "AMYLASE" in the last 168 hours. No results for input(s): "AMMONIA" in the last 168 hours. Coagulation profile No results for input(s): "INR", "PROTIME" in the last 168 hours.  CBC: Recent Labs  Lab 01/06/23 0444 01/07/23 0458 01/08/23 0456 01/09/23 0533 01/10/23 0406  WBC 10.0 9.5 9.9 10.6* 11.6*  NEUTROABS  --  7.4  --   --   --   HGB 8.2* 7.5* 7.7* 8.0* 7.6*  HCT 25.3* 23.7* 25.3* 26.3* 24.4*  MCV 93.0 94.0 96.9 95.3 96.1  PLT 322 298 285 294 320   Cardiac Enzymes: No results for input(s): "CKTOTAL", "CKMB", "CKMBINDEX", "TROPONINI" in the last 168 hours. BNP: Invalid input(s): "POCBNP" CBG: No results for input(s): "GLUCAP" in the last 168 hours. D-Dimer No results for input(s): "DDIMER" in the last 72 hours. Hgb A1c No results for input(s): "HGBA1C" in the last 72 hours. Lipid Profile No results for input(s): "CHOL", "HDL", "LDLCALC", "TRIG", "CHOLHDL", "LDLDIRECT" in the last 72 hours. Thyroid function studies No results for input(s): "TSH", "T4TOTAL", "T3FREE", "THYROIDAB" in the last 72 hours.  Invalid input(s): "FREET3" Anemia work up No results for input(s): "VITAMINB12", "FOLATE", "FERRITIN", "TIBC", "IRON", "RETICCTPCT" in the last 72 hours. Microbiology Recent Results (from the past 240 hour(s))  Blood Culture (routine x 2)     Status: None   Collection Time: 01/04/23  2:31 PM   Specimen: BLOOD LEFT FOREARM  Result Value Ref Range Status   Specimen Description   Final    BLOOD LEFT FOREARM Performed at [facility], [address] [place], [place] [address]    Special Requests   Final    BOTTLES DRAWN AEROBIC AND ANAEROBIC Blood Culture results may  not be optimal due to an excessive volume of blood received in culture bottles Performed at [facility], [address], [place], [place] [address]    Culture   Final    NO GROWTH 5  DAYS Performed at [facility], [address] [place], [place] [address]    Report Status 01/09/2023 FINAL  Final  Blood Culture (routine x 2)     Status: Abnormal   Collection Time: 01/04/23  2:38 PM   Specimen: BLOOD RIGHT WRIST  Result Value Ref Range Status   Specimen Description   Final    BLOOD RIGHT WRIST Performed at [facility], [address] [place], [place] [address]    Special Requests   Final    BOTTLES DRAWN AEROBIC ONLY Blood Culture results may not be optimal due to an inadequate volume of blood received in culture bottles Performed at [facility], [address] [place], [place] [address]    Culture  Setup Time   Final    GRAM POSITIVE COCCI IN CLUSTERS AEROBIC BOTTLE ONLY CRITICAL RESULT CALLED TO, READ BACK BY AND VERIFIED WITH: PHARMD MICHELLE LILLISTON ON 01/05/23 @ 1911 BY DRT    Culture (A)  Final    STAPHYLOCOCCUS HOMINIS THE SIGNIFICANCE OF ISOLATING THIS ORGANISM FROM A SINGLE SET OF BLOOD CULTURES WHEN MULTIPLE SETS ARE DRAWN IS UNCERTAIN. PLEASE NOTIFY THE MICROBIOLOGY DEPARTMENT WITHIN ONE WEEK IF SPECIATION AND SENSITIVITIES ARE REQUIRED. Performed at [facility], [address] [place], [place] [address]    Report Status 01/06/2023 FINAL  Final  Blood Culture ID Panel (Reflexed)     Status: Abnormal   Collection Time: 01/04/23  2:38 PM  Result Value Ref Range Status   Enterococcus faecalis NOT DETECTED NOT DETECTED Final   Enterococcus Faecium NOT DETECTED NOT DETECTED Final   Listeria monocytogenes NOT DETECTED NOT DETECTED Final   Staphylococcus species DETECTED (A) NOT DETECTED Final    Comment: CRITICAL RESULT CALLED TO, READ BACK BY AND VERIFIED WITH: PHARMD MICHELLE LILLISTON ON 01/05/23 @ 1911 BY DRT    Staphylococcus aureus (BCID) NOT DETECTED NOT DETECTED Final   Staphylococcus epidermidis NOT DETECTED NOT DETECTED Final   Staphylococcus lugdunensis NOT DETECTED NOT DETECTED Final   Streptococcus  species NOT DETECTED NOT DETECTED Final   Streptococcus agalactiae NOT DETECTED NOT DETECTED Final   Streptococcus pneumoniae NOT DETECTED NOT DETECTED Final   Streptococcus pyogenes NOT DETECTED NOT DETECTED Final   A.calcoaceticus-baumannii NOT DETECTED NOT DETECTED Final   Bacteroides fragilis NOT DETECTED NOT DETECTED Final   Enterobacterales NOT DETECTED NOT DETECTED Final   Enterobacter cloacae complex NOT DETECTED NOT DETECTED Final   Escherichia coli NOT DETECTED NOT DETECTED Final   Klebsiella aerogenes NOT DETECTED NOT DETECTED Final   Klebsiella oxytoca NOT DETECTED NOT DETECTED Final   Klebsiella pneumoniae NOT DETECTED NOT DETECTED Final   Proteus species NOT DETECTED NOT DETECTED Final   Salmonella species NOT DETECTED NOT DETECTED Final   Serratia marcescens NOT DETECTED NOT DETECTED Final   Haemophilus influenzae NOT DETECTED NOT DETECTED Final   Neisseria meningitidis NOT DETECTED NOT DETECTED Final   Pseudomonas aeruginosa NOT DETECTED NOT DETECTED Final   Stenotrophomonas maltophilia NOT DETECTED NOT DETECTED Final   Candida albicans NOT DETECTED NOT DETECTED Final   Candida auris NOT DETECTED NOT DETECTED Final   Candida glabrata NOT DETECTED NOT DETECTED Final   Candida krusei NOT DETECTED NOT DETECTED Final   Candida  parapsilosis NOT DETECTED NOT DETECTED Final   Candida tropicalis NOT DETECTED NOT DETECTED Final   Cryptococcus neoformans/gattii NOT DETECTED NOT DETECTED Final    Comment: Performed at [facility], [address], [place], [place] [address]  Resp panel by RT-PCR (RSV, Flu A&B, Covid) Anterior Nasal Swab     Status: None   Collection Time: 01/04/23  2:43 PM   Specimen: Anterior Nasal Swab  Result Value Ref Range Status   SARS Coronavirus 2 by RT PCR NEGATIVE NEGATIVE Final    Comment: (NOTE) SARS-CoV-2 target nucleic acids are NOT DETECTED.  The SARS-CoV-2 RNA is generally detectable in upper respiratory specimens during the acute phase  of infection. The lowest concentration of SARS-CoV-2 viral copies this assay can detect is 138 copies/mL. A negative result does not preclude SARS-Cov-2 infection and should not be used as the sole basis for treatment or other patient management decisions. A negative result may occur with  improper specimen collection/handling, submission of specimen other than nasopharyngeal swab, presence of viral mutation(s) within the areas targeted by this assay, and inadequate number of viral copies(<138 copies/mL). A negative result must be combined with clinical observations, patient history, and epidemiological information. The expected result is Negative.  Fact Sheet for Patients:  BloggerCourse.com  Fact Sheet for Healthcare Providers:  SeriousBroker.it  This test is no t yet approved or cleared by the [place] FDA and  has been authorized for detection and/or diagnosis of SARS-CoV-2 by FDA under an Emergency Use Authorization (EUA). This EUA will remain  in effect (meaning this test can be used) for the duration of the COVID-19 declaration under Section 564(b)(1) of the Act, 21 U.S.C.section 360bbb-3(b)(1), unless the authorization is terminated  or revoked sooner.       Influenza A by PCR NEGATIVE NEGATIVE Final   Influenza B by PCR NEGATIVE NEGATIVE Final    Comment: (NOTE) The Xpert Xpress SARS-CoV-2/FLU/RSV plus assay is intended as an aid in the diagnosis of influenza from Nasopharyngeal swab specimens and should not be used as a sole basis for treatment. Nasal washings and aspirates are unacceptable for Xpert Xpress SARS-CoV-2/FLU/RSV testing.  Fact Sheet for Patients: BloggerCourse.com  Fact Sheet for Healthcare Providers: SeriousBroker.it  This test is not yet approved or cleared by the [place] FDA and has been authorized for detection and/or diagnosis of  SARS-CoV-2 by FDA under an Emergency Use Authorization (EUA). This EUA will remain in effect (meaning this test can be used) for the duration of the COVID-19 declaration under Section 564(b)(1) of the Act, 21 U.S.C. section 360bbb-3(b)(1), unless the authorization is terminated or revoked.     Resp Syncytial Virus by PCR NEGATIVE NEGATIVE Final    Comment: (NOTE) Fact Sheet for Patients: BloggerCourse.com  Fact Sheet for Healthcare Providers: SeriousBroker.it  This test is not yet approved or cleared by the [place] FDA and has been authorized for detection and/or diagnosis of SARS-CoV-2 by FDA under an Emergency Use Authorization (EUA). This EUA will remain in effect (meaning this test can be used) for the duration of the COVID-19 declaration under Section 564(b)(1) of the Act, 21 U.S.C. section 360bbb-3(b)(1), unless the authorization is terminated or revoked.  Performed at [facility], [address] [place], [place] [address]     Time coordinating discharge: 45 minutes  Signed: Melina Schools Shawan Corella  [facility] 01/12/2023, 1:55 PM

## 2023-01-12 NOTE — Progress Notes (Signed)
Pharmacy Antibiotic Note  Albert Perez is a 72 y.o. male admitted on 01/04/2023 with  Empyema. I .  Pharmacy has been consulted for Vanco, Zosyn dosing.  ID: Empyema. Immunocompromised - WBC 11.6 up, Scr <1, Tmax 99.4  Antimicrobials this admission: 7/7 vancomycin >>  7/7 Zosyn >>   Microbiology results: 7/2 BCx: 1/3 Staph hominis 7/2 BCx: Neg 7/2 resp panel: negative   Plan: Zosyn 3.375 gr IV q8h EI  Vancomycin 1250 mg IV q24h ( AUC 509, SCr rounded up 0.8 mg/dl, wt 34.7 kg)  No plans to check levels  Plan home with hospice today with PCA.    Height: 5\' 7"  (170.2 cm) Weight: 56.8 kg (125 lb 3.2 oz) IBW/kg (Calculated) : 66.1  Temp (24hrs), Avg:99 F (37.2 C), Min:98.3 F (36.8 C), Max:99.4 F (37.4 C)  Recent Labs  Lab 01/06/23 0444 01/07/23 0458 01/08/23 0456 01/09/23 0533 01/10/23 0406  WBC 10.0 9.5 9.9 10.6* 11.6*  CREATININE 0.65 0.60* 0.47* 0.54* 0.51*    Estimated Creatinine Clearance: 67.1 mL/min (A) (by C-G formula based on SCr of 0.51 mg/dL (L)).    No Known Allergies   [name] [name], PharmD, BCPS Clinical Staff Pharmacist [email or web address] Pasty Spillers 01/12/2023 8:14 AM

## 2023-01-12 NOTE — TOC Transition Note (Signed)
Transition of Care [facility]) - CM/SW Discharge Note   Patient Details  Name: MAK BONNY MRN: [id number] Date of Birth: Mar 05, 1951  Transition of Care [facility]) CM/SW Contact:  Howell Rucks, RN Phone Number: 01/12/2023, 10:53 AM   Clinical Narrative:  Pt dc home today with home hospice through [facility], spouse [name]) notified, will meet pt at home. [facility] called. No further TOC needs identified.       Final next level of care: Home w Hospice Care Barriers to Discharge: Barriers Resolved   Patient Goals and CMS Choice CMS Medicare.gov Compare Post Acute Care list provided to:: Patient Choice offered to / list presented to : Patient  Discharge Placement                  Patient to be transferred to facility by: [facility] Name of family member notified: [name] (spouse) Patient and family notified of of transfer: 01/12/23  Discharge Plan and Services Additional resources added to the After Visit Summary for                              [facility] Agency: [facility] Date [facility] Agency Contacted: 01/11/23 Time HH Agency Contacted: 1205 Representative spoke with at [facility] Agency: Danford Bad  Social Determinants of Health (SDOH) Interventions SDOH Screenings   Food Insecurity: No Food Insecurity (01/04/2023)  Housing: Low Risk  (01/04/2023)  Transportation Needs: No Transportation Needs (01/05/2023)  Recent Concern: Transportation Needs - Unmet Transportation Needs (01/04/2023)  Utilities: Not At Risk (01/04/2023)  Tobacco Use: High Risk (01/06/2023)     Readmission Risk Interventions    01/05/2023   12:43 PM  Readmission Risk Prevention Plan  Transportation Screening Complete  PCP or Specialist Appt within 5-7 Days Complete  Home Care Screening Complete  Medication Review (RN CM) Complete

## 2023-01-12 NOTE — Progress Notes (Signed)
Pt's SpO2 kept dropping during night time,reaching as low as 68% on 2L [place] while sleeping.But pt did not complained of SOB.  RN increased O2 to 5L and pt can maintain SpO2 above 91% while sleeping.  Pt is alert and oriented and RN changed dressing per order.

## 2023-01-12 NOTE — Progress Notes (Signed)
Daily Progress Note   Patient Name: Albert Perez       Date: 01/12/2023 DOB: December 11, 1950  Age: 72 y.o. MRN#: [id number] Attending Physician: Lorin Glass, MD Primary Care Physician: Henrine Screws, MD Admit Date: 01/04/2023 Length of Stay: 7 days  Reason for Consultation/Follow-up: Establishing goals of care and symptom management  Subjective:   CC: Patient is awake alert, resting in bed, remains on PCA and is asking about discharge home  following up regarding complex medical decision making and symptom management.  Subjective:  EMR prior to presenting to bedside.  PCA needs noted,   Plan remains for discharge home on PCA-prescription completed Patient has had PICC line placement prior to discharge home with hospice services with continuation of PCA  All questions answered at that time.  Noted palliative medicine team will continue to follow along with patient's medical journey.   Updated IDT after visit with patient to coordinate care.  Review of Systems Pain improved Objective:   Vital Signs:  BP 103/82 (BP Location: Left Wrist)   Pulse (!) 109   Temp 99.3 F (37.4 C) (Oral)   Resp 19   Ht 5\' 7"  (1.702 m)   Wt 56.8 kg   SpO2 92%   BMI 19.61 kg/m   Physical Exam: General: NAD, alert, pleasant, frail, cachectic  Eyes: No drainage noted HENT: Dry mucous membranes Cardiovascular: RRR Respiratory: no increased work of breathing noted, not in respiratory distress Abdomen: not distended Extremities: Muscle wasting present in all extremities Skin: Multiple ecchymoses on upper extremities bilaterally; wound reviewed in EMR pictures Neuro: A&Ox4, following commands easily Psych: appropriately answers all questions  Imaging:  I personally reviewed recent imaging.   Assessment & Plan:   Assessment: Patient is a 72 year old male with a past medical history of atrial fibrillation on apixaban, DVT, alcohol abuse, OSA not on CPAP, vertigo, and metastatic non-small cell  lung cancer with multiple sites of bony involvement who was admitted on 01/04/2023 for management of hypotension, decreased appetite, and multiple wounds that were oozing blood.  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  Palliative medicine team consulted to assist with complex medical decision making and symptom management. Of note patient seen by outpatient palliative medicine team at [facility].  Recommendations/Plan: # Complex medical decision making/goals of care:   -Patient transitioned to comfort care on 7/8 after meeting with patient and his wife. Patient and wife planning to go home with hospice support via [facility]. Will discontinue lab work. Continue abx for comfort and should be discharged on oral abx regimen to allow for comfortable time at home.                 - Code Status: DNR  Prognosis: weeks   # Symptom management                -Pain/dyspnea, in the setting of metastatic NSCLC                               -Discontinue oral opioids as no longer managing patient's pain management at this time.                                -Increase IV morphine  PCA to 5mg  bolus q 10 minus.  Continue to adjust based on patient's symptom burden. Discussed with patient and will hold on starting continuous infusion at this time. Ordered for PICC line placement so can go home with PCA. [facility] noted could support this.   # Psycho-social/Spiritual Support:  - Support System: wife  # Discharge Planning: Home with Hospice via [facility] once care coordinated (equipment delivered to house, PCA ordered, PICC in place).   Discussed with: patient, patient's wife, TOC, [facility] liaison, hospitalist Thank you for allowing the palliative care team to participate in the care Alejandra Adela Ports.  MOD MDM Rosalin Hawking,  MD Palliative Care Provider PMT # [phone]  If patient remains symptomatic despite maximum doses, please call PMT at [phone] between 0700 and 1900. Outside of these hours, please call attending, as PMT does not have night coverage.  *Please note that this is a verbal dictation therefore any spelling or grammatical errors are due to the "[employer]" system interpretation.

## 2023-01-13 NOTE — Progress Notes (Signed)
Pts PCA syringe empty at discharge. Verified by other RN on floor.

## 2023-01-18 ENCOUNTER — Other Ambulatory Visit: Payer: Medicare Other

## 2023-01-18 ENCOUNTER — Ambulatory Visit: Payer: Medicare Other | Admitting: Physician Assistant

## 2023-01-28 ENCOUNTER — Other Ambulatory Visit (HOSPITAL_COMMUNITY): Payer: Self-pay

## 2023-01-28 ENCOUNTER — Other Ambulatory Visit: Payer: Self-pay

## 2023-02-03 DEATH — deceased

## 2023-02-13 ENCOUNTER — Other Ambulatory Visit: Payer: Self-pay | Admitting: Student

## 2023-02-17 ENCOUNTER — Encounter: Payer: Self-pay | Admitting: Internal Medicine

## 2023-02-18 ENCOUNTER — Encounter: Payer: Self-pay | Admitting: Internal Medicine

## 2023-03-09 ENCOUNTER — Other Ambulatory Visit (HOSPITAL_COMMUNITY): Payer: Self-pay
